# Patient Record
Sex: Male | Born: 1965 | Race: White | Hispanic: No | Marital: Single | Smoking: Never smoker
Health system: Southern US, Community
[De-identification: ages and names within clinical notes are randomized; demographics above are authoritative.]

## PROBLEM LIST (undated history)

## (undated) DIAGNOSIS — Z87898 Personal history of other specified conditions: Secondary | ICD-10-CM

## (undated) DIAGNOSIS — G4700 Insomnia, unspecified: Secondary | ICD-10-CM

## (undated) DIAGNOSIS — R51 Headache: Secondary | ICD-10-CM

## (undated) DIAGNOSIS — R519 Headache, unspecified: Secondary | ICD-10-CM

## (undated) DIAGNOSIS — K219 Gastro-esophageal reflux disease without esophagitis: Secondary | ICD-10-CM

## (undated) DIAGNOSIS — S022XXA Fracture of nasal bones, initial encounter for closed fracture: Secondary | ICD-10-CM

## (undated) DIAGNOSIS — E119 Type 2 diabetes mellitus without complications: Secondary | ICD-10-CM

## (undated) DIAGNOSIS — J309 Allergic rhinitis, unspecified: Secondary | ICD-10-CM

## (undated) DIAGNOSIS — E785 Hyperlipidemia, unspecified: Secondary | ICD-10-CM

## (undated) DIAGNOSIS — K439 Ventral hernia without obstruction or gangrene: Secondary | ICD-10-CM

## (undated) DIAGNOSIS — N400 Enlarged prostate without lower urinary tract symptoms: Secondary | ICD-10-CM

## (undated) DIAGNOSIS — R42 Dizziness and giddiness: Secondary | ICD-10-CM

## (undated) DIAGNOSIS — H7191 Unspecified cholesteatoma, right ear: Secondary | ICD-10-CM

## (undated) DIAGNOSIS — H6690 Otitis media, unspecified, unspecified ear: Secondary | ICD-10-CM

## (undated) DIAGNOSIS — R41840 Attention and concentration deficit: Secondary | ICD-10-CM

## (undated) DIAGNOSIS — R300 Dysuria: Secondary | ICD-10-CM

## (undated) DIAGNOSIS — Z87448 Personal history of other diseases of urinary system: Secondary | ICD-10-CM

## (undated) DIAGNOSIS — F419 Anxiety disorder, unspecified: Secondary | ICD-10-CM

## (undated) DIAGNOSIS — Z87442 Personal history of urinary calculi: Secondary | ICD-10-CM

## (undated) DIAGNOSIS — M1990 Unspecified osteoarthritis, unspecified site: Secondary | ICD-10-CM

## (undated) DIAGNOSIS — Z87828 Personal history of other (healed) physical injury and trauma: Secondary | ICD-10-CM

## (undated) DIAGNOSIS — R55 Syncope and collapse: Secondary | ICD-10-CM

## (undated) DIAGNOSIS — H9190 Unspecified hearing loss, unspecified ear: Secondary | ICD-10-CM

## (undated) DIAGNOSIS — I10 Essential (primary) hypertension: Secondary | ICD-10-CM

## (undated) DIAGNOSIS — F410 Panic disorder [episodic paroxysmal anxiety] without agoraphobia: Secondary | ICD-10-CM

## (undated) HISTORY — PX: MASTOIDECTOMY: SHX711

## (undated) HISTORY — DX: Personal history of other (healed) physical injury and trauma: Z87.828

## (undated) HISTORY — DX: Type 2 diabetes mellitus without complications: E11.9

## (undated) HISTORY — DX: Unspecified cholesteatoma, right ear: H71.91

## (undated) HISTORY — DX: Essential (primary) hypertension: I10

## (undated) HISTORY — PX: NOSE SURGERY: SHX723

## (undated) HISTORY — PX: INNER EAR SURGERY: SHX679

## (undated) HISTORY — PX: CHOLECYSTECTOMY: SHX55

## (undated) HISTORY — DX: Gastro-esophageal reflux disease without esophagitis: K21.9

## (undated) HISTORY — PX: LUMBAR DISC SURGERY: SHX700

## (undated) HISTORY — DX: Hyperlipidemia, unspecified: E78.5

## (undated) HISTORY — PX: OTHER SURGICAL HISTORY: SHX169

## (undated) HISTORY — PX: UPPER GASTROINTESTINAL ENDOSCOPY: SHX188

## (undated) HISTORY — DX: Anxiety disorder, unspecified: F41.9

---
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
## 1999-01-22 ENCOUNTER — Inpatient Hospital Stay (HOSPITAL_COMMUNITY): Admission: EM | Admit: 1999-01-22 | Discharge: 1999-01-24 | Payer: Self-pay | Admitting: *Deleted

## 2000-11-02 ENCOUNTER — Emergency Department (HOSPITAL_COMMUNITY): Admission: EM | Admit: 2000-11-02 | Discharge: 2000-11-03 | Payer: Self-pay | Admitting: *Deleted

## 2000-11-02 ENCOUNTER — Encounter: Payer: Self-pay | Admitting: *Deleted

## 2001-09-29 ENCOUNTER — Encounter: Payer: Self-pay | Admitting: Internal Medicine

## 2001-09-29 ENCOUNTER — Emergency Department (HOSPITAL_COMMUNITY): Admission: EM | Admit: 2001-09-29 | Discharge: 2001-09-29 | Payer: Self-pay | Admitting: Internal Medicine

## 2002-07-25 ENCOUNTER — Encounter: Payer: Self-pay | Admitting: *Deleted

## 2002-07-25 ENCOUNTER — Emergency Department (HOSPITAL_COMMUNITY): Admission: EM | Admit: 2002-07-25 | Discharge: 2002-07-25 | Payer: Self-pay | Admitting: *Deleted

## 2003-05-28 ENCOUNTER — Emergency Department (HOSPITAL_COMMUNITY): Admission: EM | Admit: 2003-05-28 | Discharge: 2003-05-28 | Payer: Self-pay | Admitting: *Deleted

## 2004-02-28 ENCOUNTER — Emergency Department (HOSPITAL_COMMUNITY): Admission: EM | Admit: 2004-02-28 | Discharge: 2004-02-28 | Payer: Self-pay | Admitting: Emergency Medicine

## 2004-03-26 ENCOUNTER — Emergency Department (HOSPITAL_COMMUNITY): Admission: AC | Admit: 2004-03-26 | Discharge: 2004-03-26 | Payer: Self-pay | Admitting: Emergency Medicine

## 2004-04-22 ENCOUNTER — Emergency Department (HOSPITAL_COMMUNITY): Admission: EM | Admit: 2004-04-22 | Discharge: 2004-04-22 | Payer: Self-pay | Admitting: Emergency Medicine

## 2004-08-13 ENCOUNTER — Emergency Department (HOSPITAL_COMMUNITY): Admission: EM | Admit: 2004-08-13 | Discharge: 2004-08-14 | Payer: Self-pay | Admitting: Emergency Medicine

## 2004-09-23 ENCOUNTER — Emergency Department (HOSPITAL_COMMUNITY): Admission: EM | Admit: 2004-09-23 | Discharge: 2004-09-24 | Payer: Self-pay | Admitting: *Deleted

## 2004-11-07 ENCOUNTER — Emergency Department (HOSPITAL_COMMUNITY): Admission: EM | Admit: 2004-11-07 | Discharge: 2004-11-07 | Payer: Self-pay | Admitting: Emergency Medicine

## 2004-12-31 ENCOUNTER — Emergency Department (HOSPITAL_COMMUNITY): Admission: EM | Admit: 2004-12-31 | Discharge: 2004-12-31 | Payer: Self-pay | Admitting: Emergency Medicine

## 2005-03-02 ENCOUNTER — Ambulatory Visit (HOSPITAL_COMMUNITY): Payer: Self-pay | Admitting: Psychiatry

## 2005-03-02 ENCOUNTER — Ambulatory Visit: Payer: Self-pay | Admitting: Psychiatry

## 2005-04-30 ENCOUNTER — Emergency Department (HOSPITAL_COMMUNITY): Admission: EM | Admit: 2005-04-30 | Discharge: 2005-04-30 | Payer: Self-pay | Admitting: Emergency Medicine

## 2005-10-27 ENCOUNTER — Observation Stay (HOSPITAL_COMMUNITY): Admission: AD | Admit: 2005-10-27 | Discharge: 2005-10-28 | Payer: Self-pay | Admitting: Cardiology

## 2005-10-27 ENCOUNTER — Ambulatory Visit: Payer: Self-pay | Admitting: Cardiology

## 2005-10-27 ENCOUNTER — Ambulatory Visit: Payer: Self-pay | Admitting: Cardiovascular Disease

## 2005-11-04 ENCOUNTER — Encounter: Payer: Self-pay | Admitting: Vascular Surgery

## 2005-11-04 ENCOUNTER — Emergency Department (HOSPITAL_COMMUNITY): Admission: EM | Admit: 2005-11-04 | Discharge: 2005-11-04 | Payer: Self-pay | Admitting: Emergency Medicine

## 2006-05-07 ENCOUNTER — Emergency Department (HOSPITAL_COMMUNITY): Admission: EM | Admit: 2006-05-07 | Discharge: 2006-05-07 | Payer: Self-pay | Admitting: Emergency Medicine

## 2006-07-29 ENCOUNTER — Emergency Department (HOSPITAL_COMMUNITY): Admission: EM | Admit: 2006-07-29 | Discharge: 2006-07-29 | Payer: Self-pay | Admitting: Emergency Medicine

## 2006-08-01 ENCOUNTER — Emergency Department (HOSPITAL_COMMUNITY): Admission: EM | Admit: 2006-08-01 | Discharge: 2006-08-01 | Payer: Self-pay | Admitting: Emergency Medicine

## 2006-08-27 ENCOUNTER — Emergency Department (HOSPITAL_COMMUNITY): Admission: EM | Admit: 2006-08-27 | Discharge: 2006-08-27 | Payer: Self-pay | Admitting: Emergency Medicine

## 2006-08-31 ENCOUNTER — Emergency Department (HOSPITAL_COMMUNITY): Admission: EM | Admit: 2006-08-31 | Discharge: 2006-08-31 | Payer: Self-pay | Admitting: Emergency Medicine

## 2006-11-23 ENCOUNTER — Emergency Department (HOSPITAL_COMMUNITY): Admission: EM | Admit: 2006-11-23 | Discharge: 2006-11-23 | Payer: Self-pay | Admitting: Emergency Medicine

## 2007-01-12 ENCOUNTER — Emergency Department (HOSPITAL_COMMUNITY): Admission: EM | Admit: 2007-01-12 | Discharge: 2007-01-12 | Payer: Self-pay | Admitting: Emergency Medicine

## 2007-01-18 ENCOUNTER — Emergency Department (HOSPITAL_COMMUNITY): Admission: EM | Admit: 2007-01-18 | Discharge: 2007-01-18 | Payer: Self-pay | Admitting: Emergency Medicine

## 2007-03-12 ENCOUNTER — Ambulatory Visit (HOSPITAL_COMMUNITY): Admission: RE | Admit: 2007-03-12 | Discharge: 2007-03-12 | Payer: Self-pay | Admitting: Orthopaedic Surgery

## 2007-08-27 ENCOUNTER — Emergency Department (HOSPITAL_COMMUNITY): Admission: EM | Admit: 2007-08-27 | Discharge: 2007-08-27 | Payer: Self-pay | Admitting: Emergency Medicine

## 2008-01-25 ENCOUNTER — Emergency Department (HOSPITAL_COMMUNITY): Admission: EM | Admit: 2008-01-25 | Discharge: 2008-01-25 | Payer: Self-pay | Admitting: Emergency Medicine

## 2008-02-28 ENCOUNTER — Emergency Department (HOSPITAL_COMMUNITY): Admission: EM | Admit: 2008-02-28 | Discharge: 2008-02-28 | Payer: Self-pay | Admitting: Emergency Medicine

## 2008-04-10 ENCOUNTER — Emergency Department (HOSPITAL_COMMUNITY): Admission: EM | Admit: 2008-04-10 | Discharge: 2008-04-10 | Payer: Self-pay | Admitting: Emergency Medicine

## 2008-06-10 ENCOUNTER — Emergency Department (HOSPITAL_COMMUNITY): Admission: EM | Admit: 2008-06-10 | Discharge: 2008-06-10 | Payer: Self-pay | Admitting: Emergency Medicine

## 2008-07-05 ENCOUNTER — Ambulatory Visit (HOSPITAL_COMMUNITY): Admission: RE | Admit: 2008-07-05 | Discharge: 2008-07-05 | Payer: Self-pay | Admitting: Internal Medicine

## 2008-07-20 ENCOUNTER — Emergency Department (HOSPITAL_COMMUNITY): Admission: EM | Admit: 2008-07-20 | Discharge: 2008-07-20 | Payer: Self-pay | Admitting: Emergency Medicine

## 2008-09-07 ENCOUNTER — Emergency Department (HOSPITAL_COMMUNITY): Admission: EM | Admit: 2008-09-07 | Discharge: 2008-09-07 | Payer: Self-pay | Admitting: Emergency Medicine

## 2008-11-14 ENCOUNTER — Emergency Department (HOSPITAL_COMMUNITY): Admission: EM | Admit: 2008-11-14 | Discharge: 2008-11-14 | Payer: Self-pay | Admitting: Emergency Medicine

## 2008-12-28 ENCOUNTER — Emergency Department (HOSPITAL_COMMUNITY): Admission: EM | Admit: 2008-12-28 | Discharge: 2008-12-28 | Payer: Self-pay | Admitting: Emergency Medicine

## 2009-01-25 ENCOUNTER — Emergency Department (HOSPITAL_COMMUNITY): Admission: EM | Admit: 2009-01-25 | Discharge: 2009-01-25 | Payer: Self-pay | Admitting: Emergency Medicine

## 2009-06-28 ENCOUNTER — Emergency Department (HOSPITAL_COMMUNITY): Admission: EM | Admit: 2009-06-28 | Discharge: 2009-06-28 | Payer: Self-pay | Admitting: Emergency Medicine

## 2009-07-08 ENCOUNTER — Ambulatory Visit (HOSPITAL_COMMUNITY): Admission: RE | Admit: 2009-07-08 | Discharge: 2009-07-08 | Payer: Self-pay | Admitting: Family Medicine

## 2010-01-25 ENCOUNTER — Ambulatory Visit (HOSPITAL_COMMUNITY): Admission: RE | Admit: 2010-01-25 | Discharge: 2010-01-25 | Payer: Self-pay | Admitting: Orthopedic Surgery

## 2010-03-03 ENCOUNTER — Emergency Department (HOSPITAL_COMMUNITY)
Admission: EM | Admit: 2010-03-03 | Discharge: 2010-03-03 | Payer: Self-pay | Source: Home / Self Care | Admitting: Emergency Medicine

## 2010-07-05 ENCOUNTER — Emergency Department (HOSPITAL_COMMUNITY)
Admission: EM | Admit: 2010-07-05 | Discharge: 2010-07-06 | Disposition: A | Discharge status: Home / Self Care | Payer: 59 | Attending: Emergency Medicine | Admitting: Emergency Medicine

## 2010-07-05 DIAGNOSIS — E7800 Pure hypercholesterolemia, unspecified: Secondary | ICD-10-CM | POA: Insufficient documentation

## 2010-07-05 DIAGNOSIS — K219 Gastro-esophageal reflux disease without esophagitis: Secondary | ICD-10-CM | POA: Insufficient documentation

## 2010-07-05 DIAGNOSIS — R55 Syncope and collapse: Secondary | ICD-10-CM | POA: Insufficient documentation

## 2010-07-05 LAB — POCT I-STAT, CHEM 8
Calcium, Ion: 1.11 mmol/L — ABNORMAL LOW (ref 1.12–1.32)
Chloride: 110 mEq/L (ref 96–112)
Glucose, Bld: 187 mg/dL — ABNORMAL HIGH (ref 70–99)
HCT: 47 % (ref 39.0–52.0)
TCO2: 23 mmol/L (ref 0–100)

## 2010-07-05 LAB — BASIC METABOLIC PANEL
BUN: 8 mg/dL (ref 6–23)
CO2: 28 mEq/L (ref 19–32)
Calcium: 9.3 mg/dL (ref 8.4–10.5)
Chloride: 107 mEq/L (ref 96–112)
Creatinine, Ser: 1.24 mg/dL (ref 0.4–1.5)
GFR calc Af Amer: >60 mL/min (ref >60)
GFR calc non Af Amer: >60 mL/min (ref >60)
Glucose, Bld: 174 mg/dL — ABNORMAL HIGH (ref 70–99)
Potassium: 3.8 mEq/L (ref 3.5–5.1)
Sodium: 139 mEq/L (ref 135–145)

## 2010-07-05 LAB — DIFFERENTIAL
Basophils Absolute: 0 10*3/uL (ref 0.0–0.1)
Basophils Relative: 0 % (ref 0–1)
Eosinophils Absolute: 0.1 10*3/uL (ref 0.0–0.7)
Eosinophils Relative: 1 % (ref 0–5)
Lymphocytes Relative: 28 % (ref 12–46)
Lymphs Abs: 1.3 10*3/uL (ref 0.7–4.0)
Monocytes Absolute: 0.3 10*3/uL (ref 0.1–1.0)
Monocytes Relative: 7 % (ref 3–12)
Neutro Abs: 2.9 10*3/uL (ref 1.7–7.7)
Neutrophils Relative %: 63 % (ref 43–77)

## 2010-07-05 LAB — CBC
HCT: 44.8 % (ref 39.0–52.0)
HCT: 45.8 % (ref 39.0–52.0)
Hemoglobin: 16.1 g/dL (ref 13.0–17.0)
Hemoglobin: 16.2 g/dL (ref 13.0–17.0)
MCHC: 35.3 g/dL (ref 30.0–36.0)
MCHC: 35.9 g/dL (ref 30.0–36.0)
MCV: 83.6 fL (ref 78.0–100.0)
MCV: 84.7 fL (ref 78.0–100.0)
Platelets: 174 10*3/uL (ref 150–400)
RBC: 5.41 MIL/uL (ref 4.22–5.81)
RDW: 12.4 % (ref 11.5–15.5)
RDW: 12.8 % (ref 11.5–15.5)
WBC: 4.7 10*3/uL (ref 4.0–10.5)

## 2010-07-05 LAB — POCT CARDIAC MARKERS
CKMB, poc: <1 ng/mL — ABNORMAL LOW (ref 1.0–8.0)
CKMB, poc: <1 ng/mL — ABNORMAL LOW (ref 1.0–8.0)
Myoglobin, poc: 68.4 ng/mL (ref 12–200)
Myoglobin, poc: 90 ng/mL (ref 12–200)
Troponin i, poc: <0.05 ng/mL (ref 0.00–0.09)
Troponin i, poc: <0.05 ng/mL (ref 0.00–0.09)
Troponin i, poc: <0.05 ng/mL (ref 0.00–0.09)

## 2010-07-06 LAB — BASIC METABOLIC PANEL
BUN: 15 mg/dL (ref 6–23)
CO2: 24 mEq/L (ref 19–32)
Calcium: 8.7 mg/dL (ref 8.4–10.5)
Glucose, Bld: 203 mg/dL — ABNORMAL HIGH (ref 70–99)
Potassium: 4.1 mEq/L (ref 3.5–5.1)
Sodium: 141 mEq/L (ref 135–145)

## 2010-07-07 LAB — URINALYSIS, ROUTINE W REFLEX MICROSCOPIC
Hgb urine dipstick: NEGATIVE
Specific Gravity, Urine: 1.025 (ref 1.005–1.030)
pH: 6 (ref 5.0–8.0)

## 2010-07-14 LAB — PROTEIN ELECTROPH W RFLX QUANT IMMUNOGLOBULINS
Albumin ELP: 58.5 % (ref 55.8–66.1)
Alpha-1-Globulin: 3.7 % (ref 2.9–4.9)
Alpha-2-Globulin: 8.6 % (ref 7.1–11.8)
Beta 2: 5.7 % (ref 3.2–6.5)

## 2010-07-14 LAB — DIFFERENTIAL
Basophils Relative: 0 % (ref 0–1)
Eosinophils Absolute: 0.1 10*3/uL (ref 0.0–0.7)
Eosinophils Relative: 1 % (ref 0–5)
Monocytes Absolute: 0.3 10*3/uL (ref 0.1–1.0)
Monocytes Relative: 6 % (ref 3–12)

## 2010-07-14 LAB — CBC
Hemoglobin: 16.5 g/dL (ref 13.0–17.0)
MCHC: 34.4 g/dL (ref 30.0–36.0)
MCV: 84.9 fL (ref 78.0–100.0)
RBC: 5.67 MIL/uL (ref 4.22–5.81)
RDW: 13.4 % (ref 11.5–15.5)

## 2010-07-14 LAB — POCT CARDIAC MARKERS: Troponin i, poc: <0.05 ng/mL (ref 0.00–0.09)

## 2010-07-14 LAB — POCT I-STAT, CHEM 8
BUN: 9 mg/dL (ref 6–23)
Chloride: 105 mEq/L (ref 96–112)
Creatinine, Ser: 1.1 mg/dL (ref 0.4–1.5)
Glucose, Bld: 204 mg/dL — ABNORMAL HIGH (ref 70–99)
HCT: 50 % (ref 39.0–52.0)
Potassium: 4 mEq/L (ref 3.5–5.1)

## 2010-08-04 ENCOUNTER — Other Ambulatory Visit (HOSPITAL_COMMUNITY): Payer: Self-pay | Admitting: Internal Medicine

## 2010-08-04 DIAGNOSIS — R52 Pain, unspecified: Secondary | ICD-10-CM

## 2010-08-05 ENCOUNTER — Inpatient Hospital Stay (HOSPITAL_COMMUNITY): Admission: RE | Admit: 2010-08-05 | Payer: 59 | Source: Ambulatory Visit

## 2010-08-05 ENCOUNTER — Ambulatory Visit (HOSPITAL_COMMUNITY): Payer: 59

## 2010-08-05 ENCOUNTER — Ambulatory Visit (HOSPITAL_COMMUNITY)
Admission: RE | Admit: 2010-08-05 | Discharge: 2010-08-05 | Disposition: A | Discharge status: Home / Self Care | Payer: 59 | Source: Ambulatory Visit | Attending: Internal Medicine | Admitting: Internal Medicine

## 2010-08-05 DIAGNOSIS — R52 Pain, unspecified: Secondary | ICD-10-CM

## 2010-08-05 DIAGNOSIS — R109 Unspecified abdominal pain: Secondary | ICD-10-CM | POA: Insufficient documentation

## 2010-08-08 ENCOUNTER — Other Ambulatory Visit (HOSPITAL_COMMUNITY): Payer: 59

## 2010-08-15 NOTE — Letter (Signed)
November 06, 2005     Dr. Fara Chute  [address]  [place], [place] [address].   RE:  [name], Jacob  MRN:  [id number]  /  DOB:  [birth year]   Dear Dr. Neita Carp:   Jacob [name] was seen in the office today.  I regret that he has had so much  trouble following a negative cardiac catheterization.  By his history, he  bled the evening after his catheterization when he was sitting down to  dinner.  The pain increased over the next day or two but did not stop him  from performing fairly strenuous activities.  When he tried to go to work a  few days ago, he had increased pain prompting an ER visit.  Vascular  ultrasound at that time was negative.  He has felt somewhat better in the  intervening two days, but continues to have significant tenderness and  discomfort with activity.   EXAM:  The weight is 178, blood pressure 120/75, heart rate 75 and regular.  In the right inguinal region, the skin puncture site is completely closed.  There is bruising extending down the thigh.  He has a tender mass with a  diameter of approximately 1-1/2 inches in the soft tissue medial to the  incision.  The arterial pulse is normal.   IMPRESSION:  Jacob [name] suffered a re-bleed from his catheterization site on  the day after the procedure was performed.  The blood tracked medially and  is now causing discomfort.  I doubt  that there will be any permanent sequelae.  He requests return to work on  Monday, which is fine with me.  He will use analgesics and moist heat to  assist with his symptoms.  I will reassess him in one week.    Sincerely,      Gerrit Friends. Dietrich Pates, MD, [facility]   RMR/MedQ  DD:  11/06/2005  DT:  11/06/2005  Job #:  [id number]

## 2010-08-15 NOTE — Cardiovascular Report (Signed)
NAMEOM, LIZOTTE NO.:  [id number]   MEDICAL RECORD NO.:  [id number]          PATIENT TYPE:  INP   LOCATION:  3728                         FACILITY:  [facility]   PHYSICIAN:  Micheline Chapman, MD   DATE OF BIRTH:  1965-10-09   DATE OF PROCEDURE:  10/27/2005  DATE OF DISCHARGE:                              CARDIAC CATHETERIZATION   PERFORMING PHYSICIAN:  Micheline Chapman, M.D.   PROCTORING PHYSICIAN:  Charlton Haws, M.D.   INDICATIONS:  Mr. Snellings is a 45 year old male with no prior cardiac history  but has a past history of diabetes, dyslipidemia and strong family history  presenting with the new onset of resting chest tightness with a borderline  troponin from the outside hospital of 0.08. He was, therefore, referred for  cardiac catheterization to definitively evaluate his coronary arteries.   PROCEDURES:  1.  Left heart catheterization.  2.  Coronary angiogram.  3.  Left ventricular angiogram.   The indications and risks of the procedure were explained fully to the  patient.  He understood, and informed consent was obtained.  He was brought  to the cardiac catheterization lab where the right groin was prepped and  draped in normal sterile fashion.  Local anesthesia with 1% lidocaine was  used.  The patient was given 2 mg Versed and 50 mcg fentanyl for conscious  sedation.  Using the modified Seldinger technique, a 6-French right femoral  arterial sheath was placed without problems.  Catheters used included a 6-  French angled pigtail, JR4 and JL4.  At the conclusion of the case, the  right femoral artery was sealed with a 6-French Angioseal closure device.   FINDINGS:  Aortic pressure was 93/68 mmHg.  Left ventricular pressure was  91/4 mmHg with an left ventricular end-diastolic pressure of 10 mmHg.   Left coronary artery: The left mainstem is normal.   LAD is a large diameter vessel that gives off a large septal perforator as  well as a large diameter  branching diagonal.  There is no angiographic  disease in the LAD.   The left circumflex gives off 3 very small obtuse marginal branches, then  gives off a large OM that has no angiographic disease.  The remainder of the  AV groove circumflex vessel has no disease.   Right coronary artery is a dominant vessel branching into a PDA and  posterior AV segment that gives off 2 posterolateral branches.  There is no  angiographic disease in the right coronary artery system.   Left ventricular systolic function is normal, and ejection fraction is  estimated at 55%.  This is by estimation from an ROA LV angiogram.   ASSESSMENT:  1.  Chest pain with normal coronary arteries.  2.  Normal left ventricular function.   PLAN:  Risk factor modification with treatment of his diabetes and  hyperlipidemia.  Plan on discharge home tomorrow.   COMPLICATIONS:  None.      Micheline Chapman, MD  Electronically Signed     MDC/MEDQ  D:  10/27/2005  T:  10/27/2005  Job:  [id number]   cc:   Fara Chute, M.D.  Willa Rough, M.D.

## 2010-08-15 NOTE — Procedures (Signed)
[facility]  [name], Jacob [name] Visit Number: [id number] MRN: [id number]          Service Type: EMS Location: ED Attending Physician:  Cassell Smiles. Dictated by:   Kari Baars, M.D. Proc. Date: 09/29/01 Admit Date:  09/29/2001 Discharge Date: 09/29/2001                            EKG Interpretations  The rhythm is a sinus rhythm with a bradycardic rate in the 50s.  Small Q-waves are seen inferiorly and clinical correlation is suggested.  Otherwise normal electrocardiogram. Dictated by:   Kari Baars, M.D. Attending Physician:  Cassell Smiles DD:  09/30/01 TD:  10/04/01 Job: [id number]

## 2010-08-15 NOTE — Discharge Summary (Signed)
NAMENYKO, GELL NO.:  [id number]   MEDICAL RECORD NO.:  [id number]          PATIENT TYPE:  INP   LOCATION:  3728                         FACILITY:  [facility]   PHYSICIAN:  Micheline Chapman, MD   DATE OF BIRTH:  13-Feb-1966   DATE OF ADMISSION:  10/27/2005  DATE OF DISCHARGE:  10/28/2005                                 DISCHARGE SUMMARY   PROCEDURES:  1.  Cardiac catheterization.  2.  Coronary arteriogram.  3.  Left ventriculogram.   TIME OF DISCHARGE:  Thirty-three minutes.   CHIEF COMPLAINT:  1.  Chest pain, clean cath this admission with an EF or 55%,.  2.  Diabetes, diet-controlled.  3.  Hyperlipidemia.  4.  Past history of coronary artery disease.  5.  Depression and panic attacks.  6.  History of right ear tumor surgeries x3.  7.  Status post lumbar laminectomy.  8.  Status post cholecystectomy.  9.  Gastroesophageal reflux disease.   HOSPITAL COURSE:  Mr. Edelson is an 45 year old male with no previous history of  coronary artery disease.  He has been under increased stress recently with  family issues.  He developed chest pain.  He presented to [facility]  and he had a borderline troponin of 0.8.  He was evaluated by Cardiology and  because of his multiple cardiac risk factors, was transferred to [facility] for further evaluation and treatment.   It was felt that cardiac catheterization was indicated to further define his  anatomy and this was performed on October 27, 2005.  His scores were normal and  his EF was normal at 555 with no wall motion abnormality.  It was felt that  he needed risk factor modification.   Mr. Simonin stated that he is to trying to wean himself off the Wellbutrin, but  he as encouraged to take it on a daily basis and follow up with Dr. Neita Carp  if his symptoms of anxiety and stress were not controlled.  He had no  further episodes of chest pain.  He stated that his hemoglobin A1c had been  approximately 6  recently, which indicates good control, but he had recently  been eating for carbohydrates and sugared sodas than he had been doing  previously.  He was encouraged to stick tightly to a diabetic diet and  continue to check his sugars and follow up with Dr. Neita Carp.   On October 28, 2005 Mr. Juniel was without chest pain or shortness of breath.  His cath site was without ecchymosis or hematoma.  He was considered stable  for discharge on October 28, 2005 and is to follow up with his primary care  physician.   DISCHARGE INSTRUCTIONS:  1.  His activity level is to be reduced for the next 2 day.  2.  He is to call our office for any problems with the cath site.  3.  He is to stick to a diabetic diet.  4.  He is to see Dr. Neita Carp within 2 weeks and see Dr. Andee Lineman on a p.r.n.  basis.   DISCHARGE MEDICATIONS:  1.  Wellbutrin 150 mg daily.  2.  Xanax p.r.n.  3.  Prilosec OTC one to two tablets b.i.d. for a week and then one tab      daily.  (The patient has done well on Nexium in the past, but is having      difficulty affording this).      Theodore Demark, P.A. [facility]      Micheline Chapman, MD  Electronically Signed    RB/MEDQ  D:  10/28/2005  T:  10/28/2005  Job:  [id number]   cc:   Sabino Donovan [facility]

## 2010-12-24 LAB — BASIC METABOLIC PANEL
BUN: 12
Creatinine, Ser: 1.29
GFR calc Af Amer: >60
GFR calc non Af Amer: >60
Potassium: 4.7

## 2010-12-24 LAB — CBC
HCT: 45
Platelets: 201
WBC: 6.7

## 2010-12-24 LAB — POCT CARDIAC MARKERS
Myoglobin, poc: 60.3
Operator id: 282261
Operator id: 282261
Troponin i, poc: <0.05

## 2010-12-24 LAB — DIFFERENTIAL
Lymphocytes Relative: 17
Lymphs Abs: 1.1
Neutrophils Relative %: 75

## 2010-12-24 LAB — D-DIMER, QUANTITATIVE: D-Dimer, Quant: 0.22

## 2010-12-29 LAB — COMPREHENSIVE METABOLIC PANEL
Albumin: 3.9
BUN: 9
Creatinine, Ser: 1.12
Total Bilirubin: 1
Total Protein: 7

## 2010-12-29 LAB — DIFFERENTIAL
Basophils Absolute: 0
Lymphocytes Relative: 24
Monocytes Absolute: 0.2
Monocytes Relative: 5
Neutro Abs: 3

## 2010-12-29 LAB — CBC
HCT: 48.3
MCV: 84.4
Platelets: 192
RDW: 13.4

## 2010-12-29 LAB — POCT CARDIAC MARKERS: Troponin i, poc: <0.05

## 2011-01-07 LAB — CBC
HCT: 44.3
MCV: 84.3
RBC: 5.25
WBC: 4.3

## 2011-01-07 LAB — DIFFERENTIAL
Eosinophils Absolute: 0.1
Eosinophils Relative: 1
Lymphs Abs: 1.1
Monocytes Relative: 9

## 2011-01-07 LAB — BASIC METABOLIC PANEL
Chloride: 108
GFR calc Af Amer: >60
Potassium: 3.6

## 2011-01-07 LAB — PROTIME-INR: Prothrombin Time: 13.7

## 2011-01-09 LAB — CBC
Hemoglobin: 16
MCHC: 34.3
RBC: 5.52

## 2011-01-09 LAB — DIFFERENTIAL
Eosinophils Relative: 1
Lymphocytes Relative: 26
Monocytes Absolute: 0.5
Monocytes Relative: 7
Neutrophils Relative %: 66

## 2011-01-09 LAB — COMPREHENSIVE METABOLIC PANEL
ALT: 30
Alkaline Phosphatase: 73
CO2: 30
Calcium: 9.3
Chloride: 109
GFR calc non Af Amer: >60
Glucose, Bld: 140 — ABNORMAL HIGH
Sodium: 141
Total Bilirubin: 1

## 2011-01-09 LAB — POCT CARDIAC MARKERS
CKMB, poc: 2
Troponin i, poc: <0.05

## 2011-01-15 LAB — RAPID STREP SCREEN (MED CTR MEBANE ONLY): Streptococcus, Group A Screen (Direct): NEGATIVE

## 2011-01-15 LAB — STREP A DNA PROBE

## 2011-01-22 ENCOUNTER — Emergency Department (HOSPITAL_COMMUNITY): Payer: Self-pay

## 2011-01-22 ENCOUNTER — Emergency Department (HOSPITAL_COMMUNITY)
Admission: EM | Admit: 2011-01-22 | Discharge: 2011-01-23 | Disposition: A | Discharge status: Home / Self Care | Payer: Self-pay | Attending: Emergency Medicine | Admitting: Emergency Medicine

## 2011-01-22 DIAGNOSIS — R6883 Chills (without fever): Secondary | ICD-10-CM | POA: Insufficient documentation

## 2011-01-22 DIAGNOSIS — E7800 Pure hypercholesterolemia, unspecified: Secondary | ICD-10-CM | POA: Insufficient documentation

## 2011-01-22 DIAGNOSIS — Z79899 Other long term (current) drug therapy: Secondary | ICD-10-CM | POA: Insufficient documentation

## 2011-01-22 DIAGNOSIS — K219 Gastro-esophageal reflux disease without esophagitis: Secondary | ICD-10-CM | POA: Insufficient documentation

## 2011-01-22 DIAGNOSIS — G8929 Other chronic pain: Secondary | ICD-10-CM | POA: Insufficient documentation

## 2011-01-22 DIAGNOSIS — R110 Nausea: Secondary | ICD-10-CM | POA: Insufficient documentation

## 2011-01-22 DIAGNOSIS — R059 Cough, unspecified: Secondary | ICD-10-CM | POA: Insufficient documentation

## 2011-01-22 DIAGNOSIS — R079 Chest pain, unspecified: Secondary | ICD-10-CM | POA: Insufficient documentation

## 2011-01-22 DIAGNOSIS — M549 Dorsalgia, unspecified: Secondary | ICD-10-CM | POA: Insufficient documentation

## 2011-01-22 DIAGNOSIS — R05 Cough: Secondary | ICD-10-CM | POA: Insufficient documentation

## 2011-01-22 LAB — POCT I-STAT, CHEM 8
Calcium, Ion: 1.16 mmol/L (ref 1.12–1.32)
Creatinine, Ser: 1.3 mg/dL (ref 0.50–1.35)
Glucose, Bld: 148 mg/dL — ABNORMAL HIGH (ref 70–99)
Hemoglobin: 16.3 g/dL (ref 13.0–17.0)
TCO2: 25 mmol/L (ref 0–100)

## 2011-01-23 LAB — POCT I-STAT TROPONIN I: Troponin i, poc: 0 ng/mL (ref 0.00–0.08)

## 2011-01-25 IMAGING — CR DG ORBITS FOR FOREIGN BODY
1 series · 1 of 1 positions shown · non-contrast
Comparison: CT 06/28/2009

CLINICAL DATA: Metal working/exposure; clearance prior to MRI

ORBITS FOR FOREIGN BODY - 2 VIEW

[view not recorded]
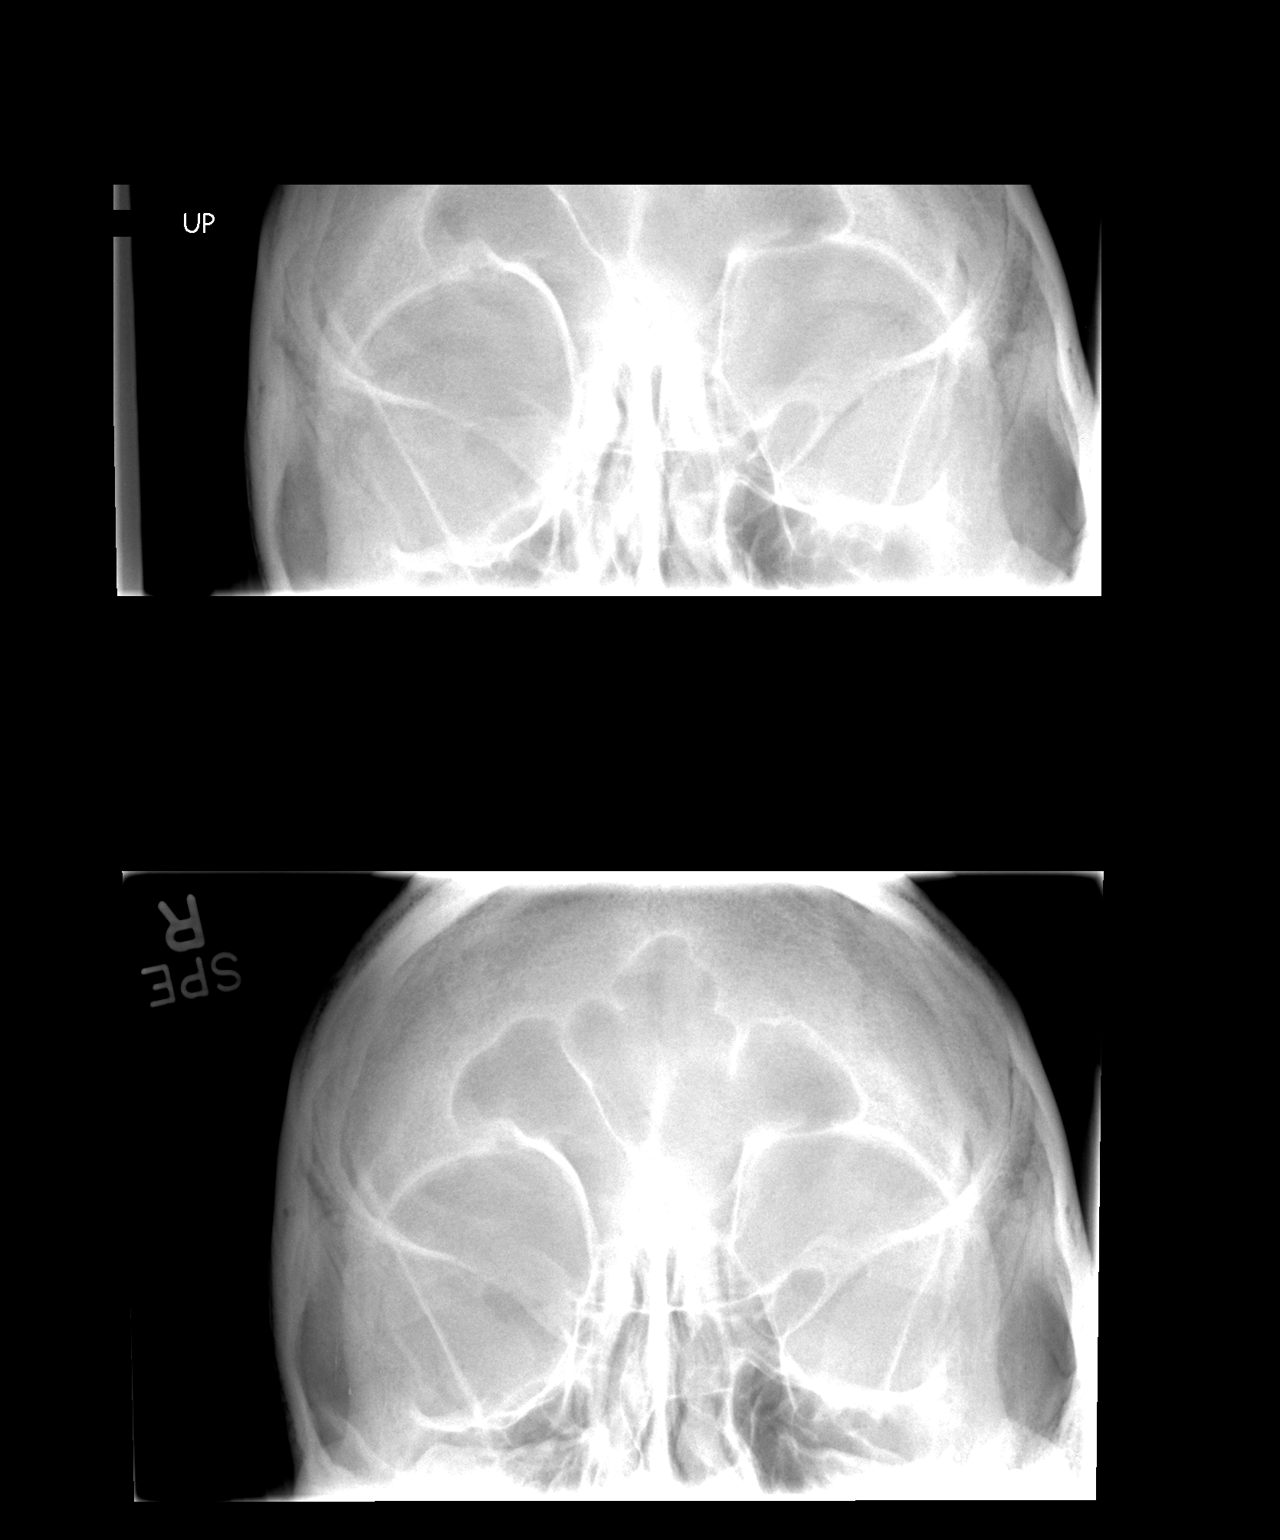

[1 of 1 positions shown; findings below may reference images not displayed]

FINDINGS: There is no evidence of metallic foreign body within the
orbits.  No significant bone abnormality identified.
IMPRESSION: No evidence of metallic foreign body within the orbits.

## 2011-10-21 ENCOUNTER — Ambulatory Visit (INDEPENDENT_AMBULATORY_CARE_PROVIDER_SITE_OTHER): Payer: 59 | Admitting: Family Medicine

## 2011-10-21 VITALS — BP 124/66 | HR 62 | Temp 97.5°F | Resp 16 | Ht 70.5 in | Wt 187.0 lb

## 2011-10-21 DIAGNOSIS — H60399 Other infective otitis externa, unspecified ear: Secondary | ICD-10-CM

## 2011-10-21 DIAGNOSIS — R42 Dizziness and giddiness: Secondary | ICD-10-CM

## 2011-10-21 DIAGNOSIS — R51 Headache: Secondary | ICD-10-CM

## 2011-10-21 DIAGNOSIS — H6090 Unspecified otitis externa, unspecified ear: Secondary | ICD-10-CM

## 2011-10-21 DIAGNOSIS — K219 Gastro-esophageal reflux disease without esophagitis: Secondary | ICD-10-CM

## 2011-10-21 MED ORDER — CIPROFLOXACIN-DEXAMETHASONE 0.3-0.1 % OT SUSP: 4.0000 [drp] | Freq: Two times a day (BID) | OTIC | Status: AC

## 2011-10-21 MED ORDER — BUTALBITAL-APAP-CAFFEINE 50-325-40 MG PO TABS: 1.0000 | ORAL_TABLET | Freq: Four times a day (QID) | ORAL | Status: DC | PRN
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
## 2011-11-14 ENCOUNTER — Ambulatory Visit (INDEPENDENT_AMBULATORY_CARE_PROVIDER_SITE_OTHER): Payer: 59 | Admitting: Emergency Medicine

## 2011-11-14 VITALS — BP 138/82 | HR 61 | Temp 97.9°F | Resp 16 | Ht 71.5 in | Wt 189.0 lb

## 2011-11-14 DIAGNOSIS — R109 Unspecified abdominal pain: Secondary | ICD-10-CM

## 2011-11-14 DIAGNOSIS — R1310 Dysphagia, unspecified: Secondary | ICD-10-CM

## 2011-11-14 DIAGNOSIS — S335XXA Sprain of ligaments of lumbar spine, initial encounter: Secondary | ICD-10-CM

## 2011-11-14 DIAGNOSIS — H66009 Acute suppurative otitis media without spontaneous rupture of ear drum, unspecified ear: Secondary | ICD-10-CM

## 2011-11-14 LAB — POCT UA - MICROSCOPIC ONLY
Bacteria, U Microscopic: NEGATIVE
Crystals, Ur, HPF, POC: NEGATIVE
Mucus, UA: NEGATIVE
RBC, urine, microscopic: NEGATIVE

## 2011-11-14 MED ORDER — CELECOXIB 200 MG PO CAPS: 200.0000 mg | ORAL_CAPSULE | Freq: Two times a day (BID) | ORAL | Status: AC

## 2011-11-14 MED ORDER — SUCRALFATE 1 G PO TABS: 1.0000 g | ORAL_TABLET | Freq: Four times a day (QID) | ORAL | Status: DC

## 2011-11-14 MED ORDER — AMOXICILLIN 875 MG PO TABS: 875.0000 mg | ORAL_TABLET | Freq: Two times a day (BID) | ORAL | Status: AC

## 2011-11-14 MED ORDER — CYCLOBENZAPRINE HCL 10 MG PO TABS: 10.0000 mg | ORAL_TABLET | Freq: Three times a day (TID) | ORAL | Status: AC | PRN

## 2011-11-14 MED ORDER — LANSOPRAZOLE 30 MG PO CPDR: 30.0000 mg | DELAYED_RELEASE_CAPSULE | Freq: Every day | ORAL | Status: DC

## 2011-11-14 NOTE — Progress Notes (Signed)
Date:  11/14/2011   Name:  Jacob [name]   DOB:  1965/07/17   MRN:  [id number] Gender: male  Age: 46 y.o.  PCP:  No primary provider on file.    Chief Complaint: Groin Pain, Otalgia and Sore Throat   History of Present Illness:  Jacob [name] is a 46 y.o. pleasant patient who presents with the following:  Multiple complaints.  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  Has right flank/low back pain over past week. No history of injury.  Radiates into his right groin.  No dysuria, hematuria, nausea or vomiting.  History of prior kidney stone and laminectomy.  No fever or chills.  No stool change.    Complains of pain in his left ear over past few days associated with sore throat.  No URI symptoms, cough, fever or chills.  Some dysphagia.  History of right surgery for cholesteatoma.  Has reflux esophagitis and only sporadically takes his PPI as he can't afford it daily.  Has noticed increasing symptoms of pain and waterbrash.  Says that she has increasing difficulty swallowing his food feels as though it is sticking in his throat  There is no problem list on file for this patient.   Past Medical History  Diagnosis Date  . Cholesteatoma of right ear   . Anxiety   . GERD (gastroesophageal reflux disease)     Past Surgical History  Procedure Date  . External ear surgery   . Spine surgery     History  Substance Use Topics  . Smoking status: Never Smoker   . Smokeless tobacco: Not on file  . Alcohol Use: Yes    Family History  Problem Relation Age of Onset  . COPD Mother   . Diabetes Mother   . Hypertension Mother   . Heart disease Father     No Known Allergies  Medication list has been reviewed and updated.  Current Outpatient Prescriptions on File Prior to Visit  Medication Sig Dispense Refill  . ALPRAZolam (XANAX) 0.5 MG tablet Take 0.5 mg by mouth at bedtime as needed.      Marland Kitchen esomeprazole (NEXIUM) 40 MG capsule  Take 40 mg by mouth as needed.      . sildenafil (VIAGRA) 50 MG tablet Take 50 mg by mouth daily as needed.      . butalbital-acetaminophen-caffeine (FIORICET) 50-325-40 MG per tablet Take 1-2 tablets by mouth every 6 (six) hours as needed for headache.  30 tablet  0    Review of Systems:  As per HPI, otherwise negative.    Physical Examination: Filed Vitals:   11/14/11 1645  BP: 138/82  Pulse: 61  Temp: 97.9 F (36.6 C)  Resp: 16   Filed Vitals:   11/14/11 1645  Height: 5' 11.5" (1.816 m)  Weight: 189 lb (85.73 kg)   Body mass index is 25.99 kg/(m^2). Ideal Body Weight: Weight in (lb) to have BMI = 25: 181.4   GEN: WDWN, NAD, Non-toxic, A & O x 3 HEENT: Atraumatic, Normocephalic. Neck supple. No masses, No LAD.  Throat red, no injection or exudate Ears and Nose: No external deformity.  Left TM dull and red.   CV: RRR, No M/G/R. No JVD. No thrill. No extra heart sounds. PULM: CTA B, no wheezes, crackles, rhonchi. No retractions. No resp. distress. No accessory muscle use. ABD: S, NT, ND, +BS. No rebound. No HSM. EXTR: No c/c/e NEURO  Normal gait.  PSYCH: Normally interactive. Conversant. Not depressed or anxious appearing.  Calm demeanor.  Back:  No cva tenderness, tender in right lumbar region  Assessment and Plan: Otitis media:  Amoxicillin Lumbar strain:  celebrex and flexeril Reflux esophagitis:  Prevacid, carafate, GI consult   \  Follow up as needed  Carmelina Dane, MD

## 2011-12-02 ENCOUNTER — Encounter: Payer: Self-pay | Admitting: Internal Medicine

## 2011-12-24 ENCOUNTER — Emergency Department (HOSPITAL_COMMUNITY): Payer: Worker's Compensation

## 2011-12-24 ENCOUNTER — Encounter (HOSPITAL_COMMUNITY): Payer: Self-pay | Admitting: Emergency Medicine

## 2011-12-24 ENCOUNTER — Emergency Department (HOSPITAL_COMMUNITY)
Admission: EM | Admit: 2011-12-24 | Discharge: 2011-12-24 | Disposition: A | Discharge status: Home / Self Care | Payer: Worker's Compensation | Attending: Emergency Medicine | Admitting: Emergency Medicine

## 2011-12-24 DIAGNOSIS — S060XAA Concussion with loss of consciousness status unknown, initial encounter: Secondary | ICD-10-CM | POA: Insufficient documentation

## 2011-12-24 DIAGNOSIS — Y9289 Other specified places as the place of occurrence of the external cause: Secondary | ICD-10-CM | POA: Insufficient documentation

## 2011-12-24 DIAGNOSIS — M25561 Pain in right knee: Secondary | ICD-10-CM

## 2011-12-24 DIAGNOSIS — W11XXXA Fall on and from ladder, initial encounter: Secondary | ICD-10-CM | POA: Insufficient documentation

## 2011-12-24 DIAGNOSIS — S060X9A Concussion with loss of consciousness of unspecified duration, initial encounter: Secondary | ICD-10-CM | POA: Insufficient documentation

## 2011-12-24 DIAGNOSIS — M25569 Pain in unspecified knee: Secondary | ICD-10-CM | POA: Insufficient documentation

## 2011-12-24 DIAGNOSIS — M542 Cervicalgia: Secondary | ICD-10-CM | POA: Insufficient documentation

## 2011-12-24 LAB — GLUCOSE, CAPILLARY: Glucose-Capillary: 137 mg/dL — ABNORMAL HIGH (ref 70–99)

## 2011-12-24 MED ORDER — IBUPROFEN 600 MG PO TABS: 600.0000 mg | ORAL_TABLET | Freq: Three times a day (TID) | ORAL | Status: DC | PRN

## 2011-12-24 MED ORDER — MORPHINE SULFATE 4 MG/ML IJ SOLN
6.0000 mg | Freq: Once | INTRAMUSCULAR | Status: AC
  Administered 2011-12-24: 4 mg via INTRAVENOUS
  Filled 2011-12-24: qty 1

## 2011-12-24 NOTE — ED Notes (Signed)
Pt states he took his xanax .25 tab when he got back from CT scan, states he had a loose pill in his pocket and was so anxious that " i couldn't stand it". RN asked pt if he had any more medications and pt states no. RN checked pt pockets and no other pills found.

## 2011-12-24 NOTE — ED Notes (Signed)
EMD at bedside.

## 2011-12-24 NOTE — ED Notes (Signed)
Dr. Debbe Odea was given the patient's EKG.

## 2011-12-24 NOTE — ED Notes (Signed)
Patient ambulated 28 feet with no assistance.

## 2011-12-24 NOTE — ED Provider Notes (Addendum)
History     CSN: [id number]  Arrival date & time 12/24/11  1026   First MD Initiated Contact with Patient 12/24/11 1031      Chief Complaint  Patient presents with  . Fall  . Loss of Consciousness     The history is provided by the patient.  pt reports falling from his ladder while at work approximately 13 week.  He reports striking the right side of his forehead.  He lost consciousness.  His had no nausea or vomiting since this.  He ambulated into the emergency department.  He was placed in a c-collar on triage.  He reports headache and mild neck pain.  He denies weakness of his upper lower extremities.  He has no abdominal pain.  Reports pain in his present in his right knee.  He has no numbness or tingling.  His pain is mild to moderate in severity at this time.  Past Medical History  Diagnosis Date  . Cholesteatoma of right ear   . Anxiety   . GERD (gastroesophageal reflux disease)     Past Surgical History  Procedure Date  . External ear surgery   . Spine surgery     Family History  Problem Relation Age of Onset  . COPD Mother   . Diabetes Mother   . Hypertension Mother   . Heart disease Father     History  Substance Use Topics  . Smoking status: Never Smoker   . Smokeless tobacco: Not on file  . Alcohol Use: Yes      Review of Systems  All other systems reviewed and are negative.    Allergies  Review of patient's allergies indicates no known allergies.  Home Medications   Current Outpatient Rx  Name Route Sig Dispense Refill  . ALPRAZOLAM 0.5 MG PO TABS Oral Take 0.25-0.5 mg by mouth at bedtime as needed. For sleep.    Marland Kitchen ESOMEPRAZOLE MAGNESIUM 40 MG PO CPDR Oral Take 40 mg by mouth daily as needed. For heartburn.      BP 158/129  Pulse 79  Temp 97.8 F (36.6 C) (Oral)  Resp 19  SpO2 100%  Physical Exam  Nursing note and vitals reviewed. Constitutional: He is oriented to person, place, and time. He appears well-developed and  well-nourished. Cervical collar in place.  HENT:  Head: Normocephalic and atraumatic.       Hematoma right forehead, small, without laceration  Eyes: EOM are normal. Pupils are equal, round, and reactive to light.  Neck: Neck supple. Spinous process tenderness and muscular tenderness present.  Cardiovascular: Normal rate, regular rhythm, normal heart sounds and intact distal pulses.   Pulmonary/Chest: Effort normal and breath sounds normal. No respiratory distress.  Abdominal: Soft. He exhibits no distension. There is no tenderness.  Musculoskeletal: Normal range of motion.       Mild pain with palpation of right knee lateral joint line. Normal pulses right foot. Full ROM of right knee and right hip  Neurological: He is alert and oriented to person, place, and time.  Skin: Skin is warm and dry.  Psychiatric: He has a normal mood and affect. Judgment normal.    ED Course  Procedures (including critical care time)  Labs Reviewed  GLUCOSE, CAPILLARY - Abnormal; Notable for the following:    Glucose-Capillary 137 (*)     All other components within normal limits   Dg Chest 1 View  12/24/2011  *RADIOLOGY REPORT*  Clinical Data: Syncope.  Fall.  Hypertension.  CHEST -  1 VIEW  Comparison: 01/22/2011  Findings: Heart size is normal.  Both lungs are clear.  No evidence of pleural effusion.  No mass or lymphadenopathy identified.  IMPRESSION: Stable exam.  No active disease.   Original Report Authenticated By: Danae Orleans, M.D.    Ct Head Wo Contrast  12/24/2011  *RADIOLOGY REPORT*  Clinical Data:  Fall, seizure, pain.  CT HEAD WITHOUT CONTRAST CT CERVICAL SPINE WITHOUT CONTRAST  Technique:  Multidetector CT imaging of the head and cervical spine was performed following the standard protocol without intravenous contrast.  Multiplanar CT image reconstructions of the cervical spine were also generated.  Comparison:  07/08/2009  CT HEAD  Findings: No acute intracranial abnormality.  Specifically, no  hemorrhage, hydrocephalus, mass lesion, acute infarction, or significant intracranial injury.  No acute calvarial abnormality. Visualized paranasal sinuses and mastoids clear.  Orbital soft tissues unremarkable.  IMPRESSION: Normal study  CT CERVICAL SPINE  Findings: Normal alignment.  Disc spaces are maintained. Prevertebral soft tissues are normal.  No fracture.  No epidural or paraspinal hematoma.  IMPRESSION: No acute bony abnormality.   Original Report Authenticated By: Cyndie Chime, M.D.    Ct Cervical Spine Wo Contrast  12/24/2011  *RADIOLOGY REPORT*  Clinical Data:  Fall, seizure, pain.  CT HEAD WITHOUT CONTRAST CT CERVICAL SPINE WITHOUT CONTRAST  Technique:  Multidetector CT imaging of the head and cervical spine was performed following the standard protocol without intravenous contrast.  Multiplanar CT image reconstructions of the cervical spine were also generated.  Comparison:  07/08/2009  CT HEAD  Findings: No acute intracranial abnormality.  Specifically, no hemorrhage, hydrocephalus, mass lesion, acute infarction, or significant intracranial injury.  No acute calvarial abnormality. Visualized paranasal sinuses and mastoids clear.  Orbital soft tissues unremarkable.  IMPRESSION: Normal study  CT CERVICAL SPINE  Findings: Normal alignment.  Disc spaces are maintained. Prevertebral soft tissues are normal.  No fracture.  No epidural or paraspinal hematoma.  IMPRESSION: No acute bony abnormality.   Original Report Authenticated By: Cyndie Chime, M.D.    Dg Knee Complete 4 Views Right  12/24/2011  *RADIOLOGY REPORT*  Clinical Data: Fall, loss of consciousness, knee pain.  RIGHT KNEE - COMPLETE 4+ VIEW  Comparison: None  Findings: No acute bony abnormality.  Specifically, no fracture, subluxation, or dislocation.  Soft tissues are intact.  No joint effusion.  IMPRESSION: No bony abnormality.   Original Report Authenticated By: Cyndie Chime, M.D.     I personally reviewed the imaging tests  through PACS system  I reviewed available ER/hospitalization records thought the EMR   1. Concussion   2. Right knee pain     Date: 12/24/2011  Rate: 62  Rhythm: normal sinus rhythm  QRS Axis: normal  Intervals: normal  ST/T Wave abnormalities: normal  Conduction Disutrbances: none  Narrative Interpretation:   Old EKG Reviewed: No significant changes noted      MDM  No fracture. No radiographic abnormalities. nml mental status. Will ambulate. 5/5 strength throughout upper and lower extremity major muscle groups.   12:24 PM Ambulated around ER without any difficulty        Lyanne Co, MD 12/24/11 1224  Lyanne Co, MD 12/24/11 1531

## 2011-12-24 NOTE — ED Notes (Signed)
Pt presenting to ed with c/o fall approximately 13 feet off of a ladder pt states he had positive loc. Pt ambulated into hospital without difficulty. Pt placed in c-collar in triage. Pt is alert and oriented at this time pt able to move all extremities

## 2011-12-24 NOTE — ED Notes (Signed)
Pt made aware that he cannot drive. Ride at pts bedside

## 2011-12-24 NOTE — ED Notes (Signed)
Patient transported to CT 

## 2011-12-29 ENCOUNTER — Encounter: Payer: Self-pay | Admitting: Internal Medicine

## 2011-12-30 ENCOUNTER — Encounter: Payer: Self-pay | Admitting: Internal Medicine

## 2011-12-30 ENCOUNTER — Telehealth: Payer: Self-pay | Admitting: Internal Medicine

## 2011-12-30 ENCOUNTER — Ambulatory Visit (INDEPENDENT_AMBULATORY_CARE_PROVIDER_SITE_OTHER): Payer: 59 | Admitting: Internal Medicine

## 2011-12-30 VITALS — BP 126/70 | HR 80 | Ht 70.75 in | Wt 189.2 lb

## 2011-12-30 DIAGNOSIS — K3189 Other diseases of stomach and duodenum: Secondary | ICD-10-CM

## 2011-12-30 DIAGNOSIS — R1310 Dysphagia, unspecified: Secondary | ICD-10-CM

## 2011-12-30 DIAGNOSIS — R1013 Epigastric pain: Secondary | ICD-10-CM | POA: Insufficient documentation

## 2011-12-30 DIAGNOSIS — K219 Gastro-esophageal reflux disease without esophagitis: Secondary | ICD-10-CM | POA: Insufficient documentation

## 2011-12-30 MED ORDER — ESOMEPRAZOLE MAGNESIUM 40 MG PO CPDR: 40.0000 mg | DELAYED_RELEASE_CAPSULE | Freq: Every day | ORAL | Status: DC | PRN

## 2011-12-30 NOTE — Progress Notes (Signed)
Patient ID: Jacob [name], male   DOB: 1966-02-16, 46 y.o.   MRN: [id number]  SUBJECTIVE: HPI Jacob [name] is a 46 year old male with a past medical history of borderline diabetes, GERD, and anxiety who is seen in consultation at the request of Dr. Regino Schultze for evaluation of GERD with dysphagia.  He was initially seen by Dr. Jeani Hawking who recommended EGD, but due to insurance purposes, EGD was not performed. The patient reports a long-standing history of GERD which was treated initially with as needed over-the-counter antacids. He subsequently developed solid food dysphagia, particularly to meat and bread. He also reports several issues of intermittent "choking", where he states food gets stuck and he did be regurgitated back up. He does report occasional nausea, mild epigastric discomfort, but no vomiting.   He has been on Nexium 40 mg daily, and this has significantly helped his heartburn, and to some degree his dysphagia. He does report initially being nonadherent Nexium, partially due to to financial reasons.  He denies melena or bright red blood per rectum. No change in bowel habits. No lower abdominal pain. No weight loss. No fevers or chills.   Review of Systems  As per history of present illness, otherwise negative   Past Medical History  Diagnosis Date  . Cholesteatoma of right ear   . Anxiety   . GERD (gastroesophageal reflux disease)   . Diabetes mellitus     bordreline    Current Outpatient Prescriptions  Medication Sig Dispense Refill  . ALPRAZolam (XANAX) 0.5 MG tablet Take 0.25-0.5 mg by mouth at bedtime as needed. For sleep.      Marland Kitchen esomeprazole (NEXIUM) 40 MG capsule Take 1 capsule (40 mg total) by mouth daily as needed. For heartburn.  90 capsule  3  . ibuprofen (ADVIL,MOTRIN) 600 MG tablet Take 1 tablet (600 mg total) by mouth every 8 (eight) hours as needed for pain.  15 tablet  0  . sucralfate (CARAFATE) 1 G tablet Take 1 g by mouth daily.        No Known Allergies  Family  History  Problem Relation Age of Onset  . COPD Mother   . Hypertension Mother   . Heart disease Father   . Lung cancer Maternal Uncle   . Diabetes Paternal Grandmother   . Diabetes Maternal Grandmother     History  Substance Use Topics  . Smoking status: Never Smoker   . Smokeless tobacco: Never Used  . Alcohol Use: Yes     social    OBJECTIVE: BP 126/70  Pulse 80  Ht 5' 10.75" (1.797 m)  Wt 189 lb 4 oz (85.843 kg)  BMI 26.58 kg/m2 Constitutional: Well-developed and well-nourished. No distress. HEENT: Normocephalic and atraumatic. Oropharynx is clear and moist. No oropharyngeal exudate. Conjunctivae are normal. Pupils are equal round and reactive to light. No scleral icterus. Cardiovascular: Normal rate, regular rhythm and intact distal pulses. No M/R/G Pulmonary/chest: Effort normal and breath sounds normal. No wheezing, rales or rhonchi. Abdominal: Soft, nontender, nondistended. Bowel sounds active throughout. There are no masses palpable. Extremities: no clubbing, cyanosis, or edema Neurological: Alert and oriented to person place and time. Skin: Skin is warm and dry. No rashes noted. Psychiatric: Normal mood and affect. Behavior is normal.  ASSESSMENT AND PLAN: 46 year old male with a past medical history of borderline diabetes, GERD, and anxiety who is seen in consultation at the request of Dr. Regino Schultze for evaluation of GERD with dysphagia.  1.  GERD with dysphagia -- the patient  does have classic symptoms of GERD and a large portion of the symptoms have responded to PPI. He is still having some solid food dysphagia, and I recommended direct visualization with EGD. Dilation could be performed at this time if necessary. His symptoms of dysphagia, could be related to erosive esophagitis. Given his mild epigastric discomfort, I would also like to rule out gastritis and H. pylori disease.  I recommend he continue Nexium 40 mg daily, and he is given refills. Further  recommendations to be made after endoscopy.

## 2011-12-30 NOTE — Patient Instructions (Addendum)
You have been scheduled for an endoscopy with propofol. Please follow written instructions given to you at your visit today. If you use inhalers (even only as needed), please bring them with you on the day of your procedure.  We have sent the following medications to your pharmacy for you to pick up at your convenience: nexium

## 2011-12-31 NOTE — Telephone Encounter (Signed)
I spoke with Mr. Lagrave, and he was wondering if he was going to be "loopy all day tomorrow."  I explained the propofol to him, and that he would be awake when he left the building.  I also told him that he couldn't drive nor have any alcohol tomorrow.  He asked about Saturday night and was pleased to know that he could have alcohol at that time.   Patient understands that he should follow instructions both before and after the procedure.

## 2012-01-01 ENCOUNTER — Ambulatory Visit (AMBULATORY_SURGERY_CENTER): Payer: 59 | Admitting: Internal Medicine

## 2012-01-01 ENCOUNTER — Encounter: Payer: Self-pay | Admitting: Internal Medicine

## 2012-01-01 VITALS — BP 139/94 | HR 57 | Temp 97.6°F | Resp 14 | Ht 70.5 in | Wt 189.0 lb

## 2012-01-01 DIAGNOSIS — R1013 Epigastric pain: Secondary | ICD-10-CM

## 2012-01-01 DIAGNOSIS — R1310 Dysphagia, unspecified: Secondary | ICD-10-CM

## 2012-01-01 DIAGNOSIS — D131 Benign neoplasm of stomach: Secondary | ICD-10-CM

## 2012-01-01 DIAGNOSIS — K219 Gastro-esophageal reflux disease without esophagitis: Secondary | ICD-10-CM

## 2012-01-01 MED ORDER — SODIUM CHLORIDE 0.9 % IV SOLN: 500.0000 mL | INTRAVENOUS | Status: DC

## 2012-01-01 NOTE — Patient Instructions (Addendum)
YOU HAD AN ENDOSCOPIC PROCEDURE TODAY AT THE [facility]: Refer to the procedure report that was given to you for any specific questions about what was found during the examination.  If the procedure report does not answer your questions, please call your gastroenterologist to clarify.  If you requested that your care partner not be given the details of your procedure findings, then the procedure report has been included in a sealed envelope for you to review at your convenience later.  YOU SHOULD EXPECT: Some feelings of bloating in the abdomen. Passage of more gas than usual.  Walking can help get rid of the air that was put into your GI tract during the procedure and reduce the bloating. If you had a lower endoscopy (such as a colonoscopy or flexible sigmoidoscopy) you may notice spotting of blood in your stool or on the toilet paper. If you underwent a bowel prep for your procedure, then you may not have a normal bowel movement for a few days.  DIET: Your first meal following the procedure should be a light meal and then it is ok to progress to your normal diet.  A half-sandwich or bowl of soup is an example of a good first meal.  Heavy or fried foods are harder to digest and may make you feel nauseous or bloated.  Likewise meals heavy in dairy and vegetables can cause extra gas to form and this can also increase the bloating.  Drink plenty of fluids but you should avoid alcoholic beverages for 24 hours.  ACTIVITY: Your care partner should take you home directly after the procedure.  You should plan to take it easy, moving slowly for the rest of the day.  You can resume normal activity the day after the procedure however you should NOT DRIVE or use heavy machinery for 24 hours (because of the sedation medicines used during the test).    SYMPTOMS TO REPORT IMMEDIATELY: A gastroenterologist can be reached at any hour.  During normal business hours, 8:30 AM to 5:00 PM Monday through Friday,  call [phone].  After hours and on weekends, please call the GI answering service at [phone] who will take a message and have the physician on call contact you.    Following upper endoscopy (EGD)  Vomiting of blood or coffee ground material  New chest pain or pain under the shoulder blades  Painful or persistently difficult swallowing  New shortness of breath  Fever of 100F or higher  Black, tarry-looking stools  FOLLOW UP: If any biopsies were taken you will be contacted by phone or by letter within the next 1-3 weeks.  Call your gastroenterologist if you have not heard about the biopsies in 3 weeks.  Our staff will call the home number listed on your records the next business day following your procedure to check on you and address any questions or concerns that you may have at that time regarding the information given to you following your procedure. This is a courtesy call and so if there is no answer at the home number and we have not heard from you through the emergency physician on call, we will assume that you have returned to your regular daily activities without incident.  SIGNATURES/CONFIDENTIALITY: You and/or your care partner have signed paperwork which will be entered into your electronic medical record.  These signatures attest to the fact that that the information above on your After Visit Summary has been reviewed and is understood.  Full   responsibility of the confidentiality of this discharge information lies with you and/or your care-partner.  Gastritis information given.    Advised per Dr. Rhea Belton that he did not need to follow the post dilation diet because he was well dilated.

## 2012-01-01 NOTE — Op Note (Signed)
[facility] [address] [place], [address]   ENDOSCOPY PROCEDURE REPORT  PATIENT: Jacob, [name]  MR#: [id number] BIRTHDATE: [birth year] , 45  yrs. old GENDER: Male ENDOSCOPIST: Beverley Fiedler, MD REFERRED BY:  Karleen Hampshire PROCEDURE DATE:  01/01/2012 PROCEDURE:  EGD w/ biopsy and Savary dilation of esophagus ASA CLASS:     Class II INDICATIONS:  history of GERD.   dysphagia. MEDICATIONS: MAC sedation, administered by CRNA and propofol (Diprivan) 200mg  IV TOPICAL ANESTHETIC: none  DESCRIPTION OF PROCEDURE: After the risks benefits and alternatives of the procedure were thoroughly explained, informed consent was obtained.  The LB GIF-H180 [id number] endoscope was introduced through the mouth and advanced to the second portion of the duodenum. Without limitations.  The instrument was slowly withdrawn as the mucosa was fully examined.   ESOPHAGUS: The mucosa of the esophagus appeared normal.   Empiric dilation was performed with Savary over a wire, 18 mm (one pass, without heme on dilator)  STOMACH: Mild gastritis (inflammation) was found in the gastric antrum.  Biopsies were taken in the antrum and angularis.   Stomach otherwise unremarkable.  DUODENUM: The duodenal mucosa showed no abnormalities in the bulb and second portion of the duodenum. The scope was then withdrawn from the patient and the procedure completed.  COMPLICATIONS: There were no complications. ENDOSCOPIC IMPRESSION: 1.   Normal esophagus.  Empiric dilation performed with 18 mm Savary. 2.   Distal gastritis, biopsies to exclude H. Pylori. 3.   Normal duodenum  RECOMMENDATIONS: 1.  Await pathology results 2.  Follow-up of helicobacter pylori status, treat if indicated 3.  Continue taking your PPI (Nexium) once daily.  It is best to be taken 20-30 minutes prior to breakfast meal.   eSigned:  Beverley Fiedler, MD 01/01/2012 3:53 PM CC:The Patient and Karleen Hampshire, MD

## 2012-01-01 NOTE — Progress Notes (Signed)
Patient did not experience any of the following events: a burn prior to discharge; a fall within the facility; wrong site/side/patient/procedure/implant event; or a hospital transfer or hospital admission upon discharge from the facility. (G8907) Patient did not have preoperative order for IV antibiotic SSI prophylaxis. (G8918)  

## 2012-01-01 NOTE — Progress Notes (Signed)
Dr. Rhea Belton delayed at hospital.

## 2012-01-04 ENCOUNTER — Telehealth: Payer: Self-pay | Admitting: *Deleted

## 2012-01-04 NOTE — Telephone Encounter (Signed)
  Follow up Call-  Call back number 01/01/2012  Post procedure Call Back phone  # [phone] cell  Permission to leave phone message Yes     Patient questions:  Do you have a fever, pain , or abdominal swelling? no Pain Score  0 *  Have you tolerated food without any problems? yes  Have you been able to return to your normal activities? yes  Do you have any questions about your discharge instructions: Diet   no Medications  no Follow up visit  no  Do you have questions or concerns about your Care? no  Actions: * If pain score is 4 or above: No action needed, pain <4.

## 2012-01-06 ENCOUNTER — Encounter: Payer: Self-pay | Admitting: Internal Medicine

## 2012-01-07 ENCOUNTER — Other Ambulatory Visit: Payer: Self-pay | Admitting: Gastroenterology

## 2012-01-07 MED ORDER — PANTOPRAZOLE SODIUM 40 MG PO TBEC: 40.0000 mg | DELAYED_RELEASE_TABLET | Freq: Every day | ORAL | Status: DC

## 2012-01-09 HISTORY — PX: UPPER GI ENDOSCOPY: SHX6162

## 2012-01-25 LAB — HM DIABETES EYE EXAM: HM Diabetic Eye Exam: NORMAL

## 2012-08-08 ENCOUNTER — Emergency Department (HOSPITAL_COMMUNITY)
Admission: EM | Admit: 2012-08-08 | Discharge: 2012-08-08 | Disposition: A | Discharge status: Home / Self Care | Payer: Self-pay | Attending: Emergency Medicine | Admitting: Emergency Medicine

## 2012-08-08 ENCOUNTER — Emergency Department (HOSPITAL_COMMUNITY): Payer: Self-pay

## 2012-08-08 ENCOUNTER — Encounter (HOSPITAL_COMMUNITY): Payer: Self-pay | Admitting: Emergency Medicine

## 2012-08-08 DIAGNOSIS — R0602 Shortness of breath: Secondary | ICD-10-CM | POA: Insufficient documentation

## 2012-08-08 DIAGNOSIS — R0789 Other chest pain: Secondary | ICD-10-CM | POA: Insufficient documentation

## 2012-08-08 DIAGNOSIS — R1013 Epigastric pain: Secondary | ICD-10-CM

## 2012-08-08 DIAGNOSIS — R110 Nausea: Secondary | ICD-10-CM | POA: Insufficient documentation

## 2012-08-08 DIAGNOSIS — K219 Gastro-esophageal reflux disease without esophagitis: Secondary | ICD-10-CM | POA: Insufficient documentation

## 2012-08-08 DIAGNOSIS — Z79899 Other long term (current) drug therapy: Secondary | ICD-10-CM | POA: Insufficient documentation

## 2012-08-08 DIAGNOSIS — K3189 Other diseases of stomach and duodenum: Secondary | ICD-10-CM | POA: Insufficient documentation

## 2012-08-08 DIAGNOSIS — R109 Unspecified abdominal pain: Secondary | ICD-10-CM | POA: Insufficient documentation

## 2012-08-08 DIAGNOSIS — E119 Type 2 diabetes mellitus without complications: Secondary | ICD-10-CM | POA: Insufficient documentation

## 2012-08-08 DIAGNOSIS — Z8669 Personal history of other diseases of the nervous system and sense organs: Secondary | ICD-10-CM | POA: Insufficient documentation

## 2012-08-08 DIAGNOSIS — F411 Generalized anxiety disorder: Secondary | ICD-10-CM | POA: Insufficient documentation

## 2012-08-08 LAB — COMPREHENSIVE METABOLIC PANEL
ALT: 20 U/L (ref 0–53)
AST: 17 U/L (ref 0–37)
Albumin: 3.5 g/dL (ref 3.5–5.2)
CO2: 26 mEq/L (ref 19–32)
Chloride: 105 mEq/L (ref 96–112)
Creatinine, Ser: 1.06 mg/dL (ref 0.50–1.35)
GFR calc non Af Amer: 83 mL/min — ABNORMAL LOW (ref >90)
Sodium: 139 mEq/L (ref 135–145)
Total Bilirubin: 0.5 mg/dL (ref 0.3–1.2)

## 2012-08-08 LAB — CBC WITH DIFFERENTIAL/PLATELET
Basophils Absolute: 0 10*3/uL (ref 0.0–0.1)
Basophils Relative: 0 % (ref 0–1)
Lymphocytes Relative: 35 % (ref 12–46)
MCHC: 35.5 g/dL (ref 30.0–36.0)
Neutro Abs: 2.1 10*3/uL (ref 1.7–7.7)
Neutrophils Relative %: 54 % (ref 43–77)
Platelets: 148 10*3/uL — ABNORMAL LOW (ref 150–400)
RDW: 12.6 % (ref 11.5–15.5)
WBC: 3.9 10*3/uL — ABNORMAL LOW (ref 4.0–10.5)

## 2012-08-08 LAB — POCT I-STAT TROPONIN I: Troponin i, poc: 0 ng/mL (ref 0.00–0.08)

## 2012-08-08 MED ORDER — GI COCKTAIL ~~LOC~~
30.0000 mL | Freq: Once | ORAL | Status: AC
  Administered 2012-08-08: 30 mL via ORAL
  Filled 2012-08-08: qty 30

## 2012-08-08 MED ORDER — OMEPRAZOLE 20 MG PO CPDR: 20.0000 mg | DELAYED_RELEASE_CAPSULE | Freq: Every day | ORAL | Status: DC

## 2012-08-08 NOTE — ED Notes (Signed)
Pt c/o L chest pain radiating to L side, sharp in nature, +nausea, +SHOB,+diaphoresis, pt states pain awoke him @ 0030 last night. Pt took ASA 81mg  x 2 last night and ASA 81mg  x 3 this am.

## 2012-08-08 NOTE — ED Provider Notes (Addendum)
History     CSN: [id number]  Arrival date & time 08/08/12  4098   First MD Initiated Contact with Patient 08/08/12 [phone]      Chief Complaint  Patient presents with  . Chest Pain    (Consider location/radiation/quality/duration/timing/severity/associated sxs/prior treatment) Patient is a 47 y.o. male presenting with chest pain. The history is provided by the patient.  Chest Pain Pain location:  L chest Pain quality: sharp   Radiates to: to the left abd. Pain radiates to the back: no   Pain severity:  Moderate Onset quality:  Sudden Duration: states the pain last for seconds and then resolves and returns. Timing:  Sporadic Progression:  Unchanged Chronicity:  New (started last night about 1 hour after going to sleep and resolved and then returned this am) Context: eating   Context: not breathing, not lifting, no movement, not raising an arm and no trauma   Context comment:  This morning started when eating a bowel of cereal Relieved by:  Aspirin Worsened by:  Nothing tried Ineffective treatments:  None tried Associated symptoms: abdominal pain, anxiety, nausea and shortness of breath   Associated symptoms: no fever, not vomiting and no weakness   Associated symptoms comment:  States that after the pain started today he felt very anxious and had to take a xanax  Risk factors: diabetes mellitus and male sex   Risk factors: no coronary artery disease, no high cholesterol, no hypertension, no immobilization, no prior DVT/PE, no smoking and no surgery     Past Medical History  Diagnosis Date  . Cholesteatoma of right ear   . Anxiety   . GERD (gastroesophageal reflux disease)   . Allergy   . Diabetes mellitus     bordreline    Past Surgical History  Procedure Laterality Date  . Lumbar disc surgery    . Inner ear surgery      right  . Cholecystectomy    . Tubes in bil ears      Family History  Problem Relation Age of Onset  . COPD Mother   . Hypertension Mother    . Heart disease Father   . Lung cancer Maternal Uncle   . Diabetes Paternal Grandmother   . Diabetes Maternal Grandmother   . Colon cancer Neg Hx   . Esophageal cancer Neg Hx   . Rectal cancer Neg Hx   . Stomach cancer Neg Hx     History  Substance Use Topics  . Smoking status: Never Smoker   . Smokeless tobacco: Never Used  . Alcohol Use: Yes     Comment: social      Review of Systems  Constitutional: Negative for fever.  Respiratory: Positive for shortness of breath.   Cardiovascular: Positive for chest pain.  Gastrointestinal: Positive for nausea and abdominal pain. Negative for vomiting.  Neurological: Negative for weakness.  All other systems reviewed and are negative.    Allergies  Review of patient's allergies indicates no known allergies.  Home Medications   Current Outpatient Rx  Name  Route  Sig  Dispense  Refill  . ALPRAZolam (XANAX) 0.5 MG tablet   Oral   Take 0.25-0.5 mg by mouth at bedtime as needed. For sleep.         Marland Kitchen amoxicillin (AMOXIL) 875 MG tablet               . azithromycin (ZITHROMAX) 250 MG tablet               .  esomeprazole (NEXIUM) 40 MG capsule   Oral   Take 1 capsule (40 mg total) by mouth daily as needed. For heartburn.   90 capsule   3   . HYDROcodone-acetaminophen (NORCO) 7.5-325 MG per tablet               . ibuprofen (ADVIL,MOTRIN) 600 MG tablet   Oral   Take 1 tablet (600 mg total) by mouth every 8 (eight) hours as needed for pain.   15 tablet   0   . metFORMIN (GLUCOPHAGE-XR) 500 MG 24 hr tablet               . pantoprazole (PROTONIX) 40 MG tablet   Oral   Take 1 tablet (40 mg total) by mouth daily.   90 tablet   3   . sucralfate (CARAFATE) 1 G tablet   Oral   Take 1 g by mouth daily.           BP 131/76  Pulse 63  Temp(Src) 98.2 F (36.8 C) (Oral)  Resp 20  Ht 5\' 11"  (1.803 m)  Wt 185 lb (83.915 kg)  BMI 25.81 kg/m2  SpO2 100%  Physical Exam  Nursing note and vitals  reviewed. Constitutional: He is oriented to person, place, and time. He appears well-developed and well-nourished. No distress.  HENT:  Head: Normocephalic and atraumatic.  Mouth/Throat: Oropharynx is clear and moist.  Eyes: Conjunctivae and EOM are normal. Pupils are equal, round, and reactive to light.  Neck: Normal range of motion. Neck supple.  Cardiovascular: Normal rate, regular rhythm and intact distal pulses.   No murmur heard. Pulmonary/Chest: Effort normal and breath sounds normal. No respiratory distress. He has no wheezes. He has no rales.  Abdominal: Soft. He exhibits no distension. There is no tenderness. There is no rebound and no guarding.  Musculoskeletal: Normal range of motion. He exhibits no edema and no tenderness.  Neurological: He is alert and oriented to person, place, and time.  Skin: Skin is warm and dry. No rash noted. No erythema.  Psychiatric: He has a normal mood and affect. His behavior is normal.    ED Course  Procedures (including critical care time)  Labs Reviewed  CBC WITH DIFFERENTIAL - Abnormal; Notable for the following:    WBC 3.9 (*)    Platelets 148 (*)    All other components within normal limits  COMPREHENSIVE METABOLIC PANEL - Abnormal; Notable for the following:    Glucose, Bld 178 (*)    GFR calc non Af Amer 83 (*)    All other components within normal limits  POCT I-STAT TROPONIN I  POCT I-STAT TROPONIN I   Dg Chest 2 View  08/08/2012  *RADIOLOGY REPORT*  Clinical Data: Chest pain.  CHEST - 2 VIEW  Comparison: 12/24/2011 and 01/22/2011  Findings: The heart size and pulmonary vascularity are normal and the lungs are clear except for slight bilateral apical pleural scarring.  No osseous abnormality.  IMPRESSION: No acute disease.   Original Report Authenticated By: Francene Boyers, M.D.      Date: 08/08/2012  Rate: 64  Rhythm: normal sinus rhythm  QRS Axis: normal  Intervals: normal  ST/T Wave abnormalities: normal  Conduction  Disutrbances: none  Narrative Interpretation: unremarkable     1. Atypical chest pain   2. Dyspepsia       MDM   Pt with atypical story for CP that occurs for seconds and is sharp.  2 episodes since last night.  risk  factors include DM.  PERC neg.  EKG wnl.  CXR, CBC, CMP, troponin (0,45min) pending.  Pt's sx suggestive of possible GI cause as he states feels like gas with pain in the lower chest that shoots down to the left side and episode of pain today started after eating a bowel of cereal.  No abd pain on exam.  NO reproducible chest pain.  Pt at one time was on nexium and protonix but no longer taking either.  No heavy EtOH use and does not smoke.  Well appearing here and normal EKG. Will give GI cocktail and re-evaluate.  9:10 AM Labs wnl.  Better after GI cocktail and pt states he ran out of nexium and has heartburn all week.  Feel this is GI will check repeat troponin at 10.  10:36 AM Second troponin neg will d/c home.     Gwyneth Sprout, MD 08/08/12 1037  Gwyneth Sprout, MD 08/08/12 1038

## 2012-09-22 ENCOUNTER — Ambulatory Visit: Payer: Self-pay | Admitting: Family Medicine

## 2012-09-22 VITALS — BP 123/79 | HR 70 | Temp 98.0°F | Resp 17 | Ht 70.5 in | Wt 183.0 lb

## 2012-09-22 DIAGNOSIS — S0190XA Unspecified open wound of unspecified part of head, initial encounter: Secondary | ICD-10-CM

## 2012-09-22 DIAGNOSIS — Z23 Encounter for immunization: Secondary | ICD-10-CM

## 2012-09-22 DIAGNOSIS — S060X0A Concussion without loss of consciousness, initial encounter: Secondary | ICD-10-CM

## 2012-09-22 DIAGNOSIS — J0100 Acute maxillary sinusitis, unspecified: Secondary | ICD-10-CM

## 2012-09-22 MED ORDER — AMOXICILLIN 875 MG PO TABS: 875.0000 mg | ORAL_TABLET | Freq: Two times a day (BID) | ORAL | Status: DC

## 2012-09-22 NOTE — Patient Instructions (Addendum)
Soap and water over wound. Out of work today, start antibiotic for sinuses, recheck if not improving in next 2 days. Return to the clinic or go to the nearest emergency room if any of your symptoms worsen or new symptoms occur. WOUND CARE . Continue daily cleansing with soap and water  . Do not apply any ointments or creams to the wound as this may cause delayed healing. . Notify the office if you experience any of the following signs of infection: Swelling, redness, pus drainage, streaking, fever >101.0 F . Notify the office if you experience excessive bleeding that does not stop after 15-20 minutes of constant, firm pressure.  HEAD INJURY If any of the following occur notify your physician or go to the Hospital Emergency Department: . Increased drowsiness, stupor or loss of consciousness . Restlessness or convulsions (fits) . Paralysis in arms or legs . Temperature above 100 F . Vomiting . Severe headache . Blood or clear fluid dripping from the nose or ears . Stiffness of the neck . Dizziness or blurred vision . Pulsating pain in the eye . Unequal pupils of eye . Personality changes . Any other unusual symptoms PRECAUTIONS . Keep head elevated at all times for the first 24 hours (Elevate mattress if pillow is ineffective) . Do not take tranquilizers, sedatives, narcotics or alcohol . Avoid aspirin. Use only acetaminophen (e.g. Tylenol) or ibuprofen (e.g. Advil) for relief of pain. Follow directions on the bottle for dosage. . Use ice packs for comfort MEDICATIONS Use medications only as directed by your physician   Sinusitis Sinusitis is redness, soreness, and swelling (inflammation) of the paranasal sinuses. Paranasal sinuses are air pockets within the bones of your face (beneath the eyes, the middle of the forehead, or above the eyes). In healthy paranasal sinuses, mucus is able to drain out, and air is able to circulate through them by way of your nose. However, when  your paranasal sinuses are inflamed, mucus and air can become trapped. This can allow bacteria and other germs to grow and cause infection. Sinusitis can develop quickly and last only a short time (acute) or continue over a long period (chronic). Sinusitis that lasts for more than 12 weeks is considered chronic.  CAUSES  Causes of sinusitis include:  Allergies.  Structural abnormalities, such as displacement of the cartilage that separates your nostrils (deviated septum), which can decrease the air flow through your nose and sinuses and affect sinus drainage.  Functional abnormalities, such as when the small hairs (cilia) that line your sinuses and help remove mucus do not work properly or are not present. SYMPTOMS  Symptoms of acute and chronic sinusitis are the same. The primary symptoms are pain and pressure around the affected sinuses. Other symptoms include:  Upper toothache.  Earache.  Headache.  Bad breath.  Decreased sense of smell and taste.  A cough, which worsens when you are lying flat.  Fatigue.  Fever.  Thick drainage from your nose, which often is green and may contain pus (purulent).  Swelling and warmth over the affected sinuses. DIAGNOSIS  Your caregiver will perform a physical exam. During the exam, your caregiver may:  Look in your nose for signs of abnormal growths in your nostrils (nasal polyps).  Tap over the affected sinus to check for signs of infection.  View the inside of your sinuses (endoscopy) with a special imaging device with a light attached (endoscope), which is inserted into your sinuses. If your caregiver suspects that you have chronic sinusitis,  one or more of the following tests may be recommended:  Allergy tests.  Nasal culture A sample of mucus is taken from your nose and sent to a lab and screened for bacteria.  Nasal cytology A sample of mucus is taken from your nose and examined by your caregiver to determine if your sinusitis  is related to an allergy. TREATMENT  Most cases of acute sinusitis are related to a viral infection and will resolve on their own within 10 days. Sometimes medicines are prescribed to help relieve symptoms (pain medicine, decongestants, nasal steroid sprays, or saline sprays).  However, for sinusitis related to a bacterial infection, your caregiver will prescribe antibiotic medicines. These are medicines that will help kill the bacteria causing the infection.  Rarely, sinusitis is caused by a fungal infection. In theses cases, your caregiver will prescribe antifungal medicine. For some cases of chronic sinusitis, surgery is needed. Generally, these are cases in which sinusitis recurs more than 3 times per year, despite other treatments. HOME CARE INSTRUCTIONS   Drink plenty of water. Water helps thin the mucus so your sinuses can drain more easily.  Use a humidifier.  Inhale steam 3 to 4 times a day (for example, sit in the bathroom with the shower running).  Apply a warm, moist washcloth to your face 3 to 4 times a day, or as directed by your caregiver.  Use saline nasal sprays to help moisten and clean your sinuses.  Take over-the-counter or prescription medicines for pain, discomfort, or fever only as directed by your caregiver. SEEK IMMEDIATE MEDICAL CARE IF:  You have increasing pain or severe headaches.  You have nausea, vomiting, or drowsiness.  You have swelling around your face.  You have vision problems.  You have a stiff neck.  You have difficulty breathing. MAKE SURE YOU:   Understand these instructions.  Will watch your condition.  Will get help right away if you are not doing well or get worse. Document Released: 03/16/2005 Document Revised: 06/08/2011 Document Reviewed: 03/31/2011 [facility] Patient Information 2014 [place], [place].

## 2012-09-22 NOTE — Progress Notes (Addendum)
Subjective:    Patient ID: Jacob [name], male    DOB: 1965-11-14, 47 y.o.   MRN: [id number]  HPI Jacob [name] is a 47 y.o. male  Hit front of L forehead on mirror post of [employer]. Happened yesterday afternoon at about 5:30 pm.  Jacob [name] initially, then again after cleaning with peroxide and neosporin. Headache in area today, but not initially.  Slight dizzy for few seconds. Slight dizzy this am, but has had some sinus congestion/cold symptoms recently. No fever. No n/v.   Last tetanus about 2005.   Considering changing to here for primary care. Does take hydrocodone most days - PCP in [place].   Review of Systems  Constitutional: Negative for fever and chills.  HENT: Positive for congestion (sinus congestion past 2 weeks, with yellow d/c for past week with frontal ha. no fever. ) and sinus pressure.   Eyes: Negative for photophobia and visual disturbance.  Respiratory: Positive for cough. Negative for shortness of breath.   Gastrointestinal: Negative for nausea and vomiting.  Skin: Positive for wound.  Neurological: Positive for light-headedness (initially - minimal dizziness. ) and headaches (slight in area of injury. ). Negative for tremors, syncope and weakness.       Objective:   Physical Exam  Vitals reviewed. Constitutional: He is oriented to person, place, and time. He appears well-developed and well-nourished.  HENT:  Head: Atraumatic. Head is without raccoon's eyes, without Battle's sign and without right periorbital erythema.  Right Ear: Tympanic membrane, external ear and ear canal normal. No hemotympanum.  Left Ear: Tympanic membrane, external ear and ear canal normal. No hemotympanum.  Nose: No rhinorrhea. Right sinus exhibits maxillary sinus tenderness. Right sinus exhibits no frontal sinus tenderness. Left sinus exhibits no maxillary sinus tenderness and no frontal sinus tenderness.  Mouth/Throat: Oropharynx is clear and moist and mucous membranes  are normal. No oropharyngeal exudate or posterior oropharyngeal erythema.  Eyes: Conjunctivae are normal. Pupils are equal, round, and reactive to light.  Neck: Neck supple.  Cardiovascular: Normal rate, regular rhythm, normal heart sounds and intact distal pulses.   No murmur heard. Pulmonary/Chest: Effort normal and breath sounds normal. He has no wheezes. He has no rhonchi. He has no rales.  Abdominal: Soft. There is no tenderness.  Lymphadenopathy:    He has no cervical adenopathy.  Neurological: He is alert and oriented to person, place, and time. He has normal strength. No cranial nerve deficit or sensory deficit. He displays a negative Romberg sign. Coordination and gait normal. GCS eye subscore is 4. GCS verbal subscore is 5. GCS motor subscore is 6.  Months year backwards normal, nl gait, no pronator drift, nonfocal.   Skin: Skin is warm and dry. No rash noted.     Psychiatric: He has a normal mood and affect. His behavior is normal.  5 minute recall 3/3     Assessment & Plan:  Jacob [name] is a 47 y.o. male Maxillary sinusitis, acute - Plan: amoxicillin (AMOXIL) 875 MG tablet rx, rtc precautions. Sx care as below.   Wound, open, head, initial encounter - with delayed presentation to care, but small wound.  No sutures placed.  Applied ss x1, and wound care discussed. tdap updated.  rtc precautions.   Concussion with no loss of consciousness, initial encounter.  Reassuring exam, nonfocal.  oow today as HA (may be component of sinuses), then rtw tomorrow if improving, head injury precautions given, understanding expressed.   Meds ordered this  encounter  Medications  . amoxicillin (AMOXIL) 875 MG tablet    Sig: Take 1 tablet (875 mg total) by mouth 2 (two) times daily.    Dispense:  20 tablet    Refill:  0   Patient Instructions  Soap and water over wound. Out of work today, start antibiotic for sinuses, recheck if not improving in next 2 days. Return to the clinic or go to  the nearest emergency room if any of your symptoms worsen or new symptoms occur. WOUND CARE . Continue daily cleansing with soap and water  . Do not apply any ointments or creams to the wound as this may cause delayed healing. . Notify the office if you experience any of the following signs of infection: Swelling, redness, pus drainage, streaking, fever >101.0 F . Notify the office if you experience excessive bleeding that does not stop after 15-20 minutes of constant, firm pressure.  HEAD INJURY If any of the following occur notify your physician or go to the Hospital Emergency Department: . Increased drowsiness, stupor or loss of consciousness . Restlessness or convulsions (fits) . Paralysis in arms or legs . Temperature above 100 F . Vomiting . Severe headache . Blood or clear fluid dripping from the nose or ears . Stiffness of the neck . Dizziness or blurred vision . Pulsating pain in the eye . Unequal pupils of eye . Personality changes . Any other unusual symptoms PRECAUTIONS . Keep head elevated at all times for the first 24 hours (Elevate mattress if pillow is ineffective) . Do not take tranquilizers, sedatives, narcotics or alcohol . Avoid aspirin. Use only acetaminophen (e.g. Tylenol) or ibuprofen (e.g. Advil) for relief of pain. Follow directions on the bottle for dosage. . Use ice packs for comfort MEDICATIONS Use medications only as directed by your physician   Sinusitis Sinusitis is redness, soreness, and swelling (inflammation) of the paranasal sinuses. Paranasal sinuses are air pockets within the bones of your face (beneath the eyes, the middle of the forehead, or above the eyes). In healthy paranasal sinuses, mucus is able to drain out, and air is able to circulate through them by way of your nose. However, when your paranasal sinuses are inflamed, mucus and air can become trapped. This can allow bacteria and other germs to grow and cause  infection. Sinusitis can develop quickly and last only a short time (acute) or continue over a long period (chronic). Sinusitis that lasts for more than 12 weeks is considered chronic.  CAUSES  Causes of sinusitis include:  Allergies.  Structural abnormalities, such as displacement of the cartilage that separates your nostrils (deviated septum), which can decrease the air flow through your nose and sinuses and affect sinus drainage.  Functional abnormalities, such as when the small hairs (cilia) that line your sinuses and help remove mucus do not work properly or are not present. SYMPTOMS  Symptoms of acute and chronic sinusitis are the same. The primary symptoms are pain and pressure around the affected sinuses. Other symptoms include:  Upper toothache.  Earache.  Headache.  Bad breath.  Decreased sense of smell and taste.  A cough, which worsens when you are lying flat.  Fatigue.  Fever.  Thick drainage from your nose, which often is green and may contain pus (purulent).  Swelling and warmth over the affected sinuses. DIAGNOSIS  Your caregiver will perform a physical exam. During the exam, your caregiver may:  Look in your nose for signs of abnormal growths in your nostrils (nasal polyps).  Tap over the affected sinus to check for signs of infection.  View the inside of your sinuses (endoscopy) with a special imaging device with a light attached (endoscope), which is inserted into your sinuses. If your caregiver suspects that you have chronic sinusitis, one or more of the following tests may be recommended:  Allergy tests.  Nasal culture A sample of mucus is taken from your nose and sent to a lab and screened for bacteria.  Nasal cytology A sample of mucus is taken from your nose and examined by your caregiver to determine if your sinusitis is related to an allergy. TREATMENT  Most cases of acute sinusitis are related to a viral infection and will resolve on their  own within 10 days. Sometimes medicines are prescribed to help relieve symptoms (pain medicine, decongestants, nasal steroid sprays, or saline sprays).  However, for sinusitis related to a bacterial infection, your caregiver will prescribe antibiotic medicines. These are medicines that will help kill the bacteria causing the infection.  Rarely, sinusitis is caused by a fungal infection. In theses cases, your caregiver will prescribe antifungal medicine. For some cases of chronic sinusitis, surgery is needed. Generally, these are cases in which sinusitis recurs more than 3 times per year, despite other treatments. HOME CARE INSTRUCTIONS   Drink plenty of water. Water helps thin the mucus so your sinuses can drain more easily.  Use a humidifier.  Inhale steam 3 to 4 times a day (for example, sit in the bathroom with the shower running).  Apply a warm, moist washcloth to your face 3 to 4 times a day, or as directed by your caregiver.  Use saline nasal sprays to help moisten and clean your sinuses.  Take over-the-counter or prescription medicines for pain, discomfort, or fever only as directed by your caregiver. SEEK IMMEDIATE MEDICAL CARE IF:  You have increasing pain or severe headaches.  You have nausea, vomiting, or drowsiness.  You have swelling around your face.  You have vision problems.  You have a stiff neck.  You have difficulty breathing. MAKE SURE YOU:   Understand these instructions.  Will watch your condition.  Will get help right away if you are not doing well or get worse. Document Released: 03/16/2005 Document Revised: 06/08/2011 Document Reviewed: 03/31/2011 [facility] Patient Information 2014 [place], [place].

## 2013-01-11 ENCOUNTER — Telehealth: Payer: Self-pay

## 2013-01-11 ENCOUNTER — Ambulatory Visit (INDEPENDENT_AMBULATORY_CARE_PROVIDER_SITE_OTHER): Payer: BC Managed Care – PPO | Admitting: Internal Medicine

## 2013-01-11 ENCOUNTER — Ambulatory Visit (INDEPENDENT_AMBULATORY_CARE_PROVIDER_SITE_OTHER): Payer: BC Managed Care – PPO

## 2013-01-11 ENCOUNTER — Encounter: Payer: Self-pay | Admitting: Internal Medicine

## 2013-01-11 VITALS — BP 122/90 | HR 60 | Temp 97.0°F | Resp 16 | Ht 70.5 in | Wt 187.8 lb

## 2013-01-11 DIAGNOSIS — M545 Low back pain, unspecified: Secondary | ICD-10-CM

## 2013-01-11 DIAGNOSIS — M1990 Unspecified osteoarthritis, unspecified site: Secondary | ICD-10-CM

## 2013-01-11 DIAGNOSIS — I10 Essential (primary) hypertension: Secondary | ICD-10-CM

## 2013-01-11 DIAGNOSIS — Z0000 Encounter for general adult medical examination without abnormal findings: Secondary | ICD-10-CM

## 2013-01-11 DIAGNOSIS — IMO0001 Reserved for inherently not codable concepts without codable children: Secondary | ICD-10-CM

## 2013-01-11 DIAGNOSIS — E785 Hyperlipidemia, unspecified: Secondary | ICD-10-CM

## 2013-01-11 DIAGNOSIS — N529 Male erectile dysfunction, unspecified: Secondary | ICD-10-CM

## 2013-01-11 DIAGNOSIS — Z23 Encounter for immunization: Secondary | ICD-10-CM

## 2013-01-11 DIAGNOSIS — R0600 Dyspnea, unspecified: Secondary | ICD-10-CM | POA: Insufficient documentation

## 2013-01-11 DIAGNOSIS — Z0001 Encounter for general adult medical examination with abnormal findings: Secondary | ICD-10-CM | POA: Insufficient documentation

## 2013-01-11 DIAGNOSIS — R0609 Other forms of dyspnea: Secondary | ICD-10-CM

## 2013-01-11 DIAGNOSIS — E119 Type 2 diabetes mellitus without complications: Secondary | ICD-10-CM | POA: Insufficient documentation

## 2013-01-11 LAB — COMPREHENSIVE METABOLIC PANEL
AST: 22 U/L (ref 0–37)
Albumin: 4.3 g/dL (ref 3.5–5.2)
Alkaline Phosphatase: 62 U/L (ref 39–117)
BUN: 12 mg/dL (ref 6–23)
Creatinine, Ser: 1.1 mg/dL (ref 0.4–1.5)
Glucose, Bld: 135 mg/dL — ABNORMAL HIGH (ref 70–99)
Total Bilirubin: 1 mg/dL (ref 0.3–1.2)

## 2013-01-11 LAB — LIPID PANEL
Cholesterol: 186 mg/dL (ref 0–200)
HDL: 38.8 mg/dL — ABNORMAL LOW (ref >39.00)
VLDL: 23 mg/dL (ref 0.0–40.0)

## 2013-01-11 LAB — PSA: PSA: 0.92 ng/mL (ref 0.10–4.00)

## 2013-01-11 LAB — CBC WITH DIFFERENTIAL/PLATELET
Basophils Relative: 0.4 % (ref 0.0–3.0)
Eosinophils Absolute: 0.1 10*3/uL (ref 0.0–0.7)
Eosinophils Relative: 1.3 % (ref 0.0–5.0)
HCT: 46.9 % (ref 39.0–52.0)
Lymphs Abs: 1.3 10*3/uL (ref 0.7–4.0)
MCHC: 34.7 g/dL (ref 30.0–36.0)
MCV: 84.8 fl (ref 78.0–100.0)
Monocytes Absolute: 0.3 10*3/uL (ref 0.1–1.0)
Neutrophils Relative %: 60.7 % (ref 43.0–77.0)
RBC: 5.53 Mil/uL (ref 4.22–5.81)
WBC: 4.2 10*3/uL — ABNORMAL LOW (ref 4.5–10.5)

## 2013-01-11 LAB — URINALYSIS, ROUTINE W REFLEX MICROSCOPIC
Ketones, ur: NEGATIVE
Leukocytes, UA: NEGATIVE
Nitrite: NEGATIVE
RBC / HPF: NONE SEEN (ref 0–?)
Specific Gravity, Urine: 1.02 (ref 1.000–1.030)
Urobilinogen, UA: 1 (ref 0.0–1.0)
pH: 8 (ref 5.0–8.0)

## 2013-01-11 LAB — HEMOGLOBIN A1C: Hgb A1c MFr Bld: 6.8 % — ABNORMAL HIGH (ref 4.6–6.5)

## 2013-01-11 LAB — HM DIABETES FOOT EXAM

## 2013-01-11 MED ORDER — HYDROCODONE-ACETAMINOPHEN 7.5-325 MG PO TABS: 1.0000 | ORAL_TABLET | Freq: Four times a day (QID) | ORAL | Status: DC | PRN

## 2013-01-11 MED ORDER — RAMIPRIL 10 MG PO CAPS: 10.0000 mg | ORAL_CAPSULE | Freq: Every day | ORAL | Status: DC

## 2013-01-11 MED ORDER — TADALAFIL 20 MG PO TABS: 20.0000 mg | ORAL_TABLET | Freq: Every day | ORAL | Status: DC | PRN

## 2013-01-11 NOTE — Progress Notes (Signed)
Subjective:    Patient ID: Jacob [name], male    DOB: 16-Feb-1966, 47 y.o.   MRN: [id number]  Diabetes He presents for his follow-up diabetic visit. He has type 2 diabetes mellitus. The initial diagnosis of diabetes was made 5 years ago. His disease course has been stable. Hypoglycemia symptoms include nervousness/anxiousness. Pertinent negatives for hypoglycemia include no confusion, dizziness or tremors. Associated symptoms include polyuria. Pertinent negatives for diabetes include no blurred vision, no chest pain, no fatigue, no foot paresthesias, no foot ulcerations, no polydipsia, no polyphagia, no visual change, no weakness and no weight loss. There are no hypoglycemic complications. Symptoms are stable. Diabetic complications include impotence. Risk factors for coronary artery disease include dyslipidemia, diabetes mellitus and hypertension. Current diabetic treatment includes oral agent (monotherapy). He is compliant with treatment all of the time. His weight is stable. He is following a generally unhealthy diet. When asked about meal planning, he reported none. He has not had a previous visit with a dietician. He participates in exercise intermittently. There is no change in his home blood glucose trend. An ACE inhibitor/angiotensin II receptor blocker is not being taken. He does not see a podiatrist.Eye exam is current.      Review of Systems  Constitutional: Negative.  Negative for fever, chills, weight loss, diaphoresis, activity change, appetite change, fatigue and unexpected weight change.  HENT: Negative.   Eyes: Negative.  Negative for blurred vision.  Respiratory: Positive for shortness of breath. Negative for apnea, cough, choking, chest tightness, wheezing and stridor.   Cardiovascular: Negative.  Negative for chest pain, palpitations and leg swelling.  Gastrointestinal: Negative.  Negative for nausea, vomiting, abdominal pain, diarrhea, constipation and blood in stool.   Endocrine: Positive for polyuria. Negative for polydipsia and polyphagia.  Genitourinary: Positive for impotence.  Musculoskeletal: Positive for arthralgias (chronic shoulder pain) and back pain (chronic,unchanged). Negative for gait problem, joint swelling, myalgias, neck pain and neck stiffness.  Skin: Negative.   Allergic/Immunologic: Negative.   Neurological: Negative.  Negative for dizziness, tremors, weakness and light-headedness.  Hematological: Negative.  Negative for adenopathy. Does not bruise/bleed easily.  Psychiatric/Behavioral: Positive for sleep disturbance. Negative for suicidal ideas, hallucinations, behavioral problems, confusion, self-injury, dysphoric mood and decreased concentration. The patient is nervous/anxious. The patient is not hyperactive.        Objective:   Physical Exam  Vitals reviewed. Constitutional: He is oriented to person, place, and time. He appears well-developed and well-nourished. No distress.  HENT:  Head: Normocephalic and atraumatic.  Mouth/Throat: Oropharynx is clear and moist. No oropharyngeal exudate.  Eyes: Conjunctivae are normal. Right eye exhibits no discharge. Left eye exhibits no discharge. No scleral icterus.  Neck: Normal range of motion. Neck supple. No JVD present. No tracheal deviation present. No thyromegaly present.  Cardiovascular: Normal rate, regular rhythm, normal heart sounds and intact distal pulses.  Exam reveals no gallop and no friction rub.   No murmur heard. Pulmonary/Chest: Effort normal and breath sounds normal. No stridor. No respiratory distress. He has no wheezes. He has no rales. He exhibits no tenderness.  Abdominal: Soft. Bowel sounds are normal. He exhibits no distension and no mass. There is no tenderness. There is no rebound and no guarding. Hernia confirmed negative in the right inguinal area and confirmed negative in the left inguinal area.  Genitourinary: Rectum normal and testes normal. Rectal exam shows  no external hemorrhoid, no internal hemorrhoid, no fissure, no mass, no tenderness and anal tone normal. Guaiac negative stool. Prostate is not  enlarged and not tender. Right testis shows no mass, no swelling and no tenderness. Right testis is descended. Left testis shows no mass, no swelling and no tenderness. Left testis is descended. Circumcised. Hypospadias present. No phimosis, paraphimosis, penile erythema or penile tenderness. No discharge found.  Musculoskeletal: Normal range of motion. He exhibits no edema and no tenderness.  Lymphadenopathy:    He has no cervical adenopathy.       Right: No inguinal adenopathy present.       Left: No inguinal adenopathy present.  Neurological: He is oriented to person, place, and time.  Skin: Skin is warm and dry. No rash noted. He is not diaphoretic. No erythema. No pallor.  Psychiatric: He has a normal mood and affect. His behavior is normal. Judgment and thought content normal.     Lab Results  Component Value Date   WBC 3.9* 08/08/2012   HGB 16.0 08/08/2012   HCT 45.1 08/08/2012   PLT 148* 08/08/2012   GLUCOSE 178* 08/08/2012   ALT 20 08/08/2012   AST 17 08/08/2012   NA 139 08/08/2012   K 3.9 08/08/2012   CL 105 08/08/2012   CREATININE 1.06 08/08/2012   BUN 13 08/08/2012   CO2 26 08/08/2012   INR 1.0 01/18/2007       Assessment & Plan:

## 2013-01-11 NOTE — Assessment & Plan Note (Signed)
I will check his A1C and will address if needed 

## 2013-01-11 NOTE — Telephone Encounter (Signed)
Pt will stop bu to pick up written RX

## 2013-01-11 NOTE — Assessment & Plan Note (Signed)
His EKG is normal but he has risk factors for CAD so I have ordered an ETT

## 2013-01-11 NOTE — Assessment & Plan Note (Signed)
Exam done Vaccines were addressed Labs ordered Pt ed material was given 

## 2013-01-11 NOTE — Assessment & Plan Note (Signed)
I have asked him to stop the opiates to see if that will help but he refuses to do this I will check his labs today to look for secondary causes He will try cialis

## 2013-01-11 NOTE — Assessment & Plan Note (Signed)
He will continue the current meds for pain I will check his UDS

## 2013-01-11 NOTE — Patient Instructions (Signed)
Health Maintenance, Males A healthy lifestyle and preventative care can promote health and wellness.  Maintain regular health, dental, and eye exams.  Eat a healthy diet. Foods like vegetables, fruits, whole grains, low-fat dairy products, and lean protein foods contain the nutrients you need without too many calories. Decrease your intake of foods high in solid fats, added sugars, and salt. Get information about a proper diet from your caregiver, if necessary.  Regular physical exercise is one of the most important things you can do for your health. Most adults should get at least 150 minutes of moderate-intensity exercise (any activity that increases your heart rate and causes you to sweat) each week. In addition, most adults need muscle-strengthening exercises on 2 or more days a week.   Maintain a healthy weight. The body mass index (BMI) is a screening tool to identify possible weight problems. It provides an estimate of body fat based on height and weight. Your caregiver can help determine your BMI, and can help you achieve or maintain a healthy weight. For adults 20 years and older:  A BMI below 18.5 is considered underweight.  A BMI of 18.5 to 24.9 is normal.  A BMI of 25 to 29.9 is considered overweight.  A BMI of 30 and above is considered obese.  Maintain normal blood lipids and cholesterol by exercising and minimizing your intake of saturated fat. Eat a balanced diet with plenty of fruits and vegetables. Blood tests for lipids and cholesterol should begin at age 20 and be repeated every 5 years. If your lipid or cholesterol levels are high, you are over 50, or you are a high risk for heart disease, you may need your cholesterol levels checked more frequently.Ongoing high lipid and cholesterol levels should be treated with medicines, if diet and exercise are not effective.  If you smoke, find out from your caregiver how to quit. If you do not use tobacco, do not start.  If you  choose to drink alcohol, do not exceed 2 drinks per day. One drink is considered to be 12 ounces (355 mL) of beer, 5 ounces (148 mL) of wine, or 1.5 ounces (44 mL) of liquor.  Avoid use of street drugs. Do not share needles with anyone. Ask for help if you need support or instructions about stopping the use of drugs.  High blood pressure causes heart disease and increases the risk of stroke. Blood pressure should be checked at least every 1 to 2 years. Ongoing high blood pressure should be treated with medicines if weight loss and exercise are not effective.  If you are 45 to 46 years old, ask your caregiver if you should take aspirin to prevent heart disease.  Diabetes screening involves taking a blood sample to check your fasting blood sugar level. This should be done once every 3 years, after age 45, if you are within normal weight and without risk factors for diabetes. Testing should be considered at a younger age or be carried out more frequently if you are overweight and have at least 1 risk factor for diabetes.  Colorectal cancer can be detected and often prevented. Most routine colorectal cancer screening begins at the age of 50 and continues through age 75. However, your caregiver may recommend screening at an earlier age if you have risk factors for colon cancer. On a yearly basis, your caregiver may provide home test kits to check for hidden blood in the stool. Use of a small camera at the end of a tube,   to directly examine the colon (sigmoidoscopy or colonoscopy), can detect the earliest forms of colorectal cancer. Talk to your caregiver about this at age 50, when routine screening begins. Direct examination of the colon should be repeated every 5 to 10 years through age 75, unless early forms of pre-cancerous polyps or small growths are found.  Hepatitis C blood testing is recommended for all people born from 1945 through 1965 and any individual with known risks for hepatitis C.  Healthy  men should no longer receive prostate-specific antigen (PSA) blood tests as part of routine cancer screening. Consult with your caregiver about prostate cancer screening.  Testicular cancer screening is not recommended for adolescents or adult males who have no symptoms. Screening includes self-exam, caregiver exam, and other screening tests. Consult with your caregiver about any symptoms you have or any concerns you have about testicular cancer.  Practice safe sex. Use condoms and avoid high-risk sexual practices to reduce the spread of sexually transmitted infections (STIs).  Use sunscreen with a sun protection factor (SPF) of 30 or greater. Apply sunscreen liberally and repeatedly throughout the day. You should seek shade when your shadow is shorter than you. Protect yourself by wearing long sleeves, pants, a wide-brimmed hat, and sunglasses year round, whenever you are outdoors.  Notify your caregiver of new moles or changes in moles, especially if there is a change in shape or color. Also notify your caregiver if a mole is larger than the size of a pencil eraser.  A one-time screening for abdominal aortic aneurysm (AAA) and surgical repair of large AAAs by sound wave imaging (ultrasonography) is recommended for ages 65 to 75 years who are current or former smokers.  Stay current with your immunizations. Document Released: 09/12/2007 Document Revised: 06/08/2011 Document Reviewed: 08/11/2010 [employer] Patient Information 2014 [employer]. Type 2 Diabetes Mellitus, Adult Type 2 diabetes mellitus, often simply referred to as type 2 diabetes, is a long-lasting (chronic) disease. In type 2 diabetes, the pancreas does not make enough insulin (a hormone), the cells are less responsive to the insulin that is made (insulin resistance), or both. Normally, insulin moves sugars from food into the tissue cells. The tissue cells use the sugars for energy. The lack of insulin or the lack of normal response  to insulin causes excess sugars to build up in the blood instead of going into the tissue cells. As a result, high blood sugar (hyperglycemia) develops. The effect of high sugar (glucose) levels can cause many complications. Type 2 diabetes was also previously called adult-onset diabetes but it can occur at any age.  RISK FACTORS  A person is predisposed to developing type 2 diabetes if someone in the family has the disease and also has one or more of the following primary risk factors:  Overweight.  An inactive lifestyle.  A history of consistently eating high-calorie foods. Maintaining a normal weight and regular physical activity can reduce the chance of developing type 2 diabetes. SYMPTOMS  A person with type 2 diabetes may not show symptoms initially. The symptoms of type 2 diabetes appear slowly. The symptoms include:  Increased thirst (polydipsia).  Increased urination (polyuria).  Increased urination during the night (nocturia).  Weight loss. This weight loss may be rapid.  Frequent, recurring infections.  Tiredness (fatigue).  Weakness.  Vision changes, such as blurred vision.  Fruity smell to your breath.  Abdominal pain.  Nausea or vomiting.  Cuts or bruises which are slow to heal.  Tingling or numbness in the hands   or feet. DIAGNOSIS Type 2 diabetes is frequently not diagnosed until complications of diabetes are present. Type 2 diabetes is diagnosed when symptoms or complications are present and when blood glucose levels are increased. Your blood glucose level may be checked by one or more of the following blood tests:  A fasting blood glucose test. You will not be allowed to eat for at least 8 hours before a blood sample is taken.  A random blood glucose test. Your blood glucose is checked at any time of the day regardless of when you ate.  A hemoglobin A1c blood glucose test. A hemoglobin A1c test provides information about blood glucose control over the  previous 3 months.  An oral glucose tolerance test (OGTT). Your blood glucose is measured after you have not eaten (fasted) for 2 hours and then after you drink a glucose-containing beverage. TREATMENT   You may need to take insulin or diabetes medicine daily to keep blood glucose levels in the desired range.  You will need to match insulin dosing with exercise and healthy food choices. The treatment goal is to maintain the before meal blood sugar (preprandial glucose) level at 70 130 mg/dL. HOME CARE INSTRUCTIONS   Have your hemoglobin A1c level checked twice a year.  Perform daily blood glucose monitoring as directed by your caregiver.  Monitor urine ketones when you are ill and as directed by your caregiver.  Take your diabetes medicine or insulin as directed by your caregiver to maintain your blood glucose levels in the desired range.  Never run out of diabetes medicine or insulin. It is needed every day.  Adjust insulin based on your intake of carbohydrates. Carbohydrates can raise blood glucose levels but need to be included in your diet. Carbohydrates provide vitamins, minerals, and fiber which are an essential part of a healthy diet. Carbohydrates are found in fruits, vegetables, whole grains, dairy products, legumes, and foods containing added sugars.    Eat healthy foods. Alternate 3 meals with 3 snacks.  Lose weight if overweight.  Carry a medical alert card or wear your medical alert jewelry.  Carry a 15 gram carbohydrate snack with you at all times to treat low blood glucose (hypoglycemia). Some examples of 15 gram carbohydrate snacks include:  Glucose tablets, 3 or 4   Glucose gel, 15 gram tube  Raisins, 2 tablespoons (24 grams)  Jelly beans, 6  Animal crackers, 8  Regular pop, 4 ounces (120 mL)  Gummy treats, 9  Recognize hypoglycemia. Hypoglycemia occurs with blood glucose levels of 70 mg/dL and below. The risk for hypoglycemia increases when fasting or  skipping meals, during or after intense exercise, and during sleep. Hypoglycemia symptoms can include:  Tremors or shakes.  Decreased ability to concentrate.  Sweating.  Increased heart rate.  Headache.  Dry mouth.  Hunger.  Irritability.  Anxiety.  Restless sleep.  Altered speech or coordination.  Confusion.  Treat hypoglycemia promptly. If you are alert and able to safely swallow, follow the 15:15 rule:  Take 15 20 grams of rapid-acting glucose or carbohydrate. Rapid-acting options include glucose gel, glucose tablets, or 4 ounces (120 mL) of fruit juice, regular soda, or low fat milk.  Check your blood glucose level 15 minutes after taking the glucose.  Take 15 20 grams more of glucose if the repeat blood glucose level is still 70 mg/dL or below.  Eat a meal or snack within 1 hour once blood glucose levels return to normal.    Be alert to polyuria   and polydipsia which are early signs of hyperglycemia. An early awareness of hyperglycemia allows for prompt treatment. Treat hyperglycemia as directed by your caregiver.  Engage in at least 150 minutes of moderate-intensity physical activity a week, spread over at least 3 days of the week or as directed by your caregiver. In addition, you should engage in resistance exercise at least 2 times a week or as directed by your caregiver.  Adjust your medicine and food intake as needed if you start a new exercise or sport.  Follow your sick day plan at any time you are unable to eat or drink as usual.  Avoid tobacco use.  Limit alcohol intake to no more than 1 drink per day for nonpregnant women and 2 drinks per day for men. You should drink alcohol only when you are also eating food. Talk with your caregiver whether alcohol is safe for you. Tell your caregiver if you drink alcohol several times a week.  Follow up with your caregiver regularly.  Schedule an eye exam soon after the diagnosis of type 2 diabetes and then  annually.  Perform daily skin and foot care. Examine your skin and feet daily for cuts, bruises, redness, nail problems, bleeding, blisters, or sores. A foot exam by a caregiver should be done annually.  Brush your teeth and gums at least twice a day and floss at least once a day. Follow up with your dentist regularly.  Share your diabetes management plan with your workplace or school.  Stay up-to-date with immunizations.  Learn to manage stress.  Obtain ongoing diabetes education and support as needed.  Participate in, or seek rehabilitation as needed to maintain or improve independence and quality of life. Request a physical or occupational therapy referral if you are having foot or hand numbness or difficulties with grooming, dressing, eating, or physical activity. SEEK MEDICAL CARE IF:   You are unable to eat food or drink fluids for more than 6 hours.  You have nausea and vomiting for more than 6 hours.  Your blood glucose level is over 240 mg/dL.  There is a change in mental status.  You develop an additional serious illness.  You have diarrhea for more than 6 hours.  You have been sick or have had a fever for a couple of days and are not getting better.  You have pain during any physical activity.  SEEK IMMEDIATE MEDICAL CARE IF:  You have difficulty breathing.  You have moderate to large ketone levels. MAKE SURE YOU:  Understand these instructions.  Will watch your condition.  Will get help right away if you are not doing well or get worse. Document Released: 03/16/2005 Document Revised: 12/09/2011 Document Reviewed: 10/13/2011 [employer] Patient Information 2014 [employer].  

## 2013-01-11 NOTE — Assessment & Plan Note (Signed)
Will continue the current meds for pain 

## 2013-01-11 NOTE — Assessment & Plan Note (Signed)
I will check his renal function and UA I have asked him to start an ACEI

## 2013-01-11 NOTE — Telephone Encounter (Signed)
Pt request rx for Cialis to be sent to local pharmacy as well as a printed one to send off to mail order. Please advise on dose and signature Thanks

## 2013-01-12 LAB — DRUGS OF ABUSE SCREEN W/O ALC, ROUTINE URINE
Amphetamine Screen, Ur: NEGATIVE
Benzodiazepines.: POSITIVE — AB
Marijuana Metabolite: NEGATIVE
Methadone: NEGATIVE
Opiate Screen, Urine: NEGATIVE

## 2013-01-16 ENCOUNTER — Encounter: Payer: Self-pay | Admitting: Internal Medicine

## 2013-01-16 LAB — BENZODIAZEPINES (GC/LC/MS), URINE
Alprazolam (GC/LC/MS), ur confirm: 89 ng/mL — ABNORMAL HIGH
Clonazepam metabolite (GC/LC/MS), ur confirm: NEGATIVE ng/mL
Flunitrazepam metabolite (GC/LC/MS), ur confirm: NEGATIVE ng/mL
Lorazepam (GC/LC/MS), ur confirm: NEGATIVE ng/mL
Midazolam (GC/LC/MS), ur confirm: NEGATIVE ng/mL
Nordiazepam (GC/LC/MS), ur confirm: NEGATIVE ng/mL
Oxazepam (GC/LC/MS), ur confirm: NEGATIVE ng/mL
Temazepam (GC/LC/MS), ur confirm: NEGATIVE ng/mL

## 2013-01-17 ENCOUNTER — Encounter: Payer: Self-pay | Admitting: Internal Medicine

## 2013-01-17 LAB — MICROALBUMIN / CREATININE URINE RATIO
Creatinine,U: 203.1 mg/dL
Microalb Creat Ratio: 0.3 mg/g (ref 0.0–30.0)
Microalb, Ur: 0.6 mg/dL (ref 0.0–1.9)

## 2013-01-23 ENCOUNTER — Telehealth: Payer: Self-pay | Admitting: Internal Medicine

## 2013-01-23 ENCOUNTER — Encounter: Payer: Self-pay | Admitting: Internal Medicine

## 2013-01-23 NOTE — Telephone Encounter (Signed)
Pt schedule an appt with Dr. Yetta Barre 01/30/13. Cancel this request.

## 2013-01-23 NOTE — Telephone Encounter (Signed)
Pt called request to be seen by Dr. Jonny Ruiz after hour. Pt stated that he need to follow up on med and diabetes, but he can not get off work before 5 pm to see Dr. Yetta Barre that's why he request if he can see Dr. Jonny Ruiz. Pt last saw Dr. Yetta Barre was 01/11/13. Inform pt that follow up need to be with his PCP and we will ask Dr. Jonny Ruiz about this. Please advise.

## 2013-01-30 ENCOUNTER — Ambulatory Visit (INDEPENDENT_AMBULATORY_CARE_PROVIDER_SITE_OTHER): Payer: BC Managed Care – PPO | Admitting: Internal Medicine

## 2013-01-30 ENCOUNTER — Encounter: Payer: Self-pay | Admitting: Internal Medicine

## 2013-01-30 VITALS — BP 130/86 | HR 80 | Temp 97.8°F | Resp 16 | Ht 70.5 in | Wt 190.0 lb

## 2013-01-30 DIAGNOSIS — F341 Dysthymic disorder: Secondary | ICD-10-CM

## 2013-01-30 DIAGNOSIS — F418 Other specified anxiety disorders: Secondary | ICD-10-CM | POA: Insufficient documentation

## 2013-01-30 DIAGNOSIS — A499 Bacterial infection, unspecified: Secondary | ICD-10-CM

## 2013-01-30 DIAGNOSIS — J329 Chronic sinusitis, unspecified: Secondary | ICD-10-CM

## 2013-01-30 DIAGNOSIS — B9689 Other specified bacterial agents as the cause of diseases classified elsewhere: Secondary | ICD-10-CM | POA: Insufficient documentation

## 2013-01-30 MED ORDER — TRAZODONE HCL 50 MG PO TABS: 50.0000 mg | ORAL_TABLET | Freq: Every day | ORAL | Status: DC

## 2013-01-30 MED ORDER — AMOXICILLIN 875 MG PO TABS: 875.0000 mg | ORAL_TABLET | Freq: Two times a day (BID) | ORAL | Status: DC

## 2013-01-30 NOTE — Progress Notes (Signed)
Subjective:    Patient ID: Jacob [name], male    DOB: 1966-01-04, 47 y.o.   MRN: [id number]  Sinusitis This is a recurrent problem. The current episode started 1 to 4 weeks ago. The problem is unchanged. There has been no fever. The fever has been present for less than 1 day. His pain is at a severity of 0/10. He is experiencing no pain. Associated symptoms include congestion, ear pain and sinus pressure. Pertinent negatives include no chills, coughing, diaphoresis, headaches, hoarse voice, neck pain, shortness of breath, sneezing, sore throat or swollen glands. Past treatments include nothing.      Review of Systems  Constitutional: Negative.  Negative for fever, chills, diaphoresis, activity change, appetite change, fatigue and unexpected weight change.  HENT: Positive for congestion, ear pain and sinus pressure. Negative for ear discharge, facial swelling, hoarse voice, sneezing, sore throat, trouble swallowing and voice change.   Eyes: Negative.   Respiratory: Negative.  Negative for apnea, cough, choking, chest tightness, shortness of breath, wheezing and stridor.   Cardiovascular: Negative.  Negative for chest pain, palpitations and leg swelling.  Gastrointestinal: Negative.  Negative for nausea, vomiting, abdominal pain, diarrhea, constipation and blood in stool.  Endocrine: Negative.   Genitourinary: Negative.   Musculoskeletal: Negative.  Negative for arthralgias, back pain, gait problem, joint swelling, myalgias, neck pain and neck stiffness.  Skin: Negative.   Allergic/Immunologic: Negative.   Neurological: Negative.  Negative for headaches.  Hematological: Negative.  Negative for adenopathy. Does not bruise/bleed easily.  Psychiatric/Behavioral: Positive for sleep disturbance. Negative for suicidal ideas, hallucinations, behavioral problems, confusion, self-injury, dysphoric mood, decreased concentration and agitation. The patient is nervous/anxious. The patient is not  hyperactive.        Objective:   Physical Exam  Constitutional: He is oriented to person, place, and time. He appears well-developed and well-nourished.  Non-toxic appearance. He does not have a sickly appearance. He does not appear ill. No distress.  HENT:  Head: Normocephalic and atraumatic.  Right Ear: Hearing, external ear and ear canal normal. No lacerations. No drainage, swelling or tenderness. No foreign bodies. No mastoid tenderness. Tympanic membrane is injected, scarred, erythematous and bulging. Tympanic membrane is not perforated and not retracted. Tympanic membrane mobility is normal. No middle ear effusion. No hemotympanum. No decreased hearing is noted.  Left Ear: Hearing, tympanic membrane, external ear and ear canal normal.  Nose: No mucosal edema or rhinorrhea. Right sinus exhibits no maxillary sinus tenderness and no frontal sinus tenderness. Left sinus exhibits no maxillary sinus tenderness and no frontal sinus tenderness.  Mouth/Throat: Oropharynx is clear and moist and mucous membranes are normal. Mucous membranes are not pale, not dry and not cyanotic. No oral lesions. No trismus in the jaw. No uvula swelling. No oropharyngeal exudate, posterior oropharyngeal edema, posterior oropharyngeal erythema or tonsillar abscesses.  Eyes: Conjunctivae are normal. Right eye exhibits no discharge. Left eye exhibits no discharge. No scleral icterus.  Neck: Normal range of motion. Neck supple. No JVD present. No tracheal deviation present. No thyromegaly present.  Cardiovascular: Normal rate, regular rhythm, normal heart sounds and intact distal pulses.  Exam reveals no gallop and no friction rub.   No murmur heard. Pulmonary/Chest: Effort normal and breath sounds normal. No stridor. No respiratory distress. He has no wheezes. He has no rales. He exhibits no tenderness.  Abdominal: Soft. Bowel sounds are normal. He exhibits no distension and no mass. There is no tenderness. There is no  rebound and no guarding.  Musculoskeletal:  Normal range of motion. He exhibits no edema and no tenderness.  Lymphadenopathy:    He has no cervical adenopathy.  Neurological: He is oriented to person, place, and time.  Skin: Skin is warm and dry. No rash noted. He is not diaphoretic. No erythema. No pallor.     Lab Results  Component Value Date   WBC 4.2* 01/11/2013   HGB 16.3 01/11/2013   HCT 46.9 01/11/2013   PLT 213.0 01/11/2013   GLUCOSE 135* 01/11/2013   CHOL 186 01/11/2013   TRIG 115.0 01/11/2013   HDL 38.80* 01/11/2013   LDLCALC 124* 01/11/2013   ALT 22 01/11/2013   AST 22 01/11/2013   NA 141 01/11/2013   K 4.7 01/11/2013   CL 104 01/11/2013   CREATININE 1.1 01/11/2013   BUN 12 01/11/2013   CO2 30 01/11/2013   TSH 2.13 01/11/2013   PSA 0.92 01/11/2013   INR 1.0 01/18/2007   HGBA1C 6.8* 01/11/2013   MICROALBUR 0.6 01/11/2013       Assessment & Plan:

## 2013-01-30 NOTE — Patient Instructions (Signed)

## 2013-01-30 NOTE — Assessment & Plan Note (Signed)
I will treat the infection with Amoxil

## 2013-01-30 NOTE — Assessment & Plan Note (Signed)
He will try to taper off of xanax and will start trazodone for insomnia

## 2013-01-30 NOTE — Assessment & Plan Note (Signed)
His blood sugar is well controlled 

## 2013-01-31 ENCOUNTER — Telehealth: Payer: Self-pay | Admitting: Physician Assistant

## 2013-01-31 NOTE — Telephone Encounter (Signed)
New Problem:  Pt states, he would like to know what the total cost for an exercise treadmill test is... Pt states he will have to pay for it all out of pocket. Please let the pt know how much the study cost.

## 2013-02-16 ENCOUNTER — Encounter: Payer: BC Managed Care – PPO | Admitting: Physician Assistant

## 2013-02-16 ENCOUNTER — Encounter: Payer: Self-pay | Admitting: Internal Medicine

## 2013-02-21 ENCOUNTER — Telehealth: Payer: Self-pay | Admitting: Radiology

## 2013-02-21 NOTE — Telephone Encounter (Signed)
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

## 2013-03-06 ENCOUNTER — Telehealth: Payer: Self-pay

## 2013-03-06 NOTE — Telephone Encounter (Signed)
Pt called stating that he has misplaced his last Rx for hydrocodone dated 03/12/13. He would like to know if MD would replace and also allow him to fill early. Please advise

## 2013-03-06 NOTE — Telephone Encounter (Signed)
no

## 2013-03-06 NOTE — Telephone Encounter (Signed)
Pt notified per understand

## 2013-03-24 ENCOUNTER — Telehealth: Payer: Self-pay

## 2013-03-24 NOTE — Telephone Encounter (Signed)
The patient called and stated he was hurt at work and was sent to urgent care.  The urgent care doctor gave him advice to have an MRI done.  The patient called and stated he did not want to take time off work for this procedure.  I offered him several appointments with Dr.Jones, however, the patient refused stating he would follow up with the urgent care dr.

## 2013-04-03 ENCOUNTER — Encounter: Payer: Self-pay | Admitting: Internal Medicine

## 2013-04-28 ENCOUNTER — Telehealth: Payer: Self-pay

## 2013-04-28 NOTE — Telephone Encounter (Signed)
Pt would like to know if MRI results have came in yet. Best# [phone]

## 2013-04-28 NOTE — Telephone Encounter (Signed)
Patient calling for his MRI results  [phone]

## 2013-04-29 NOTE — Telephone Encounter (Signed)
[employer], clinical.

## 2013-07-17 ENCOUNTER — Ambulatory Visit (INDEPENDENT_AMBULATORY_CARE_PROVIDER_SITE_OTHER): Payer: No Typology Code available for payment source

## 2013-07-17 ENCOUNTER — Ambulatory Visit (INDEPENDENT_AMBULATORY_CARE_PROVIDER_SITE_OTHER): Payer: No Typology Code available for payment source | Admitting: Internal Medicine

## 2013-07-17 ENCOUNTER — Encounter: Payer: Self-pay | Admitting: Internal Medicine

## 2013-07-17 VITALS — BP 138/88 | HR 62 | Temp 97.2°F | Resp 16 | Ht 70.5 in | Wt 194.2 lb

## 2013-07-17 DIAGNOSIS — I10 Essential (primary) hypertension: Secondary | ICD-10-CM

## 2013-07-17 DIAGNOSIS — M1990 Unspecified osteoarthritis, unspecified site: Secondary | ICD-10-CM

## 2013-07-17 DIAGNOSIS — F418 Other specified anxiety disorders: Secondary | ICD-10-CM

## 2013-07-17 DIAGNOSIS — IMO0001 Reserved for inherently not codable concepts without codable children: Secondary | ICD-10-CM

## 2013-07-17 DIAGNOSIS — R0600 Dyspnea, unspecified: Secondary | ICD-10-CM

## 2013-07-17 DIAGNOSIS — F341 Dysthymic disorder: Secondary | ICD-10-CM

## 2013-07-17 DIAGNOSIS — E1165 Type 2 diabetes mellitus with hyperglycemia: Principal | ICD-10-CM

## 2013-07-17 DIAGNOSIS — R0989 Other specified symptoms and signs involving the circulatory and respiratory systems: Secondary | ICD-10-CM

## 2013-07-17 DIAGNOSIS — M545 Low back pain, unspecified: Secondary | ICD-10-CM

## 2013-07-17 DIAGNOSIS — R0609 Other forms of dyspnea: Secondary | ICD-10-CM

## 2013-07-17 LAB — URINALYSIS, ROUTINE W REFLEX MICROSCOPIC
BILIRUBIN URINE: NEGATIVE
Hgb urine dipstick: NEGATIVE
KETONES UR: NEGATIVE
Leukocytes, UA: NEGATIVE
Nitrite: NEGATIVE
RBC / HPF: NONE SEEN (ref 0–?)
Specific Gravity, Urine: 1.02 (ref 1.000–1.030)
Total Protein, Urine: NEGATIVE
UROBILINOGEN UA: 1 (ref 0.0–1.0)
Urine Glucose: 100 — AB
pH: 7 (ref 5.0–8.0)

## 2013-07-17 LAB — HEMOGLOBIN A1C: Hgb A1c MFr Bld: 6.7 % — ABNORMAL HIGH (ref 4.6–6.5)

## 2013-07-17 MED ORDER — HYDROCODONE-ACETAMINOPHEN 7.5-325 MG PO TABS: 1.0000 | ORAL_TABLET | Freq: Four times a day (QID) | ORAL | Status: DC | PRN

## 2013-07-17 MED ORDER — ALPRAZOLAM 0.5 MG PO TABS
0.2500 mg | ORAL_TABLET | Freq: Every evening | ORAL | Status: DC | PRN
Start: 2013-07-17 — End: 2013-10-25

## 2013-07-17 MED ORDER — METFORMIN HCL ER 500 MG PO TB24: 500.0000 mg | ORAL_TABLET | Freq: Every day | ORAL | Status: DC

## 2013-07-17 NOTE — Assessment & Plan Note (Signed)
His BP is adequately well controlled I will monitor his lytes and renal function today 

## 2013-07-17 NOTE — Progress Notes (Signed)
Subjective:    Patient ID: Jacob [name], male    DOB: Aug 14, 1965, 48 y.o.   MRN: [id number]  HPI Comments: When I last saw him he was having some SOB so I ordered an ETT but he was not willing to pay the cost for that. Today he tells me that the SOB has resolved but he still wants to get the ETT done.  Hypertension This is a chronic problem. The current episode started more than 1 year ago. The problem has been gradually improving since onset. The problem is controlled. Associated symptoms include anxiety. Pertinent negatives include no blurred vision, chest pain, headaches, malaise/fatigue, neck pain, orthopnea, palpitations, peripheral edema, PND, shortness of breath or sweats. Agents associated with hypertension include NSAIDs. Past treatments include ACE inhibitors. The current treatment provides moderate improvement. Compliance problems include exercise and diet.       Review of Systems  Constitutional: Negative.  Negative for fever, chills, malaise/fatigue, diaphoresis, appetite change and fatigue.  HENT: Negative.   Eyes: Negative.  Negative for blurred vision.  Respiratory: Negative.  Negative for cough, choking, chest tightness, shortness of breath, wheezing and stridor.   Cardiovascular: Negative.  Negative for chest pain, palpitations, orthopnea, leg swelling and PND.  Gastrointestinal: Negative.  Negative for nausea, vomiting, abdominal pain, diarrhea, constipation and blood in stool.  Endocrine: Negative.  Negative for polydipsia, polyphagia and polyuria.  Genitourinary: Negative.   Musculoskeletal: Positive for back pain. Negative for arthralgias, gait problem, joint swelling, myalgias, neck pain and neck stiffness.       He has mild intermittent aching in his lower back, the pain does not radiate into his legs and there has not been any numbness,weakness, or tingling. He occasionally takes motrin and norco and the pain resolves.  Skin: Negative.   Allergic/Immunologic:  Negative.   Neurological: Negative.  Negative for dizziness, tremors, weakness, light-headedness and headaches.  Hematological: Negative.  Negative for adenopathy. Does not bruise/bleed easily.  Psychiatric/Behavioral: Positive for sleep disturbance. Negative for suicidal ideas, hallucinations, behavioral problems, confusion, self-injury, dysphoric mood, decreased concentration and agitation. The patient is nervous/anxious. The patient is not hyperactive.        Objective:   Physical Exam  Vitals reviewed. Constitutional: He is oriented to person, place, and time. He appears well-developed and well-nourished. No distress.  HENT:  Head: Normocephalic and atraumatic.  Mouth/Throat: Oropharynx is clear and moist. No oropharyngeal exudate.  Eyes: Conjunctivae are normal. Right eye exhibits no discharge. Left eye exhibits no discharge. No scleral icterus.  Neck: Normal range of motion. Neck supple. No JVD present. No tracheal deviation present. No thyromegaly present.  Cardiovascular: Normal rate, regular rhythm, normal heart sounds and intact distal pulses.  Exam reveals no gallop and no friction rub.   No murmur heard. Pulmonary/Chest: Effort normal and breath sounds normal. No stridor. No respiratory distress. He has no wheezes. He has no rales. He exhibits no tenderness.  Abdominal: Soft. Bowel sounds are normal. He exhibits no distension and no mass. There is no tenderness. There is no rebound and no guarding.  Musculoskeletal: Normal range of motion. He exhibits no edema and no tenderness.       Thoracic back: Normal.       Lumbar back: Normal. He exhibits normal range of motion, no tenderness, no bony tenderness, no swelling, no edema, no deformity, no laceration, no pain, no spasm and normal pulse.  Lymphadenopathy:    He has no cervical adenopathy.  Neurological: He is alert and oriented  to person, place, and time. He has normal strength. He displays no atrophy, no tremor and normal  reflexes. No cranial nerve deficit or sensory deficit. He exhibits normal muscle tone. He displays a negative Romberg sign. He displays no seizure activity. Coordination and gait normal.  Reflex Scores:      Tricep reflexes are 1+ on the right side and 1+ on the left side.      Bicep reflexes are 1+ on the right side and 1+ on the left side.      Brachioradialis reflexes are 1+ on the right side and 1+ on the left side.      Patellar reflexes are 1+ on the right side and 1+ on the left side.      Achilles reflexes are 1+ on the right side and 1+ on the left side. Neg SLR in BLE  Skin: Skin is warm and dry. No rash noted. He is not diaphoretic. No erythema. No pallor.      Lab Results  Component Value Date   WBC 4.2* 01/11/2013   HGB 16.3 01/11/2013   HCT 46.9 01/11/2013   PLT 213.0 01/11/2013   GLUCOSE 135* 01/11/2013   CHOL 186 01/11/2013   TRIG 115.0 01/11/2013   HDL 38.80* 01/11/2013   LDLCALC 124* 01/11/2013   ALT 22 01/11/2013   AST 22 01/11/2013   NA 141 01/11/2013   K 4.7 01/11/2013   CL 104 01/11/2013   CREATININE 1.1 01/11/2013   BUN 12 01/11/2013   CO2 30 01/11/2013   TSH 2.13 01/11/2013   PSA 0.92 01/11/2013   INR 1.0 01/18/2007   HGBA1C 6.8* 01/11/2013   MICROALBUR 0.6 01/11/2013      Assessment & Plan:

## 2013-07-17 NOTE — Assessment & Plan Note (Signed)
I will recheck his A1C and will advise further 

## 2013-07-17 NOTE — Patient Instructions (Signed)

## 2013-07-17 NOTE — Assessment & Plan Note (Signed)
He will cont norco as needed

## 2013-07-17 NOTE — Assessment & Plan Note (Signed)
I will check his UDS to see that he is compliant with his meds and to screen for substance abuse He will cont xanax as needed for now

## 2013-07-17 NOTE — Assessment & Plan Note (Signed)
I have reordered the ETT

## 2013-07-17 NOTE — Progress Notes (Signed)
Pre visit review using our clinic review tool, if applicable. No additional management support is needed unless otherwise documented below in the visit note. 

## 2013-07-18 ENCOUNTER — Telehealth: Payer: Self-pay | Admitting: Internal Medicine

## 2013-07-18 ENCOUNTER — Encounter: Payer: Self-pay | Admitting: Internal Medicine

## 2013-07-18 LAB — BASIC METABOLIC PANEL
BUN: 12 mg/dL (ref 6–23)
CO2: 27 mEq/L (ref 19–32)
CREATININE: 1.2 mg/dL (ref 0.4–1.5)
Calcium: 9.2 mg/dL (ref 8.4–10.5)
Chloride: 106 mEq/L (ref 96–112)
GFR: 68.52 mL/min (ref >60.00)
Glucose, Bld: 175 mg/dL — ABNORMAL HIGH (ref 70–99)
Potassium: 4.7 mEq/L (ref 3.5–5.1)
Sodium: 143 mEq/L (ref 135–145)

## 2013-07-18 LAB — DRUGS OF ABUSE SCREEN W/O ALC, ROUTINE URINE
Amphetamine Screen, Ur: NEGATIVE
BENZODIAZEPINES.: POSITIVE — AB
Barbiturate Quant, Ur: NEGATIVE
COCAINE METABOLITES: NEGATIVE
CREATININE, U: 265 mg/dL
MARIJUANA METABOLITE: NEGATIVE
Methadone: NEGATIVE
OPIATE SCREEN, URINE: NEGATIVE
Phencyclidine (PCP): NEGATIVE
Propoxyphene: NEGATIVE

## 2013-07-18 NOTE — Telephone Encounter (Signed)
Relevant patient education assigned to patient using Emmi. ° °

## 2013-07-19 LAB — BENZODIAZEPINES (GC/LC/MS), URINE
Alprazolam (GC/LC/MS), ur confirm: 188 ng/mL
Alprazolam metabolite (GC/LC/MS), ur confirm: 133 ng/mL
Clonazepam metabolite (GC/LC/MS), ur confirm: NEGATIVE ng/mL
DIAZEPAMUC: NEGATIVE ng/mL
ESTAZOLAMU: NEGATIVE ng/mL
FLUNITRAZEPU: NEGATIVE ng/mL
Flurazepam metabolite (GC/LC/MS), ur confirm: NEGATIVE ng/mL
Lorazepam (GC/LC/MS), ur confirm: NEGATIVE ng/mL
Midazolam (GC/LC/MS), ur confirm: NEGATIVE ng/mL
NORDIAZEPAMU: NEGATIVE ng/mL
Oxazepam (GC/LC/MS), ur confirm: NEGATIVE ng/mL
TRIAZOLAMU: NEGATIVE ng/mL
Temazepam (GC/LC/MS), ur confirm: NEGATIVE ng/mL

## 2013-08-02 ENCOUNTER — Encounter (HOSPITAL_COMMUNITY): Payer: Self-pay

## 2013-08-07 ENCOUNTER — Encounter: Payer: Self-pay | Admitting: Nurse Practitioner

## 2013-08-07 ENCOUNTER — Ambulatory Visit (INDEPENDENT_AMBULATORY_CARE_PROVIDER_SITE_OTHER): Payer: No Typology Code available for payment source | Admitting: Nurse Practitioner

## 2013-08-07 VITALS — BP 124/77 | HR 75

## 2013-08-07 DIAGNOSIS — E119 Type 2 diabetes mellitus without complications: Secondary | ICD-10-CM

## 2013-08-07 DIAGNOSIS — I10 Essential (primary) hypertension: Secondary | ICD-10-CM

## 2013-08-07 DIAGNOSIS — R0600 Dyspnea, unspecified: Secondary | ICD-10-CM

## 2013-08-07 DIAGNOSIS — R0989 Other specified symptoms and signs involving the circulatory and respiratory systems: Secondary | ICD-10-CM

## 2013-08-07 DIAGNOSIS — R0609 Other forms of dyspnea: Secondary | ICD-10-CM

## 2013-08-07 NOTE — Addendum Note (Signed)
Addended by: Burtis Junes on: 08/07/2013 12:23 PM   Modules accepted: Level of Service

## 2013-08-07 NOTE — Progress Notes (Addendum)
Exercise Treadmill Test  Pre-Exercise Testing Evaluation Rhythm: normal sinus  Rate: 75     Test  Exercise Tolerance Test Ordering MD: Fransico Him, MD  Interpreting MD: Truitt Merle, NP  Unique Test No: 1  Treadmill:  1  Indication for ETT: DOE  Contraindication to ETT: No   Stress Modality: exercise - treadmill  Cardiac Imaging Performed: non   Protocol: standard Bruce - maximal  Max BP:  210/93  Max MPHR (bpm):  174 85% MPR (bpm):  148  MPHR obtained (bpm):  162 % MPHR obtained:  91%  Reached 85% MPHR (min:sec):  7:38 Total Exercise Time (min-sec):  10 minutes  Workload in METS:  11.7 Borg Scale: 16  Reason ETT Terminated:  patient's desire to stop    ST Segment Analysis At Rest: normal ST segments - no evidence of significant ST depression With Exercise: no evidence of significant ST depression  Other Information Arrhythmia:  No Angina during ETT:  absent (0) Quality of ETT:  diagnostic  ETT Interpretation:  normal - no evidence of ischemia by ST analysis  Comments: Patient presents today for routine GXT. Has had some atypical chest pain and shortness of breath. His symptoms have resolved with taking his PPI therapy. He has HTN and DM. No medicines taken today prior to today's study.   Today he exercised on the standard Bruce protocol for a total of 10 minutes. Excellent exercise tolerance. Adequate blood pressure response. Clinically negative.  Recommendations: CV risk factor modification.  See back prn.   Patient is agreeable to this plan and will call if any problems develop in the interim.   Burtis Junes, RN, [facility] [address] [place], [place]  [phone]

## 2013-08-07 NOTE — Addendum Note (Signed)
Addended by: Markus Daft A on: 08/07/2013 12:16 PM   Modules accepted: Level of Service

## 2013-08-22 ENCOUNTER — Telehealth: Payer: Self-pay | Admitting: Internal Medicine

## 2013-08-22 DIAGNOSIS — M545 Low back pain, unspecified: Secondary | ICD-10-CM

## 2013-08-22 DIAGNOSIS — M1990 Unspecified osteoarthritis, unspecified site: Secondary | ICD-10-CM

## 2013-08-22 MED ORDER — HYDROCODONE-ACETAMINOPHEN 7.5-325 MG PO TABS: 1.0000 | ORAL_TABLET | Freq: Four times a day (QID) | ORAL | Status: DC | PRN

## 2013-08-22 NOTE — Telephone Encounter (Signed)
2 months only. He will need to seen after that.

## 2013-08-22 NOTE — Telephone Encounter (Signed)
Pt request refill for hydrocodone, pt request 3 month supply so pt doesn't have to come in every month to pick this up. Please advise.

## 2013-08-23 NOTE — Telephone Encounter (Signed)
Patient notified, Rx placed upfront

## 2013-10-25 ENCOUNTER — Ambulatory Visit (INDEPENDENT_AMBULATORY_CARE_PROVIDER_SITE_OTHER): Payer: No Typology Code available for payment source | Admitting: Internal Medicine

## 2013-10-25 ENCOUNTER — Encounter: Payer: Self-pay | Admitting: Internal Medicine

## 2013-10-25 VITALS — BP 128/86 | HR 62 | Temp 98.1°F | Wt 191.4 lb

## 2013-10-25 DIAGNOSIS — H938X2 Other specified disorders of left ear: Secondary | ICD-10-CM

## 2013-10-25 DIAGNOSIS — F341 Dysthymic disorder: Secondary | ICD-10-CM

## 2013-10-25 DIAGNOSIS — E1165 Type 2 diabetes mellitus with hyperglycemia: Secondary | ICD-10-CM

## 2013-10-25 DIAGNOSIS — M150 Primary generalized (osteo)arthritis: Secondary | ICD-10-CM

## 2013-10-25 DIAGNOSIS — M545 Low back pain, unspecified: Secondary | ICD-10-CM

## 2013-10-25 DIAGNOSIS — H938X9 Other specified disorders of ear, unspecified ear: Secondary | ICD-10-CM

## 2013-10-25 DIAGNOSIS — IMO0001 Reserved for inherently not codable concepts without codable children: Secondary | ICD-10-CM

## 2013-10-25 DIAGNOSIS — F418 Other specified anxiety disorders: Secondary | ICD-10-CM

## 2013-10-25 DIAGNOSIS — M159 Polyosteoarthritis, unspecified: Secondary | ICD-10-CM

## 2013-10-25 DIAGNOSIS — I10 Essential (primary) hypertension: Secondary | ICD-10-CM

## 2013-10-25 MED ORDER — ALPRAZOLAM 0.5 MG PO TABS: 0.2500 mg | ORAL_TABLET | Freq: Every evening | ORAL | Status: DC | PRN

## 2013-10-25 MED ORDER — DICLOFENAC SODIUM 1 % TD GEL: 2.0000 g | Freq: Four times a day (QID) | TRANSDERMAL | Status: DC

## 2013-10-25 MED ORDER — SILDENAFIL CITRATE 20 MG PO TABS: ORAL_TABLET | ORAL | Status: DC

## 2013-10-25 MED ORDER — HYDROCODONE-ACETAMINOPHEN 7.5-325 MG PO TABS: 1.0000 | ORAL_TABLET | Freq: Four times a day (QID) | ORAL | Status: DC | PRN

## 2013-10-25 MED ORDER — METFORMIN HCL ER 500 MG PO TB24: 500.0000 mg | ORAL_TABLET | Freq: Every day | ORAL | Status: DC

## 2013-10-25 MED ORDER — RAMIPRIL 10 MG PO CAPS: 10.0000 mg | ORAL_CAPSULE | Freq: Every day | ORAL | Status: DC

## 2013-10-25 NOTE — Patient Instructions (Signed)
Reflux of gastric acid may be asymptomatic as this may occur mainly during sleep.The triggers for reflux  include stress; the "aspirin family" ; alcohol; peppermint; and caffeine (coffee, tea, cola, and chocolate). The aspirin family would include aspirin and the nonsteroidal agents such as ibuprofen &  Naproxen. Tylenol would not cause reflux. If having symptoms ; food & drink should be avoided for @ least 2 hours before going to bed.  If Voltaren gel is too expensive ;use an anti-inflammatory cream such as Aspercreme or Zostrix cream twice a day to the affected area as needed. In lieu of this warm moist compresses or  hot water bottle can be used. Do not apply ice . Consider glucosamine sulfate 1500 mg daily for joint symptoms. This will rehydrate the tendons & cartilages.  Eat a low-fat diet with lots of fruits and vegetables, up to 7-9 servings per day. Consume less than 40  Grams (preferably ZERO) of sugar per day from foods & drinks with High Fructose Corn Syrup (HFCS) sugar as #1,2,3 or # 4 on label.[employer], [employer] do not carry products with HFCS. Follow a  low carb nutrition program such as [place] or The New Sugar [employer]usters  to prevent Diabetes progression . White carbohydrates (potatoes, rice, bread, and pasta) have a high spike of sugar and a high load of sugar. For example a  baked potato has a cup of sugar and a  french fry  2 teaspoons of sugar. Yams, wild  rice, whole grained bread &  wheat pasta have been much lower spike and load of  sugar. Portions should be the size of a deck of cards or your palm.

## 2013-10-25 NOTE — Progress Notes (Signed)
Pre visit review using our clinic review tool, if applicable. No additional management support is needed unless otherwise documented below in the visit note. 

## 2013-10-25 NOTE — Progress Notes (Signed)
Subjective:    Patient ID: Jacob [name] III, male    DOB: 12-09-1965, 48 y.o.   MRN: [id number]  HPI   He presents with problem in his left ear manifested as pressure. He's had chronic ear problems since childhood. He also is requesting med refills.   He describes fatigue and weakness. He also describes diffuse arthralgias in his hands as well as chronic low back pain and right shoulder pain. 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 He also has repeated trauma to his hands.  He describes intermittent dysphagia with meat which he relates to inability to chew well having lost some teeth. He will have severe dyspepsia if he does not take over-the-counter Nexium at least every other day. An endoscopy previously revealed ulcers  He does drink wine occasionally.  Recently he has experienced increase in  Polyarthralgia & has been taking Aleve or Advil 4-6 per day. He also has 2-3 diet [facility] today.  He does exercise walking 1- 1.5 miles 3-4 times per week . He will be also uses treadmill. He denies any cardiac or pulmonary symptoms with that.  He does not smoke.    He is requesting refills of his Narco for chronic low back pain as well as his Xanax which he takes at bedtime.  He also wants samples  of Cialis which he says is too expensive.   He is evasive as he discusses his diabetes. Apparently he rarely checks glucoses. He could not give me a specific fasting blood sugar number. 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 He is anxious to come off the metformin but he admits to a "not healthy diet" with dramatic increased consumption of bread.  His last labs revealed an LDL of 124 and HDL of 38.8. His most recent A1c was 6.7 in April.   Review of Systems  He specifically denies chest pain, palpitations, or dyspnea with exertion  He denies melena or rectal bleeding. He did have dark stool but this was related to  ingesting Pepto-Bismol.       Objective:   Physical Exam   Positive significant physical findings include: Chronic mucoid changes are suggestive over the left tympanic membrane. The right tympanic membrane is scarred. He does have a goatee. He is wearing earrings bilaterally. He has some low back pain with straight leg raising but there is no radiation of the lower extremities. There is ecchymosis of one fingernail. He has abrasions of the hands.  General appearance :adequately nourished; in no distress. Eyes: No conjunctival inflammation or scleral icterus is present. Oral exam: Multiple missing teeth. Lips and gums are healthy appearing.There is no oropharyngeal erythema or exudate noted.  Heart:  Normal rate and regular rhythm. S1 and S2 normal without gallop, murmur, click, rub or other extra sounds   Lungs:Chest clear to auscultation; no wheezes, rhonchi,rales ,or rubs present.No increased work of breathing.  Abdomen: bowel sounds normal, soft and non-tender without masses, organomegaly or hernias noted.  No guarding or rebound. No flank tenderness to percussion. Skin:Warm & dry.  Intact without suspicious lesions or rashes ; no jaundice or tenting Lymphatic: No lymphadenopathy is noted about the head, neck, axilla.  Psych: difficulty staying focused & very anxious but oriented X3           Assessment & Plan:  #1 mucoid changes left tympanic membrane, rule out fungal disease in the context of #2  #2 diabetes;  A1c suggested control  #3 hypertension, adequate control  #4 chronic pain syndrome with polyarthralgias & chronic low back pain. Alternatives to the narcotics and nonsteroidals were discussed and listed in the after visit summary  #5 past history of ulcers: Ongoing ingestion of nonsteroidals  #6 ED;  less expensive options were discussed and prescription provided  Plan: He was given a limited Rx for his Norco and Xanax. I defer to Dr. Ronnald Ramp of the long-term. I  explained I do not practice chronic pain medicine

## 2013-10-30 ENCOUNTER — Encounter: Payer: Self-pay | Admitting: *Deleted

## 2013-10-30 ENCOUNTER — Telehealth: Payer: Self-pay | Admitting: *Deleted

## 2013-10-30 NOTE — Telephone Encounter (Signed)
Generated letter.. notified pt ready for pick-up../lmb 

## 2013-10-30 NOTE — Telephone Encounter (Signed)
Left msg on triage requesting call back. Called pt back no answer LMOM RTC.../lmb 

## 2013-10-30 NOTE — Telephone Encounter (Signed)
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

## 2013-10-30 NOTE — Telephone Encounter (Signed)
It is okay with me for this letter to be drafted

## 2013-11-02 ENCOUNTER — Ambulatory Visit: Payer: Self-pay | Admitting: Internal Medicine

## 2013-12-13 ENCOUNTER — Encounter: Payer: Self-pay | Admitting: Internal Medicine

## 2014-01-16 ENCOUNTER — Encounter: Payer: Self-pay | Admitting: Internal Medicine

## 2014-05-02 ENCOUNTER — Telehealth: Payer: Self-pay | Admitting: Internal Medicine

## 2014-05-02 NOTE — Telephone Encounter (Signed)
Ok with me 

## 2014-05-02 NOTE — Telephone Encounter (Signed)
Pt will tell his brother to call and make an appt later.

## 2014-05-02 NOTE — Telephone Encounter (Signed)
Pt request to transfer from Dr. Ronnald Ramp to Dr. Jenny Reichmann because he cant get off work before 4:30pm and he wants to come for a late appt. Please advise.

## 2014-05-09 ENCOUNTER — Ambulatory Visit (INDEPENDENT_AMBULATORY_CARE_PROVIDER_SITE_OTHER): Payer: No Typology Code available for payment source | Admitting: Internal Medicine

## 2014-05-09 ENCOUNTER — Encounter: Payer: Self-pay | Admitting: Internal Medicine

## 2014-05-09 VITALS — BP 134/88 | HR 79 | Temp 98.4°F | Ht 70.5 in | Wt 194.0 lb

## 2014-05-09 DIAGNOSIS — M545 Low back pain, unspecified: Secondary | ICD-10-CM

## 2014-05-09 DIAGNOSIS — I10 Essential (primary) hypertension: Secondary | ICD-10-CM

## 2014-05-09 DIAGNOSIS — E119 Type 2 diabetes mellitus without complications: Secondary | ICD-10-CM

## 2014-05-09 DIAGNOSIS — M159 Polyosteoarthritis, unspecified: Secondary | ICD-10-CM

## 2014-05-09 DIAGNOSIS — M150 Primary generalized (osteo)arthritis: Secondary | ICD-10-CM

## 2014-05-09 DIAGNOSIS — F418 Other specified anxiety disorders: Secondary | ICD-10-CM

## 2014-05-09 DIAGNOSIS — Z0000 Encounter for general adult medical examination without abnormal findings: Secondary | ICD-10-CM

## 2014-05-09 DIAGNOSIS — Z0189 Encounter for other specified special examinations: Secondary | ICD-10-CM

## 2014-05-09 MED ORDER — RAMIPRIL 10 MG PO CAPS: 10.0000 mg | ORAL_CAPSULE | Freq: Every day | ORAL | Status: DC

## 2014-05-09 MED ORDER — NAPROXEN 500 MG PO TABS
500.0000 mg | ORAL_TABLET | Freq: Two times a day (BID) | ORAL | Status: DC | PRN
Start: 2014-05-09 — End: 2015-09-27

## 2014-05-09 MED ORDER — ASPIRIN EC 81 MG PO TBEC: 81.0000 mg | DELAYED_RELEASE_TABLET | Freq: Every day | ORAL | Status: DC

## 2014-05-09 MED ORDER — ALPRAZOLAM 0.5 MG PO TABS: ORAL_TABLET | ORAL | Status: DC

## 2014-05-09 MED ORDER — SILDENAFIL CITRATE 20 MG PO TABS: ORAL_TABLET | ORAL | Status: DC

## 2014-05-09 MED ORDER — METFORMIN HCL ER 500 MG PO TB24: 500.0000 mg | ORAL_TABLET | Freq: Every day | ORAL | Status: DC

## 2014-05-09 MED ORDER — HYDROCODONE-ACETAMINOPHEN 7.5-325 MG PO TABS: 1.0000 | ORAL_TABLET | Freq: Every day | ORAL | Status: DC | PRN

## 2014-05-09 NOTE — Progress Notes (Signed)
Pre visit review using our clinic review tool, if applicable. No additional management support is needed unless otherwise documented below in the visit note. 

## 2014-05-09 NOTE — Patient Instructions (Signed)
Please take all new medication as prescribed - the naproxen for pain  Please continue all other medications as before, and refills have been done if requested, including a limited prescription for the xanax and norco  Please have the pharmacy call with any other refills you may need.  Please continue your efforts at being more active, low cholesterol diabetic diet, and weight control.  You will be contacted regarding the referral for: Dr Tamala Julian - sports medicine  Please keep your appointments with your specialists as you may have planned  Please go to the LAB in the Basement (turn left off the elevator) for the tests to be done as soon as you can  Please return in 6 months, or sooner if needed, with Lab testing done 3-5 days before

## 2014-05-09 NOTE — Progress Notes (Signed)
Subjective:    Patient ID: Jacob [name], male    DOB: 05-Oct-1965, 49 y.o.   MRN: [id number]  HPI  Here to establish as new pt to me, as Dr Ronnald Ramp not available after hours; mentions mult msk issues with right shoulder, lower back s/p lumbar surgury, right knee pain.  Still works daily, hard to take time off due to needing to pay the bills.   Pt denies chest pain, increased sob or doe, wheezing, orthopnea, PND, increased LE swelling, palpitations, dizziness or syncope.  Pt denies polydipsia, polyuria, or low sugar symptoms such as weakness or confusion improved with po intake.  Pt denies new neurological symptoms such as new headache, or facial or extremity weakness or numbness.   Pt states overall good compliance with meds, has been trying to follow lower cholesterol, diabetic diet, with wt overall stable Wt Readings from Last 3 Encounters:  05/09/14 194 lb (87.998 kg)  10/25/13 191 lb 6.4 oz (86.818 kg)  07/17/13 194 lb 4 oz (88.111 kg)   Denies worsening depressive symptoms, suicidal ideation, or panic; has ongoing anxiety.  C/o difficulty sleeping - cant tolerate ambien in past with next day hangover.  Needs xanax refill.  Pt continues to have recurring LBP without change in severity, bowel or bladder change, fever, wt loss,  worsening LE pain/numbness/weakness, gait change or falls. Past Medical History  Diagnosis Date  . Cholesteatoma of right ear   . Anxiety   . GERD (gastroesophageal reflux disease)   . Allergy   . Diabetes mellitus     bordreline  . Hypertension    Past Surgical History  Procedure Laterality Date  . Lumbar disc surgery    . Inner ear surgery      right  . Cholecystectomy    . Tubes in bil ears    . Hypospadias correction N/A 1978    reports that he has never smoked. He has never used smokeless tobacco. He reports that he drinks about 3.0 oz of alcohol per week. He reports that he does not use illicit drugs. family history includes COPD in his mother;  Diabetes in his maternal grandmother and paternal grandmother; Heart disease in his father; Hypertension in his mother; Lung cancer in his maternal uncle. There is no history of Colon cancer, Esophageal cancer, Rectal cancer, or Stomach cancer. No Known Allergies No current outpatient prescriptions on file prior to visit.   No current facility-administered medications on file prior to visit.    Review of Systems  Constitutional: Negative for unusual diaphoresis or other sweats  HENT: Negative for ringing in ear Eyes: Negative for double vision or worsening visual disturbance.  Respiratory: Negative for choking and stridor.   Gastrointestinal: Negative for vomiting or other signifcant bowel change Genitourinary: Negative for hematuria or decreased urine volume.  Musculoskeletal: Negative for other MSK pain or swelling Skin: Negative for color change and worsening wound.  Neurological: Negative for tremors and numbness other than noted  Psychiatric/Behavioral: Negative for decreased concentration or agitation other than above       Objective:   Physical Exam BP 134/88 mmHg  Pulse 79  Temp(Src) 98.4 F (36.9 C) (Oral)  Ht 5' 10.5" (1.791 m)  Wt 194 lb (87.998 kg)  BMI 27.43 kg/m2 VS noted,  Constitutional: Pt appears well-developed, well-nourished.  HENT: Head: NCAT.  Right Ear: External ear normal.  Left Ear: External ear normal.  Eyes: . Pupils are equal, round, and reactive to light. Conjunctivae and EOM are normal  Neck: Normal range of motion. Neck supple.  Cardiovascular: Normal rate and regular rhythm.   Pulmonary/Chest: Effort normal and breath sounds without rales or wheezing.  Abd:  Soft, NT, ND, + BS Neurological: Pt is alert. Not confused , motor grossly intact Skin: Skin is warm. No rash Psychiatric: Pt behavior is normal. No agitation.     Assessment & Plan:

## 2014-05-12 NOTE — Assessment & Plan Note (Signed)
stable overall by history and exam, recent data reviewed with pt, and pt to continue medical treatment as before,  to f/u any worsening symptoms or concerns Lab Results  Component Value Date   WBC 4.2* 01/11/2013   HGB 16.3 01/11/2013   HCT 46.9 01/11/2013   PLT 213.0 01/11/2013   GLUCOSE 175* 07/17/2013   CHOL 186 01/11/2013   TRIG 115.0 01/11/2013   HDL 38.80* 01/11/2013   LDLCALC 124* 01/11/2013   ALT 22 01/11/2013   AST 22 01/11/2013   NA 143 07/17/2013   K 4.7 07/17/2013   CL 106 07/17/2013   CREATININE 1.2 07/17/2013   BUN 12 07/17/2013   CO2 27 07/17/2013   TSH 2.13 01/11/2013   PSA 0.92 01/11/2013   INR 1.0 01/18/2007   HGBA1C 6.7* 07/17/2013   MICROALBUR 0.6 01/11/2013

## 2014-05-12 NOTE — Assessment & Plan Note (Signed)
stable overall by history and exam, recent data reviewed with pt, and pt to continue medical treatment as before,  to f/u any worsening symptoms or concerns, may need to consider pain clinic if needs daily med for pain control

## 2014-05-12 NOTE — Assessment & Plan Note (Signed)
stable overall by history and exam, recent data reviewed with pt, and pt to continue medical treatment as before,  to f/u any worsening symptoms or concerns BP Readings from Last 3 Encounters:  05/09/14 134/88  10/25/13 128/86  08/07/13 124/77

## 2014-05-12 NOTE — Assessment & Plan Note (Signed)
stable overall by history and exam, recent data reviewed with pt, and pt to continue medical treatment as before,  to f/u any worsening symptoms or concerns Lab Results  Component Value Date   HGBA1C 6.7* 07/17/2013

## 2014-05-12 NOTE — Assessment & Plan Note (Signed)
stable overall by history and exam, recent data reviewed with pt, and pt to continue medical treatment as before,  to f/u any worsening symptoms or concerns  

## 2014-06-28 ENCOUNTER — Encounter: Payer: Self-pay | Admitting: Family Medicine

## 2014-06-28 ENCOUNTER — Ambulatory Visit (INDEPENDENT_AMBULATORY_CARE_PROVIDER_SITE_OTHER): Payer: No Typology Code available for payment source | Admitting: Family Medicine

## 2014-06-28 VITALS — BP 112/78 | HR 67 | Ht 70.5 in | Wt 195.0 lb

## 2014-06-28 DIAGNOSIS — M7541 Impingement syndrome of right shoulder: Secondary | ICD-10-CM | POA: Diagnosis not present

## 2014-06-28 DIAGNOSIS — M24559 Contracture, unspecified hip: Secondary | ICD-10-CM | POA: Insufficient documentation

## 2014-06-28 DIAGNOSIS — M25561 Pain in right knee: Secondary | ICD-10-CM | POA: Diagnosis not present

## 2014-06-28 DIAGNOSIS — M24551 Contracture, right hip: Secondary | ICD-10-CM

## 2014-06-28 MED ORDER — DICLOFENAC SODIUM 2 % TD SOLN: TRANSDERMAL | Status: DC

## 2014-06-28 NOTE — Progress Notes (Signed)
Pre visit review using our clinic review tool, if applicable. No additional management support is needed unless otherwise documented below in the visit note. 

## 2014-06-28 NOTE — Patient Instructions (Signed)
Good to see you Ice 20 minutes 2 times daily. Usually after activity and before bed. Exercises for shoulder and knee and hip 2 times a week as well.  Vitamin D 2000 IU daily Fish oil 2 grams daily Turmeric 500mg  twice daily Pennsaid twice daily topically.  Always wear good shoes as well.  See me again in 4-6 weeks.

## 2014-06-28 NOTE — Progress Notes (Signed)
[facility] [address] [place], [place] [address] Phone: [phone] Subjective:    I'm seeing this patient by the request  of:  Cathlean Cower, MD   CC: Multiple joint pain  SAY:TKZSWFUXNA MOISES TERPSTRA III is a 49 y.o. male coming in with complaint of multiple joints. Patient states that they're all on right side. 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 Patient has had problems with chronic pain previously. 1. Right shoulder. Patient states that he has had shoulder pain for approximately 1 decade. Patient states that he is done multiple things such as formal physical therapy and states only chronic narcotics seem to be helpful. Patient states that he has difficulty actually extending his arm as well as external rotation. Patient states that things as throwing a ball can be very painful. Patient denies any weakness. Patient has had imaging for this previously and in 2008 there was an MRI. This was reviewed by me and showed that patient did have a partial insertional tear of the supraspinatus at that time as well as a posterior superior labral tear with a pair a labral cyst. Patient did not have surgery he states. Patient states that this pain is not usually hurt him any other times but just when he is trying to be active with his son. Sometimes can give him some discomfort at night.  2. Right knee pain. Patient has been complaining of right knee pain since he fell back in 2013. Patient states since that time he has had pain especially when he is squatting for long time. Patient denies any clicking, popping or giving out on him. Patient has not tried any home modalities except for heat that sometimes is helpful. Patient rates the severity of pain as 5 out of 10. No numbness or radiation of the pain. No significant weakness of the lower extremity. Does not stop him from his regular daily activities. Reviewing patient's  chart he did have x-rays in 2013 of the right knee that was unremarkable for any bony abnormality.  3. Right hip pain-patient states that he has a pain when he is in a squatted position for quite some time. States that he has to stretch for multiple minutes before it loosens up. Seems to get better with activity. Patient states sometimes waking up in the morning can give him discomfort as well. Patient denies any radiation of pain or any bowel or bladder incontinence. Patient is a diabetic and states sometimes when his sugars are high it seems to be worse.     Past medical history, social, surgical and family history all reviewed in electronic medical record.   Past Medical History  Diagnosis Date  . Cholesteatoma of right ear   . Anxiety   . GERD (gastroesophageal reflux disease)   . Allergy   . Diabetes mellitus     bordreline  . Hypertension    Past Surgical History  Procedure Laterality Date  . Lumbar disc surgery    . Inner ear surgery      right  . Cholecystectomy    . Tubes in bil ears    . Hypospadias correction N/A 1978   History  Substance Use Topics  . Smoking status: Never Smoker   . Smokeless tobacco: Never Used  . Alcohol Use: 3.0 oz/week    5 Glasses of wine per week     Comment: social   Family History  Problem Relation Age of Onset  . COPD Mother   . Hypertension Mother   . Heart disease Father   . Lung cancer Maternal Uncle   . Diabetes Paternal Grandmother   . Diabetes Maternal Grandmother   . Colon cancer Neg Hx   . Esophageal cancer Neg Hx   . Rectal cancer Neg Hx   . Stomach cancer Neg Hx    No Known Allergies   Review of Systems: No headache, visual changes, nausea, vomiting, diarrhea, constipation, dizziness, abdominal pain, skin rash, fevers, chills, night sweats, weight loss, swollen lymph nodes, body aches, joint swelling, muscle aches, chest pain, shortness of breath, mood changes.   Objective Blood pressure 112/78, pulse 67, height 5'  10.5" (1.791 m), weight 195 lb (88.451 kg), SpO2 96 %.  General: No apparent distress alert and oriented x3 mood and affect normal, dressed appropriately.  HEENT: Pupils equal, extraocular movements intact  Respiratory: Patient's speak in full sentences and does not appear short of breath  Cardiovascular: No lower extremity edema, non tender, no erythema  Skin: Warm dry intact with no signs of infection or rash on extremities or on axial skeleton.  Abdomen: Soft nontender  Neuro: Cranial nerves II through XII are intact, neurovascularly intact in all extremities with 2+ DTRs and 2+ pulses.  Lymph: No lymphadenopathy of posterior or anterior cervical chain or axillae bilaterally.  Gait normal with good balance and coordination.  MSK:  Non tender with full range of motion and good stability and symmetric strength and tone of  elbows, wrist, and ankles bilaterally.  Shoulder: Right Inspection reveals no abnormalities, atrophy or asymmetry. Palpation is normal with no tenderness over AC joint or bicipital groove. ROM is full in all planes. Patient does have maybe some mild limitation in external rotation. Positive impingement Rotator cuff strength intact Speeds and Yergason's tests normal. Mild positive labral signs with positive O'Brien's Normal scapular function observed. No painful arc and no drop arm sign. No apprehension sign Contralateral shoulder unremarkable  Knee: Right Normal to inspection with no erythema or effusion or obvious bony abnormalities. Mild tenderness to palpation over the medial and lateral joint line ROM full in flexion and extension and lower leg rotation. Ligaments with solid consistent endpoints including ACL, PCL, LCL, MCL. Negative Mcmurray's, Apley's, and Thessalonian tests. Non painful patellar compression. Patellar glide without crepitus. Mild lateral tracking Patellar and quadriceps tendons unremarkable. Hamstring and quadriceps strength is normal.    Hip: Right ROM IR: 35 Deg, ER: 45 Deg, Flexion: 120 Deg, Extension: 100 Deg, Abduction: 45 Deg, Adduction: 45 Deg Strength IR: 5/5, ER: 5/5, Flexion: 5/5, Extension: 5/5, Abduction: 5/5, Adduction: 5/5 Pelvic alignment unremarkable to inspection and palpation. Standing hip rotation and gait without trendelenburg sign / unsteadiness. Greater trochanter without tenderness to palpation. No tenderness over piriformis and greater trochanter. No pain with FABER or FADIR. Tightness of hip flexors bilaterally No SI joint tenderness and normal minimal SI movement.     Impression and Recommendations:     This case required medical decision making of moderate complexity.

## 2014-06-28 NOTE — Assessment & Plan Note (Signed)
Patient does have some shoulder impingement there is a question of patient having a worsening. Labral cyst. Patient in 2008 did have this on MRI and we may need to consider further imaging a patient continues to have difficulty. Patient has had some financial constraints and would not be able to take any time off of work so he is unable to do formal physical therapy. Patient given home exercises, topical anti-inflammatories and we discussed over-the-counter natural supplementation. Patient and will come back and see me again in 4-6 weeks. At that time we can consider an injection of patient would like.

## 2014-06-28 NOTE — Assessment & Plan Note (Signed)
I think the right knee pain is likely also secondary to his work. Questionable differential includes patellofemoral syndrome, or potential degenerative meniscal tear but patient is not having any significant internal derangement type signs. Patient given a brace and topical anti-inflammatories. Patient given home exercises to try as well. Patient once again did bring up the idea of narcotics which we declined to fill. Patient will make these conservative changes and come back in 3-4 weeks. We can always consider injection, repeat imaging, or formal physical therapy. Patient did not want an injection today due to financial constraints and he states that his insurance does not pay for it.

## 2014-06-28 NOTE — Assessment & Plan Note (Signed)
Patient's job discussed patient to be more in a flexed position. Discussed stretches and patient was given a handout. Patient wanted narcotics declined to fill this. Patient has been sent to my pain management center previously. Patient will come back again in 4-6 weeks.

## 2014-09-24 ENCOUNTER — Other Ambulatory Visit: Payer: Self-pay

## 2014-10-07 ENCOUNTER — Other Ambulatory Visit: Payer: Self-pay

## 2014-10-07 ENCOUNTER — Emergency Department (HOSPITAL_COMMUNITY): Payer: BLUE CROSS/BLUE SHIELD

## 2014-10-07 ENCOUNTER — Emergency Department (HOSPITAL_COMMUNITY)
Admission: EM | Admit: 2014-10-07 | Discharge: 2014-10-07 | Disposition: A | Discharge status: Home / Self Care | Payer: BLUE CROSS/BLUE SHIELD | Attending: Emergency Medicine | Admitting: Emergency Medicine

## 2014-10-07 ENCOUNTER — Ambulatory Visit (INDEPENDENT_AMBULATORY_CARE_PROVIDER_SITE_OTHER): Payer: BLUE CROSS/BLUE SHIELD | Admitting: Family Medicine

## 2014-10-07 VITALS — BP 130/88 | HR 108 | Temp 97.6°F | Resp 18 | Ht 71.0 in | Wt 188.0 lb

## 2014-10-07 DIAGNOSIS — R002 Palpitations: Secondary | ICD-10-CM | POA: Diagnosis not present

## 2014-10-07 DIAGNOSIS — R079 Chest pain, unspecified: Secondary | ICD-10-CM | POA: Diagnosis not present

## 2014-10-07 DIAGNOSIS — R55 Syncope and collapse: Secondary | ICD-10-CM | POA: Insufficient documentation

## 2014-10-07 DIAGNOSIS — R61 Generalized hyperhidrosis: Secondary | ICD-10-CM | POA: Insufficient documentation

## 2014-10-07 DIAGNOSIS — E1165 Type 2 diabetes mellitus with hyperglycemia: Secondary | ICD-10-CM | POA: Diagnosis not present

## 2014-10-07 DIAGNOSIS — I10 Essential (primary) hypertension: Secondary | ICD-10-CM | POA: Diagnosis not present

## 2014-10-07 DIAGNOSIS — R42 Dizziness and giddiness: Secondary | ICD-10-CM | POA: Insufficient documentation

## 2014-10-07 DIAGNOSIS — Z8659 Personal history of other mental and behavioral disorders: Secondary | ICD-10-CM | POA: Insufficient documentation

## 2014-10-07 DIAGNOSIS — Z7982 Long term (current) use of aspirin: Secondary | ICD-10-CM | POA: Diagnosis not present

## 2014-10-07 DIAGNOSIS — Z79899 Other long term (current) drug therapy: Secondary | ICD-10-CM | POA: Diagnosis not present

## 2014-10-07 DIAGNOSIS — IMO0002 Reserved for concepts with insufficient information to code with codable children: Secondary | ICD-10-CM

## 2014-10-07 DIAGNOSIS — R51 Headache: Secondary | ICD-10-CM | POA: Diagnosis not present

## 2014-10-07 DIAGNOSIS — K219 Gastro-esophageal reflux disease without esophagitis: Secondary | ICD-10-CM | POA: Insufficient documentation

## 2014-10-07 DIAGNOSIS — Z8669 Personal history of other diseases of the nervous system and sense organs: Secondary | ICD-10-CM | POA: Insufficient documentation

## 2014-10-07 LAB — BASIC METABOLIC PANEL
Anion gap: 7 (ref 5–15)
BUN: 8 mg/dL (ref 6–20)
CHLORIDE: 106 mmol/L (ref 101–111)
CO2: 24 mmol/L (ref 22–32)
Calcium: 8.7 mg/dL — ABNORMAL LOW (ref 8.9–10.3)
Creatinine, Ser: 1.18 mg/dL (ref 0.61–1.24)
GFR calc non Af Amer: >60 mL/min (ref >60)
Glucose, Bld: 146 mg/dL — ABNORMAL HIGH (ref 65–99)
Potassium: 4.2 mmol/L (ref 3.5–5.1)
Sodium: 137 mmol/L (ref 135–145)

## 2014-10-07 LAB — CBC
HCT: 43.9 % (ref 39.0–52.0)
Hemoglobin: 15.4 g/dL (ref 13.0–17.0)
MCH: 29.8 pg (ref 26.0–34.0)
MCHC: 35.1 g/dL (ref 30.0–36.0)
MCV: 85.1 fL (ref 78.0–100.0)
Platelets: 161 10*3/uL (ref 150–400)
RBC: 5.16 MIL/uL (ref 4.22–5.81)
RDW: 12.5 % (ref 11.5–15.5)
WBC: 7.1 10*3/uL (ref 4.0–10.5)

## 2014-10-07 LAB — COMPLETE METABOLIC PANEL WITH GFR
ALT: 28 U/L (ref 0–53)
AST: 19 U/L (ref 0–37)
Albumin: 3.9 g/dL (ref 3.5–5.2)
Alkaline Phosphatase: 76 U/L (ref 39–117)
BUN: 12 mg/dL (ref 6–23)
CO2: 25 mEq/L (ref 19–32)
Calcium: 8.7 mg/dL (ref 8.4–10.5)
Chloride: 102 mEq/L (ref 96–112)
Creat: 1.37 mg/dL — ABNORMAL HIGH (ref 0.50–1.35)
GFR, Est African American: 70 mL/min
GFR, Est Non African American: 61 mL/min
Glucose, Bld: 320 mg/dL — ABNORMAL HIGH (ref 70–99)
Potassium: 5 mEq/L (ref 3.5–5.3)
Sodium: 137 mEq/L (ref 135–145)
Total Bilirubin: 0.6 mg/dL (ref 0.2–1.2)
Total Protein: 6.5 g/dL (ref 6.0–8.3)

## 2014-10-07 LAB — GLUCOSE, POCT (MANUAL RESULT ENTRY): POC Glucose: 343 mg/dl — AB (ref 70–99)

## 2014-10-07 LAB — I-STAT TROPONIN, ED
TROPONIN I, POC: 0 ng/mL (ref 0.00–0.08)
Troponin i, poc: 0 ng/mL (ref 0.00–0.08)

## 2014-10-07 LAB — POCT GLYCOSYLATED HEMOGLOBIN (HGB A1C): Hemoglobin A1C: 7.4

## 2014-10-07 LAB — D-DIMER, QUANTITATIVE (NOT AT ARMC): D-Dimer, Quant: <0.27 ug/mL-FEU (ref 0.00–0.48)

## 2014-10-07 MED ORDER — SODIUM CHLORIDE 0.9 % IV BOLUS (SEPSIS)
1000.0000 mL | Freq: Once | INTRAVENOUS | Status: AC
  Administered 2014-10-07: 1000 mL via INTRAVENOUS

## 2014-10-07 MED ORDER — KETOROLAC TROMETHAMINE 30 MG/ML IJ SOLN
30.0000 mg | Freq: Once | INTRAMUSCULAR | Status: AC
  Administered 2014-10-07: 30 mg via INTRAVENOUS
  Filled 2014-10-07: qty 1

## 2014-10-07 NOTE — ED Notes (Signed)
Kendrick Fries [phone] (daughter)

## 2014-10-07 NOTE — ED Notes (Signed)
Pt d/c'd from IV, monitor, continuous pulse oximetry and blood pressure cuff; pt given scrub top to wear; visitors at bedside

## 2014-10-07 NOTE — ED Notes (Signed)
Pt placed back on monitor, continuous pulse oximetry and blood pressure cuff; visitors at bedside

## 2014-10-07 NOTE — ED Notes (Signed)
Pt undressed, in gown, on monitor, continuous pulse oximetry and blood pressure cuff 

## 2014-10-07 NOTE — ED Notes (Signed)
Pt has been moved to A7 from starting in the hallway

## 2014-10-07 NOTE — ED Provider Notes (Signed)
CSN: [id number]     Arrival date & time 10/07/14  1251 History   First MD Initiated Contact with Patient 10/07/14 1252     Chief Complaint  Patient presents with  . Loss of Consciousness     (Consider location/radiation/quality/duration/timing/severity/associated sxs/prior Treatment) HPI Comments: 49 year old male presenting after a single episode occurring earlier today while outside cutting down trees.  He was seen at urgent care and sent to the emergency department for further evaluation as they were concerned about his EKG.  On chart review, it is noted that he has a normal EKG.  Patient states prior to the syncopal episode, he was experiencing palpitations and a sensation that his heart rate was very rapid with associated sharp, midsternal chest pain.  He is not sure how long he lost consciousness for, states he woke up with a neighbor standing over him and then was taken to the urgent care.  He did not want to call EMS.  Currently states he is feeling a little lightheaded and dizzy with a headache and feels dehydrated.  States it is very hot outside, when he was working.  He was given 324 mg aspirin via EMS.  Denies ever having symptoms like this in the past.  Denies extremity numbness or weakness, slurred speech, confusion, shortness of breath, nausea or vomiting.  States he was diaphoretic prior to having the syncopal episode.  Denies history of heart attack, stroke, or blood clots.  Her recent long travel or surgery.  Patient is a 49 y.o. male presenting with syncope. The history is provided by the patient, the EMS personnel and medical records.  Loss of Consciousness Associated symptoms: chest pain, diaphoresis, headaches and palpitations     Past Medical History  Diagnosis Date  . Cholesteatoma of right ear   . Anxiety   . GERD (gastroesophageal reflux disease)   . Allergy   . Diabetes mellitus     bordreline  . Hypertension    Past Surgical History  Procedure Laterality Date   . Lumbar disc surgery    . Inner ear surgery      right  . Cholecystectomy    . Tubes in bil ears    . Hypospadias correction N/A 1978   Family History  Problem Relation Age of Onset  . COPD Mother   . Hypertension Mother   . Heart disease Father   . Lung cancer Maternal Uncle   . Diabetes Paternal Grandmother   . Diabetes Maternal Grandmother   . Colon cancer Neg Hx   . Esophageal cancer Neg Hx   . Rectal cancer Neg Hx   . Stomach cancer Neg Hx    History  Substance Use Topics  . Smoking status: Never Smoker   . Smokeless tobacco: Never Used  . Alcohol Use: 3.0 oz/week    5 Glasses of wine per week     Comment: social    Review of Systems  Constitutional: Positive for diaphoresis.  Cardiovascular: Positive for chest pain, palpitations and syncope.  Neurological: Positive for syncope, light-headedness and headaches.  All other systems reviewed and are negative.     Allergies  Review of patient's allergies indicates no known allergies.  Home Medications   Prior to Admission medications   Medication Sig Start Date End Date Taking? Authorizing Provider  ALPRAZolam Duanne Moron) 0.5 MG tablet For sleep. 1 -2 tabs by mouth as needed at  bedtime 05/09/14  Yes Biagio Borg, MD  ALPRAZolam Duanne Moron) 1 MG tablet Take 1 mg  by mouth at bedtime. 09/17/14  Yes Historical Provider, MD  aspirin EC 81 MG tablet Take 1 tablet (81 mg total) by mouth daily. 05/09/14  Yes Biagio Borg, MD  metFORMIN (GLUCOPHAGE-XR) 500 MG 24 hr tablet Take 1 tablet (500 mg total) by mouth daily with breakfast. 05/09/14  Yes Biagio Borg, MD  naproxen (NAPROSYN) 500 MG tablet Take 1 tablet (500 mg total) by mouth 2 (two) times daily as needed. 05/09/14  Yes Biagio Borg, MD  ramipril (ALTACE) 5 MG capsule Take 5 mg by mouth daily. 09/17/14  Yes Historical Provider, MD  triamcinolone cream (KENALOG) 0.1 % Apply 1 application topically 2 (two) times daily.   Yes Historical Provider, MD  Diclofenac Sodium 2 % SOLN  Apply 1 pump twice daily. Patient not taking: Reported on 10/07/2014 06/28/14   Lyndal Pulley, DO  HYDROcodone-acetaminophen [facility]) 7.5-325 MG per tablet Take 1 tablet by mouth daily as needed. Patient not taking: Reported on 10/07/2014 05/09/14   Biagio Borg, MD  ramipril (ALTACE) 10 MG capsule Take 1 capsule (10 mg total) by mouth daily. Patient not taking: Reported on 10/07/2014 05/09/14   Biagio Borg, MD  sildenafil (REVATIO) 20 MG tablet 3 qd prn Patient not taking: Reported on 10/07/2014 05/09/14   Biagio Borg, MD   BP 127/80 mmHg  Pulse 70  Temp(Src) 98.8 F (37.1 C) (Oral)  Resp 14  SpO2 99% Physical Exam  Constitutional: He is oriented to person, place, and time. He appears well-developed and well-nourished. No distress.  HENT:  Head: Normocephalic and atraumatic.  Mouth/Throat: Oropharynx is clear and moist.  Eyes: Conjunctivae and EOM are normal. Pupils are equal, round, and reactive to light.  Neck: Normal range of motion. Neck supple. No JVD present.  Cardiovascular: Normal rate, regular rhythm, normal heart sounds and intact distal pulses.   No extremity edema.  Pulmonary/Chest: Effort normal and breath sounds normal. No respiratory distress.  Abdominal: Soft. Bowel sounds are normal. There is no tenderness.  Musculoskeletal: Normal range of motion. He exhibits no edema.  Neurological: He is alert and oriented to person, place, and time. He has normal strength. No sensory deficit. Coordination normal. GCS eye subscore is 4. GCS verbal subscore is 5. GCS motor subscore is 6.  Speech fluent, goal oriented. Moves extremities without ataxia. Equal grip strength bilateral.  Skin: Skin is warm and dry. He is not diaphoretic.  Psychiatric: He has a normal mood and affect. His behavior is normal.  Nursing note and vitals reviewed.   ED Course  Procedures (including critical care time) Labs Review Labs Reviewed  BASIC METABOLIC PANEL - Abnormal; Notable for the following:     Glucose, Bld 146 (*)    Calcium 8.7 (*)    All other components within normal limits  CBC  D-DIMER, QUANTITATIVE (NOT AT [facility])  Randolm Idol, ED    Imaging Review Dg Chest 2 View  10/07/2014   CLINICAL DATA:  Syncope  EXAM: CHEST  2 VIEW  COMPARISON:  08/08/2012  FINDINGS: Heart size and vascularity normal. Lungs are clear without infiltrate effusion or mass. Mild apical scarring bilaterally.  IMPRESSION: No active cardiopulmonary disease.   Electronically Signed   By: Franchot Gallo M.D.   On: 10/07/2014 14:31     EKG Interpretation None      MDM   Final diagnoses:  Syncope, unspecified syncope type  Palpitations   Non-toxic appearing, NAD. Afebrile. Mildly tachycardic on arrival. Vitals otherwise stable. Syncopal  episode while working outside in the heat. Sent from [facility]. No abnormality noted on EKG from [facility], EMS or in the ED. D-dimer obtained given palpitations, syncope and tachycardia; WNL. Workup negative. Doubt cardiac. HEART score 2. Plan to check delta-trop. If negative, will d/c home with PCP f/u. Pt signed out to Delos Haring, PA-C at shift change with delta trop to be checked at 1630.  Discussed with attending Dr. Colin Rhein who agrees with plan of care.  Carman Ching, PA-C 10/07/14 1559  Debby Freiberg, MD 10/08/14 1005

## 2014-10-07 NOTE — ED Notes (Signed)
Pt presents with syncopal episode from UC at [facility]. Pt was cutting wood non witnessed. Pt reports tachycardia "sharp chest pain" and hot prior to episode. EKG showed abnormality at [facility]. EMS EKG unremarkable. Pt was given 324 ASA.

## 2014-10-07 NOTE — Discharge Instructions (Signed)
Syncope °Syncope is a medical term for fainting or passing out. This means you lose consciousness and drop to the ground. People are generally unconscious for less than 5 minutes. You may have some muscle twitches for up to 15 seconds before waking up and returning to normal. Syncope occurs more often in older adults, but it can happen to anyone. While most causes of syncope are not dangerous, syncope can be a sign of a serious medical problem. It is important to seek medical care.  °CAUSES  °Syncope is caused by a sudden drop in blood flow to the brain. The specific cause is often not determined. Factors that can bring on syncope include: °· Taking medicines that lower blood pressure. °· Sudden changes in posture, such as standing up quickly. °· Taking more medicine than prescribed. °· Standing in one place for too long. °· Seizure disorders. °· Dehydration and excessive exposure to heat. °· Low blood sugar (hypoglycemia). °· Straining to have a bowel movement. °· Heart disease, irregular heartbeat, or other circulatory problems. °· Fear, emotional distress, seeing blood, or severe pain. °SYMPTOMS  °Right before fainting, you may: °· Feel dizzy or light-headed. °· Feel nauseous. °· See all white or all black in your field of vision. °· Have cold, clammy skin. °DIAGNOSIS  °Your health care provider will ask about your symptoms, perform a physical exam, and perform an electrocardiogram (ECG) to record the electrical activity of your heart. Your health care provider may also perform other heart or blood tests to determine the cause of your syncope which may include: °· Transthoracic echocardiogram (TTE). During echocardiography, sound waves are used to evaluate how blood flows through your heart. °· Transesophageal echocardiogram (TEE). °· Cardiac monitoring. This allows your health care provider to monitor your heart rate and rhythm in real time. °· Holter monitor. This is a portable device that records your  heartbeat and can help diagnose heart arrhythmias. It allows your health care provider to track your heart activity for several days, if needed. °· Stress tests by exercise or by giving medicine that makes the heart beat faster. °TREATMENT  °In most cases, no treatment is needed. Depending on the cause of your syncope, your health care provider may recommend changing or stopping some of your medicines. °HOME CARE INSTRUCTIONS °· Have someone stay with you until you feel stable. °· Do not drive, use machinery, or play sports until your health care provider says it is okay. °· Keep all follow-up appointments as directed by your health care provider. °· Lie down right away if you start feeling like you might faint. Breathe deeply and steadily. Wait until all the symptoms have passed. °· Drink enough fluids to keep your urine clear or pale yellow. °· If you are taking blood pressure or heart medicine, get up slowly and take several minutes to sit and then stand. This can reduce dizziness. °SEEK IMMEDIATE MEDICAL CARE IF:  °· You have a severe headache. °· You have unusual pain in the chest, abdomen, or back. °· You are bleeding from your mouth or rectum, or you have black or tarry stool. °· You have an irregular or very fast heartbeat. °· You have pain with breathing. °· You have repeated fainting or seizure-like jerking during an episode. °· You faint when sitting or lying down. °· You have confusion. °· You have trouble walking. °· You have severe weakness. °· You have vision problems. °If you fainted, call your local emergency services (911 in [place] Do not drive   yourself to the hospital.  MAKE SURE YOU:  Understand these instructions.  Will watch your condition.  Will get help right away if you are not doing well or get worse. Document Released: 03/16/2005 Document Revised: 03/21/2013 Document Reviewed: 05/15/2011 [facility] Patient Information 2015 [place], [place]. This information is not intended to replace  advice given to you by your health care provider. Make sure you discuss any questions you have with your health care provider.  Palpitations A palpitation is the feeling that your heartbeat is irregular or is faster than normal. It may feel like your heart is fluttering or skipping a beat. Palpitations are usually not a serious problem. However, in some cases, you may need further medical evaluation. CAUSES  Palpitations can be caused by:  Smoking.  Caffeine or other stimulants, such as diet pills or energy drinks.  Alcohol.  Stress and anxiety.  Strenuous physical activity.  Fatigue.  Certain medicines.  Heart disease, especially if you have a history of irregular heart rhythms (arrhythmias), such as atrial fibrillation, atrial flutter, or supraventricular tachycardia.  An improperly working pacemaker or defibrillator. DIAGNOSIS  To find the cause of your palpitations, your health care provider will take your medical history and perform a physical exam. Your health care provider may also have you take a test called an ambulatory electrocardiogram (ECG). An ECG records your heartbeat patterns over a 24-hour period. You may also have other tests, such as:  Transthoracic echocardiogram (TTE). During echocardiography, sound waves are used to evaluate how blood flows through your heart.  Transesophageal echocardiogram (TEE).  Cardiac monitoring. This allows your health care provider to monitor your heart rate and rhythm in real time.  Holter monitor. This is a portable device that records your heartbeat and can help diagnose heart arrhythmias. It allows your health care provider to track your heart activity for several days, if needed.  Stress tests by exercise or by giving medicine that makes the heart beat faster. TREATMENT  Treatment of palpitations depends on the cause of your symptoms and can vary greatly. Most cases of palpitations do not require any treatment other than time,  relaxation, and monitoring your symptoms. Other causes, such as atrial fibrillation, atrial flutter, or supraventricular tachycardia, usually require further treatment. HOME CARE INSTRUCTIONS   Avoid:  Caffeinated coffee, tea, soft drinks, diet pills, and energy drinks.  Chocolate.  Alcohol.  Stop smoking if you smoke.  Reduce your stress and anxiety. Things that can help you relax include:  A method of controlling things in your body, such as your heartbeats, with your mind (biofeedback).  Yoga.  Meditation.  Physical activity such as swimming, jogging, or walking.  Get plenty of rest and sleep. SEEK MEDICAL CARE IF:   You continue to have a fast or irregular heartbeat beyond 24 hours.  Your palpitations occur more often. SEEK IMMEDIATE MEDICAL CARE IF:  You have chest pain or shortness of breath.  You have a severe headache.  You feel dizzy or you faint. MAKE SURE YOU:  Understand these instructions.  Will watch your condition.  Will get help right away if you are not doing well or get worse. Document Released: 03/13/2000 Document Revised: 03/21/2013 Document Reviewed: 05/15/2011 [facility] Patient Information 2015 [place], [place]. This information is not intended to replace advice given to you by your health care provider. Make sure you discuss any questions you have with your health care provider. Dehydration, Adult Dehydration is when you lose more fluids from the body than you take in.  Vital organs like the kidneys, brain, and heart cannot function without a proper amount of fluids and salt. Any loss of fluids from the body can cause dehydration.  CAUSES   Vomiting.  Diarrhea.  Excessive sweating.  Excessive urine output.  Fever. SYMPTOMS  Mild dehydration  Thirst.  Dry lips.  Slightly dry mouth. Moderate dehydration  Very dry mouth.  Sunken eyes.  Skin does not bounce back quickly when lightly pinched and released.  Dark urine and  decreased urine production.  Decreased tear production.  Headache. Severe dehydration  Very dry mouth.  Extreme thirst.  Rapid, weak pulse (more than 100 beats per minute at rest).  Cold hands and feet.  Not able to sweat in spite of heat and temperature.  Rapid breathing.  Blue lips.  Confusion and lethargy.  Difficulty being awakened.  Minimal urine production.  No tears. DIAGNOSIS  Your caregiver will diagnose dehydration based on your symptoms and your exam. Blood and urine tests will help confirm the diagnosis. The diagnostic evaluation should also identify the cause of dehydration. TREATMENT  Treatment of mild or moderate dehydration can often be done at home by increasing the amount of fluids that you drink. It is best to drink small amounts of fluid more often. Drinking too much at one time can make vomiting worse. Refer to the home care instructions below. Severe dehydration needs to be treated at the hospital where you will probably be given intravenous (IV) fluids that contain water and electrolytes. HOME CARE INSTRUCTIONS   Ask your caregiver about specific rehydration instructions.  Drink enough fluids to keep your urine clear or pale yellow.  Drink small amounts frequently if you have nausea and vomiting.  Eat as you normally do.  Avoid:  Foods or drinks high in sugar.  Carbonated drinks.  Juice.  Extremely hot or cold fluids.  Drinks with caffeine.  Fatty, greasy foods.  Alcohol.  Tobacco.  Overeating.  Gelatin desserts.  Wash your hands well to avoid spreading bacteria and viruses.  Only take over-the-counter or prescription medicines for pain, discomfort, or fever as directed by your caregiver.  Ask your caregiver if you should continue all prescribed and over-the-counter medicines.  Keep all follow-up appointments with your caregiver. SEEK MEDICAL CARE IF:  You have abdominal pain and it increases or stays in one area  (localizes).  You have a rash, stiff neck, or severe headache.  You are irritable, sleepy, or difficult to awaken.  You are weak, dizzy, or extremely thirsty. SEEK IMMEDIATE MEDICAL CARE IF:   You are unable to keep fluids down or you get worse despite treatment.  You have frequent episodes of vomiting or diarrhea.  You have blood or green matter (bile) in your vomit.  You have blood in your stool or your stool looks black and tarry.  You have not urinated in 6 to 8 hours, or you have only urinated a small amount of very dark urine.  You have a fever.  You faint. MAKE SURE YOU:   Understand these instructions.  Will watch your condition.  Will get help right away if you are not doing well or get worse. Document Released: 03/16/2005 Document Revised: 06/08/2011 Document Reviewed: 11/03/2010 [facility] Patient Information 2015 [place], [place]. This information is not intended to replace advice given to you by your health care provider. Make sure you discuss any questions you have with your health care provider.

## 2014-10-07 NOTE — Progress Notes (Addendum)
° °  Subjective:    Patient ID: Jacob [name], male    DOB: [birth year], 49 y.o.   MRN: [id number] This chart was scribed for Robyn Haber, MD by Zola Button, Medical Scribe. This patient was seen in Room 6 and the patient's care was started at 11:48 AM.   HPI HPI Comments: Jacob [name] is a 49 y.o. male with a history of DM who presents to the [facility] complaining of a syncopal episode earlier today as he was working outside, chopping a tree. Upon regaining consciousness, patient reports having palpitations, headache, sharp chest pain, dizziness, difficulty with memory, and mouth numbness. Since then, his mental state has returned to normal.   He did take a Xanax, which he normally takes at night for sleep. He had been working outside in the heat for several hours, which is not abnormal for him. He did eat breakfast this morning and drank a bottle of water while he was working outside.   His wife notes that he does not drink much water and drinks a lot of diet sodas. Patient notes that he had a similar episode last year at work. He is on metformin qd for his diabetes.  Patient works as a Engineer, building services.  Review of Systems  Cardiovascular: Positive for chest pain and palpitations.  Neurological: Positive for dizziness, syncope, numbness and headaches.       Objective:   Physical Exam CONSTITUTIONAL: Well developed/well nourished HEAD: Normocephalic/atraumatic EYES: EOM/PERRL ENMT: Mucous membranes moist NECK: supple no meningeal signs SPINE: entire spine nontender CV: S1/S2 noted, no murmurs/rubs/gallops noted LUNGS: Lungs are clear to auscultation bilaterally, no apparent distress ABDOMEN: soft, nontender, no rebound or guarding GU: no cva tenderness NEURO: Pt is awake/alert, moves all extremitiesx4 EXTREMITIES: pulses normal, full ROM SKIN: warm, color normal PSYCH: no abnormalities of mood noted   EKG reading: Patient has large Q-wave in lead 3 with  a inverted T-wave in that lead as well. Otherwise he has normal sinus rhythm.  Results for orders placed or performed in visit on 10/07/14  POCT glucose (manual entry)  Result Value Ref Range   POC Glucose 343 (A) 70 - 99 mg/dl  POCT glycosylated hemoglobin (Hb A1C)  Result Value Ref Range   Hemoglobin A1C 7.4        Assessment & Plan:  Given the fact that patient still has some chest pain, and in the context of his syncopal episode, he needs further evaluation at [facility] for possible cardiac involvement or cause of his syncope.  IV will be started and patient was started on oxygen given 4 aspirin and transported.  Signed, Jacob [name].D.

## 2014-10-17 ENCOUNTER — Encounter: Payer: Self-pay | Admitting: Internal Medicine

## 2014-10-17 ENCOUNTER — Ambulatory Visit (INDEPENDENT_AMBULATORY_CARE_PROVIDER_SITE_OTHER): Payer: BLUE CROSS/BLUE SHIELD | Admitting: Internal Medicine

## 2014-10-17 VITALS — BP 122/84 | HR 63 | Temp 98.0°F | Ht 70.0 in | Wt 198.0 lb

## 2014-10-17 DIAGNOSIS — Z0000 Encounter for general adult medical examination without abnormal findings: Secondary | ICD-10-CM

## 2014-10-17 DIAGNOSIS — F418 Other specified anxiety disorders: Secondary | ICD-10-CM

## 2014-10-17 DIAGNOSIS — E119 Type 2 diabetes mellitus without complications: Secondary | ICD-10-CM

## 2014-10-17 DIAGNOSIS — M545 Low back pain, unspecified: Secondary | ICD-10-CM

## 2014-10-17 DIAGNOSIS — M159 Polyosteoarthritis, unspecified: Secondary | ICD-10-CM

## 2014-10-17 DIAGNOSIS — N529 Male erectile dysfunction, unspecified: Secondary | ICD-10-CM | POA: Diagnosis not present

## 2014-10-17 DIAGNOSIS — M150 Primary generalized (osteo)arthritis: Secondary | ICD-10-CM

## 2014-10-17 MED ORDER — TADALAFIL 20 MG PO TABS: 20.0000 mg | ORAL_TABLET | Freq: Every day | ORAL | Status: DC | PRN

## 2014-10-17 MED ORDER — DICLOFENAC SODIUM 2 % TD SOLN: TRANSDERMAL | Status: DC

## 2014-10-17 MED ORDER — HYDROCODONE-ACETAMINOPHEN 7.5-325 MG PO TABS: 1.0000 | ORAL_TABLET | Freq: Every day | ORAL | Status: DC | PRN

## 2014-10-17 MED ORDER — ALPRAZOLAM 0.5 MG PO TABS: ORAL_TABLET | ORAL | Status: DC

## 2014-10-17 NOTE — Progress Notes (Signed)
Pre visit review using our clinic review tool, if applicable. No additional management support is needed unless otherwise documented below in the visit note. 

## 2014-10-17 NOTE — Patient Instructions (Signed)

## 2014-10-17 NOTE — Progress Notes (Signed)
Subjective:    Patient ID: Jacob [name], male    DOB: Apr 02, 1965, 49 y.o.   MRN: [id number]  HPI  Here for wellness and f/u;  Overall doing ok;  Pt denies Chest pain, worsening SOB, DOE, wheezing, orthopnea, PND, worsening LE edema, palpitations, dizziness or syncope.  Pt denies neurological change such as new headache, facial or extremity weakness.  Pt denies polydipsia, polyuria, or low sugar symptoms. Pt states overall good compliance with treatment and medications, good tolerability, and has been trying to follow appropriate diet.  Pt denies worsening depressive symptoms, suicidal ideation or panic. No fever, night sweats, wt loss, loss of appetite, or other constitutional symptoms.  Pt states good ability with ADL's, has low fall risk, home safety reviewed and adequate, no other significant changes in hearing or vision, and only occasionally active with exercise.  Asks for cialis prn, Denies worsening depressive symptoms, suicidal ideation, or panic; has ongoing anxiety, needs med refill. Pain overall controlled, for pain med refill as well' Past Medical History  Diagnosis Date  . Cholesteatoma of right ear   . Anxiety   . GERD (gastroesophageal reflux disease)   . Allergy   . Diabetes mellitus     bordreline  . Hypertension   . Hyperlipidemia 10/18/2014   Past Surgical History  Procedure Laterality Date  . Lumbar disc surgery    . Inner ear surgery      right  . Cholecystectomy    . Tubes in bil ears    . Hypospadias correction N/A 1978    reports that he has never smoked. He has never used smokeless tobacco. He reports that he drinks about 3.0 oz of alcohol per week. He reports that he does not use illicit drugs. family history includes COPD in his mother; Diabetes in his maternal grandmother and paternal grandmother; Heart disease in his father; Hypertension in his mother; Lung cancer in his maternal uncle. There is no history of Colon cancer, Esophageal cancer, Rectal cancer,  or Stomach cancer. No Known Allergies Current Outpatient Prescriptions on File Prior to Visit  Medication Sig Dispense Refill  . aspirin EC 81 MG tablet Take 1 tablet (81 mg total) by mouth daily. 90 tablet 11  . naproxen (NAPROSYN) 500 MG tablet Take 1 tablet (500 mg total) by mouth 2 (two) times daily as needed. (Patient not taking: Reported on 10/17/2014) 60 tablet 5  . ramipril (ALTACE) 10 MG capsule Take 1 capsule (10 mg total) by mouth daily. (Patient not taking: Reported on 10/07/2014) 90 capsule 3  . ramipril (ALTACE) 5 MG capsule Take 5 mg by mouth daily.  1  . triamcinolone cream (KENALOG) 0.1 % Apply 1 application topically 2 (two) times daily.     No current facility-administered medications on file prior to visit.   Review of Systems Constitutional: Negative for increased diaphoresis, other activity, appetite or siginficant weight change other than noted HENT: Negative for worsening hearing loss, ear pain, facial swelling, mouth sores and neck stiffness.   Eyes: Negative for other worsening pain, redness or visual disturbance.  Respiratory: Negative for shortness of breath and wheezing  Cardiovascular: Negative for chest pain and palpitations.  Gastrointestinal: Negative for diarrhea, blood in stool, abdominal distention or other pain Genitourinary: Negative for hematuria, flank pain or change in urine volume.  Musculoskeletal: Negative for myalgias or other joint complaints.  Skin: Negative for color change and wound or drainage.  Neurological: Negative for syncope and numbness. other than noted Hematological: Negative for  adenopathy. or other swelling Psychiatric/Behavioral: Negative for hallucinations, SI, self-injury, decreased concentration or other worsening agitation.      Objective:   Physical Exam BP 122/84 mmHg  Pulse 63  Temp(Src) 98 F (36.7 C) (Oral)  Ht 5\' 10"  (1.778 m)  Wt 198 lb (89.812 kg)  BMI 28.41 kg/m2  SpO2 98% VS noted,  Constitutional: Pt is  oriented to person, place, and time. Appears well-developed and well-nourished, in no significant distress Head: Normocephalic and atraumatic.  Right Ear: External ear normal.  Left Ear: External ear normal.  Nose: Nose normal.  Mouth/Throat: Oropharynx is clear and moist.  Eyes: Conjunctivae and EOM are normal. Pupils are equal, round, and reactive to light.  Neck: Normal range of motion. Neck supple. No JVD present. No tracheal deviation present or significant neck LA or mass Cardiovascular: Normal rate, regular rhythm, normal heart sounds and intact distal pulses.   Pulmonary/Chest: Effort normal and breath sounds without rales or wheezing  Abdominal: Soft. Bowel sounds are normal. NT. No HSM  Musculoskeletal: Normal range of motion. Exhibits no edema.  Lymphadenopathy:  Has no cervical adenopathy.  Neurological: Pt is alert and oriented to person, place, and time. Pt has normal reflexes. No cranial nerve deficit. Motor grossly intact Skin: Skin is warm and dry. No rash noted.  Psychiatric:  Has normal mood and affect. Behavior is normal.     Assessment & Plan:

## 2014-10-18 ENCOUNTER — Encounter: Payer: Self-pay | Admitting: Internal Medicine

## 2014-10-18 ENCOUNTER — Other Ambulatory Visit (INDEPENDENT_AMBULATORY_CARE_PROVIDER_SITE_OTHER): Payer: BLUE CROSS/BLUE SHIELD

## 2014-10-18 ENCOUNTER — Other Ambulatory Visit: Payer: Self-pay | Admitting: Internal Medicine

## 2014-10-18 DIAGNOSIS — E119 Type 2 diabetes mellitus without complications: Secondary | ICD-10-CM

## 2014-10-18 DIAGNOSIS — Z0000 Encounter for general adult medical examination without abnormal findings: Secondary | ICD-10-CM

## 2014-10-18 DIAGNOSIS — E785 Hyperlipidemia, unspecified: Secondary | ICD-10-CM

## 2014-10-18 DIAGNOSIS — Z0189 Encounter for other specified special examinations: Secondary | ICD-10-CM | POA: Diagnosis not present

## 2014-10-18 HISTORY — DX: Hyperlipidemia, unspecified: E78.5

## 2014-10-18 LAB — LIPID PANEL
CHOL/HDL RATIO: 5
Cholesterol: 231 mg/dL — ABNORMAL HIGH (ref 0–200)
HDL: 44.6 mg/dL (ref >39.00)
NonHDL: 186.4
TRIGLYCERIDES: 334 mg/dL — AB (ref 0.0–149.0)
VLDL: 66.8 mg/dL — ABNORMAL HIGH (ref 0.0–40.0)

## 2014-10-18 LAB — URINALYSIS, ROUTINE W REFLEX MICROSCOPIC
BILIRUBIN URINE: NEGATIVE
KETONES UR: NEGATIVE
LEUKOCYTES UA: NEGATIVE
NITRITE: NEGATIVE
Specific Gravity, Urine: 1.025 (ref 1.000–1.030)
Total Protein, Urine: NEGATIVE
Urobilinogen, UA: 0.2 (ref 0.0–1.0)
pH: 6 (ref 5.0–8.0)

## 2014-10-18 LAB — CBC WITH DIFFERENTIAL/PLATELET
Basophils Absolute: 0 10*3/uL (ref 0.0–0.1)
Basophils Relative: 0.3 % (ref 0.0–3.0)
EOS ABS: 0.1 10*3/uL (ref 0.0–0.7)
Eosinophils Relative: 0.9 % (ref 0.0–5.0)
HCT: 46.9 % (ref 39.0–52.0)
Hemoglobin: 16.2 g/dL (ref 13.0–17.0)
LYMPHS PCT: 25.6 % (ref 12.0–46.0)
Lymphs Abs: 1.6 10*3/uL (ref 0.7–4.0)
MCHC: 34.6 g/dL (ref 30.0–36.0)
MCV: 85.8 fl (ref 78.0–100.0)
MONO ABS: 0.5 10*3/uL (ref 0.1–1.0)
MONOS PCT: 8 % (ref 3.0–12.0)
NEUTROS PCT: 65.2 % (ref 43.0–77.0)
Neutro Abs: 4.2 10*3/uL (ref 1.4–7.7)
Platelets: 248 10*3/uL (ref 150.0–400.0)
RBC: 5.46 Mil/uL (ref 4.22–5.81)
RDW: 13.1 % (ref 11.5–15.5)
WBC: 6.4 10*3/uL (ref 4.0–10.5)

## 2014-10-18 LAB — BASIC METABOLIC PANEL
BUN: 13 mg/dL (ref 6–23)
CALCIUM: 10 mg/dL (ref 8.4–10.5)
CO2: 28 meq/L (ref 19–32)
CREATININE: 1.5 mg/dL (ref 0.40–1.50)
Chloride: 105 mEq/L (ref 96–112)
GFR: 53.19 mL/min — ABNORMAL LOW (ref >60.00)
GLUCOSE: 155 mg/dL — AB (ref 70–99)
POTASSIUM: 4.4 meq/L (ref 3.5–5.1)
Sodium: 141 mEq/L (ref 135–145)

## 2014-10-18 LAB — TSH: TSH: 2.17 u[IU]/mL (ref 0.35–4.50)

## 2014-10-18 LAB — MICROALBUMIN / CREATININE URINE RATIO
CREATININE, U: 150.5 mg/dL
MICROALB/CREAT RATIO: 0.7 mg/g (ref 0.0–30.0)
Microalb, Ur: 1 mg/dL (ref 0.0–1.9)

## 2014-10-18 LAB — HEPATIC FUNCTION PANEL
ALBUMIN: 4.8 g/dL (ref 3.5–5.2)
ALK PHOS: 90 U/L (ref 39–117)
ALT: 38 U/L (ref 0–53)
AST: 25 U/L (ref 0–37)
BILIRUBIN DIRECT: 0.1 mg/dL (ref 0.0–0.3)
TOTAL PROTEIN: 7.6 g/dL (ref 6.0–8.3)
Total Bilirubin: 0.5 mg/dL (ref 0.2–1.2)

## 2014-10-18 LAB — LDL CHOLESTEROL, DIRECT: Direct LDL: 144 mg/dL

## 2014-10-18 LAB — HEMOGLOBIN A1C: Hgb A1c MFr Bld: 7.5 % — ABNORMAL HIGH (ref 4.6–6.5)

## 2014-10-18 LAB — PSA: PSA: 0.87 ng/mL (ref 0.10–4.00)

## 2014-10-18 MED ORDER — METFORMIN HCL ER 500 MG PO TB24: ORAL_TABLET | ORAL | Status: DC

## 2014-10-18 MED ORDER — ATORVASTATIN CALCIUM 10 MG PO TABS: 10.0000 mg | ORAL_TABLET | Freq: Every day | ORAL | Status: DC

## 2014-10-19 NOTE — Assessment & Plan Note (Signed)
Ok for cialis prn,  to f/u any worsening symptoms or concerns  

## 2014-10-19 NOTE — Assessment & Plan Note (Signed)
For pain med refill 

## 2014-10-19 NOTE — Assessment & Plan Note (Signed)
stable overall by history and exam, recent data reviewed with pt, and pt to continue medical treatment as before,  to f/u any worsening symptoms or concerns Lab Results  Component Value Date   HGBA1C 7.5* 10/18/2014

## 2014-10-19 NOTE — Assessment & Plan Note (Signed)
stable overall by history and exam,  and pt to continue medical treatment as before,  to f/u any worsening symptoms or concerns, for med refill 

## 2014-10-19 NOTE — Assessment & Plan Note (Signed)

## 2014-11-26 ENCOUNTER — Telehealth: Payer: Self-pay | Admitting: Internal Medicine

## 2014-11-26 NOTE — Telephone Encounter (Signed)
Patient is requesting that we write a letter like the 10/30/2013 stating that" It is my medical opinion that Jacob [name] has obtained good control of his blood pressure and blood sugar with medication..  If you have any questions or concerns, please don't hesitate to call."  He states that he needs to pick it up by mid day

## 2014-11-27 ENCOUNTER — Encounter: Payer: Self-pay | Admitting: Internal Medicine

## 2014-11-27 NOTE — Telephone Encounter (Signed)
Done hardcopy to Dahlia  

## 2014-11-27 NOTE — Telephone Encounter (Signed)
Pt advised, letter in cabinet for pick up

## 2015-01-23 ENCOUNTER — Ambulatory Visit (INDEPENDENT_AMBULATORY_CARE_PROVIDER_SITE_OTHER): Payer: BLUE CROSS/BLUE SHIELD | Admitting: Internal Medicine

## 2015-01-23 ENCOUNTER — Encounter: Payer: Self-pay | Admitting: Internal Medicine

## 2015-01-23 VITALS — BP 138/100 | HR 75 | Temp 98.3°F | Ht 70.0 in | Wt 194.8 lb

## 2015-01-23 DIAGNOSIS — I10 Essential (primary) hypertension: Secondary | ICD-10-CM

## 2015-01-23 DIAGNOSIS — G894 Chronic pain syndrome: Secondary | ICD-10-CM | POA: Diagnosis not present

## 2015-01-23 DIAGNOSIS — R079 Chest pain, unspecified: Secondary | ICD-10-CM

## 2015-01-23 DIAGNOSIS — M545 Low back pain, unspecified: Secondary | ICD-10-CM

## 2015-01-23 DIAGNOSIS — Z0000 Encounter for general adult medical examination without abnormal findings: Secondary | ICD-10-CM

## 2015-01-23 DIAGNOSIS — E119 Type 2 diabetes mellitus without complications: Secondary | ICD-10-CM

## 2015-01-23 MED ORDER — DICLOFENAC SODIUM 2 % TD SOLN: TRANSDERMAL | Status: DC

## 2015-01-23 MED ORDER — HYDROCODONE-ACETAMINOPHEN 7.5-325 MG PO TABS: 1.0000 | ORAL_TABLET | Freq: Every day | ORAL | Status: DC | PRN

## 2015-01-23 NOTE — Patient Instructions (Signed)
Your EKG was OK today  Please continue all other medications as before, and refills have been done if requested - the hydrocodone, and voltaren gel  Please have the pharmacy call with any other refills you may need.  Please keep your appointments with your specialists as you may have planned  Please return in 3 months, or sooner if needed, with Lab testing done 3-5 days before

## 2015-01-23 NOTE — Assessment & Plan Note (Addendum)
ECG reviewed as per emr, atypical, exam benign, stress testing neg approx 1 yr ago, ok to  to f/u any worsening symptoms or concerns

## 2015-01-23 NOTE — Progress Notes (Signed)
Subjective:    Patient ID: Jacob [name], male    DOB: [birth year], 49 y.o.   MRN: [id number]  HPI  Here with c/o CP left side costal margin and lateral chest, sharp, fleeting and intermittent x 1 wk without radiation or assoc symptoms such as nausea, palps, diaphoresis, dizziness. Non exertional, nonpositional, non pleuritic. States " I am a nervous hypochondriac. " Not taking statin, trying to work on lower chol diet with f/u lipids with next labs.  Pt denies increased sob or doe, wheezing, orthopnea, PND, increased LE swelling, or syncope. Pt denies new neurological symptoms such as new headache, or facial or extremity weakness or numbness   Pt denies polydipsia, polyuria,  Chronic pain overall stable, asks for med refill - Pt continues to have recurring LBP without change in severity, bowel or bladder change, fever, wt loss,  worsening LE pain/numbness/weakness, gait change or falls. Past Medical History  Diagnosis Date  . Cholesteatoma of right ear   . Anxiety   . GERD (gastroesophageal reflux disease)   . Allergy   . 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 Hypertension   . Hyperlipidemia 10/18/2014   Past Surgical History  Procedure Laterality Date  . Lumbar disc surgery    . Inner ear surgery      right  . Cholecystectomy    . Tubes in bil ears    . Hypospadias correction N/A 1978    reports that he has never smoked. He has never used smokeless tobacco. He reports that he drinks about 3.0 oz of alcohol per week. He reports that he does not use illicit drugs. family history includes COPD in his mother; Diabetes in his maternal grandmother and paternal grandmother; Heart disease in his father; Hypertension in his mother; Lung cancer in his maternal uncle. There is no history of Colon cancer, Esophageal cancer, Rectal cancer, or Stomach cancer. No Known Allergies Current Outpatient Prescriptions on File Prior to Visit  Medication Sig Dispense Refill  . ALPRAZolam (XANAX) 0.5 MG  tablet For sleep. 1 -2 tabs by mouth as needed at  bedtime 60 tablet 5  . aspirin EC 81 MG tablet Take 1 tablet (81 mg total) by mouth daily. 90 tablet 11  . metFORMIN (GLUCOPHAGE-XR) 500 MG 24 hr tablet 2 tabs by mouth per day 180 tablet 3  . ramipril (ALTACE) 10 MG capsule Take 1 capsule (10 mg total) by mouth daily. 90 capsule 3  . naproxen (NAPROSYN) 500 MG tablet Take 1 tablet (500 mg total) by mouth 2 (two) times daily as needed. (Patient not taking: Reported on 01/23/2015) 60 tablet 5  . tadalafil (CIALIS) 20 MG tablet Take 1 tablet (20 mg total) by mouth daily as needed for erectile dysfunction. (Patient not taking: Reported on 01/23/2015) 10 tablet 11   No current facility-administered medications on file prior to visit.    Review of Systems  Constitutional: Negative for unusual diaphoresis or night sweats HENT: Negative for ringing in ear or discharge Eyes: Negative for double vision or worsening visual disturbance.  Respiratory: Negative for choking and stridor.   Gastrointestinal: Negative for vomiting or other signifcant bowel change Genitourinary: Negative for hematuria or change in urine volume.  Musculoskeletal: Negative for other MSK pain or swelling Skin: Negative for color change and worsening wound.  Neurological: Negative for tremors and numbness other than noted  Psychiatric/Behavioral: Negative for decreased concentration or agitation other than above       Objective:  Physical Exam BP 138/100 mmHg  Pulse 75  Temp(Src) 98.3 F (36.8 C) (Oral)  Ht 5\' 10"  (1.778 m)  Wt 194 lb 12 oz (88.338 kg)  BMI 27.94 kg/m2  SpO2 98% VS noted, not ill appearing  Constitutional: Pt appears in no significant distress HENT: Head: NCAT.  Right Ear: External ear normal.  Left Ear: External ear normal.  Eyes: . Pupils are equal, round, and reactive to light. Conjunctivae and EOM are normal Neck: Normal range of motion. Neck supple.  Cardiovascular: Normal rate and regular  rhythm.   Pulmonary/Chest: Effort normal and breath sounds without rales or wheezing.  Abd:  Soft, NT, ND, + BS Neurological: Pt is alert. Not confused , motor grossly intact Skin: Skin is warm. No rash, no LE edema Psychiatric: Pt behavior is normal. No agitation.   ECG reviewed as per emr    Assessment & Plan:

## 2015-01-27 NOTE — Assessment & Plan Note (Signed)
Ok for pain med refill,  to f/u any worsening symptoms or concerns 

## 2015-01-27 NOTE — Assessment & Plan Note (Signed)
stable overall by history and exam, recent data reviewed with pt, and pt to continue medical treatment as before,  to f/u any worsening symptoms or concerns Lab Results  Component Value Date   HGBA1C 7.5* 10/18/2014    

## 2015-01-27 NOTE — Assessment & Plan Note (Signed)
Overall < 140/90 at home per pt, o/w stable overall by history and exam, recent data reviewed with pt, and pt to continue medical treatment as before,  to f/u any worsening symptoms or concerns BP Readings from Last 3 Encounters:  01/23/15 138/100  10/17/14 122/84  10/07/14 131/85

## 2015-05-08 ENCOUNTER — Other Ambulatory Visit: Payer: Self-pay | Admitting: Internal Medicine

## 2015-05-09 NOTE — Telephone Encounter (Signed)
Please advise patient needs refill

## 2015-05-09 NOTE — Telephone Encounter (Signed)
Done hardcopy to Corinne  

## 2015-05-20 ENCOUNTER — Telehealth: Payer: Self-pay | Admitting: *Deleted

## 2015-05-20 DIAGNOSIS — I10 Essential (primary) hypertension: Secondary | ICD-10-CM

## 2015-05-20 MED ORDER — RAMIPRIL 10 MG PO CAPS: 10.0000 mg | ORAL_CAPSULE | Freq: Every day | ORAL | Status: DC

## 2015-05-20 NOTE — Telephone Encounter (Signed)
Receive call due to pt insurance change he must use [facility] to get his prescription. Pt is requesting his Ramipril to be sent to [facility] on [address]. Inform pt will send...[name]

## 2015-06-05 ENCOUNTER — Encounter: Payer: Self-pay | Admitting: Internal Medicine

## 2015-06-05 ENCOUNTER — Ambulatory Visit (INDEPENDENT_AMBULATORY_CARE_PROVIDER_SITE_OTHER): Payer: BLUE CROSS/BLUE SHIELD | Admitting: Internal Medicine

## 2015-06-05 VITALS — BP 122/82 | HR 80 | Temp 98.6°F | Resp 20 | Wt 196.0 lb

## 2015-06-05 DIAGNOSIS — Z0000 Encounter for general adult medical examination without abnormal findings: Secondary | ICD-10-CM

## 2015-06-05 DIAGNOSIS — I10 Essential (primary) hypertension: Secondary | ICD-10-CM

## 2015-06-05 DIAGNOSIS — E119 Type 2 diabetes mellitus without complications: Secondary | ICD-10-CM

## 2015-06-05 DIAGNOSIS — M545 Low back pain, unspecified: Secondary | ICD-10-CM

## 2015-06-05 DIAGNOSIS — H9193 Unspecified hearing loss, bilateral: Secondary | ICD-10-CM | POA: Diagnosis not present

## 2015-06-05 DIAGNOSIS — H9190 Unspecified hearing loss, unspecified ear: Secondary | ICD-10-CM | POA: Insufficient documentation

## 2015-06-05 DIAGNOSIS — G894 Chronic pain syndrome: Secondary | ICD-10-CM | POA: Diagnosis not present

## 2015-06-05 DIAGNOSIS — G4710 Hypersomnia, unspecified: Secondary | ICD-10-CM | POA: Insufficient documentation

## 2015-06-05 MED ORDER — SILDENAFIL CITRATE 20 MG PO TABS: ORAL_TABLET | ORAL | Status: DC

## 2015-06-05 MED ORDER — HYDROCODONE-ACETAMINOPHEN 7.5-325 MG PO TABS: 1.0000 | ORAL_TABLET | Freq: Every day | ORAL | Status: DC | PRN

## 2015-06-05 MED ORDER — HYDROCODONE-ACETAMINOPHEN 7.5-325 MG PO TABS: 1.0000 | ORAL_TABLET | Freq: Two times a day (BID) | ORAL | Status: DC | PRN

## 2015-06-05 MED ORDER — ALPRAZOLAM 0.5 MG PO TABS: ORAL_TABLET | ORAL | Status: DC

## 2015-06-05 NOTE — Progress Notes (Signed)
Subjective:    Patient ID: Jacob [name], male    DOB: September 07, 1965, 50 y.o.   MRN: [id number]  HPI  Here for wellness and f/u;  Overall doing ok;  Pt denies Chest pain, worsening SOB, DOE, wheezing, orthopnea, PND, worsening LE edema, palpitations, dizziness or syncope.  Pt denies neurological change such as new headache, facial or extremity weakness.  Pt denies polydipsia, polyuria, or low sugar symptoms. Pt states overall good compliance with treatment and medications, good tolerability, and has been trying to follow appropriate diet.  Pt denies worsening depressive symptoms, suicidal ideation or panic. No fever, night sweats, wt loss, loss of appetite, or other constitutional symptoms.  Pt states good ability with ADL's, has low fall risk, home safety reviewed and adequate, no other significant changes in hearing or vision, and only occasionally active with exercise, due to ongoing gradually worsening right knee pain, without swelling, giveaways, falls.  Pt continues to have recurring LBP without change in severity, bowel or bladder change, fever, wt loss,  worsening LE pain/numbness/weakness, gait change or falls, for which he takes infrequent hydrocodone, asks to continue this.  Denies worsening depressive symptoms, suicidal ideation, or panic; has ongoing anxiety,  Also recently with worsening snoring and daytime somnolence.  Denies hyper or hypo thyroid symptoms such as voice, skin or hair change.  Also with bilat hearing loss worse in the past week, ? Wax impactions. Past Medical History  Diagnosis Date  . Cholesteatoma of right ear   . Anxiety   . GERD (gastroesophageal reflux disease)   . Allergy   . 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 Hypertension   . Hyperlipidemia 10/18/2014   Past Surgical History  Procedure Laterality Date  . Lumbar disc surgery    . Inner ear surgery      right  . Cholecystectomy    . Tubes in bil ears    . Hypospadias correction N/A 1978    reports  that he has never smoked. He has never used smokeless tobacco. He reports that he drinks about 3.0 oz of alcohol per week. He reports that he does not use illicit drugs. family history includes COPD in his mother; Diabetes in his maternal grandmother and paternal grandmother; Heart disease in his father; Hypertension in his mother; Lung cancer in his maternal uncle. There is no history of Colon cancer, Esophageal cancer, Rectal cancer, or Stomach cancer. No Known Allergies Current Outpatient Prescriptions on File Prior to Visit  Medication Sig Dispense Refill  . aspirin EC 81 MG tablet Take 1 tablet (81 mg total) by mouth daily. 90 tablet 11  . Diclofenac Sodium 2 % SOLN Apply 1 pump twice daily. 112 g 3  . metFORMIN (GLUCOPHAGE-XR) 500 MG 24 hr tablet 2 tabs by mouth per day 180 tablet 3  . naproxen (NAPROSYN) 500 MG tablet Take 1 tablet (500 mg total) by mouth 2 (two) times daily as needed. 60 tablet 5  . ramipril (ALTACE) 10 MG capsule Take 1 capsule (10 mg total) by mouth daily. 90 capsule 1  . tadalafil (CIALIS) 20 MG tablet Take 1 tablet (20 mg total) by mouth daily as needed for erectile dysfunction. 10 tablet 11   No current facility-administered medications on file prior to visit.   Review of Systems Constitutional: Negative for increased diaphoresis, other activity, appetite or siginficant weight change other than noted HENT: Negative for worsening hearing loss, ear pain, facial swelling, mouth sores and neck stiffness.  Eyes: Negative for other worsening pain, redness or visual disturbance.  Respiratory: Negative for shortness of breath and wheezing  Cardiovascular: Negative for chest pain and palpitations.  Gastrointestinal: Negative for diarrhea, blood in stool, abdominal distention or other pain Genitourinary: Negative for hematuria, flank pain or change in urine volume.  Musculoskeletal: Negative for myalgias or other joint complaints.  Skin: Negative for color change and wound  or drainage.  Neurological: Negative for syncope and numbness. other than noted Hematological: Negative for adenopathy. or other swelling Psychiatric/Behavioral: Negative for hallucinations, SI, self-injury, decreased concentration or other worsening agitation.      Objective:   Physical Exam BP 122/82 mmHg  Pulse 80  Temp(Src) 98.6 F (37 C) (Oral)  Resp 20  Wt 196 lb (88.905 kg)  SpO2 97% VS noted,  Constitutional: Pt is oriented to person, place, and time. Appears well-developed and well-nourished, in no significant distress Head: Normocephalic and atraumatic.  Right Ear: External ear normal.  Left Ear: External ear normal.  Nose: Nose normal.  Bilat canals cleared of bilat wax impactions, o/w hearing improved Mouth/Throat: Oropharynx is clear and moist.  Eyes: Conjunctivae and EOM are normal. Pupils are equal, round, and reactive to light.  Neck: Normal range of motion. Neck supple. No JVD present. No tracheal deviation present or significant neck LA or mass Cardiovascular: Normal rate, regular rhythm, normal heart sounds and intact distal pulses.   Pulmonary/Chest: Effort normal and breath sounds without rales or wheezing  Abdominal: Soft. Bowel sounds are normal. NT. No HSM  Musculoskeletal: Normal range of motion. Exhibits no edema.  Lymphadenopathy:  Has no cervical adenopathy.  Neurological: Pt is alert and oriented to person, place, and time. Pt has normal reflexes. No cranial nerve deficit. Motor grossly intact Skin: Skin is warm and dry. No rash noted.  Psychiatric:  Has normal mood and affect. Behavior is normal. 1+ nervous Right knee with mild crepitus, no effusion, has FROM, NT Spine nontender in midline except mild chronic tender low midline lumbar    Assessment & Plan:

## 2015-06-05 NOTE — Patient Instructions (Addendum)
Your Ears were cleared of wax by irrigation today  Please continue all other medications as before, and refills have been done if requested.  Please have the pharmacy call with any other refills you may need.  Please continue your efforts at being more active, low cholesterol diet, and weight control.  You are otherwise up to date with prevention measures today.  You will be contacted regarding the referral for: pulmonary for the snoring and fatigue/sleepiness  Please keep your appointments with your specialists as you may have planned  Please go to the LAB in the Basement (turn left off the elevator) for the tests to be done tomorrow or soon at your convenience  You will be contacted by phone if any changes need to be made immediately.  Otherwise, you will receive a letter about your results with an explanation, but please check with MyChart first.  Please remember to sign up for MyChart if you have not done so, as this will be important to you in the future with finding out test results, communicating by private email, and scheduling acute appointments online when needed.  Please return in 6 months, or sooner if needed, with Lab testing done 3-5 days before

## 2015-06-05 NOTE — Progress Notes (Signed)
Pre visit review using our clinic review tool, if applicable. No additional management support is needed unless otherwise documented below in the visit note. 

## 2015-06-09 NOTE — Assessment & Plan Note (Signed)
stable overall by history and exam, and pt to continue medical treatment as before,  to f/u any worsening symptoms or concerns, for pain med refill,  to f/u any worsening symptoms or concerns

## 2015-06-09 NOTE — Assessment & Plan Note (Signed)
stable overall by history and exam, recent data reviewed with pt, and pt to continue medical treatment as before,  to f/u any worsening symptoms or concerns Lab Results  Component Value Date   HGBA1C 7.5* 10/18/2014    

## 2015-06-09 NOTE — Assessment & Plan Note (Signed)
With some suspicion for sleep apnea, ok for pulm referral

## 2015-06-09 NOTE — Assessment & Plan Note (Signed)
stable overall by history and exam, recent data reviewed with pt, and pt to continue medical treatment as before,  to f/u any worsening symptoms or concerns BP Readings from Last 3 Encounters:  06/05/15 122/82  01/23/15 138/100  10/17/14 122/84

## 2015-06-09 NOTE — Assessment & Plan Note (Signed)

## 2015-06-09 NOTE — Assessment & Plan Note (Signed)
Improved s/p wax impaction removal,  to f/u any worsening symptoms or concerns

## 2015-07-11 ENCOUNTER — Telehealth: Payer: Self-pay | Admitting: Internal Medicine

## 2015-07-11 NOTE — Telephone Encounter (Signed)
Please verify with pharmacy regarding patient statement  We will consider redo rx if his statement is verified

## 2015-07-11 NOTE — Telephone Encounter (Signed)
Patient called regarding hydrocodone script.   He states that he went to [facility] on [address] to fill and they told him the following: 30 qty sig- take 1 per day  Advised that the script we have in the system shows qty 60 twice per day   He is asking that you issue a new written script for him to take to them.

## 2015-08-29 ENCOUNTER — Other Ambulatory Visit (INDEPENDENT_AMBULATORY_CARE_PROVIDER_SITE_OTHER): Payer: BLUE CROSS/BLUE SHIELD

## 2015-08-29 ENCOUNTER — Ambulatory Visit (INDEPENDENT_AMBULATORY_CARE_PROVIDER_SITE_OTHER): Payer: BLUE CROSS/BLUE SHIELD | Admitting: Internal Medicine

## 2015-08-29 ENCOUNTER — Encounter: Payer: Self-pay | Admitting: Internal Medicine

## 2015-08-29 VITALS — BP 130/80 | HR 71 | Temp 97.9°F | Resp 20 | Wt 190.0 lb

## 2015-08-29 DIAGNOSIS — E119 Type 2 diabetes mellitus without complications: Secondary | ICD-10-CM

## 2015-08-29 DIAGNOSIS — R6889 Other general symptoms and signs: Secondary | ICD-10-CM | POA: Diagnosis not present

## 2015-08-29 DIAGNOSIS — E785 Hyperlipidemia, unspecified: Secondary | ICD-10-CM | POA: Diagnosis not present

## 2015-08-29 DIAGNOSIS — J309 Allergic rhinitis, unspecified: Secondary | ICD-10-CM

## 2015-08-29 DIAGNOSIS — M7712 Lateral epicondylitis, left elbow: Secondary | ICD-10-CM | POA: Diagnosis not present

## 2015-08-29 DIAGNOSIS — M545 Low back pain, unspecified: Secondary | ICD-10-CM

## 2015-08-29 DIAGNOSIS — K219 Gastro-esophageal reflux disease without esophagitis: Secondary | ICD-10-CM

## 2015-08-29 DIAGNOSIS — Z0001 Encounter for general adult medical examination with abnormal findings: Secondary | ICD-10-CM | POA: Diagnosis not present

## 2015-08-29 LAB — URINALYSIS, ROUTINE W REFLEX MICROSCOPIC
Hgb urine dipstick: NEGATIVE
LEUKOCYTES UA: NEGATIVE
NITRITE: NEGATIVE
PH: 6.5 (ref 5.0–8.0)
Specific Gravity, Urine: 1.02 (ref 1.000–1.030)
URINE GLUCOSE: NEGATIVE
Urobilinogen, UA: 1 (ref 0.0–1.0)

## 2015-08-29 LAB — CBC WITH DIFFERENTIAL/PLATELET
BASOS PCT: 0.3 % (ref 0.0–3.0)
Basophils Absolute: 0 10*3/uL (ref 0.0–0.1)
EOS PCT: 2.4 % (ref 0.0–5.0)
Eosinophils Absolute: 0.2 10*3/uL (ref 0.0–0.7)
HCT: 45.9 % (ref 39.0–52.0)
HEMOGLOBIN: 15.8 g/dL (ref 13.0–17.0)
LYMPHS ABS: 1.8 10*3/uL (ref 0.7–4.0)
Lymphocytes Relative: 25.2 % (ref 12.0–46.0)
MCHC: 34.4 g/dL (ref 30.0–36.0)
MCV: 85.8 fl (ref 78.0–100.0)
MONOS PCT: 8.5 % (ref 3.0–12.0)
Monocytes Absolute: 0.6 10*3/uL (ref 0.1–1.0)
NEUTROS PCT: 63.6 % (ref 43.0–77.0)
Neutro Abs: 4.5 10*3/uL (ref 1.4–7.7)
Platelets: 225 10*3/uL (ref 150.0–400.0)
RBC: 5.35 Mil/uL (ref 4.22–5.81)
RDW: 13.5 % (ref 11.5–15.5)
WBC: 7.1 10*3/uL (ref 4.0–10.5)

## 2015-08-29 LAB — LIPID PANEL
Cholesterol: 239 mg/dL — ABNORMAL HIGH (ref 0–200)
HDL: 48.4 mg/dL (ref >39.00)
LDL Cholesterol: 169 mg/dL — ABNORMAL HIGH (ref 0–99)
NONHDL: 190.46
Total CHOL/HDL Ratio: 5
Triglycerides: 109 mg/dL (ref 0.0–149.0)
VLDL: 21.8 mg/dL (ref 0.0–40.0)

## 2015-08-29 LAB — HEPATIC FUNCTION PANEL
ALBUMIN: 4.6 g/dL (ref 3.5–5.2)
ALK PHOS: 74 U/L (ref 39–117)
ALT: 62 U/L — ABNORMAL HIGH (ref 0–53)
AST: 45 U/L — ABNORMAL HIGH (ref 0–37)
BILIRUBIN DIRECT: 0.1 mg/dL (ref 0.0–0.3)
TOTAL PROTEIN: 7.8 g/dL (ref 6.0–8.3)
Total Bilirubin: 0.8 mg/dL (ref 0.2–1.2)

## 2015-08-29 LAB — BASIC METABOLIC PANEL
BUN: 15 mg/dL (ref 6–23)
CALCIUM: 9.6 mg/dL (ref 8.4–10.5)
CO2: 29 meq/L (ref 19–32)
CREATININE: 1.25 mg/dL (ref 0.40–1.50)
Chloride: 102 mEq/L (ref 96–112)
GFR: 65.4 mL/min (ref >60.00)
Glucose, Bld: 116 mg/dL — ABNORMAL HIGH (ref 70–99)
Potassium: 4.3 mEq/L (ref 3.5–5.1)
SODIUM: 137 meq/L (ref 135–145)

## 2015-08-29 LAB — HEMOGLOBIN A1C: Hgb A1c MFr Bld: 7.2 % — ABNORMAL HIGH (ref 4.6–6.5)

## 2015-08-29 MED ORDER — HYDROCODONE-ACETAMINOPHEN 7.5-325 MG PO TABS: 1.0000 | ORAL_TABLET | Freq: Two times a day (BID) | ORAL | Status: DC | PRN

## 2015-08-29 NOTE — Patient Instructions (Addendum)
Please continue all other medications as before, and refills have been done if requested.  Please have the pharmacy call with any other refills you may need.  Please continue your efforts at being more active, low cholesterol diet, and weight control.  You are otherwise up to date with prevention measures today.  Please make an appt with Dr Tamala Julian in this office for the left elbow  Please keep your appointments with your specialists as you may have planned  Please go to the LAB in the Basement (turn left off the elevator) for the tests to be done today  You will be contacted by phone if any changes need to be made immediately.  Otherwise, you will receive a letter about your results with an explanation, but please check with MyChart first.  Please remember to sign up for MyChart if you have not done so, as this will be important to you in the future with finding out test results, communicating by private email, and scheduling acute appointments online when needed.  Please return in 6 months, or sooner if needed, with Lab testing done 3-5 days before

## 2015-08-29 NOTE — Progress Notes (Signed)
Subjective:    Patient ID: Jacob [name], male    DOB: [birth year], 50 y.o.   MRN: [id number]  HPI  Here for wellness and f/u;  Overall doing ok;  Pt denies Chest pain, worsening SOB, DOE, wheezing, orthopnea, PND, worsening LE edema, palpitations, dizziness or syncope.  Pt denies neurological change such as new headache, facial or extremity weakness.  Pt denies polydipsia, polyuria, or low sugar symptoms. Pt states overall good compliance with treatment and medications, good tolerability, and has been trying to follow appropriate diet.  Pt denies worsening depressive symptoms, suicidal ideation or panic. No fever, night sweats, wt loss, loss of appetite, or other constitutional symptoms.  Pt states good ability with ADL's, has low fall risk, home safety reviewed and adequate, no other significant changes in hearing or vision, and occasionally active with exercise.   Also working hard with a physical occupation, c/o of left elbow sharp mod to severe pain, constant, worse to use the arm to work, better to rest but uses every day.  Denies worsening reflux, abd pain, dysphagia, n/v, bowel change or blood. Does have several wks ongoing nasal allergy symptoms with clearish congestion, itch and sneezing, without fever, pain, ST, cough, swelling or wheezing.   Pt denies polydipsia, polyuria, or low sugar symptoms such as weakness or confusion improved with po intake.  Pt states overall good compliance with meds, trying to follow lower cholesterol, diabetic diet, wt overall stable but little exercise however.    Past Medical History  Diagnosis Date  . Cholesteatoma of right ear   . Anxiety   . GERD (gastroesophageal reflux disease)   . Allergy   . 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 Hypertension   . Hyperlipidemia 10/18/2014   Past Surgical History  Procedure Laterality Date  . Lumbar disc surgery    . Inner ear surgery      right  . Cholecystectomy    . Tubes in bil ears    . Hypospadias  correction N/A 1978    reports that he has never smoked. He has never used smokeless tobacco. He reports that he drinks about 3.0 oz of alcohol per week. He reports that he does not use illicit drugs. family history includes COPD in his mother; Diabetes in his maternal grandmother and paternal grandmother; Heart disease in his father; Hypertension in his mother; Lung cancer in his maternal uncle. There is no history of Colon cancer, Esophageal cancer, Rectal cancer, or Stomach cancer. No Known Allergies Current Outpatient Prescriptions on File Prior to Visit  Medication Sig Dispense Refill  . ALPRAZolam (XANAX) 0.5 MG tablet TAKE ONE TO TWO TABLETS BY MOUTH ONCE DAILY AT BEDTIME AS NEEDED 60 tablet 5  . aspirin EC 81 MG tablet Take 1 tablet (81 mg total) by mouth daily. 90 tablet 11  . Diclofenac Sodium 2 % SOLN Apply 1 pump twice daily. 112 g 3  . metFORMIN (GLUCOPHAGE-XR) 500 MG 24 hr tablet 2 tabs by mouth per day 180 tablet 3  . naproxen (NAPROSYN) 500 MG tablet Take 1 tablet (500 mg total) by mouth 2 (two) times daily as needed. 60 tablet 5  . ramipril (ALTACE) 10 MG capsule Take 1 capsule (10 mg total) by mouth daily. 90 capsule 1  . sildenafil (REVATIO) 20 MG tablet Use as directed 50 tablet 2  . tadalafil (CIALIS) 20 MG tablet Take 1 tablet (20 mg total) by mouth daily as needed for erectile dysfunction. 10 tablet  11   No current facility-administered medications on file prior to visit.   Review of Systems Constitutional: Negative for increased diaphoresis, or other activity, appetite or siginficant weight change other than noted HENT: Negative for worsening hearing loss, ear pain, facial swelling, mouth sores and neck stiffness.   Eyes: Negative for other worsening pain, redness or visual disturbance.  Respiratory: Negative for choking or stridor Cardiovascular: Negative for other chest pain and palpitations.  Gastrointestinal: Negative for worsening diarrhea, blood in stool, or  abdominal distention Genitourinary: Negative for hematuria, flank pain or change in urine volume.  Musculoskeletal: Negative for myalgias or other joint complaints.  Skin: Negative for other color change and wound or drainage.  Neurological: Negative for syncope and numbness. other than noted Hematological: Negative for adenopathy. or other swelling Psychiatric/Behavioral: Negative for hallucinations, SI, self-injury, decreased concentration or other worsening agitation.      Objective:   Physical Exam BP 130/80 mmHg  Pulse 71  Temp(Src) 97.9 F (36.6 C) (Oral)  Resp 20  Wt 190 lb (86.183 kg)  SpO2 97% VS noted, not ill appearing Constitutional: Pt is oriented to person, place, and time. Appears well-developed and well-nourished, in no significant distress Head: Normocephalic and atraumatic  Eyes: Conjunctivae and EOM are normal. Pupils are equal, round, and reactive to light Right Ear: External ear normal.  Left Ear: External ear normal Nose: Nose normal.  Bilat tm's with mild erythema.  Max sinus areas non tender.  Pharynx with mild erythema, no exudate Mouth/Throat: Oropharynx is clear and moist  Neck: Normal range of motion. Neck supple. No JVD present. No tracheal deviation present or significant neck LA or mass Cardiovascular: Normal rate, regular rhythm, normal heart sounds and intact distal pulses.   Pulmonary/Chest: Effort normal and breath sounds without rales or wheezing  Abdominal: Soft. Bowel sounds are normal. NT. No HSM  Musculoskeletal: Normal range of motion. Exhibits no edema Lymphadenopathy: Has no cervical adenopathy.  Neurological: Pt is alert and oriented to person, place, and time. Pt has normal reflexes. No cranial nerve deficit. Motor grossly intact Skin: Skin is warm and dry. No rash noted or new ulcers Psychiatric:  Has normal mood and affect. Behavior is normal. ] Left elbow with mod tender and swelling to left lateral epicondyle    Assessment & Plan:

## 2015-08-29 NOTE — Progress Notes (Signed)
Pre visit review using our clinic review tool, if applicable. No additional management support is needed unless otherwise documented below in the visit note. 

## 2015-08-30 LAB — MICROALBUMIN / CREATININE URINE RATIO
Creatinine,U: 299.5 mg/dL
MICROALB/CREAT RATIO: 0.4 mg/g (ref 0.0–30.0)
Microalb, Ur: 1.1 mg/dL (ref 0.0–1.9)

## 2015-08-30 LAB — TSH: TSH: 1.58 u[IU]/mL (ref 0.35–4.50)

## 2015-08-30 LAB — PSA: PSA: 0.85 ng/mL (ref 0.10–4.00)

## 2015-08-31 DIAGNOSIS — M7712 Lateral epicondylitis, left elbow: Secondary | ICD-10-CM | POA: Insufficient documentation

## 2015-08-31 NOTE — Assessment & Plan Note (Signed)
stable overall by history and exam, recent data reviewed with pt, and pt to continue medical treatment as before,  to f/u any worsening symptoms or concerns, for prn pain med refill

## 2015-08-31 NOTE — Assessment & Plan Note (Signed)
stable overall by history and exam, recent data reviewed with pt, and pt to continue medical treatment as before,  to f/u any worsening symptoms or concerns Lab Results  Component Value Date   LDLCALC 169* 08/29/2015   D/w pt, declines statin

## 2015-08-31 NOTE — Assessment & Plan Note (Signed)
Lab Results  Component Value Date   HGBA1C 7.2* 08/29/2015   Improved, stable overall by history and exam, recent data reviewed with pt, and pt to continue medical treatment as before,  to f/u any worsening symptoms or concerns

## 2015-08-31 NOTE — Assessment & Plan Note (Signed)
stable overall by history and exam, and pt to continue medical treatment as before,  to f/u any worsening symptoms or concerns 

## 2015-08-31 NOTE — Assessment & Plan Note (Signed)

## 2015-08-31 NOTE — Assessment & Plan Note (Signed)
Mild to mod, for otc zyrtec/nasacort asd,  to f/u any worsening symptoms or concerns 

## 2015-08-31 NOTE — Assessment & Plan Note (Addendum)
Mod to severe per pt, sport med referral, liekly needs cortisone inj  In addition to the time spent performing CPE, I spent an additional 25 minutes face to face,in which greater than 50% of this time was spent in counseling and coordination of care for patient's acute illness as documented.

## 2015-09-26 ENCOUNTER — Observation Stay (HOSPITAL_COMMUNITY)
Admission: EM | Admit: 2015-09-26 | Discharge: 2015-09-28 | Disposition: A | Discharge status: Home / Self Care | Payer: BLUE CROSS/BLUE SHIELD | Attending: Internal Medicine | Admitting: Internal Medicine

## 2015-09-26 ENCOUNTER — Encounter (HOSPITAL_COMMUNITY): Payer: Self-pay | Admitting: Emergency Medicine

## 2015-09-26 DIAGNOSIS — R0600 Dyspnea, unspecified: Secondary | ICD-10-CM | POA: Insufficient documentation

## 2015-09-26 DIAGNOSIS — Z79899 Other long term (current) drug therapy: Secondary | ICD-10-CM | POA: Insufficient documentation

## 2015-09-26 DIAGNOSIS — E785 Hyperlipidemia, unspecified: Secondary | ICD-10-CM | POA: Diagnosis not present

## 2015-09-26 DIAGNOSIS — F419 Anxiety disorder, unspecified: Secondary | ICD-10-CM | POA: Insufficient documentation

## 2015-09-26 DIAGNOSIS — K219 Gastro-esophageal reflux disease without esophagitis: Secondary | ICD-10-CM | POA: Diagnosis not present

## 2015-09-26 DIAGNOSIS — E119 Type 2 diabetes mellitus without complications: Secondary | ICD-10-CM

## 2015-09-26 DIAGNOSIS — Z7984 Long term (current) use of oral hypoglycemic drugs: Secondary | ICD-10-CM | POA: Diagnosis not present

## 2015-09-26 DIAGNOSIS — R55 Syncope and collapse: Principal | ICD-10-CM | POA: Diagnosis present

## 2015-09-26 DIAGNOSIS — Z1389 Encounter for screening for other disorder: Secondary | ICD-10-CM

## 2015-09-26 DIAGNOSIS — M545 Low back pain, unspecified: Secondary | ICD-10-CM | POA: Diagnosis present

## 2015-09-26 DIAGNOSIS — R001 Bradycardia, unspecified: Secondary | ICD-10-CM | POA: Diagnosis not present

## 2015-09-26 DIAGNOSIS — M1990 Unspecified osteoarthritis, unspecified site: Secondary | ICD-10-CM | POA: Diagnosis not present

## 2015-09-26 DIAGNOSIS — Z7982 Long term (current) use of aspirin: Secondary | ICD-10-CM | POA: Diagnosis not present

## 2015-09-26 DIAGNOSIS — I10 Essential (primary) hypertension: Secondary | ICD-10-CM | POA: Diagnosis present

## 2015-09-26 LAB — URINALYSIS, ROUTINE W REFLEX MICROSCOPIC
Bilirubin Urine: NEGATIVE
Glucose, UA: 500 mg/dL — AB
HGB URINE DIPSTICK: NEGATIVE
Ketones, ur: NEGATIVE mg/dL
LEUKOCYTES UA: NEGATIVE
Nitrite: NEGATIVE
Protein, ur: NEGATIVE mg/dL
SPECIFIC GRAVITY, URINE: 1.018 (ref 1.005–1.030)
pH: 7 (ref 5.0–8.0)

## 2015-09-26 LAB — BASIC METABOLIC PANEL
ANION GAP: 7 (ref 5–15)
BUN: 15 mg/dL (ref 6–20)
CALCIUM: 9.1 mg/dL (ref 8.9–10.3)
CHLORIDE: 106 mmol/L (ref 101–111)
CO2: 24 mmol/L (ref 22–32)
Creatinine, Ser: 1.1 mg/dL (ref 0.61–1.24)
GFR calc non Af Amer: >60 mL/min (ref >60)
Glucose, Bld: 177 mg/dL — ABNORMAL HIGH (ref 65–99)
Potassium: 3.9 mmol/L (ref 3.5–5.1)
Sodium: 137 mmol/L (ref 135–145)

## 2015-09-26 LAB — CBC
HCT: 42.3 % (ref 39.0–52.0)
HEMOGLOBIN: 15 g/dL (ref 13.0–17.0)
MCH: 29.5 pg (ref 26.0–34.0)
MCHC: 35.5 g/dL (ref 30.0–36.0)
MCV: 83.1 fL (ref 78.0–100.0)
Platelets: 193 10*3/uL (ref 150–400)
RBC: 5.09 MIL/uL (ref 4.22–5.81)
RDW: 12.6 % (ref 11.5–15.5)
WBC: 6 10*3/uL (ref 4.0–10.5)

## 2015-09-26 LAB — CBG MONITORING, ED: GLUCOSE-CAPILLARY: 137 mg/dL — AB (ref 65–99)

## 2015-09-26 MED ORDER — SODIUM CHLORIDE 0.9 % IV BOLUS (SEPSIS)
1000.0000 mL | Freq: Once | INTRAVENOUS | Status: AC
  Administered 2015-09-27: 1000 mL via INTRAVENOUS

## 2015-09-26 NOTE — ED Provider Notes (Signed)
CSN: [id number]     Arrival date & time 09/26/15  1942 History  By signing my name below, I, Reola Mosher, attest that this documentation has been prepared under the direction and in the presence of Conner Neiss, MD.  Electronically Signed: Reola Mosher, ED Scribe. 09/26/2015. 11:53 PM.    Chief Complaint  Patient presents with  . Loss of Consciousness  . Fall   Patient is a 50 y.o. male presenting with syncope. The history is provided by the patient. No language interpreter was used.  Loss of Consciousness Episode history:  Single Most recent episode:  Today Timing:  Constant Progression:  Resolved Chronicity:  New Context: not medication change   Witnessed: yes   Relieved by:  Nothing Worsened by:  Nothing tried Ineffective treatments:  None tried Associated symptoms: no chest pain, no diaphoresis, no shortness of breath and no vomiting   Risk factors: no seizures    HPI Comments: Jacob [name] is a 50 y.o. male with a PMHx significant for anxiety, GERD, DM, HTN, and HLD who presents to the Emergency Department after a syncopal episode s/p ground level fall that occurred PTA. He notes that he was eating dinner when the episode began, and was sitting in a chair when he fell to the floor and was out of consciousness for a few seconds. He states that during the fall his family told him that he hit his right temple on the ground. Pt also states that he was unable to speak clearly on the way to the hospital after the episode. 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 He notes that he has had similar episodes of LOC in the past, and he attributes them to dehydration. Pt reports that he has been drinking water, but today PTA it was not adequate. He states that he felt dizzy today before the episode while at work. He took Meclozine and Xanax just after the episode. He denies CP  or SOB prior to the episode or while in the ED. He is not complaining of any other symptoms or injuries while in the ED.   Past Medical History  Diagnosis Date  . Cholesteatoma of right ear   . Anxiety   . GERD (gastroesophageal reflux disease)   . Allergy   . 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 Hypertension   . Hyperlipidemia 10/18/2014   Past Surgical History  Procedure Laterality Date  . Lumbar disc surgery    . Inner ear surgery      right  . Cholecystectomy    . Tubes in bil ears    . Hypospadias correction N/A 1978   Family History  Problem Relation Age of Onset  . COPD Mother   . Hypertension Mother   . Heart disease Father   . Lung cancer Maternal Uncle   . Diabetes Paternal Grandmother   . Diabetes Maternal Grandmother   . Colon cancer Neg Hx   . Esophageal cancer Neg Hx   . Rectal cancer Neg Hx   . Stomach cancer Neg Hx    Social History  Substance Use Topics  . Smoking status: Never Smoker   . Smokeless tobacco: Never Used  . Alcohol Use: 3.0 oz/week    5 Glasses of wine per week     Comment: social    Review of  Systems  Constitutional: Negative for diaphoresis.  Respiratory: Negative for shortness of breath.   Cardiovascular: Positive for syncope. Negative for chest pain.  Gastrointestinal: Negative for vomiting.  Neurological: Positive for syncope and speech difficulty.  All other systems reviewed and are negative.  Allergies  Review of patient's allergies indicates no known allergies.  Home Medications   Prior to Admission medications   Medication Sig Start Date End Date Taking? Authorizing Provider  ALPRAZolam Duanne Moron) 0.5 MG tablet TAKE ONE TO TWO TABLETS BY MOUTH ONCE DAILY AT BEDTIME AS NEEDED 06/05/15  Yes Biagio Borg, MD  aspirin EC 81 MG tablet Take 1 tablet (81 mg total) by mouth daily. 05/09/14  Yes Biagio Borg, MD  Diclofenac Sodium 2 % SOLN Apply 1 pump twice daily. 01/23/15  Yes Biagio Borg, MD  metFORMIN (GLUCOPHAGE-XR) 500  MG 24 hr tablet 2 tabs by mouth per day 10/18/14  Yes Biagio Borg, MD  Multiple Vitamin (MULTIVITAMIN WITH MINERALS) TABS tablet Take 1 tablet by mouth daily.   Yes Historical Provider, MD  Omega-3 Fatty Acids (FISH OIL PO) Take 1 capsule by mouth daily.   Yes Historical Provider, MD  ramipril (ALTACE) 10 MG capsule Take 1 capsule (10 mg total) by mouth daily. 05/20/15  Yes Biagio Borg, MD  tadalafil (CIALIS) 20 MG tablet Take 1 tablet (20 mg total) by mouth daily as needed for erectile dysfunction. 10/17/14  Yes Biagio Borg, MD  HYDROcodone-acetaminophen (NORCO) 7.5-325 MG tablet Take 1 tablet by mouth 2 (two) times daily as needed. Patient not taking: Reported on 09/26/2015 08/29/15   Biagio Borg, MD  naproxen (NAPROSYN) 500 MG tablet Take 1 tablet (500 mg total) by mouth 2 (two) times daily as needed. Patient not taking: Reported on 09/26/2015 05/09/14   Biagio Borg, MD  sildenafil (REVATIO) 20 MG tablet Use as directed Patient not taking: Reported on 09/26/2015 06/05/15   Biagio Borg, MD   BP 156/102 mmHg  Pulse 88  Temp(Src) 97.8 F (36.6 C) (Oral)  Resp 16  SpO2 99%   Physical Exam  Constitutional: He is oriented to person, place, and time. He appears well-developed and well-nourished. No distress.  HENT:  Head: Normocephalic and atraumatic.  Mouth/Throat: Oropharynx is clear and moist. No oropharyngeal exudate.  Eyes: Conjunctivae and EOM are normal. Pupils are equal, round, and reactive to light.  Neck: Trachea normal and normal range of motion. Neck supple. No JVD present. Carotid bruit is not present.  Cardiovascular: Normal rate, regular rhythm and normal heart sounds.  Exam reveals no gallop and no friction rub.   No murmur heard. Pulses:      Dorsalis pedis pulses are 2+ on the right side, and 2+ on the left side.  Pulmonary/Chest: Effort normal and breath sounds normal. No stridor. He has no wheezes. He has no rales.  Abdominal: Soft. Bowel sounds are normal. There is no  tenderness. There is no rebound and no guarding.  Musculoskeletal: Normal range of motion.  Lymphadenopathy:    He has no cervical adenopathy.  Neurological: He is alert and oriented to person, place, and time. He has normal reflexes. No cranial nerve deficit. He exhibits normal muscle tone. Coordination normal.  Cranial nerves 2-12 intact.  Skin: Skin is warm and dry. He is not diaphoretic.  Psychiatric: He has a normal mood and affect. His behavior is normal.    ED Course  Procedures (including critical care time)  DIAGNOSTIC STUDIES: Oxygen Saturation is  99% on RA, normal by my interpretation.   COORDINATION OF CARE: 11:39 PM-Discussed next steps with pt including DG Chest, CT Head, UA, Troponin, CBC, and UA. Pt verbalized understanding and is agreeable with the plan.   Labs Review Labs Reviewed  BASIC METABOLIC PANEL - Abnormal; Notable for the following:    Glucose, Bld 177 (*)    All other components within normal limits  URINALYSIS, ROUTINE W REFLEX MICROSCOPIC (NOT AT [facility]) - Abnormal; Notable for the following:    Glucose, UA 500 (*)    All other components within normal limits  CBC  CBG MONITORING, ED    Imaging Review No results found.  I have personally reviewed and evaluated these images and lab results as part of my medical decision-making.   EKG Interpretation   Date/Time:  Thursday September 26 2015 19:51:54 EDT Ventricular Rate:  61 PR Interval:    QRS Duration: 85 QT Interval:  422 QTC Calculation: 426 R Axis:   60 Text Interpretation:  Sinus rhythm Confirmed by Jeneen Rinks  MD, [place] ([id number]) on  09/26/2015 7:56:29 PM      MDM   Final diagnoses:  None   2:32 AM- Dr. Wallie Char at [facility] [place] was consulted. Pt will be transported to [facility] for further workup.   Filed Vitals:   09/26/15 1944 09/27/15 0005  BP: 156/102 146/88  Pulse: 88 60  Temp: 97.8 F (36.6 C)   Resp: 16 18   Results for orders placed or performed during the hospital  encounter of [id number]  Basic metabolic panel  Result Value Ref Range   Sodium 137 135 - 145 mmol/L   Potassium 3.9 3.5 - 5.1 mmol/L   Chloride 106 101 - 111 mmol/L   CO2 24 22 - 32 mmol/L   Glucose, Bld 177 (H) 65 - 99 mg/dL   BUN 15 6 - 20 mg/dL   Creatinine, Ser 1.10 0.61 - 1.24 mg/dL   Calcium 9.1 8.9 - 10.3 mg/dL   GFR calc non Af Amer >60 >60 mL/min   GFR calc Af Amer >60 >60 mL/min   Anion gap 7 5 - 15  CBC  Result Value Ref Range   WBC 6.0 4.0 - 10.5 K/uL   RBC 5.09 4.22 - 5.81 MIL/uL   Hemoglobin 15.0 13.0 - 17.0 g/dL   HCT 42.3 39.0 - 52.0 %   MCV 83.1 78.0 - 100.0 fL   MCH 29.5 26.0 - 34.0 pg   MCHC 35.5 30.0 - 36.0 g/dL   RDW 12.6 11.5 - 15.5 %   Platelets 193 150 - 400 K/uL  Urinalysis, Routine w reflex microscopic  Result Value Ref Range   Color, Urine YELLOW YELLOW   APPearance CLEAR CLEAR   Specific Gravity, Urine 1.018 1.005 - 1.030   pH 7.0 5.0 - 8.0   Glucose, UA 500 (A) NEGATIVE mg/dL   Hgb urine dipstick NEGATIVE NEGATIVE   Bilirubin Urine NEGATIVE NEGATIVE   Ketones, ur NEGATIVE NEGATIVE mg/dL   Protein, ur NEGATIVE NEGATIVE mg/dL   Nitrite NEGATIVE NEGATIVE   Leukocytes, UA NEGATIVE NEGATIVE  Urinalysis, Routine w reflex microscopic (not at [facility])  Result Value Ref Range   Color, Urine YELLOW YELLOW   APPearance CLEAR CLEAR   Specific Gravity, Urine 1.011 1.005 - 1.030   pH 7.0 5.0 - 8.0   Glucose, UA NEGATIVE NEGATIVE mg/dL   Hgb urine dipstick NEGATIVE NEGATIVE   Bilirubin Urine NEGATIVE NEGATIVE   Ketones, ur NEGATIVE NEGATIVE mg/dL  Protein, ur NEGATIVE NEGATIVE mg/dL   Nitrite NEGATIVE NEGATIVE   Leukocytes, UA NEGATIVE NEGATIVE  CBG monitoring, ED  Result Value Ref Range   Glucose-Capillary 137 (H) 65 - 99 mg/dL  I-stat troponin, ED  Result Value Ref Range   Troponin i, poc 0.00 0.00 - 0.08 ng/mL   Comment 3           Dg Chest 2 View  09/27/2015  CLINICAL DATA:  Syncope.  Intermittent dyspnea. EXAM: CHEST  2 VIEW COMPARISON:   10/07/2014 FINDINGS: The heart size and mediastinal contours are within normal limits. Both lungs are clear. The visualized skeletal structures are unremarkable. IMPRESSION: No active cardiopulmonary disease. Electronically Signed   By: Andreas Newport M.D.   On: 09/27/2015 01:41   Ct Head Wo Contrast  09/27/2015  CLINICAL DATA:  50 year old male with syncope EXAM: CT HEAD WITHOUT CONTRAST TECHNIQUE: Contiguous axial images were obtained from the base of the skull through the vertex without intravenous contrast. COMPARISON:  Head CT dated 12/24/2011 FINDINGS: The ventricles and the sulci are appropriate in size for the patient's age. There is no intracranial hemorrhage. No midline shift or mass effect identified. The gray-white matter differentiation is preserved. The visualized paranasal sinuses and mastoid air cells are well aerated. No acute calvarial pathology. IMPRESSION: No acute intracranial pathology. Electronically Signed   By: Anner Crete M.D.   On: 09/27/2015 01:40    Medications  sodium chloride 0.9 % bolus 1,000 mL (not administered)  multivitamin with minerals tablet 1 tablet (not administered)  omega-3 acid ethyl esters (LOVAZA) capsule 1 g (not administered)  ALPRAZolam (XANAX) tablet 0.5 mg (not administered)  ramipril (ALTACE) capsule 10 mg (not administered)  0.9 %  sodium chloride infusion (not administered)  insulin aspart (novoLOG) injection 0-9 Units (not administered)  insulin aspart (novoLOG) injection 0-5 Units (not administered)   stroke: mapping our early stages of recovery book (not administered)  0.9 %  sodium chloride infusion (not administered)  senna-docusate (Senokot-S) tablet 1 tablet (not administered)  enoxaparin (LOVENOX) injection 40 mg (not administered)  aspirin suppository 300 mg (not administered)    Or  aspirin tablet 325 mg (not administered)  sodium chloride 0.9 % bolus 1,000 mL (1,000 mLs Intravenous New Bag/Given 09/27/15 0054)    To cone  for TIA work up  I personally performed the services described in this documentation, which was scribed in my presence. The recorded information has been reviewed and is accurate.      Veatrice Kells, MD 09/27/15 [phone]

## 2015-09-26 NOTE — ED Notes (Signed)
Bed: WLPT3 Expected date:  Expected time:  Means of arrival:  Comments: 

## 2015-09-26 NOTE — ED Notes (Addendum)
Pt was in his house eating dinner and fell out of his chair. Pt sts he passed out. Family sts he "was out" for a few seconds. Pt his R hit and R temple on kitchen floor. Pt sts he also was unable to talk on the way to the hospital, that all he could do was mumble. Pt has hx of anxiety. Pt speaking in complete sentences at this time. Pt sts he remembers not being able to talk clearly. Pt sts "Everythign is foggy right now." Pt sts this has happened multiple times before and it's always due to dehydration.

## 2015-09-27 ENCOUNTER — Emergency Department (HOSPITAL_COMMUNITY): Payer: BLUE CROSS/BLUE SHIELD

## 2015-09-27 ENCOUNTER — Observation Stay (HOSPITAL_COMMUNITY): Payer: BLUE CROSS/BLUE SHIELD

## 2015-09-27 ENCOUNTER — Encounter (HOSPITAL_COMMUNITY): Payer: Self-pay | Admitting: Emergency Medicine

## 2015-09-27 DIAGNOSIS — E119 Type 2 diabetes mellitus without complications: Secondary | ICD-10-CM

## 2015-09-27 DIAGNOSIS — E785 Hyperlipidemia, unspecified: Secondary | ICD-10-CM | POA: Diagnosis not present

## 2015-09-27 DIAGNOSIS — I10 Essential (primary) hypertension: Secondary | ICD-10-CM | POA: Diagnosis not present

## 2015-09-27 DIAGNOSIS — M545 Low back pain: Secondary | ICD-10-CM

## 2015-09-27 DIAGNOSIS — R55 Syncope and collapse: Secondary | ICD-10-CM | POA: Diagnosis not present

## 2015-09-27 LAB — RAPID URINE DRUG SCREEN, HOSP PERFORMED
Amphetamines: NOT DETECTED
BARBITURATES: NOT DETECTED
Benzodiazepines: NOT DETECTED
Cocaine: NOT DETECTED
Opiates: NOT DETECTED
TETRAHYDROCANNABINOL: NOT DETECTED

## 2015-09-27 LAB — GLUCOSE, CAPILLARY
GLUCOSE-CAPILLARY: 136 mg/dL — AB (ref 65–99)
GLUCOSE-CAPILLARY: 128 mg/dL — AB (ref 65–99)
GLUCOSE-CAPILLARY: 155 mg/dL — AB (ref 65–99)
Glucose-Capillary: 151 mg/dL — ABNORMAL HIGH (ref 65–99)

## 2015-09-27 LAB — I-STAT TROPONIN, ED: TROPONIN I, POC: 0 ng/mL (ref 0.00–0.08)

## 2015-09-27 LAB — URINALYSIS, ROUTINE W REFLEX MICROSCOPIC
BILIRUBIN URINE: NEGATIVE
GLUCOSE, UA: NEGATIVE mg/dL
HGB URINE DIPSTICK: NEGATIVE
KETONES UR: NEGATIVE mg/dL
Leukocytes, UA: NEGATIVE
NITRITE: NEGATIVE
PH: 7 (ref 5.0–8.0)
Protein, ur: NEGATIVE mg/dL
SPECIFIC GRAVITY, URINE: 1.011 (ref 1.005–1.030)

## 2015-09-27 LAB — CBG MONITORING, ED: Glucose-Capillary: 110 mg/dL — ABNORMAL HIGH (ref 65–99)

## 2015-09-27 MED ORDER — OMEGA-3-ACID ETHYL ESTERS 1 G PO CAPS
1.0000 g | ORAL_CAPSULE | Freq: Every day | ORAL | Status: DC
  Filled 2015-09-27 (×2): qty 1

## 2015-09-27 MED ORDER — INSULIN ASPART 100 UNIT/ML ~~LOC~~ SOLN: 0.0000 [IU] | Freq: Every day | SUBCUTANEOUS | Status: DC

## 2015-09-27 MED ORDER — ENOXAPARIN SODIUM 40 MG/0.4ML ~~LOC~~ SOLN
40.0000 mg | Freq: Every day | SUBCUTANEOUS | Status: DC
  Filled 2015-09-27 (×3): qty 0.4

## 2015-09-27 MED ORDER — RAMIPRIL 10 MG PO CAPS
10.0000 mg | ORAL_CAPSULE | Freq: Every day | ORAL | Status: DC
  Administered 2015-09-27 – 2015-09-28 (×2): 10 mg via ORAL
  Filled 2015-09-27 (×2): qty 1
  Filled 2015-09-27 (×2): qty 2

## 2015-09-27 MED ORDER — DICLOFENAC SODIUM 1 % TD GEL
2.0000 g | Freq: Three times a day (TID) | TRANSDERMAL | Status: DC | PRN
  Filled 2015-09-27: qty 100

## 2015-09-27 MED ORDER — LORAZEPAM 2 MG/ML IJ SOLN: 1.0000 mg | Freq: Once | INTRAMUSCULAR | Status: DC

## 2015-09-27 MED ORDER — INSULIN ASPART 100 UNIT/ML ~~LOC~~ SOLN: 0.0000 [IU] | Freq: Three times a day (TID) | SUBCUTANEOUS | Status: DC

## 2015-09-27 MED ORDER — ADULT MULTIVITAMIN W/MINERALS CH
1.0000 | ORAL_TABLET | Freq: Every day | ORAL | Status: DC
  Filled 2015-09-27 (×2): qty 1

## 2015-09-27 MED ORDER — SODIUM CHLORIDE 0.9 % IV SOLN: INTRAVENOUS | Status: DC

## 2015-09-27 MED ORDER — ASPIRIN 325 MG PO TABS
325.0000 mg | ORAL_TABLET | Freq: Every day | ORAL | Status: DC
Start: 2015-09-27 — End: 2015-09-28
  Administered 2015-09-27 – 2015-09-28 (×2): 325 mg via ORAL
  Filled 2015-09-27 (×2): qty 1

## 2015-09-27 MED ORDER — SODIUM CHLORIDE 0.9 % IV SOLN
INTRAVENOUS | Status: DC
Start: 2015-09-27 — End: 2015-09-27
  Administered 2015-09-27: 1000 mL via INTRAVENOUS

## 2015-09-27 MED ORDER — ASPIRIN 300 MG RE SUPP: 300.0000 mg | Freq: Every day | RECTAL | Status: DC

## 2015-09-27 MED ORDER — SENNOSIDES-DOCUSATE SODIUM 8.6-50 MG PO TABS: 1.0000 | ORAL_TABLET | Freq: Every evening | ORAL | Status: DC | PRN

## 2015-09-27 MED ORDER — SODIUM CHLORIDE 0.9 % IV BOLUS (SEPSIS)
1000.0000 mL | Freq: Once | INTRAVENOUS | Status: AC
  Administered 2015-09-27: 1000 mL via INTRAVENOUS

## 2015-09-27 MED ORDER — STROKE: EARLY STAGES OF RECOVERY BOOK
Freq: Once | Status: AC
  Administered 2015-09-27: 07:00:00
  Filled 2015-09-27 (×2): qty 1

## 2015-09-27 MED ORDER — ALPRAZOLAM 0.5 MG PO TABS: 0.5000 mg | ORAL_TABLET | Freq: Two times a day (BID) | ORAL | Status: DC | PRN

## 2015-09-27 NOTE — Evaluation (Signed)
Clinical/Bedside Swallow Evaluation Patient Details  Name: Jacob [name] MRN: [id number] Date of Birth: 1965/10/06  Today's Date: 09/27/2015 Time: SLP Start Time (ACUTE ONLY): 0805 SLP Stop Time (ACUTE ONLY): 0830 SLP Time Calculation (min) (ACUTE ONLY): 25 min  Past Medical History:  Past Medical History  Diagnosis Date  . Cholesteatoma of right ear   . Anxiety   . GERD (gastroesophageal reflux disease)   . Allergy   . 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 Hypertension   . Hyperlipidemia 10/18/2014   Past Surgical History:  Past Surgical History  Procedure Laterality Date  . Lumbar disc surgery    . Inner ear surgery      right  . Cholecystectomy    . Tubes in bil ears    . Hypospadias correction N/A 1978   HPI:  50 yo male adm to [facility] after AMS, "out" on floor at home.  PMH + for GERD, anxiety, HTN, DM.  Pt failed an RNSSS and SLP was ordered.  CXR negative, CT head negative. Pt reports he used to receive SLP services in school as a child.  Pt relays symptoms of passing out are likely due to his fatigue.    Assessment / Plan / Recommendation Clinical Impression  Pt presents with functional oropharyngeal swallow ability based on clinical swallow evaluation.  No indication of airway compromise, negative CN exam and clear voice throughout.  Pt was even challenged with sequential thin bolus swallows with good tolerance.  Pt admits to h/o dysphagia to foods and pills for "years" requiring him to swallow his pills with sodas.  He denies weight loss, pneumonias nor requiring heimlich maneuver.  H/O Endoscopy 12/2011 = with findings of gastritis and pt was empirically dilated. Per GI MD, pt was to continue his PPI daily 20-30 minutes before breakfast.  SLP reviewed endoscopy findings with pt.  Pt states he takes his PPI in the evening and consumes apple cider vinegar.  Recommend regular/thin diet with general precautions.     Aspiration Risk  No limitations    Diet Recommendation  Regular;Thin liquid   Liquid Administration via: Cup;Straw Medication Administration:  (with soda per pt) Supervision: Patient able to self feed Compensations: Slow rate;Small sips/bites Postural Changes: Seated upright at 90 degrees;Remain upright for at least 30 minutes after po intake    Other  Recommendations   none  Follow up Recommendations    n/a   Frequency and Duration   none         Prognosis   n/a     Swallow Study   General Date of Onset: 09/27/15 HPI: 50 yo male adm to [facility] after AMS, "out" on floor at home.  PMH + for GERD, anxiety, HTN, DM.  Pt failed an RNSSS and SLP was ordered.  CXR negative, CT head negative. Pt reports he used to receive SLP services in school as a child.   Type of Study: Bedside Swallow Evaluation Diet Prior to this Study: NPO Temperature Spikes Noted: No Respiratory Status: Room air History of Recent Intubation: No Behavior/Cognition: Alert;Cooperative;Pleasant mood Oral Cavity Assessment: Within Functional Limits Oral Care Completed by SLP: No Oral Cavity - Dentition: Adequate natural dentition Vision: Functional for self-feeding Self-Feeding Abilities: Able to feed self Patient Positioning: Upright in bed Baseline Vocal Quality: Normal Volitional Cough: Strong Volitional Swallow: Able to elicit    Oral/Motor/Sensory Function Overall Oral Motor/Sensory Function: Within functional limits   Ice Chips Ice chips: Not tested   Thin  Liquid Thin Liquid: Within functional limits Presentation: Straw    Nectar Thick Nectar Thick Liquid: Not tested   Honey Thick Honey Thick Liquid: Not tested   Puree Puree: Within functional limits Presentation: Spoon;Self Fed   Solid   GO   Solid: Within functional limits Presentation: Self Fed    Functional Assessment Tool Used: clinical judgement Functional Limitations: Swallowing Swallow Current Status [id number]): At least 1 percent but less than 20 percent impaired, limited or  restricted Swallow Goal Status [id number]): At least 1 percent but less than 20 percent impaired, limited or restricted Swallow Discharge Status [phone]): At least 1 percent but less than 20 percent impaired, limited or restricted   Luanna Salk, [place] [facility] SLP [phone]

## 2015-09-27 NOTE — H&P (Signed)
History and Physical    Jacob [name] [id number] DOB: 1966/03/19 DOA: 09/26/2015  Referring MD/NP/PA:   PCP: Cathlean Cower, MD   Patient coming from:  The patient is coming from home.  At baseline, pt is independent for his ADL.  Chief Complaint: syncope and fall  HPI: Jacob [name] is a 50 y.o. male with medical history significant of hypertension, hyperlipidemia, diabetes mellitus, GERD, anxiety, who presents with syncope and fall.  Per report, pt passed out when he was eating dinner, and fell out of his chair at home. Family states he "was out" for a few seconds. He had injured his right temple on kitchen floor. After the event, pt was unable to talk. All he could do was mumbling. When I saw pt in ED, he is alert and oriented x 3. He states that he worked outside all day in hot and felt dehydrated. He states that he had similar episode 2 years ago due to working outside for long time and dehydration. Currently, he does not have unilateral weakness, numbness in his extremities, no vision change or hearing loss. No facial droop or slurred speech. He has nausea, but no vomiting, diarrhea or abdominal pain. Denies chest pain, shortness, cough, fever, chills. No history of seizure.  ED Course: pt was found to have a negative urinalysis, WBC 6.0, temperature normal, bradycardia, electrolytes and renal function okay, negative CXR, negative CT head for acute intracranial abnormalities. Patient is placed on telemetry bed for observation. Neurology was consulted.  Review of Systems:   General: no fevers, chills, no changes in body weight, has fatigue HEENT: no blurry vision, hearing changes or sore throat Pulm: no dyspnea, coughing, wheezing CV: no chest pain, no palpitations Abd: no nausea, vomiting, abdominal pain, diarrhea, constipation GU: no dysuria, burning on urination, increased urinary frequency, hematuria  Ext: no leg edema Neuro: no unilateral weakness, numbness, or tingling, no  vision change or hearing loss. Had syncope and difficult speaking. Skin: no rash MSK: No muscle spasm, no deformity, no limitation of range of movement in spin Heme: No easy bruising.  Travel history: No recent long distant travel.  Allergy: No Known Allergies  Past Medical History  Diagnosis Date  . Cholesteatoma of right ear   . Anxiety   . GERD (gastroesophageal reflux disease)   . Allergy   . Diabetes mellitus ([place])     bordreline  . Hypertension   . Hyperlipidemia 10/18/2014    Past Surgical History  Procedure Laterality Date  . Lumbar disc surgery    . Inner ear surgery      right  . Cholecystectomy    . Tubes in bil ears    . Hypospadias correction N/A 1978    Social History:  reports that he has never smoked. He has never used smokeless tobacco. He reports that he drinks about 3.0 oz of alcohol per week. He reports that he does not use illicit drugs.  Family History:  Family History  Problem Relation Age of Onset  . COPD Mother   . Hypertension Mother   . Heart disease Father   . Lung cancer Maternal Uncle   . Diabetes Paternal Grandmother   . Diabetes Maternal Grandmother   . Colon cancer Neg Hx   . Esophageal cancer Neg Hx   . Rectal cancer Neg Hx   . Stomach cancer Neg Hx      Prior to Admission medications   Medication Sig Start Date End Date Taking? Authorizing Provider  ALPRAZolam (XANAX) 0.5 MG tablet TAKE ONE TO TWO TABLETS BY MOUTH ONCE DAILY AT BEDTIME AS NEEDED 06/05/15  Yes Biagio Borg, MD  aspirin EC 81 MG tablet Take 1 tablet (81 mg total) by mouth daily. 05/09/14  Yes Biagio Borg, MD  Diclofenac Sodium 2 % SOLN Apply 1 pump twice daily. 01/23/15  Yes Biagio Borg, MD  metFORMIN (GLUCOPHAGE-XR) 500 MG 24 hr tablet 2 tabs by mouth per day 10/18/14  Yes Biagio Borg, MD  Multiple Vitamin (MULTIVITAMIN WITH MINERALS) TABS tablet Take 1 tablet by mouth daily.   Yes Historical Provider, MD  Omega-3 Fatty Acids (FISH OIL PO) Take 1 capsule by mouth  daily.   Yes Historical Provider, MD  ramipril (ALTACE) 10 MG capsule Take 1 capsule (10 mg total) by mouth daily. 05/20/15  Yes Biagio Borg, MD  tadalafil (CIALIS) 20 MG tablet Take 1 tablet (20 mg total) by mouth daily as needed for erectile dysfunction. 10/17/14  Yes Biagio Borg, MD  HYDROcodone-acetaminophen (NORCO) 7.5-325 MG tablet Take 1 tablet by mouth 2 (two) times daily as needed. Patient not taking: Reported on 09/26/2015 08/29/15   Biagio Borg, MD  naproxen (NAPROSYN) 500 MG tablet Take 1 tablet (500 mg total) by mouth 2 (two) times daily as needed. Patient not taking: Reported on 09/26/2015 05/09/14   Biagio Borg, MD  sildenafil (REVATIO) 20 MG tablet Use as directed Patient not taking: Reported on 09/26/2015 06/05/15   Biagio Borg, MD    Physical Exam: Filed Vitals:   09/27/15 0005 09/27/15 0409 09/27/15 0411 09/27/15 0445  BP: 146/88  146/89 137/82  Pulse: 60  63 48  Temp:  97.8 F (36.6 C) 97.8 F (36.6 C) 98.3 F (36.8 C)  TempSrc:   Oral Oral  Resp: 18  18 18   Height:    5\' 11"  (1.803 m)  Weight:    87.907 kg (193 lb 12.8 oz)  SpO2: 100%  100% 99%   General: Not in acute distress. Dry mucus and membrane. HEENT:       Eyes: PERRL, EOMI, no scleral icterus.       ENT: No discharge from the ears and nose, no pharynx injection, no tonsillar enlargement.        Neck: No JVD, no bruit, no mass felt. Heme: No neck lymph node enlargement. Cardiac: S1/S2, RRR, No murmurs, No gallops or rubs. Pulm: No rales, wheezing, rhonchi or rubs. Abd: Soft, nondistended, nontender, no rebound pain, no organomegaly, BS present. GU: No hematuria Ext: No pitting leg edema bilaterally. 2+DP/PT pulse bilaterally. Musculoskeletal: No joint deformities, No joint redness or warmth, no limitation of ROM in spin. Skin: No rashes.  Neuro: Alert, oriented X3, cranial nerves II-XII grossly intact, moves all extremities normally. Muscle strength 5/5 in all extremities, sensation to light touch  intact. Brachial reflex 2+ bilaterally. Negative Babinski's sign. Normal finger to nose test. Psych: Patient is not psychotic, no suicidal or hemocidal ideation.  Labs on Admission: I have personally reviewed following labs and imaging studies  CBC:  Recent Labs Lab 09/26/15 2148  WBC 6.0  HGB 15.0  HCT 42.3  MCV 83.1  PLT 0000000   Basic Metabolic Panel:  Recent Labs Lab 09/26/15 2148  NA 137  K 3.9  CL 106  CO2 24  GLUCOSE 177*  BUN 15  CREATININE 1.10  CALCIUM 9.1   GFR: Estimated Creatinine Clearance: 87.5 mL/min (by C-G formula based on Cr of 1.1). Liver  Function Tests: No results for input(s): AST, ALT, ALKPHOS, BILITOT, PROT, ALBUMIN in the last 168 hours. No results for input(s): LIPASE, AMYLASE in the last 168 hours. No results for input(s): AMMONIA in the last 168 hours. Coagulation Profile: No results for input(s): INR, PROTIME in the last 168 hours. Cardiac Enzymes: No results for input(s): CKTOTAL, CKMB, CKMBINDEX, TROPONINI in the last 168 hours. BNP (last 3 results) No results for input(s): PROBNP in the last 8760 hours. HbA1C: No results for input(s): HGBA1C in the last 72 hours. CBG:  Recent Labs Lab 09/26/15 2358 09/27/15 0418  GLUCAP 137* 110*   Lipid Profile: No results for input(s): CHOL, HDL, LDLCALC, TRIG, CHOLHDL, LDLDIRECT in the last 72 hours. Thyroid Function Tests: No results for input(s): TSH, T4TOTAL, FREET4, T3FREE, THYROIDAB in the last 72 hours. Anemia Panel: No results for input(s): VITAMINB12, FOLATE, FERRITIN, TIBC, IRON, RETICCTPCT in the last 72 hours. Urine analysis:    Component Value Date/Time   COLORURINE YELLOW 09/26/2015 2350   APPEARANCEUR CLEAR 09/26/2015 2350   LABSPEC 1.011 09/26/2015 2350   PHURINE 7.0 09/26/2015 2350   GLUCOSEU NEGATIVE 09/26/2015 2350   GLUCOSEU NEGATIVE 08/29/2015 1727   HGBUR NEGATIVE 09/26/2015 2350   BILIRUBINUR NEGATIVE 09/26/2015 2350   KETONESUR NEGATIVE 09/26/2015 2350    PROTEINUR NEGATIVE 09/26/2015 2350   UROBILINOGEN 1.0 08/29/2015 1727   NITRITE NEGATIVE 09/26/2015 2350   LEUKOCYTESUR NEGATIVE 09/26/2015 2350   Sepsis Labs: @LABRCNTIP (procalcitonin:4,lacticidven:4) )No results found for this or any previous visit (from the past 240 hour(s)).   Radiological Exams on Admission: Dg Chest 2 View  09/27/2015  CLINICAL DATA:  Syncope.  Intermittent dyspnea. EXAM: CHEST  2 VIEW COMPARISON:  10/07/2014 FINDINGS: The heart size and mediastinal contours are within normal limits. Both lungs are clear. The visualized skeletal structures are unremarkable. IMPRESSION: No active cardiopulmonary disease. Electronically Signed   By: Andreas Newport M.D.   On: 09/27/2015 01:41   Ct Head Wo Contrast  09/27/2015  CLINICAL DATA:  50 year old male with syncope EXAM: CT HEAD WITHOUT CONTRAST TECHNIQUE: Contiguous axial images were obtained from the base of the skull through the vertex without intravenous contrast. COMPARISON:  Head CT dated 12/24/2011 FINDINGS: The ventricles and the sulci are appropriate in size for the patient's age. There is no intracranial hemorrhage. No midline shift or mass effect identified. The gray-white matter differentiation is preserved. The visualized paranasal sinuses and mastoid air cells are well aerated. No acute calvarial pathology. IMPRESSION: No acute intracranial pathology. Electronically Signed   By: Anner Crete M.D.   On: 09/27/2015 01:40     EKG: Independently reviewed. Sinus rhythm, bradycardia, QTC 426, no ischemic change.  Assessment/Plan Principal Problem:   Syncope Active Problems:   Diabetes mellitus without complication (HCC)   Essential hypertension, benign   Low back pain syndrome   DJD (degenerative joint disease)   Hyperlipidemia   Syncope: Etiology is not clear. Differential diagnosis includes TIA, vasovagal syncope, orthostatic status, cardiac arrhythmia, drug abuse and seizure. CT head is negative for acute  intracranial abnormalities. No sensory infection. Neurology, dr. Nicole Kindred was consulted.  - will place on tele bed for obs - Neurology was consulted by EDP, will follow up recommendations. - Risk factor modification: HgbA1c, fasting lipid panel - MRI, MRA of the brain without contrast  - PT consult, OT consult - Bedside swallowing screen was ordered, will get speech consult in AM - 2 d Echocardiogram  - Ekg was done - Carotid dopplers  - Aspirin -  check UDS - EEG -check ortho static vital signs -IVF: 1L NS and then 125 cc/h  DM-II: Last A1c 7.2 ounces/1/17, fairly controled. Patient is taking metformin at home -SSI -Check A1c  Hypertension: -Ramipril  Low back pain syndrome and DJD (degenerative joint disease): -Voltaren gel prn  Hyperlipidemia: last LDL was 169 on 08/29/15.  -continue fish oil -f/u FLP   DVT ppx: SQ Lovenox Code Status: Full code Family Communication: None at bed side.  Disposition Plan:  Anticipate discharge back to previous home environment Consults called:  Neuro, Dr. Nicole Kindred Admission status: Obs / tele   Date of Service 09/27/2015    Ivor Costa [facility] Pager [phone]  If 7PM-7AM, please contact night-coverage [email or web address] Password [email or web address] 09/27/2015, 5:34 AM

## 2015-09-27 NOTE — Consult Note (Signed)
Requesting Physician: Dr. Algis Liming    Chief Complaint: TIA  History obtained from:  Patient   Chart   HPI:                                                                                                                                         Jacob [name] is an 50 y.o. male with medical history significant of hypertension, hyperlipidemia, diabetes mellitus, GERD, anxiety, who presents with syncope and fall.  Per report, pt passed out when he was eating dinner, and fell out of his chair at home. Family states he "was out" for a few seconds. He had injured his right temple on kitchen floor. After the event, pt was unable to talk. All he could do was mumbling. Orthostatics in hospital were negative. patient states he has had a similar episode about 2 years ago when he was cutting down a tree and dehydrated. HE suddenly had a syncopal episode and regained awarness quickly. HE states he has over worked himself in the past few weeks. He was very tired and weak. HE went in and had his lunch then suddenly lost consciousness and regained awareness quickly.  Currently he is back to his baseline.    Past Medical History  Diagnosis Date  . Cholesteatoma of right ear   . Anxiety   . GERD (gastroesophageal reflux disease)   . Allergy   . Diabetes mellitus ([place])     bordreline  . Hypertension   . Hyperlipidemia 10/18/2014    Past Surgical History  Procedure Laterality Date  . Lumbar disc surgery    . Inner ear surgery      right  . Cholecystectomy    . Tubes in bil ears    . Hypospadias correction N/A 1978    Family History  Problem Relation Age of Onset  . COPD Mother   . Hypertension Mother   . Heart disease Father   . Lung cancer Maternal Uncle   . Diabetes Paternal Grandmother   . Diabetes Maternal Grandmother   . Colon cancer Neg Hx   . Esophageal cancer Neg Hx   . Rectal cancer Neg Hx   . Stomach cancer Neg Hx    Social History:  reports that he has never smoked. He has never  used smokeless tobacco. He reports that he drinks about 3.0 oz of alcohol per week. He reports that he does not use illicit drugs.  Allergies: No Known Allergies  Medications:  Prior to Admission:  Prescriptions prior to admission  Medication Sig Dispense Refill Last Dose  . ALPRAZolam (XANAX) 0.5 MG tablet TAKE ONE TO TWO TABLETS BY MOUTH ONCE DAILY AT BEDTIME AS NEEDED 60 tablet 5 09/25/2015 at Unknown time  . aspirin EC 81 MG tablet Take 1 tablet (81 mg total) by mouth daily. 90 tablet 11 09/26/2015 at 1930  . Diclofenac Sodium 2 % SOLN Apply 1 pump twice daily. 112 g 3 09/25/2015 at Unknown time  . metFORMIN (GLUCOPHAGE-XR) 500 MG 24 hr tablet 2 tabs by mouth per day 180 tablet 3 09/25/2015 at Unknown time  . Multiple Vitamin (MULTIVITAMIN WITH MINERALS) TABS tablet Take 1 tablet by mouth daily.   Past Week at Unknown time  . Omega-3 Fatty Acids (FISH OIL PO) Take 1 capsule by mouth daily.   09/25/2015 at Unknown time  . ramipril (ALTACE) 10 MG capsule Take 1 capsule (10 mg total) by mouth daily. 90 capsule 1 09/25/2015 at Unknown time  . tadalafil (CIALIS) 20 MG tablet Take 1 tablet (20 mg total) by mouth daily as needed for erectile dysfunction. 10 tablet 11 Past Month at Unknown time  . HYDROcodone-acetaminophen (NORCO) 7.5-325 MG tablet Take 1 tablet by mouth 2 (two) times daily as needed. (Patient not taking: Reported on 09/26/2015) 60 tablet 0   . naproxen (NAPROSYN) 500 MG tablet Take 1 tablet (500 mg total) by mouth 2 (two) times daily as needed. (Patient not taking: Reported on 09/26/2015) 60 tablet 5 Taking  . sildenafil (REVATIO) 20 MG tablet Use as directed (Patient not taking: Reported on 09/26/2015) 50 tablet 2 Taking   Scheduled: . aspirin  300 mg Rectal Daily   Or  . aspirin  325 mg Oral Daily  . enoxaparin (LOVENOX) injection  40 mg Subcutaneous Daily  .  insulin aspart  0-5 Units Subcutaneous QHS  . insulin aspart  0-9 Units Subcutaneous TID WC  . LORazepam  1 mg Intravenous Once  . multivitamin with minerals  1 tablet Oral Daily  . omega-3 acid ethyl esters  1 g Oral Daily  . ramipril  10 mg Oral Daily    ROS:                                                                                                                                       History obtained from the patient  General ROS: negative for - chills, fatigue, fever, night sweats, weight gain or weight loss Psychological ROS: negative for - behavioral disorder, hallucinations, memory difficulties, mood swings or suicidal ideation Ophthalmic ROS: negative for - blurry vision, double vision, eye pain or loss of vision ENT ROS: negative for - epistaxis, nasal discharge, oral lesions, sore throat, tinnitus or vertigo Allergy and Immunology ROS: negative for - hives or itchy/watery eyes Hematological and Lymphatic ROS: negative for - bleeding problems, bruising or swollen lymph nodes Endocrine ROS: negative for -  galactorrhea, hair pattern changes, polydipsia/polyuria or temperature intolerance Respiratory ROS: negative for - cough, hemoptysis, shortness of breath or wheezing Cardiovascular ROS: negative for - chest pain, dyspnea on exertion, edema or irregular heartbeat Gastrointestinal ROS: negative for - abdominal pain, diarrhea, hematemesis, nausea/vomiting or stool incontinence Genito-Urinary ROS: negative for - dysuria, hematuria, incontinence or urinary frequency/urgency Musculoskeletal ROS: negative for - joint swelling or muscular weakness Neurological ROS: as noted in HPI Dermatological ROS: negative for rash and skin lesion changes  Neurologic Examination:                                                                                                      Blood pressure 132/75, pulse 52, temperature 98.2 F (36.8 C), temperature source Oral, resp. rate 18, height 5\' 11"   (1.803 m), weight 87.907 kg (193 lb 12.8 oz), SpO2 100 %.  HEENT-  Normocephalic, no lesions, without obvious abnormality.  Normal external eye and conjunctiva.  Normal TM's bilaterally.  Normal auditory canals and external ears. Normal external nose, mucus membranes and septum.  Normal pharynx. Cardiovascular- S1, S2 normal, pulses palpable throughout   Lungs- chest clear, no wheezing, rales, normal symmetric air entry Abdomen- normal findings: bowel sounds normal Extremities- no edema Lymph-no adenopathy palpable Musculoskeletal-no joint tenderness, deformity or swelling Skin-warm and dry, no hyperpigmentation, vitiligo, or suspicious lesions  Neurological Examination Mental Status: Alert, oriented, thought content appropriate.  Speech fluent without evidence of aphasia.  Able to follow 3 step commands without difficulty. Cranial Nerves: II: Discs flat bilaterally; Visual fields grossly normal, pupils equal, round, reactive to light and accommodation [name],IV, VI: ptosis not present, extra-ocular motions intact bilaterally V,VII: smile symmetric, facial light touch sensation normal bilaterally VIII: hearing normal bilaterally IX,X: uvula rises symmetrically XI: bilateral shoulder shrug XII: midline tongue extension Motor: Right : Upper extremity   5/5    Left:     Upper extremity   5/5  Lower extremity   5/5     Lower extremity   5/5 Tone and bulk:normal tone throughout; no atrophy noted Sensory: Pinprick and light touch intact throughout, bilaterally Deep Tendon Reflexes: 2+ and symmetric throughout Plantars: Right: downgoing   Left: downgoing Cerebellar: normal finger-to-nose, normal rapid alternating movements and normal heel-to-shin test Gait: not tested       Lab Results: Basic Metabolic Panel:  Recent Labs Lab 09/26/15 2148  NA 137  K 3.9  CL 106  CO2 24  GLUCOSE 177*  BUN 15  CREATININE 1.10  CALCIUM 9.1    Liver Function Tests: No results for input(s):  AST, ALT, ALKPHOS, BILITOT, PROT, ALBUMIN in the last 168 hours. No results for input(s): LIPASE, AMYLASE in the last 168 hours. No results for input(s): AMMONIA in the last 168 hours.  CBC:  Recent Labs Lab 09/26/15 2148  WBC 6.0  HGB 15.0  HCT 42.3  MCV 83.1  PLT 193    Cardiac Enzymes: No results for input(s): CKTOTAL, CKMB, CKMBINDEX, TROPONINI in the last 168 hours.  Lipid Panel: No results for input(s): CHOL, TRIG, HDL, CHOLHDL, VLDL, LDLCALC in the last  168 hours.  CBG:  Recent Labs Lab 09/26/15 2358 09/27/15 0418 09/27/15 0628 09/27/15 1159  GLUCAP 137* 110* 136* 128*    Microbiology: Results for orders placed or performed during the hospital encounter of 08/31/06  Rapid strep screen     Status: None   Collection Time: 08/31/06 12:49 PM  Result Value Ref Range Status   Streptococcus, Group A Screen (Direct) NEGATIVE  Final  Strep A DNA probe     Status: None   Collection Time: 08/31/06 12:49 PM  Result Value Ref Range Status   Specimen Description THROAT  Final   Special Requests NONE  Final   Group A Strep Probe NEGATIVE  Final   Report Status 09/01/2006 FINAL  Final    Coagulation Studies: No results for input(s): LABPROT, INR in the last 72 hours.  Imaging: Dg Eye Foreign Body  09/27/2015  CLINICAL DATA:  Metal working/exposure; clearance prior to MRI EXAM: ORBITS FOR FOREIGN BODY - 2 VIEW COMPARISON:  Head CT 09/27/2015 FINDINGS: There is no evidence of metallic foreign body within the orbits. There is a 1 mm punctate metallic density which projects inferior and lateral to the left orbit and corresponds to a superficial foreign body on the earlier head CT. IMPRESSION: No evidence of metallic foreign body within the orbits. Punctate superficial foreign body inferior and lateral to the left orbit. Patient is considered safe to proceed with MRI with standard precautions. Electronically Signed   By: Logan Bores M.D.   On: 09/27/2015 11:18   Dg Chest 2  View  09/27/2015  CLINICAL DATA:  Syncope.  Intermittent dyspnea. EXAM: CHEST  2 VIEW COMPARISON:  10/07/2014 FINDINGS: The heart size and mediastinal contours are within normal limits. Both lungs are clear. The visualized skeletal structures are unremarkable. IMPRESSION: No active cardiopulmonary disease. Electronically Signed   By: Andreas Newport M.D.   On: 09/27/2015 01:41   Ct Head Wo Contrast  09/27/2015  CLINICAL DATA:  50 year old male with syncope EXAM: CT HEAD WITHOUT CONTRAST TECHNIQUE: Contiguous axial images were obtained from the base of the skull through the vertex without intravenous contrast. COMPARISON:  Head CT dated 12/24/2011 FINDINGS: The ventricles and the sulci are appropriate in size for the patient's age. There is no intracranial hemorrhage. No midline shift or mass effect identified. The gray-white matter differentiation is preserved. The visualized paranasal sinuses and mastoid air cells are well aerated. No acute calvarial pathology. IMPRESSION: No acute intracranial pathology. Electronically Signed   By: Anner Crete M.D.   On: 09/27/2015 01:40   Mr Brain Wo Contrast  09/27/2015  CLINICAL DATA:  Syncopal episode with fall, which occurred after working outside for a long time with subsequent dehydration. No reported neurologic deficit. Stroke risk factors include diabetes, hypertension, and hyperlipidemia. EXAM: MRI HEAD WITHOUT CONTRAST MRA HEAD WITHOUT CONTRAST TECHNIQUE: Multiplanar, multiecho pulse sequences of the brain and surrounding structures were obtained without intravenous contrast. Angiographic images of the head were obtained using MRA technique without contrast. COMPARISON:  CT head 09/27/2015. FINDINGS: MRI HEAD FINDINGS No evidence for acute infarction, hemorrhage, mass lesion, hydrocephalus, or extra-axial fluid. Normal cerebral volume. No significant white matter disease. Pituitary, pineal, and cerebellar tonsils unremarkable. No upper cervical lesions.  Flow voids are maintained throughout the carotid, basilar, and vertebral arteries. There are no areas of chronic hemorrhage. Visualized calvarium, skull base, and upper cervical osseous structures unremarkable. Scalp and extracranial soft tissues, orbits, sinuses, and mastoids show no acute process. Good general agreement with prior CT.  MRA HEAD FINDINGS The internal carotid arteries are widely patent. The basilar artery is widely patent with vertebrals contributing nearly equally, RIGHT slightly larger. Fetal LEFT PCA. No intracranial stenosis or aneurysm. Early bifurcation RIGHT MCA, normal variant. IMPRESSION: Negative exam.  No cause is seen for syncope. No acute intracranial findings are evident. Electronically Signed   By: Staci Righter M.D.   On: 09/27/2015 12:01   Mr Jodene Nam Head/brain Wo Cm  09/27/2015  CLINICAL DATA:  Syncopal episode with fall, which occurred after working outside for a long time with subsequent dehydration. No reported neurologic deficit. Stroke risk factors include diabetes, hypertension, and hyperlipidemia. EXAM: MRI HEAD WITHOUT CONTRAST MRA HEAD WITHOUT CONTRAST TECHNIQUE: Multiplanar, multiecho pulse sequences of the brain and surrounding structures were obtained without intravenous contrast. Angiographic images of the head were obtained using MRA technique without contrast. COMPARISON:  CT head 09/27/2015. FINDINGS: MRI HEAD FINDINGS No evidence for acute infarction, hemorrhage, mass lesion, hydrocephalus, or extra-axial fluid. Normal cerebral volume. No significant white matter disease. Pituitary, pineal, and cerebellar tonsils unremarkable. No upper cervical lesions. Flow voids are maintained throughout the carotid, basilar, and vertebral arteries. There are no areas of chronic hemorrhage. Visualized calvarium, skull base, and upper cervical osseous structures unremarkable. Scalp and extracranial soft tissues, orbits, sinuses, and mastoids show no acute process. Good general  agreement with prior CT. MRA HEAD FINDINGS The internal carotid arteries are widely patent. The basilar artery is widely patent with vertebrals contributing nearly equally, RIGHT slightly larger. Fetal LEFT PCA. No intracranial stenosis or aneurysm. Early bifurcation RIGHT MCA, normal variant. IMPRESSION: Negative exam.  No cause is seen for syncope. No acute intracranial findings are evident. Electronically Signed   By: Staci Righter M.D.   On: 09/27/2015 12:01       Assessment and plan discussed with with attending physician and they are in agreement.    Etta Quill PA-C [facility] [phone]  09/27/2015, 12:55 PM   Assessment: 50 y.o. male with syncopal episode in setting of bradycardia. MRI brain is negative for intracranial pathology and exam is non-focal. EEG is negative.  Stroke Risk Factors - diabetes mellitus, hyperlipidemia and hypertension  Recommend: 1) out patient Holter moniter 2) Neurology will S/O.

## 2015-09-27 NOTE — Progress Notes (Signed)
PROGRESS NOTE  Jacob [name]  [id number] DOB: 13-Apr-1965  DOA: 09/26/2015 PCP: Cathlean Cower, MD   Brief Narrative:  50 year old male, PMH of HTN, HLD, DM, GERD, anxiety presented to ED after an episode of syncope and fall. 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 and has not been adequately hydrating himself and has been drinking sodas. He apparently finished eating dinner following which she passed out and fell off the chair. Denies injuries. Claims to have had a similar episode 2 years ago when working outside for a long time associated with dehydration. Admitted for evaluation and management of syncope.   Assessment & Plan:   Principal Problem:   Syncope Active Problems:   Diabetes mellitus without complication (HCC)   Essential hypertension, benign   Low back pain syndrome   DJD (degenerative joint disease)   Hyperlipidemia   Syncope and collapse - May have been related to dehydration and exhaustion. - Negative orthostatic blood pressures in ED. - Telemetry shows sinus bradycardia in the 50s without arrhythmias. - CT head without acute findings. MRI brain: Negative exam. - EEG: Normal EEG in the awake state. - UDS: Negative. - 2-D echo and carotid Dopplers are to be done. - Recommended IV fluids but patient declines and states that he will drink as much as possible. - Neurology consultation appreciated. Recommend outpatient Holter monitor. - Patient has been counseled that he should not drive for 6 months.  DM 2 - Reasonable inpatient control on SSI. Follow-up A1c.  Essential hypertension - Controlled on ramipril.  Hyperlipidemia - Continue home medications.    DVT prophylaxis: Lovenox Code Status: Full  Family Communication: Discussed with patient. No family at bedside.  Disposition Plan: DC home possibly 7/1   Consultants:   Neurology  Procedures:    None  Antimicrobials:   None    Subjective: States that overall he has felt tired and exhausted for the last 1 week but starting to feel better. Has not had enough sleep. Denies chest pain, palpitations, headache or dizziness.   Objective:  Filed Vitals:   09/27/15 0445 09/27/15 0645 09/27/15 1235 09/27/15 1717  BP: 137/82 124/75 132/75 134/84  Pulse: 48 49 52 56  Temp: 98.3 F (36.8 C) 98.1 F (36.7 C) 98.2 F (36.8 C) 98.6 F (37 C)  TempSrc: Oral Oral Oral Oral  Resp: 18 18 18 18   Height: 5\' 11"  (1.803 m)     Weight: 87.907 kg (193 lb 12.8 oz)     SpO2: 99% 99% 100% 100%   No intake or output data in the 24 hours ending 09/27/15 1738 Filed Weights   09/27/15 0445  Weight: 87.907 kg (193 lb 12.8 oz)    Examination:  General exam: Pleasant young male lying comfortably supine in bed.  Respiratory system: Clear to auscultation. Respiratory effort normal. Cardiovascular system: S1 & S2 heard, RRR. No JVD, murmurs, rubs, gallops or clicks. No pedal edema. telemetry: Sinus bradycardia in the 50s.  Gastrointestinal system: Abdomen is nondistended, soft and nontender. No organomegaly or masses felt. Normal bowel sounds heard. Central nervous system: Alert and oriented. No focal neurological deficits. Extremities: Symmetric 5 x 5 power. Skin: No rashes, lesions or ulcers Psychiatry: Judgement and insight appear normal. Mood & affect appropriate.     Data Reviewed: I have personally reviewed following labs and imaging studies  CBC:  Recent Labs Lab 09/26/15 2148  WBC 6.0  HGB 15.0  HCT 42.3  MCV 83.1  PLT 0000000   Basic Metabolic Panel:  Recent Labs Lab 09/26/15 2148  NA 137  K 3.9  CL 106  CO2 24  GLUCOSE 177*  BUN 15  CREATININE 1.10  CALCIUM 9.1   GFR: Estimated Creatinine Clearance: 87.5 mL/min (by C-G formula based on Cr of 1.1). Liver Function Tests: No results for input(s): AST, ALT, ALKPHOS, BILITOT, PROT, ALBUMIN in the last 168 hours. No  results for input(s): LIPASE, AMYLASE in the last 168 hours. No results for input(s): AMMONIA in the last 168 hours. Coagulation Profile: No results for input(s): INR, PROTIME in the last 168 hours. Cardiac Enzymes: No results for input(s): CKTOTAL, CKMB, CKMBINDEX, TROPONINI in the last 168 hours. BNP (last 3 results) No results for input(s): PROBNP in the last 8760 hours. HbA1C: No results for input(s): HGBA1C in the last 72 hours. CBG:  Recent Labs Lab 09/26/15 2358 09/27/15 0418 09/27/15 0628 09/27/15 1159 09/27/15 1616  GLUCAP 137* 110* 136* 128* 155*   Lipid Profile: No results for input(s): CHOL, HDL, LDLCALC, TRIG, CHOLHDL, LDLDIRECT in the last 72 hours. Thyroid Function Tests: No results for input(s): TSH, T4TOTAL, FREET4, T3FREE, THYROIDAB in the last 72 hours. Anemia Panel: No results for input(s): VITAMINB12, FOLATE, FERRITIN, TIBC, IRON, RETICCTPCT in the last 72 hours.  Sepsis Labs: No results for input(s): PROCALCITON, LATICACIDVEN in the last 168 hours.  No results found for this or any previous visit (from the past 240 hour(s)).       Radiology Studies: Dg Eye Foreign Body  09/27/2015  CLINICAL DATA:  Metal working/exposure; clearance prior to MRI EXAM: ORBITS FOR FOREIGN BODY - 2 VIEW COMPARISON:  Head CT 09/27/2015 FINDINGS: There is no evidence of metallic foreign body within the orbits. There is a 1 mm punctate metallic density which projects inferior and lateral to the left orbit and corresponds to a superficial foreign body on the earlier head CT. IMPRESSION: No evidence of metallic foreign body within the orbits. Punctate superficial foreign body inferior and lateral to the left orbit. Patient is considered safe to proceed with MRI with standard precautions. Electronically Signed   By: Logan Bores M.D.   On: 09/27/2015 11:18   Dg Chest 2 View  09/27/2015  CLINICAL DATA:  Syncope.  Intermittent dyspnea. EXAM: CHEST  2 VIEW COMPARISON:  10/07/2014  FINDINGS: The heart size and mediastinal contours are within normal limits. Both lungs are clear. The visualized skeletal structures are unremarkable. IMPRESSION: No active cardiopulmonary disease. Electronically Signed   By: Andreas Newport M.D.   On: 09/27/2015 01:41   Ct Head Wo Contrast  09/27/2015  CLINICAL DATA:  50 year old male with syncope EXAM: CT HEAD WITHOUT CONTRAST TECHNIQUE: Contiguous axial images were obtained from the base of the skull through the vertex without intravenous contrast. COMPARISON:  Head CT dated 12/24/2011 FINDINGS: The ventricles and the sulci are appropriate in size for the patient's age. There is no intracranial hemorrhage. No midline shift or mass effect identified. The gray-white matter differentiation is preserved. The visualized paranasal sinuses and mastoid air cells are well aerated. No acute calvarial pathology. IMPRESSION: No acute intracranial pathology. Electronically Signed   By: Anner Crete M.D.   On: 09/27/2015 01:40   Mr Brain Wo Contrast  09/27/2015  CLINICAL DATA:  Syncopal episode with fall, which occurred after working outside for a long time with subsequent dehydration. No reported neurologic deficit. Stroke risk factors include diabetes, hypertension, and hyperlipidemia. EXAM:  MRI HEAD WITHOUT CONTRAST MRA HEAD WITHOUT CONTRAST TECHNIQUE: Multiplanar, multiecho pulse sequences of the brain and surrounding structures were obtained without intravenous contrast. Angiographic images of the head were obtained using MRA technique without contrast. COMPARISON:  CT head 09/27/2015. FINDINGS: MRI HEAD FINDINGS No evidence for acute infarction, hemorrhage, mass lesion, hydrocephalus, or extra-axial fluid. Normal cerebral volume. No significant white matter disease. Pituitary, pineal, and cerebellar tonsils unremarkable. No upper cervical lesions. Flow voids are maintained throughout the carotid, basilar, and vertebral arteries. There are no areas of chronic  hemorrhage. Visualized calvarium, skull base, and upper cervical osseous structures unremarkable. Scalp and extracranial soft tissues, orbits, sinuses, and mastoids show no acute process. Good general agreement with prior CT. MRA HEAD FINDINGS The internal carotid arteries are widely patent. The basilar artery is widely patent with vertebrals contributing nearly equally, RIGHT slightly larger. Fetal LEFT PCA. No intracranial stenosis or aneurysm. Early bifurcation RIGHT MCA, normal variant. IMPRESSION: Negative exam.  No cause is seen for syncope. No acute intracranial findings are evident. Electronically Signed   By: Staci Righter M.D.   On: 09/27/2015 12:01   Mr Jodene Nam Head/brain Wo Cm  09/27/2015  CLINICAL DATA:  Syncopal episode with fall, which occurred after working outside for a long time with subsequent dehydration. No reported neurologic deficit. Stroke risk factors include diabetes, hypertension, and hyperlipidemia. EXAM: MRI HEAD WITHOUT CONTRAST MRA HEAD WITHOUT CONTRAST TECHNIQUE: Multiplanar, multiecho pulse sequences of the brain and surrounding structures were obtained without intravenous contrast. Angiographic images of the head were obtained using MRA technique without contrast. COMPARISON:  CT head 09/27/2015. FINDINGS: MRI HEAD FINDINGS No evidence for acute infarction, hemorrhage, mass lesion, hydrocephalus, or extra-axial fluid. Normal cerebral volume. No significant white matter disease. Pituitary, pineal, and cerebellar tonsils unremarkable. No upper cervical lesions. Flow voids are maintained throughout the carotid, basilar, and vertebral arteries. There are no areas of chronic hemorrhage. Visualized calvarium, skull base, and upper cervical osseous structures unremarkable. Scalp and extracranial soft tissues, orbits, sinuses, and mastoids show no acute process. Good general agreement with prior CT. MRA HEAD FINDINGS The internal carotid arteries are widely patent. The basilar artery is  widely patent with vertebrals contributing nearly equally, RIGHT slightly larger. Fetal LEFT PCA. No intracranial stenosis or aneurysm. Early bifurcation RIGHT MCA, normal variant. IMPRESSION: Negative exam.  No cause is seen for syncope. No acute intracranial findings are evident. Electronically Signed   By: Staci Righter M.D.   On: 09/27/2015 12:01        Scheduled Meds: . aspirin  300 mg Rectal Daily   Or  . aspirin  325 mg Oral Daily  . enoxaparin (LOVENOX) injection  40 mg Subcutaneous Daily  . insulin aspart  0-5 Units Subcutaneous QHS  . insulin aspart  0-9 Units Subcutaneous TID WC  . LORazepam  1 mg Intravenous Once  . multivitamin with minerals  1 tablet Oral Daily  . omega-3 acid ethyl esters  1 g Oral Daily  . ramipril  10 mg Oral Daily   Continuous Infusions: . sodium chloride    . sodium chloride 1,000 mL (09/27/15 0503)        Time spent: 35 minutes.    [facility], MD [facility] Pager [phone]  If 7PM-7AM, please contact night-coverage [email or web address] Password [facility] 09/27/2015, 5:38 PM

## 2015-09-27 NOTE — Procedures (Signed)
EEG Report  Clinical History:  Syncope  Technical Summary:  A 19 channel digital EEG recording was performed using the 10-20 international system of electrode placement.  Bipolar and Referential montages were used.  The total recording time was 20 minutes.  Findings:  There is a well developed posterior dominant rhythm of 11 Hz reactive to eye opening and closure.  Photic stimulation produced a symmetrical driving response and did not elicit any abnormalities.  Hyperventilation was not performed.  There was no focal slowing present.  There were no epileptiform discharges or electrographic seizures present.  Sleep was not recorded.    Impression:   This is a normal EEG in the awake state.  However, this does not rule out underlying epilepsy.

## 2015-09-27 NOTE — Progress Notes (Signed)
OT Cancellation Note  Patient Details Name: JOSELITO CHOUDRY MRN: [id number] DOB: 09-10-1965   Cancelled Treatment:    Reason Eval/Treat Not Completed: Patient at procedure or test/ unavailable (EEG followed by MRI )  Vonita Moss   OTR/L Pager: [phone] Office: [phone] .  09/27/2015, 10:08 AM

## 2015-09-27 NOTE — Progress Notes (Signed)
OT Cancellation Note  Patient Details Name: [name] MRN: [id number] DOB: 20-Dec-1965   Cancelled Treatment:    Reason Eval/Treat Not Completed: OT screened, no needs identified, will sign off. Per PT, pt has returned to baseline with no residual deficits. No acute OT needs identified; signing off at this time. Please re-consult if needs change. Thank you for this referral.  Binnie Kand M.S., OTR/L Pager: [phone]  09/27/2015, 2:46 PM

## 2015-09-27 NOTE — Evaluation (Signed)
Physical Therapy Evaluation Patient Details Name: Jacob [name] MRN: [id number] DOB: 08/19/1965 Today's Date: 09/27/2015   History of Present Illness  Patient is a 50 y/o male with hx of HTN, HLD, DM presents with syncope and fall. MRI and CT of head negative.   Clinical Impression  Patient tolerated gait training with higher level balance activities without LOB or difficulty. Scored 23/24 on DGI demonstrating decreased fall risk in the community. Tolerated stair training Mod I without assist. Pt is functioning at baseline and does not require skilled therapy services. All education completed. Discharge from therapy.    Follow Up Recommendations No PT follow up    Equipment Recommendations  None recommended by PT    Recommendations for Other Services       Precautions / Restrictions Precautions Precautions: None Restrictions Weight Bearing Restrictions: No      Mobility  Bed Mobility Overal bed mobility: Modified Independent             General bed mobility comments: No assist needed. HOB slightly elevated.   Transfers Overall transfer level: Independent               General transfer comment: Stood from EOB x1, from toilet x1. No assist, no dizziness.   Ambulation/Gait Ambulation/Gait assistance: Independent Ambulation Distance (Feet): 400 Feet Assistive device: None Gait Pattern/deviations: WFL(Within Functional Limits)   Gait velocity interpretation: at or above normal speed for age/gender General Gait Details: Steady, fast gait speed. Tolerated higher level balance challenges without difficulty. See balance section.  Stairs Stairs: Yes Stairs assistance: Modified independent (Device/Increase time) Stair Management: Alternating pattern;One rail Right Number of Stairs: 13 General stair comments: Cues to use rail for support.  Wheelchair Mobility    Modified Rankin (Stroke Patients Only)       Balance Overall balance assessment: Modified  Independent                           High level balance activites: Direction changes;Turns;Sudden stops;Head turns High Level Balance Comments: Tolerated above without difficulty or deviations in gait. ABle to perform 360 degree turns, changes in gait speed etc. No LOB. Standardized Balance Assessment Standardized Balance Assessment : Dynamic Gait Index   Dynamic Gait Index Level Surface: Normal Change in Gait Speed: Normal Gait with Horizontal Head Turns: Normal Gait with Vertical Head Turns: Normal Gait and Pivot Turn: Normal Step Over Obstacle: Normal (jumped over obstacle) Step Around Obstacles: Normal Steps: Mild Impairment Total Score: 23       Pertinent Vitals/Pain Pain Assessment: No/denies pain    Home Living Family/patient expects to be discharged to:: Private residence Living Arrangements: Spouse/significant other Available Help at Discharge: Family;Available PRN/intermittently Type of Home: House Home Access: Stairs to enter Entrance Stairs-Rails: Right Entrance Stairs-Number of Steps: 3 Home Layout: One level (with an attic) Home Equipment: None      Prior Function Level of Independence: Independent         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, newspaper   Dominant Hand: Right    Extremity/Trunk Assessment   Upper Extremity Assessment: Defer to OT evaluation;Overall WFL for tasks assessed           Lower Extremity Assessment: Overall WFL for tasks assessed         Communication   Communication: No difficulties  Cognition Arousal/Alertness: Awake/alert Behavior During Therapy: WFL for tasks assessed/performed Overall Cognitive Status: Within Functional Limits for  tasks assessed                      General Comments      Exercises        Assessment/Plan    PT Assessment Patent does not need any further PT services  PT Diagnosis Difficulty walking   PT Problem List    PT Treatment  Interventions     PT Goals (Current goals can be found in the Care Plan section) Acute Rehab PT Goals Patient Stated Goal: to go home PT Goal Formulation: All assessment and education complete, DC therapy    Frequency     Barriers to discharge        Co-evaluation               End of Session   Activity Tolerance: Patient tolerated treatment well Patient left: in bed;with call bell/phone within reach Nurse Communication: Mobility status    Functional Assessment Tool Used: clinical judgment Functional Limitation: Mobility: Walking and moving around Mobility: Walking and Moving Around Current Status [phone]): 0 percent impaired, limited or restricted Mobility: Walking and Moving Around Goal Status [phone]): 0 percent impaired, limited or restricted Mobility: Walking and Moving Around Discharge Status [phone]): 0 percent impaired, limited or restricted    Time: 1415-1432 PT Time Calculation (min) (ACUTE ONLY): 17 min   Charges:   PT Evaluation $PT Eval Low Complexity: 1 Procedure     PT G Codes:   PT G-Codes **NOT FOR INPATIENT CLASS** Functional Assessment Tool Used: clinical judgment Functional Limitation: Mobility: Walking and moving around Mobility: Walking and Moving Around Current Status [id number]): 0 percent impaired, limited or restricted Mobility: Walking and Moving Around Goal Status [phone]): 0 percent impaired, limited or restricted Mobility: Walking and Moving Around Discharge Status [id number]): 0 percent impaired, limited or restricted    Lott Seelbach A Alyah Boehning 09/27/2015, 3:29 PM  Wray Kearns, [place], DPT [phone]

## 2015-09-27 NOTE — ED Notes (Signed)
Jacob [name] called report care link in route

## 2015-09-27 NOTE — Progress Notes (Signed)
PT Cancellation Note  Patient Details Name: Jacob [name] MRN: [id number] DOB: Jun 25, 1965   Cancelled Treatment:    Reason Eval/Treat Not Completed: Patient not medically ready Pt on bedrest. Will await increase in activity orders prior to initiation of PT evaluation. Will follow up.   Marguarite Arbour A [name] 09/27/2015, 8:46 AM Wray Kearns, PT, DPT [phone]

## 2015-09-27 NOTE — Progress Notes (Signed)
EEG Completed; Results Pending  

## 2015-09-28 ENCOUNTER — Observation Stay (HOSPITAL_BASED_OUTPATIENT_CLINIC_OR_DEPARTMENT_OTHER): Payer: BLUE CROSS/BLUE SHIELD

## 2015-09-28 ENCOUNTER — Observation Stay (HOSPITAL_COMMUNITY): Payer: BLUE CROSS/BLUE SHIELD

## 2015-09-28 ENCOUNTER — Other Ambulatory Visit (HOSPITAL_COMMUNITY): Payer: Self-pay

## 2015-09-28 DIAGNOSIS — R55 Syncope and collapse: Secondary | ICD-10-CM

## 2015-09-28 DIAGNOSIS — E785 Hyperlipidemia, unspecified: Secondary | ICD-10-CM | POA: Diagnosis not present

## 2015-09-28 DIAGNOSIS — I10 Essential (primary) hypertension: Secondary | ICD-10-CM

## 2015-09-28 LAB — VAS US CAROTID
LCCADSYS: -98 cm/s
LCCAPDIAS: 22 cm/s
LCCAPSYS: 150 cm/s
LEFT ECA DIAS: -14 cm/s
LEFT VERTEBRAL DIAS: 17 cm/s
LICAPDIAS: -24 cm/s
Left CCA dist dias: -21 cm/s
Left ICA prox sys: -103 cm/s
RCCADSYS: -103 cm/s
RCCAPDIAS: 26 cm/s
RIGHT ECA DIAS: -17 cm/s
RIGHT VERTEBRAL DIAS: -18 cm/s
Right CCA prox sys: 133 cm/s

## 2015-09-28 LAB — ECHOCARDIOGRAM COMPLETE
CHL CUP MV DEC (S): 301
E/e' ratio: 8.34
EWDT: 301 ms
FS: 33 % (ref 28–44)
Height: 71 in
IV/PV OW: 1
LA vol: 48.8 mL
LADIAMINDEX: 1.23 cm/m2
LASIZE: 26 mm
LAVOLA4C: 44 mL
LAVOLIN: 23.1 mL/m2
LDCA: 3.8 cm2
LEFT ATRIUM END SYS DIAM: 26 mm
LV E/e' medial: 8.34
LV PW d: 8.83 mm — AB (ref 0.6–1.1)
LV TDI E'LATERAL: 11.1
LV TDI E'MEDIAL: 9.03
LVEEAVG: 8.34
LVELAT: 11.1 cm/s
LVOTD: 22 mm
MV Peak grad: 3 mmHg
MV pk A vel: 52.4 m/s
MV pk E vel: 92.6 m/s
RV LATERAL S' VELOCITY: 11.6 cm/s
TAPSE: 19.5 mm
Weight: 3100.8 oz

## 2015-09-28 LAB — GLUCOSE, CAPILLARY
GLUCOSE-CAPILLARY: 133 mg/dL — AB (ref 65–99)
Glucose-Capillary: 126 mg/dL — ABNORMAL HIGH (ref 65–99)

## 2015-09-28 NOTE — Discharge Summary (Addendum)
Physician Discharge Summary  Jacob [name] [id number] DOB: 1965-07-12  PCP: Cathlean Cower, MD  Admit date: 09/26/2015 Discharge date: 09/28/2015  Admitted From: Home Disposition:  Home  Recommendations for Outpatient Follow-up:  1. Dr. Cathlean Cower, PCP in 1 week. 2. [facility]: Office will call patient to arrange outpatient heart monitor.  Home Health: None Equipment/Devices: None    Discharge Condition: Improved and stable.  CODE STATUS: Full  Diet recommendation: Heart Healthy diet.  Brief/Interim Summary: 50 year old male, PMH of HTN, HLD, DM, GERD, anxiety presented to ED after an episode of syncope and fall. 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 and has not been adequately hydrating himself and has been drinking sodas. He apparently finished eating dinner following which she passed out and fell off the chair. Denies injuries. Claims to have had a similar episode 2 years ago when working outside for a long time associated with dehydration. Admitted for evaluation and management of syncope.  Discharge Diagnoses:  Principal Problem:   Syncope Active Problems:   Diabetes mellitus without complication (HCC)   Essential hypertension, benign   Low back pain syndrome   DJD (degenerative joint disease)   Hyperlipidemia  Syncope and collapse - May have been related to dehydration and exhaustion. - Negative orthostatic blood pressures in ED. - Telemetry showed asymptomatic sinus bradycardia in the high 40s-50s without arrhythmias. - CT head without acute findings. MRI brain: Negative exam. - EEG: Normal EEG in the awake state. - UDS: Negative. - 2-D echo: Unremarkable and results as below. - Carotid Dopplers: 1-39 percent ICA plaquing. Vertebral artery flow is antegrade. - Neurology consultation appreciated. Recommend outpatient Holter monitor >discussed with [facility] cardiology service  and they will arrange this as outpatient.. - Patient has been counseled that he should not drive for 6 months and be careful with operating machinery. He was not very happy with this recommendation but verbalized understanding.  DM 2 - Reasonable inpatient control on SSI. A1c: 7.2. Continue metformin at discharge and advised better diet and exercise for better control.  Essential hypertension - Controlled on ramipril.  Hyperlipidemia - Continue home medications.   Asymptomatic sinus bradycardia - Mostly at nights, sinus bradycardia in the high 40s - 50s. Heart rate increases with activity to sinus rhythm. TSH: 1.58 recently.   Consultants:   Neurology  Procedures:   2-D echo 09/28/15: Study Conclusions  - Left ventricle: The cavity size was normal. Systolic function was  normal. The estimated ejection fraction was in the range of 55%  to 60%. Wall motion was normal; there were no regional wall  motion abnormalities.  Discharge Instructions      Discharge Instructions    Call MD for:  extreme fatigue    Complete by:  As directed      Call MD for:  persistant dizziness or light-headedness    Complete by:  As directed      Call MD for:    Complete by:  As directed   Passing out.     Diet - low sodium heart healthy    Complete by:  As directed      Driving Restrictions    Complete by:  As directed   Do not drive for 6 months or until cleared by your out patient doctor during follow up.     Increase activity slowly    Complete by:  As directed  Medication List    TAKE these medications        ALPRAZolam 0.5 MG tablet  Commonly known as:  XANAX  TAKE ONE TO TWO TABLETS BY MOUTH ONCE DAILY AT BEDTIME AS NEEDED     aspirin EC 81 MG tablet  Take 1 tablet (81 mg total) by mouth daily.     Diclofenac Sodium 2 % Soln  Apply 1 pump twice daily.     FISH OIL PO  Take 1 capsule by mouth daily.     metFORMIN 500 MG 24 hr tablet  Commonly known as:   GLUCOPHAGE-XR  2 tabs by mouth per day     multivitamin with minerals Tabs tablet  Take 1 tablet by mouth daily.     ramipril 10 MG capsule  Commonly known as:  ALTACE  Take 1 capsule (10 mg total) by mouth daily.     tadalafil 20 MG tablet  Commonly known as:  CIALIS  Take 1 tablet (20 mg total) by mouth daily as needed for erectile dysfunction.       Follow-up Information    Follow up with Cathlean Cower, MD. Schedule an appointment as soon as possible for a visit in 1 week.   Specialties:  Internal Medicine, Radiology   Contact information:   [place] [address] [phone]       Follow up with [place].   Why:  MD's office will call and arrange for outpatient heart monitor.   Contact information:   [place] [phone]      Follow up with [facility].   Specialty:  Cardiology   Why:  our office will call and arrange a heart monitor for you to wear.  they will call Monday or Wed. call Wed if you have not heard anything.    Contact information:   [address] [place] [phone]     No Known Allergies   Procedures/Studies: Dg Eye Foreign Body  09/27/2015  CLINICAL DATA:  Metal working/exposure; clearance prior to MRI EXAM: ORBITS FOR FOREIGN BODY - 2 VIEW COMPARISON:  Head CT 09/27/2015 FINDINGS: There is no evidence of metallic foreign body within the orbits. There is a 1 mm punctate metallic density which projects inferior and lateral to the left orbit and corresponds to a superficial foreign body on the earlier head CT. IMPRESSION: No evidence of metallic foreign body within the orbits. Punctate superficial foreign body inferior and lateral to the left orbit. Patient is considered safe to proceed with MRI with standard precautions. Electronically Signed   By: Logan Bores M.D.    On: 09/27/2015 11:18   Dg Chest 2 View  09/27/2015  CLINICAL DATA:  Syncope.  Intermittent dyspnea. EXAM: CHEST  2 VIEW COMPARISON:  10/07/2014 FINDINGS: The heart size and mediastinal contours are within normal limits. Both lungs are clear. The visualized skeletal structures are unremarkable. IMPRESSION: No active cardiopulmonary disease. Electronically Signed   By: Andreas Newport M.D.   On: 09/27/2015 01:41   Ct Head Wo Contrast  09/27/2015  CLINICAL DATA:  50 year old male with syncope EXAM: CT HEAD WITHOUT CONTRAST TECHNIQUE: Contiguous axial images were obtained from the base of the skull through the vertex without intravenous contrast. COMPARISON:  Head CT dated 12/24/2011 FINDINGS: The ventricles and the sulci are appropriate in size for the patient's age. There is no intracranial  hemorrhage. No midline shift or mass effect identified. The gray-white matter differentiation is preserved. The visualized paranasal sinuses and mastoid air cells are well aerated. No acute calvarial pathology. IMPRESSION: No acute intracranial pathology. Electronically Signed   By: Anner Crete M.D.   On: 09/27/2015 01:40   Mr Brain Wo Contrast  09/27/2015  CLINICAL DATA:  Syncopal episode with fall, which occurred after working outside for a long time with subsequent dehydration. No reported neurologic deficit. Stroke risk factors include diabetes, hypertension, and hyperlipidemia. EXAM: MRI HEAD WITHOUT CONTRAST MRA HEAD WITHOUT CONTRAST TECHNIQUE: Multiplanar, multiecho pulse sequences of the brain and surrounding structures were obtained without intravenous contrast. Angiographic images of the head were obtained using MRA technique without contrast. COMPARISON:  CT head 09/27/2015. FINDINGS: MRI HEAD FINDINGS No evidence for acute infarction, hemorrhage, mass lesion, hydrocephalus, or extra-axial fluid. Normal cerebral volume. No significant white matter disease. Pituitary, pineal, and cerebellar tonsils  unremarkable. No upper cervical lesions. Flow voids are maintained throughout the carotid, basilar, and vertebral arteries. There are no areas of chronic hemorrhage. Visualized calvarium, skull base, and upper cervical osseous structures unremarkable. Scalp and extracranial soft tissues, orbits, sinuses, and mastoids show no acute process. Good general agreement with prior CT. MRA HEAD FINDINGS The internal carotid arteries are widely patent. The basilar artery is widely patent with vertebrals contributing nearly equally, RIGHT slightly larger. Fetal LEFT PCA. No intracranial stenosis or aneurysm. Early bifurcation RIGHT MCA, normal variant. IMPRESSION: Negative exam.  No cause is seen for syncope. No acute intracranial findings are evident. Electronically Signed   By: Staci Righter M.D.   On: 09/27/2015 12:01   Mr Jodene Nam Head/brain Wo Cm  09/27/2015  CLINICAL DATA:  Syncopal episode with fall, which occurred after working outside for a long time with subsequent dehydration. No reported neurologic deficit. Stroke risk factors include diabetes, hypertension, and hyperlipidemia. EXAM: MRI HEAD WITHOUT CONTRAST MRA HEAD WITHOUT CONTRAST TECHNIQUE: Multiplanar, multiecho pulse sequences of the brain and surrounding structures were obtained without intravenous contrast. Angiographic images of the head were obtained using MRA technique without contrast. COMPARISON:  CT head 09/27/2015. FINDINGS: MRI HEAD FINDINGS No evidence for acute infarction, hemorrhage, mass lesion, hydrocephalus, or extra-axial fluid. Normal cerebral volume. No significant white matter disease. Pituitary, pineal, and cerebellar tonsils unremarkable. No upper cervical lesions. Flow voids are maintained throughout the carotid, basilar, and vertebral arteries. There are no areas of chronic hemorrhage. Visualized calvarium, skull base, and upper cervical osseous structures unremarkable. Scalp and extracranial soft tissues, orbits, sinuses, and mastoids  show no acute process. Good general agreement with prior CT. MRA HEAD FINDINGS The internal carotid arteries are widely patent. The basilar artery is widely patent with vertebrals contributing nearly equally, RIGHT slightly larger. Fetal LEFT PCA. No intracranial stenosis or aneurysm. Early bifurcation RIGHT MCA, normal variant. IMPRESSION: Negative exam.  No cause is seen for syncope. No acute intracranial findings are evident. Electronically Signed   By: Staci Righter M.D.   On: 09/27/2015 12:01      Subjective: Very anxious to go home. Denies complaints. States that he ambulated in the halls without dizziness, lightheadedness, dyspnea, chest pain or palpitations.  Discharge Exam:  Filed Vitals:   09/27/15 1717 09/28/15 0000 09/28/15 0546 09/28/15 0919  BP: 134/84 117/66 98/64 123/72  Pulse: 56 49 51 55  Temp: 98.6 F (37 C) 98 F (36.7 C) 97.8 F (36.6 C) 98 F (36.7 C)  TempSrc: Oral Oral Oral Oral  Resp: 18 20 18  18  Height:      Weight:      SpO2: 100% 97% 96% 99%    General exam: Pleasant young male lying comfortably supine in bed.  Respiratory system: Clear to auscultation. Respiratory effort normal. Cardiovascular system: S1 & S2 heard, RRR. No JVD, murmurs, rubs, gallops or clicks. No pedal edema. telemetry: Sinus bradycardia in the high 40s-50s overnight and SR this morning with activity.  Gastrointestinal system: Abdomen is nondistended, soft and nontender. No organomegaly or masses felt. Normal bowel sounds heard. Central nervous system: Alert and oriented. No focal neurological deficits. Extremities: Symmetric 5 x 5 power. Skin: No rashes, lesions or ulcers Psychiatry: Judgement and insight appear normal. Mood & affect appropriate.     The results of significant diagnostics from this hospitalization (including imaging, microbiology, ancillary and laboratory) are listed below for reference.     Microbiology: No results found for this or any previous visit (from  the past 240 hour(s)).   Labs: BNP (last 3 results) No results for input(s): BNP in the last 8760 hours. Basic Metabolic Panel:  Recent Labs Lab 09/26/15 2148  NA 137  K 3.9  CL 106  CO2 24  GLUCOSE 177*  BUN 15  CREATININE 1.10  CALCIUM 9.1   Liver Function Tests: No results for input(s): AST, ALT, ALKPHOS, BILITOT, PROT, ALBUMIN in the last 168 hours. No results for input(s): LIPASE, AMYLASE in the last 168 hours. No results for input(s): AMMONIA in the last 168 hours. CBC:  Recent Labs Lab 09/26/15 2148  WBC 6.0  HGB 15.0  HCT 42.3  MCV 83.1  PLT 193   Cardiac Enzymes: No results for input(s): CKTOTAL, CKMB, CKMBINDEX, TROPONINI in the last 168 hours. BNP: Invalid input(s): POCBNP CBG:  Recent Labs Lab 09/27/15 1159 09/27/15 1616 09/27/15 1938 09/28/15 0405 09/28/15 1103  GLUCAP 128* 155* 151* 133* 126*   D-Dimer No results for input(s): DDIMER in the last 72 hours. Hgb A1c No results for input(s): HGBA1C in the last 72 hours. Lipid Profile No results for input(s): CHOL, HDL, LDLCALC, TRIG, CHOLHDL, LDLDIRECT in the last 72 hours. Thyroid function studies No results for input(s): TSH, T4TOTAL, T3FREE, THYROIDAB in the last 72 hours.  Invalid input(s): FREET3 Anemia work up No results for input(s): VITAMINB12, FOLATE, FERRITIN, TIBC, IRON, RETICCTPCT in the last 72 hours. Urinalysis    Component Value Date/Time   COLORURINE YELLOW 09/26/2015 2350   APPEARANCEUR CLEAR 09/26/2015 2350   LABSPEC 1.011 09/26/2015 2350   PHURINE 7.0 09/26/2015 2350   GLUCOSEU NEGATIVE 09/26/2015 2350   GLUCOSEU NEGATIVE 08/29/2015 1727   HGBUR NEGATIVE 09/26/2015 2350   BILIRUBINUR NEGATIVE 09/26/2015 2350   KETONESUR NEGATIVE 09/26/2015 2350   PROTEINUR NEGATIVE 09/26/2015 2350   UROBILINOGEN 1.0 08/29/2015 1727   NITRITE NEGATIVE 09/26/2015 2350   LEUKOCYTESUR NEGATIVE 09/26/2015 2350   Sepsis Labs Invalid input(s): PROCALCITONIN,  WBC,   LACTICIDVEN    Time coordinating discharge: Over 30 minutes  SIGNED:  Vernell Leep, MD, FACP, FHM. [facility] Pager [phone]  If 7PM-7AM, please contact night-coverage [email or web address] Password [email or web address] 09/28/2015, 3:38 PM

## 2015-09-28 NOTE — Progress Notes (Signed)
VASCULAR LAB PRELIMINARY  PRELIMINARY  PRELIMINARY  PRELIMINARY  Carotid duplex completed.    Preliminary report:  1-39% ICA plaquing. Vertebral artery flow is antegrade.   Lil Lepage, RVT 09/28/2015, 9:10 AM

## 2015-09-28 NOTE — Discharge Instructions (Signed)

## 2015-09-28 NOTE — Progress Notes (Signed)
  Echocardiogram 2D Echocardiogram has been performed.  Jacob [name] 09/28/2015, 8:43 AM

## 2015-09-28 NOTE — Progress Notes (Signed)
Patient is being d/c home. D/c instructions given and patient verbalized understanding. 

## 2015-09-30 LAB — GLUCOSE, CAPILLARY
GLUCOSE-CAPILLARY: 130 mg/dL — AB (ref 65–99)
GLUCOSE-CAPILLARY: 161 mg/dL — AB (ref 65–99)

## 2015-10-02 ENCOUNTER — Other Ambulatory Visit: Payer: Self-pay | Admitting: *Deleted

## 2015-10-02 ENCOUNTER — Telehealth: Payer: Self-pay | Admitting: Cardiology

## 2015-10-02 ENCOUNTER — Other Ambulatory Visit: Payer: Self-pay | Admitting: Cardiology

## 2015-10-02 ENCOUNTER — Telehealth: Payer: Self-pay | Admitting: Emergency Medicine

## 2015-10-02 DIAGNOSIS — R55 Syncope and collapse: Secondary | ICD-10-CM

## 2015-10-02 NOTE — Telephone Encounter (Signed)
-----   Message from Earnestine Mealing sent at 09/30/2015  3:00 PM EDT ----- Did you get this message? ----- Message -----    From: Isaiah Serge, NP    Sent: 09/28/2015   1:03 PM      To: [facility] Scheduling  Pt needs 30 day event monitor for syncope.  If abnormal he would need follow up with [place] but depends on the results.  Thanks

## 2015-10-02 NOTE — Telephone Encounter (Signed)
Patient called and stated diabetes medicine is working. Needs a letter written saying the diabetes is under control and blood pressure is under control with medication.Please call patient when its doe and he will pick it up.

## 2015-10-03 NOTE — Telephone Encounter (Signed)
I have called the patient. He states he will call us back to schedule appt.

## 2015-10-03 NOTE — Telephone Encounter (Signed)
Ok - to corinne please to do letter, thanks

## 2015-10-03 NOTE — Telephone Encounter (Signed)
Patient called about this letter. HE can come by and pick up this letter today. Please follow up, thank you.

## 2015-10-03 NOTE — Telephone Encounter (Signed)
Please advise 

## 2015-10-04 NOTE — Telephone Encounter (Signed)
Letter placed upfront, patient aware

## 2015-10-08 ENCOUNTER — Ambulatory Visit (INDEPENDENT_AMBULATORY_CARE_PROVIDER_SITE_OTHER): Payer: BLUE CROSS/BLUE SHIELD | Admitting: Internal Medicine

## 2015-10-08 ENCOUNTER — Encounter: Payer: Self-pay | Admitting: Internal Medicine

## 2015-10-08 VITALS — BP 118/72 | HR 78 | Temp 98.1°F | Resp 20 | Wt 188.0 lb

## 2015-10-08 DIAGNOSIS — N529 Male erectile dysfunction, unspecified: Secondary | ICD-10-CM

## 2015-10-08 DIAGNOSIS — I10 Essential (primary) hypertension: Secondary | ICD-10-CM

## 2015-10-08 DIAGNOSIS — J309 Allergic rhinitis, unspecified: Secondary | ICD-10-CM

## 2015-10-08 DIAGNOSIS — G894 Chronic pain syndrome: Secondary | ICD-10-CM | POA: Diagnosis not present

## 2015-10-08 MED ORDER — TRIAMCINOLONE ACETONIDE 55 MCG/ACT NA AERO: 2.0000 | INHALATION_SPRAY | Freq: Every day | NASAL | Status: DC

## 2015-10-08 MED ORDER — CETIRIZINE HCL 10 MG PO TABS: 10.0000 mg | ORAL_TABLET | Freq: Every day | ORAL | Status: DC

## 2015-10-08 MED ORDER — SILDENAFIL CITRATE 20 MG PO TABS: ORAL_TABLET | ORAL | Status: DC

## 2015-10-08 NOTE — Patient Instructions (Signed)
Please take all new medication as prescribed - the nasacort  Please continue all other medications as before, including the zyrtec, and sildenafil  Please have the pharmacy call with any other refills you may need.  Please keep your appointments with your specialists as you may have planned

## 2015-10-08 NOTE — Progress Notes (Signed)
Subjective:    Patient ID: Jacob [name], male    DOB: June 24, 1965, 50 y.o.   MRN: [id number]  HPI  Here to f/u - Does have several wks ongoing nasal allergy symptoms with clearish congestion, itch and sneezing, without fever, pain, ST, cough, swelling or wheezing.   Does also have very small cystic lesion to the left face about 1 cm below the left eye lateral canthus, NT, just sort of came up a few wks ago, NT, no drainage.  Pt denies chest pain, increased sob or doe, wheezing, orthopnea, PND, increased LE swelling, palpitations, dizziness or syncope. Has ongoing Ed symptoms not improved, asks for revatio refill. Denies urinary symptoms such as dysuria, frequency, urgency, flank pain, hematuria or n/v, fever, chills.  Also with chronic pain overall stable, does not want further narcotic.  Pt continues to have recurring LBP without change in severity, bowel or bladder change, fever, wt loss,  worsening LE pain/numbness/weakness, gait change or falls.  Had recent echo July 1. Past Medical History  Diagnosis Date  . Cholesteatoma of right ear   . Anxiety   . GERD (gastroesophageal reflux disease)   . Allergy   . Diabetes mellitus ([place])     bordreline  . Hypertension   . Hyperlipidemia 10/18/2014   Past Surgical History  Procedure Laterality Date  . Lumbar disc surgery    . Inner ear surgery      right  . Cholecystectomy    . Tubes in bil ears    . Hypospadias correction N/A 1978    reports that he has never smoked. He has never used smokeless tobacco. He reports that he drinks about 3.0 oz of alcohol per week. He reports that he does not use illicit drugs. family history includes COPD in his mother; Diabetes in his maternal grandmother and paternal grandmother; Heart disease in his father; Hypertension in his mother; Lung cancer in his maternal uncle. There is no history of Colon cancer, Esophageal cancer, Rectal cancer, or Stomach cancer. No Known Allergies Current Outpatient  Prescriptions on File Prior to Visit  Medication Sig Dispense Refill  . ALPRAZolam (XANAX) 0.5 MG tablet TAKE ONE TO TWO TABLETS BY MOUTH ONCE DAILY AT BEDTIME AS NEEDED 60 tablet 5  . aspirin EC 81 MG tablet Take 1 tablet (81 mg total) by mouth daily. 90 tablet 11  . Diclofenac Sodium 2 % SOLN Apply 1 pump twice daily. 112 g 3  . metFORMIN (GLUCOPHAGE-XR) 500 MG 24 hr tablet 2 tabs by mouth per day 180 tablet 3  . Multiple Vitamin (MULTIVITAMIN WITH MINERALS) TABS tablet Take 1 tablet by mouth daily.    . Omega-3 Fatty Acids (FISH OIL PO) Take 1 capsule by mouth daily.    . ramipril (ALTACE) 10 MG capsule Take 1 capsule (10 mg total) by mouth daily. 90 capsule 1   No current facility-administered medications on file prior to visit.   Review of Systems  Constitutional: Negative for unusual diaphoresis or night sweats HENT: Negative for ear swelling or discharge Eyes: Negative for worsening visual haziness  Respiratory: Negative for choking and stridor.   Gastrointestinal: Negative for distension or worsening eructation Genitourinary: Negative for retention or change in urine volume.  Musculoskeletal: Negative for other MSK pain or swelling Skin: Negative for color change and worsening wound Neurological: Negative for tremors and numbness other than noted  Psychiatric/Behavioral: Negative for decreased concentration or agitation other than above       Objective:  Physical Exam BP 118/72 mmHg  Pulse 78  Temp(Src) 98.1 F (36.7 C) (Oral)  Resp 20  Wt 188 lb (85.276 kg)  SpO2 96% VS noted, not ill appearing Constitutional: Pt appears in no apparent distress HENT: Head: NCAT.  Right Ear: External ear normal.  Left Ear: External ear normal.  Bilat tm's with mild erythema.  Max sinus areas non tender.  Pharynx with mild erythema, no exudate Eyes: . Pupils are equal, round, and reactive to light. Conjunctivae and EOM are normal Neck: Normal range of motion. Neck supple.    Cardiovascular: Normal rate and regular rhythm.   Pulmonary/Chest: Effort normal and breath sounds without rales or wheezing.  Neurological: Pt is alert. Not confused , motor grossly intact Skin: Skin is warm. No rash, no LE edema Psychiatric: Pt behavior is normal. No agitation.   Recent echo summary September 28, 2015 LV EF: 55% - 60%  ------------------------------------------------------------------- Indications: Syncope 780.2.  ------------------------------------------------------------------- History: Risk factors: Hypertension. Diabetes mellitus. Dyslipidemia.  ------------------------------------------------------------------- Study Conclusions  - Left ventricle: The cavity size was normal. Systolic function was  normal. The estimated ejection fraction was in the range of 55%  to 60%. Wall motion was normal; there were no regional wall  motion abnormalities.    Assessment & Plan:

## 2015-10-08 NOTE — Progress Notes (Signed)
Pre visit review using our clinic review tool, if applicable. No additional management support is needed unless otherwise documented below in the visit note. 

## 2015-10-13 NOTE — Assessment & Plan Note (Signed)
Stable off narcotic at this time,  to f/u any worsening symptoms or concerns

## 2015-10-13 NOTE — Assessment & Plan Note (Signed)
Mild to mod, for add nasacort otc asd, cont zyrtec, consider allergy referral, to f/u any worsening symptoms or concerns

## 2015-10-13 NOTE — Assessment & Plan Note (Signed)
stable overall by history and exam, recent data reviewed with pt, and pt to continue medical treatment as before,  to f/u any worsening symptoms or concerns BP Readings from Last 3 Encounters:  10/08/15 118/72  09/28/15 123/72  08/29/15 130/80

## 2015-10-13 NOTE — Assessment & Plan Note (Signed)
Persistent stable, for revatio refill,  to f/u any worsening symptoms or concerns

## 2015-12-10 ENCOUNTER — Other Ambulatory Visit: Payer: Self-pay | Admitting: Internal Medicine

## 2015-12-10 DIAGNOSIS — I10 Essential (primary) hypertension: Secondary | ICD-10-CM

## 2015-12-11 ENCOUNTER — Other Ambulatory Visit: Payer: Self-pay | Admitting: Internal Medicine

## 2015-12-12 NOTE — Telephone Encounter (Signed)
Done hardcopy to Corinne  

## 2016-01-08 ENCOUNTER — Other Ambulatory Visit: Payer: Self-pay | Admitting: Internal Medicine

## 2016-01-08 DIAGNOSIS — E119 Type 2 diabetes mellitus without complications: Secondary | ICD-10-CM

## 2016-02-05 ENCOUNTER — Other Ambulatory Visit: Payer: BLUE CROSS/BLUE SHIELD

## 2016-02-05 ENCOUNTER — Encounter: Payer: Self-pay | Admitting: Internal Medicine

## 2016-02-05 ENCOUNTER — Ambulatory Visit (INDEPENDENT_AMBULATORY_CARE_PROVIDER_SITE_OTHER): Payer: BLUE CROSS/BLUE SHIELD | Admitting: Internal Medicine

## 2016-02-05 VITALS — BP 132/72 | HR 71 | Temp 98.6°F | Resp 20 | Wt 200.0 lb

## 2016-02-05 DIAGNOSIS — E119 Type 2 diabetes mellitus without complications: Secondary | ICD-10-CM

## 2016-02-05 DIAGNOSIS — K219 Gastro-esophageal reflux disease without esophagitis: Secondary | ICD-10-CM | POA: Diagnosis not present

## 2016-02-05 DIAGNOSIS — R350 Frequency of micturition: Secondary | ICD-10-CM

## 2016-02-05 DIAGNOSIS — R300 Dysuria: Secondary | ICD-10-CM | POA: Diagnosis not present

## 2016-02-05 DIAGNOSIS — H9193 Unspecified hearing loss, bilateral: Secondary | ICD-10-CM

## 2016-02-05 DIAGNOSIS — J309 Allergic rhinitis, unspecified: Secondary | ICD-10-CM | POA: Diagnosis not present

## 2016-02-05 MED ORDER — TRIAMCINOLONE ACETONIDE 55 MCG/ACT NA AERO: 2.0000 | INHALATION_SPRAY | Freq: Every day | NASAL | 12 refills | Status: DC

## 2016-02-05 MED ORDER — METFORMIN HCL ER 500 MG PO TB24: 1500.0000 mg | ORAL_TABLET | Freq: Every day | ORAL | 3 refills | Status: DC

## 2016-02-05 MED ORDER — PANTOPRAZOLE SODIUM 40 MG PO TBEC: 40.0000 mg | DELAYED_RELEASE_TABLET | Freq: Every day | ORAL | 3 refills | Status: DC

## 2016-02-05 NOTE — Patient Instructions (Addendum)
Your specimen results will be known soon, and you will be called if an antibiotic is needed  OK to stop the omeprazole, and Please take all new medication as prescribed - the generic protonix  Please otherwise continue all other medications as before, including restarting the nasacort for allergies  Please have the pharmacy call with any other refills you may need.  Please keep your appointments with your specialists as you may have planned  Your ears were both cleared of wax today with irrigation  Your Hgba1c today in the office is:  7.3, so we should increase the metformin ER 500 mg to THREE tabs in the AM for sugar  Please return in 6 months, or sooner if needed

## 2016-02-05 NOTE — Assessment & Plan Note (Addendum)
Mild uncontrolled, to icnrease the metformin to 1500 qam, o/w  to f/u any worsening symptoms or concerns Lab Results  Component Value Date   HGBA1C 7.2 (H) 08/29/2015

## 2016-02-05 NOTE — Assessment & Plan Note (Signed)
Mild to mod, for nasacort asd,,  to f/u any worsening symptoms or concerns 

## 2016-02-05 NOTE — Progress Notes (Signed)
Subjective:    Patient ID: Jacob [name], male    DOB: Jul 30, 1965, 50 y.o.   MRN: [id number]  HPI  Here to f/u; overall doing ok,  Pt denies chest pain, increasing sob or doe, wheezing, orthopnea, PND, increased LE swelling, palpitations, dizziness or syncope.  Pt denies new neurological symptoms such as new headache, or facial or extremity weakness or numbness.  Pt denies polydipsia, polyuria, or low sugar episode.   Pt denies new neurological symptoms such as new headache, or facial or extremity weakness or numbness.   Pt states overall good compliance with meds, mostly trying to follow appropriate diet, with wt overall stable,  but little exercise however.  Has overall been less active recently, gained a few lbs due to less physical work, cbg's mostly < 160 but did hit high of 314 one time.  Also has mild worsening reflux, but without abd pain, dysphagia, n/v, bowel change or blood.  Has bilat ear reduced hearing with ? Wax impactions as had before.  Does have several wks ongoing nasal allergy symptoms with clearish congestion, itch and sneezing, without fever, pain, ST, cough, swelling or wheezing.  Denies urinary symptoms such as dysuria, urgency, flank pain, hematuria or n/v, fever, chills but has had urinary frequency for several days. Past Medical History:  Diagnosis Date  . Allergy   . Anxiety   . Cholesteatoma of right ear   . 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 GERD (gastroesophageal reflux disease)   . Hyperlipidemia 10/18/2014  . Hypertension    Past Surgical History:  Procedure Laterality Date  . CHOLECYSTECTOMY    . [place]  . INNER EAR SURGERY     right  . LUMBAR DISC SURGERY    . tubes in bil ears      reports that he has never smoked. He has never used smokeless tobacco. He reports that he drinks about 3.0 oz of alcohol per week . He reports that he does not use drugs. family history includes COPD in his mother; Diabetes in his maternal  grandmother and paternal grandmother; Heart disease in his father; Hypertension in his mother; Lung cancer in his maternal uncle. No Known Allergies Current Outpatient Prescriptions on File Prior to Visit  Medication Sig Dispense Refill  . ALPRAZolam (XANAX) 0.5 MG tablet TAKE ONE TO TWO TABLETS BY MOUTH AT BEDTIME AS NEEDED 60 tablet 5  . aspirin EC 81 MG tablet Take 1 tablet (81 mg total) by mouth daily. 90 tablet 11  . cetirizine (ZYRTEC) 10 MG tablet Take 1 tablet (10 mg total) by mouth daily. 30 tablet 11  . Diclofenac Sodium 2 % SOLN Apply 1 pump twice daily. 112 g 3  . Multiple Vitamin (MULTIVITAMIN WITH MINERALS) TABS tablet Take 1 tablet by mouth daily.    . Omega-3 Fatty Acids (FISH OIL PO) Take 1 capsule by mouth daily.    . ramipril (ALTACE) 10 MG capsule TAKE ONE CAPSULE BY MOUTH ONCE DAILY 90 capsule 1  . sildenafil (REVATIO) 20 MG tablet Take 3-5 tabs by mouth as needed 60 tablet 2   No current facility-administered medications on file prior to visit.    Review of Systems  Constitutional: Negative for unusual diaphoresis or night sweats HENT: Negative for ear swelling or discharge Eyes: Negative for worsening visual haziness  Respiratory: Negative for choking and stridor.   Gastrointestinal: Negative for distension or worsening eructation Genitourinary: Negative for retention or change in urine volume.  Musculoskeletal: Negative for other MSK pain or swelling Skin: Negative for color change and worsening wound Neurological: Negative for tremors and numbness other than noted  Psychiatric/Behavioral: Negative for decreased concentration or agitation other than above   All other system neg per pt    Objective:   Physical Exam BP 132/72   Pulse 71   Temp 98.6 F (37 C) (Oral)   Resp 20   Wt 200 lb (90.7 kg)   SpO2 98%   BMI 27.89 kg/m  VS noted,  Constitutional: Pt appears in no apparent distress HENT: Head: NCAT.  Right Ear: External ear normal.  Left Ear:  External ear normal.  Eyes: . Pupils are equal, round, and reactive to light. Conjunctivae and EOM are normal Bilat tm's with mild erythema.  Max sinus areas non tender.  Pharynx with mild erythema, no exudate Bilat wax impactions resolved with irrigation, canals clear without swelling or erythema Neck: Normal range of motion. Neck supple.  Cardiovascular: Normal rate and regular rhythm.   Pulmonary/Chest: Effort normal and breath sounds without rales or wheezing.  Abd:  Soft, NT, ND, + BS - benign, no flank tender Neurological: Pt is alert. Not confused , motor grossly intact Skin: Skin is warm. No rash, no LE edema Psychiatric: Pt behavior is normal. No agitation.   Today POCT  Hgba1c - 7.3    Assessment & Plan:

## 2016-02-05 NOTE — Assessment & Plan Note (Addendum)
?   Related to incresaed sugars but unlikely, for UA and cx, tx pending results,  to f/u any worsening symptoms or concerns  Note:  Total time for pt hx, exam, review of record with pt in the room, determination of diagnoses and plan for further eval and tx is > 40 min, with over 50% spent in coordination and counseling of patient

## 2016-02-05 NOTE — Assessment & Plan Note (Addendum)
Mild to mod, for protonix 40 qd course,  to f/u any worsening symptoms or concerns

## 2016-02-05 NOTE — Assessment & Plan Note (Signed)
Improved with irrigation,  to f/u any worsening symptoms or concerns  

## 2016-02-05 NOTE — Progress Notes (Signed)
Pre visit review using our clinic review tool, if applicable. No additional management support is needed unless otherwise documented below in the visit note. 

## 2016-02-07 LAB — CULTURE, URINE COMPREHENSIVE: ORGANISM ID, BACTERIA: NO GROWTH

## 2016-02-13 ENCOUNTER — Other Ambulatory Visit (HOSPITAL_COMMUNITY)
Admission: RE | Admit: 2016-02-13 | Discharge: 2016-02-13 | Disposition: A | Discharge status: Home / Self Care | Payer: BLUE CROSS/BLUE SHIELD | Source: Ambulatory Visit | Attending: Nurse Practitioner | Admitting: Nurse Practitioner

## 2016-02-13 ENCOUNTER — Ambulatory Visit (INDEPENDENT_AMBULATORY_CARE_PROVIDER_SITE_OTHER): Payer: BLUE CROSS/BLUE SHIELD | Admitting: Nurse Practitioner

## 2016-02-13 ENCOUNTER — Encounter: Payer: Self-pay | Admitting: Nurse Practitioner

## 2016-02-13 ENCOUNTER — Other Ambulatory Visit: Payer: BLUE CROSS/BLUE SHIELD

## 2016-02-13 VITALS — BP 140/80 | HR 88 | Temp 98.6°F | Wt 201.0 lb

## 2016-02-13 DIAGNOSIS — R300 Dysuria: Secondary | ICD-10-CM | POA: Diagnosis not present

## 2016-02-13 DIAGNOSIS — Z7251 High risk heterosexual behavior: Secondary | ICD-10-CM | POA: Diagnosis not present

## 2016-02-13 DIAGNOSIS — Z113 Encounter for screening for infections with a predominantly sexual mode of transmission: Secondary | ICD-10-CM | POA: Insufficient documentation

## 2016-02-13 MED ORDER — PHENAZOPYRIDINE HCL 100 MG PO TABS: 100.0000 mg | ORAL_TABLET | Freq: Three times a day (TID) | ORAL | 0 refills | Status: DC | PRN

## 2016-02-13 NOTE — Progress Notes (Signed)
Subjective:  Patient ID: Jacob [name], male    DOB: 1965/11/16  Age: 50 y.o. MRN: [id number]  CC: Dysuria (Pt stated have burning sensation.)   Dysuria   This is a new problem. The current episode started in the past 7 days. The problem occurs intermittently. The problem has been unchanged. The quality of the pain is described as burning. There has been no fever. He is sexually active. There is no history of pyelonephritis. Associated symptoms include frequency. Pertinent negatives include no chills, discharge, flank pain, hematuria, hesitancy, nausea, sweats, urgency or vomiting. He has tried nothing for the symptoms. There is no history of catheterization, kidney stones, recurrent UTIs, urinary stasis or a urological procedure.    Outpatient Medications Prior to Visit  Medication Sig Dispense Refill  . ALPRAZolam (XANAX) 0.5 MG tablet TAKE ONE TO TWO TABLETS BY MOUTH AT BEDTIME AS NEEDED 60 tablet 5  . aspirin EC 81 MG tablet Take 1 tablet (81 mg total) by mouth daily. 90 tablet 11  . cetirizine (ZYRTEC) 10 MG tablet Take 1 tablet (10 mg total) by mouth daily. 30 tablet 11  . Diclofenac Sodium 2 % SOLN Apply 1 pump twice daily. 112 g 3  . metFORMIN (GLUCOPHAGE-XR) 500 MG 24 hr tablet Take 3 tablets (1,500 mg total) by mouth daily with breakfast. 270 tablet 3  . Multiple Vitamin (MULTIVITAMIN WITH MINERALS) TABS tablet Take 1 tablet by mouth daily.    . Omega-3 Fatty Acids (FISH OIL PO) Take 1 capsule by mouth daily.    . pantoprazole (PROTONIX) 40 MG tablet Take 1 tablet (40 mg total) by mouth daily. 90 tablet 3  . ramipril (ALTACE) 10 MG capsule TAKE ONE CAPSULE BY MOUTH ONCE DAILY 90 capsule 1  . sildenafil (REVATIO) 20 MG tablet Take 3-5 tabs by mouth as needed 60 tablet 2  . triamcinolone (NASACORT AQ) 55 MCG/ACT AERO nasal inhaler Place 2 sprays into the nose daily. 1 Inhaler 12   No facility-administered medications prior to visit.     ROS See HPI  Objective:  BP 140/80  (BP Location: Left Arm, Patient Position: Sitting, Cuff Size: Normal)   Pulse 88   Temp 98.6 F (37 C)   Wt 201 lb (91.2 kg)   SpO2 98%   BMI 28.03 kg/m   BP Readings from Last 3 Encounters:  02/13/16 140/80  02/05/16 132/72  10/08/15 118/72    Wt Readings from Last 3 Encounters:  02/13/16 201 lb (91.2 kg)  02/05/16 200 lb (90.7 kg)  10/08/15 188 lb (85.3 kg)    Physical Exam  Constitutional: He is oriented to person, place, and time. No distress.  Cardiovascular: Normal rate.   Pulmonary/Chest: Effort normal.  Abdominal: Soft. Bowel sounds are normal.  Neurological: He is alert and oriented to person, place, and time.  Skin: Skin is warm and dry.  Vitals reviewed.   Lab Results  Component Value Date   WBC 6.0 09/26/2015   HGB 15.0 09/26/2015   HCT 42.3 09/26/2015   PLT 193 09/26/2015   GLUCOSE 177 (H) 09/26/2015   CHOL 239 (H) 08/29/2015   TRIG 109.0 08/29/2015   HDL 48.40 08/29/2015   LDLDIRECT 144.0 10/18/2014   LDLCALC 169 (H) 08/29/2015   ALT 62 (H) 08/29/2015   AST 45 (H) 08/29/2015   NA 137 09/26/2015   K 3.9 09/26/2015   CL 106 09/26/2015   CREATININE 1.10 09/26/2015   BUN 15 09/26/2015   CO2 24 09/26/2015  TSH 1.58 08/29/2015   PSA 0.85 08/29/2015   INR 1.0 01/18/2007   HGBA1C 7.2 (H) 08/29/2015   MICROALBUR 1.1 08/29/2015    Dg Eye Foreign Body  Result Date: 09/27/2015 CLINICAL DATA:  Metal working/exposure; clearance prior to MRI EXAM: ORBITS FOR FOREIGN BODY - 2 VIEW COMPARISON:  Head CT 09/27/2015 FINDINGS: There is no evidence of metallic foreign body within the orbits. There is a 1 mm punctate metallic density which projects inferior and lateral to the left orbit and corresponds to a superficial foreign body on the earlier head CT. IMPRESSION: No evidence of metallic foreign body within the orbits. Punctate superficial foreign body inferior and lateral to the left orbit. Patient is considered safe to proceed with MRI with standard  precautions. Electronically Signed   By: Logan Bores M.D.   On: 09/27/2015 11:18   Dg Chest 2 View  Result Date: 09/27/2015 CLINICAL DATA:  Syncope.  Intermittent dyspnea. EXAM: CHEST  2 VIEW COMPARISON:  10/07/2014 FINDINGS: The heart size and mediastinal contours are within normal limits. Both lungs are clear. The visualized skeletal structures are unremarkable. IMPRESSION: No active cardiopulmonary disease. Electronically Signed   By: Andreas Newport M.D.   On: 09/27/2015 01:41   Ct Head Wo Contrast  Result Date: 09/27/2015 CLINICAL DATA:  50 year old male with syncope EXAM: CT HEAD WITHOUT CONTRAST TECHNIQUE: Contiguous axial images were obtained from the base of the skull through the vertex without intravenous contrast. COMPARISON:  Head CT dated 12/24/2011 FINDINGS: The ventricles and the sulci are appropriate in size for the patient's age. There is no intracranial hemorrhage. No midline shift or mass effect identified. The gray-white matter differentiation is preserved. The visualized paranasal sinuses and mastoid air cells are well aerated. No acute calvarial pathology. IMPRESSION: No acute intracranial pathology. Electronically Signed   By: Anner Crete M.D.   On: 09/27/2015 01:40   Mr Brain Wo Contrast  Result Date: 09/27/2015 CLINICAL DATA:  Syncopal episode with fall, which occurred after working outside for a long time with subsequent dehydration. No reported neurologic deficit. Stroke risk factors include diabetes, hypertension, and hyperlipidemia. EXAM: MRI HEAD WITHOUT CONTRAST MRA HEAD WITHOUT CONTRAST TECHNIQUE: Multiplanar, multiecho pulse sequences of the brain and surrounding structures were obtained without intravenous contrast. Angiographic images of the head were obtained using MRA technique without contrast. COMPARISON:  CT head 09/27/2015. FINDINGS: MRI HEAD FINDINGS No evidence for acute infarction, hemorrhage, mass lesion, hydrocephalus, or extra-axial fluid. Normal  cerebral volume. No significant white matter disease. Pituitary, pineal, and cerebellar tonsils unremarkable. No upper cervical lesions. Flow voids are maintained throughout the carotid, basilar, and vertebral arteries. There are no areas of chronic hemorrhage. Visualized calvarium, skull base, and upper cervical osseous structures unremarkable. Scalp and extracranial soft tissues, orbits, sinuses, and mastoids show no acute process. Good general agreement with prior CT. MRA HEAD FINDINGS The internal carotid arteries are widely patent. The basilar artery is widely patent with vertebrals contributing nearly equally, RIGHT slightly larger. Fetal LEFT PCA. No intracranial stenosis or aneurysm. Early bifurcation RIGHT MCA, normal variant. IMPRESSION: Negative exam.  No cause is seen for syncope. No acute intracranial findings are evident. Electronically Signed   By: Staci Righter M.D.   On: 09/27/2015 12:01   Mr Jodene Nam Head/brain [id number] Cm  Result Date: 09/27/2015 CLINICAL DATA:  Syncopal episode with fall, which occurred after working outside for a long time with subsequent dehydration. No reported neurologic deficit. Stroke risk factors include diabetes, hypertension, and hyperlipidemia. EXAM: MRI HEAD  WITHOUT CONTRAST MRA HEAD WITHOUT CONTRAST TECHNIQUE: Multiplanar, multiecho pulse sequences of the brain and surrounding structures were obtained without intravenous contrast. Angiographic images of the head were obtained using MRA technique without contrast. COMPARISON:  CT head 09/27/2015. FINDINGS: MRI HEAD FINDINGS No evidence for acute infarction, hemorrhage, mass lesion, hydrocephalus, or extra-axial fluid. Normal cerebral volume. No significant white matter disease. Pituitary, pineal, and cerebellar tonsils unremarkable. No upper cervical lesions. Flow voids are maintained throughout the carotid, basilar, and vertebral arteries. There are no areas of chronic hemorrhage. Visualized calvarium, skull base, and upper  cervical osseous structures unremarkable. Scalp and extracranial soft tissues, orbits, sinuses, and mastoids show no acute process. Good general agreement with prior CT. MRA HEAD FINDINGS The internal carotid arteries are widely patent. The basilar artery is widely patent with vertebrals contributing nearly equally, RIGHT slightly larger. Fetal LEFT PCA. No intracranial stenosis or aneurysm. Early bifurcation RIGHT MCA, normal variant. IMPRESSION: Negative exam.  No cause is seen for syncope. No acute intracranial findings are evident. Electronically Signed   By: Staci Righter M.D.   On: 09/27/2015 12:01    Assessment & Plan:   Albara was seen today for dysuria.  Diagnoses and all orders for this visit:  Dysuria -     Cancel: POCT urinalysis dipstick -     Chlamydia/Gonococcus/Trichomonas, NAA; Future -     Urinalysis, Routine w reflex microscopic; Future -     phenazopyridine (PYRIDIUM) 100 MG tablet; Take 1 tablet (100 mg total) by mouth 3 (three) times daily as needed for pain (with food).  Unprotected sexual intercourse -     Chlamydia/Gonococcus/Trichomonas, NAA; Future   I am having Mr. Ting start on phenazopyridine. I am also having him maintain his aspirin EC, Diclofenac Sodium, Omega-3 Fatty Acids (FISH OIL PO), multivitamin with minerals, cetirizine, sildenafil, ramipril, ALPRAZolam, triamcinolone, pantoprazole, and metFORMIN.  Meds ordered this encounter  Medications  . phenazopyridine (PYRIDIUM) 100 MG tablet    Sig: Take 1 tablet (100 mg total) by mouth 3 (three) times daily as needed for pain (with food).    Dispense:  10 tablet    Refill:  0    Order Specific Question:   Supervising Provider    Answer:   Cassandria Anger [[id number]]    Follow-up: No Follow-up on file.  Wilfred Lacy, NP

## 2016-02-13 NOTE — Progress Notes (Signed)
Pre visit review using our clinic review tool, if applicable. No additional management support is needed unless otherwise documented below in the visit note. 

## 2016-02-13 NOTE — Patient Instructions (Signed)
You will be called with lab results. Push oral fluids (water)  Dysuria Introduction Dysuria is pain or discomfort while urinating. The pain or discomfort may be felt in the tube that carries urine out of the bladder (urethra) or in the surrounding tissue of the genitals. The pain may also be felt in the groin area, lower abdomen, and lower back. You may have to urinate frequently or have the sudden feeling that you have to urinate (urgency). Dysuria can affect both men and women, but is more common in women. Dysuria can be caused by many different things, including:  Urinary tract infection in women.  Infection of the kidney or bladder.  Kidney stones or bladder stones.  Certain sexually transmitted infections (STIs), such as chlamydia.  Dehydration.  Inflammation of the vagina.  Use of certain medicines.  Use of certain soaps or scented products that cause irritation. Follow these instructions at home: Watch your dysuria for any changes. The following actions may help to reduce any discomfort you are feeling:  Drink enough fluid to keep your urine clear or pale yellow.  Empty your bladder often. Avoid holding urine for long periods of time.  After a bowel movement or urination, women should cleanse from front to back, using each tissue only once.  Empty your bladder after sexual intercourse.  Take medicines only as directed by your health care provider.  If you were prescribed an antibiotic medicine, finish it all even if you start to feel better.  Avoid caffeine, tea, and alcohol. They can irritate the bladder and make dysuria worse. In men, alcohol may irritate the prostate.  Keep all follow-up visits as directed by your health care provider. This is important.  If you had any tests done to find the cause of dysuria, it is your responsibility to obtain your test results. Ask the lab or department performing the test when and how you will get your results. Talk with your  health care provider if you have any questions about your results. Contact a health care provider if:  You develop pain in your back or sides.  You have a fever.  You have nausea or vomiting.  You have blood in your urine.  You are not urinating as often as you usually do. Get help right away if:  You pain is severe and not relieved with medicines.  You are unable to hold down any fluids.  You or someone else notices a change in your mental function.  You have a rapid heartbeat at rest.  You have shaking or chills.  You feel extremely weak. This information is not intended to replace advice given to you by your health care provider. Make sure you discuss any questions you have with your health care provider. Document Released: 12/13/2003 Document Revised: 08/22/2015 Document Reviewed: 11/09/2013  2017 [employer]

## 2016-02-18 LAB — URINE CYTOLOGY ANCILLARY ONLY
Chlamydia: NEGATIVE
Neisseria Gonorrhea: NEGATIVE
Trichomonas: NEGATIVE

## 2016-02-18 NOTE — Progress Notes (Signed)
Normal results, see office note

## 2016-03-07 ENCOUNTER — Encounter: Payer: Self-pay | Admitting: Family Medicine

## 2016-03-07 ENCOUNTER — Ambulatory Visit (INDEPENDENT_AMBULATORY_CARE_PROVIDER_SITE_OTHER): Payer: BLUE CROSS/BLUE SHIELD | Admitting: Family Medicine

## 2016-03-07 VITALS — BP 140/90 | HR 84 | Ht 71.0 in | Wt 196.0 lb

## 2016-03-07 DIAGNOSIS — N401 Enlarged prostate with lower urinary tract symptoms: Secondary | ICD-10-CM

## 2016-03-07 DIAGNOSIS — K4090 Unilateral inguinal hernia, without obstruction or gangrene, not specified as recurrent: Secondary | ICD-10-CM

## 2016-03-07 DIAGNOSIS — R300 Dysuria: Secondary | ICD-10-CM | POA: Diagnosis not present

## 2016-03-07 DIAGNOSIS — N410 Acute prostatitis: Secondary | ICD-10-CM

## 2016-03-07 DIAGNOSIS — R351 Nocturia: Secondary | ICD-10-CM

## 2016-03-07 LAB — POC URINALSYSI DIPSTICK (AUTOMATED)
Bilirubin, UA: NEGATIVE
Blood, UA: NEGATIVE
Glucose, UA: NEGATIVE
KETONES UA: NEGATIVE
LEUKOCYTES UA: NEGATIVE
NITRITE UA: NEGATIVE
PH UA: 6.5
Spec Grav, UA: 1.025
Urobilinogen, UA: 1

## 2016-03-07 MED ORDER — CIPROFLOXACIN HCL 500 MG PO TABS: 500.0000 mg | ORAL_TABLET | Freq: Two times a day (BID) | ORAL | 0 refills | Status: DC

## 2016-03-07 MED ORDER — TAMSULOSIN HCL 0.4 MG PO CAPS: 0.4000 mg | ORAL_CAPSULE | Freq: Every day | ORAL | 3 refills | Status: DC

## 2016-03-07 NOTE — Patient Instructions (Addendum)
Prostatitis Prostatitis is swelling or inflammation of the prostate gland. The prostate is a walnut-sized gland that is involved in the production of semen. It is located below a man's bladder, in front of the rectum. There are four types of prostatitis:  Chronic nonbacterial prostatitis. This is the most common type of prostatitis. It may be associated with a viral infection or autoimmune disorder.  Acute bacterial prostatitis. This is the least common type of prostatitis. It starts quickly and is usually associated with a bladder infection, high fever, and shaking chills. It can occur at any age.  Chronic bacterial prostatitis. This type usually results from acute bacterial prostatitis that happens repeatedly (is recurrent) or has not been treated properly. It can occur in men of any age but is most common among middle-aged men whose prostate has begun to get larger. The symptoms are not as severe as symptoms caused by acute bacterial prostatitis.  Prostatodynia or chronic pelvic pain syndrome (CPPS). This type is also called pelvic floor disorder. It is associated with increased muscular tone in the pelvis surrounding the prostate. What are the causes? Bacterial prostatitis is caused by infection from bacteria. Chronic nonbacterial prostatitis may be caused by:  Urinary tract infections (UTIs).  Nerve damage.  A response by the body's disease-fighting system (autoimmune response).  Chemicals in the urine. The causes of the other types of prostatitis are usually not known. What are the signs or symptoms? Symptoms of this condition vary depending upon the type of prostatitis. If you have acute bacterial prostatitis, you may experience:  Urinary symptoms, such as:  Painful urination.  Burning during urination.  Frequent and sudden urges to urinate.  Inability to start urinating.  A weak or interrupted stream of urine.  Vomiting.  Nausea.  Fever.  Chills.  Inability to  empty the bladder completely.  Pain in the:  Muscles or joints.  Lower back.  Lower abdomen. If you have any of the other types of prostatitis, you may experience:  Urinary symptoms, such as:  Sudden urges to urinate.  Frequent urination.  Difficulty starting urination.  Weak urine stream.  Dribbling after urination.  Discharge from the urethra. The urethra is a tube that opens at the end of the penis.  Pain in the:  Testicles.  Penis or tip of the penis.  Rectum.  Area in front of the rectum and below the scrotum (perineum).  Problems with sexual function.  Painful ejaculation.  Bloody semen. How is this diagnosed? This condition may be diagnosed based on:  A physical and medical exam.  Your symptoms.  A urine test to check for bacteria.  An exam in which a health care provider uses a finger to feel the prostate (digital rectal exam).  A test of a sample of semen.  Blood tests.  Ultrasound.  Removal of prostate tissue to be examined under a microscope (biopsy).  Tests to check how your body handles urine (urodynamic tests).  A test to look inside your bladder or urethra (cystoscopy). How is this treated? Treatment for this condition depends on the type of prostatitis. Treatment may involve:  Medicines to relieve pain or inflammation.  Medicines to help relax your muscles.  Physical therapy.  Heat therapy.  Techniques to help you control certain body functions (biofeedback).  Relaxation exercises.  Antibiotic medicine, if your condition is caused by bacteria.  Warm water baths (sitz baths). Sitz baths help with relaxing your pelvic floor muscles, which helps to relieve pressure on the prostate. Follow   these instructions at home:  Take over-the-counter and prescription medicines only as told by your health care provider.  If you were prescribed an antibiotic, take it as told by your health care provider. Do not stop taking the  antibiotic even if you start to feel better.  If physical therapy, biofeedback, or relaxation exercises were prescribed, do exercises as instructed.  Take sitz baths as directed by your health care provider. For a sitz bath, sit in warm water that is deep enough to cover your hips and buttocks.  Keep all follow-up visits as told by your health care provider. This is important. Contact a health care provider if:  Your symptoms get worse.  You have a fever. Get help right away if:  You have chills.  You feel nauseous.  You vomit.  You feel light-headed or feel like you are going to faint.  You are unable to urinate.  You have blood or blood clots in your urine. This information is not intended to replace advice given to you by your health care provider. Make sure you discuss any questions you have with your health care provider. Document Released: 03/13/2000 Document Revised: 12/05/2015 Document Reviewed: 12/05/2015 Elsevier Interactive Patient Education  2017 [place] Introduction A hernia happens when an organ or tissue inside your body pushes out through a weak spot in the belly (abdomen). Follow these instructions at home:  Avoid stretching or overusing (straining) the muscles near the hernia.  Do not lift anything heavier than 10 lb (4.5 kg).  Use the muscles in your leg when you lift something up. Do not use the muscles in your back.  When you cough, try to cough gently.  Eat a diet that has a lot of fiber. Eat lots of fruits and vegetables.  Drink enough fluids to keep your pee (urine) clear or pale yellow. Try to drink 6-8 glasses of water a day.  Take medicines to make your poop soft (stool softeners) as told by your doctor.  Lose weight, if you are overweight.  Do not use any tobacco products, including cigarettes, chewing tobacco, or electronic cigarettes. If you need help quitting, ask your doctor.  Keep all follow-up visits as told by  your doctor. This is important. Contact a doctor if:  The skin by the hernia gets puffy (swollen) or red.  The hernia is painful. Get help right away if:  You have a fever.  You have belly pain that is getting worse.  You feel sick to your stomach (nauseous) or you throw up (vomit).  You cannot push the hernia back in place by gently pressing on it while you are lying down.  The hernia:  Changes in shape or size.  Is stuck outside your belly.  Changes color.  Feels hard or tender. This information is not intended to replace advice given to you by your health care provider. Make sure you discuss any questions you have with your health care provider. Document Released: 09/03/2009 Document Revised: 08/22/2015 Document Reviewed: 01/24/2014  2017 Elsevier

## 2016-03-07 NOTE — Progress Notes (Signed)
Patient ID: Jacob [name], male    DOB: January 13, 1966  Age: 50 y.o. MRN: [id number]    Subjective:  Subjective  HPI Jacob [name] presents for urinary frequency -- this is his 3rd visit .   He is up 4-5 x a night.  Some pain in groin with dysuria / pain with urination.  He was told a few years ago he had a big prostate but it has not been examined since , he has had psa checked only -- they have normal.  Symptoms x 1 1/2 months.    Review of Systems  Constitutional: Negative for appetite change, diaphoresis, fatigue and unexpected weight change.  Eyes: Negative for pain, redness and visual disturbance.  Respiratory: Negative for cough, chest tightness, shortness of breath and wheezing.   Cardiovascular: Negative for chest pain, palpitations and leg swelling.  Endocrine: Negative for cold intolerance, heat intolerance, polydipsia, polyphagia and polyuria.  Genitourinary: Positive for dysuria, frequency, scrotal swelling and testicular pain. Negative for difficulty urinating, discharge, genital sores, penile pain and penile swelling.  Neurological: Negative for dizziness, light-headedness, numbness and headaches.    History Past Medical History:  Diagnosis Date  . Allergy   . Anxiety   . Cholesteatoma of right ear   . 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 GERD (gastroesophageal reflux disease)   . Hyperlipidemia 10/18/2014  . Hypertension     He has a past surgical history that includes Lumbar disc surgery; Inner ear surgery; Cholecystectomy; tubes in bil ears; and Hypospadius correction (N/A, 1978).   His family history includes COPD in his mother; Diabetes in his maternal grandmother and paternal grandmother; Heart disease in his father; Hypertension in his mother; Lung cancer in his maternal uncle.He reports that he has never smoked. He has never used smokeless tobacco. He reports that he drinks about 3.0 oz of alcohol per week . He reports that he does not use drugs.  Current  Outpatient Prescriptions on File Prior to Visit  Medication Sig Dispense Refill  . ALPRAZolam (XANAX) 0.5 MG tablet TAKE ONE TO TWO TABLETS BY MOUTH AT BEDTIME AS NEEDED 60 tablet 5  . aspirin EC 81 MG tablet Take 1 tablet (81 mg total) by mouth daily. 90 tablet 11  . cetirizine (ZYRTEC) 10 MG tablet Take 1 tablet (10 mg total) by mouth daily. 30 tablet 11  . Diclofenac Sodium 2 % SOLN Apply 1 pump twice daily. 112 g 3  . metFORMIN (GLUCOPHAGE-XR) 500 MG 24 hr tablet Take 3 tablets (1,500 mg total) by mouth daily with breakfast. 270 tablet 3  . Multiple Vitamin (MULTIVITAMIN WITH MINERALS) TABS tablet Take 1 tablet by mouth daily.    . Omega-3 Fatty Acids (FISH OIL PO) Take 1 capsule by mouth daily.    . pantoprazole (PROTONIX) 40 MG tablet Take 1 tablet (40 mg total) by mouth daily. 90 tablet 3  . ramipril (ALTACE) 10 MG capsule TAKE ONE CAPSULE BY MOUTH ONCE DAILY 90 capsule 1  . sildenafil (REVATIO) 20 MG tablet Take 3-5 tabs by mouth as needed 60 tablet 2  . triamcinolone (NASACORT AQ) 55 MCG/ACT AERO nasal inhaler Place 2 sprays into the nose daily. (Patient not taking: Reported on 03/07/2016) 1 Inhaler 12   No current facility-administered medications on file prior to visit.      Objective:  Objective  Physical Exam  Abdominal: Soft. Bowel sounds are normal. He exhibits no distension and no mass. There is no tenderness. There is  no rebound and no guarding. A hernia is present. Hernia confirmed positive in the right inguinal area.  Genitourinary: Prostate is enlarged and tender. Right testis shows tenderness. No penile tenderness.  Lymphadenopathy:       Right: No inguinal adenopathy present.  Nursing note and vitals reviewed.  BP 140/90 (BP Location: Left Arm, Patient Position: Sitting, Cuff Size: Normal)   Pulse 84   Ht 5\' 11"  (1.803 m)   Wt 196 lb (88.9 kg)   SpO2 98%   BMI 27.34 kg/m  Wt Readings from Last 3 Encounters:  03/07/16 196 lb (88.9 kg)  02/13/16 201 lb (91.2 kg)    02/05/16 200 lb (90.7 kg)     Lab Results  Component Value Date   WBC 6.0 09/26/2015   HGB 15.0 09/26/2015   HCT 42.3 09/26/2015   PLT 193 09/26/2015   GLUCOSE 177 (H) 09/26/2015   CHOL 239 (H) 08/29/2015   TRIG 109.0 08/29/2015   HDL 48.40 08/29/2015   LDLDIRECT 144.0 10/18/2014   LDLCALC 169 (H) 08/29/2015   ALT 62 (H) 08/29/2015   AST 45 (H) 08/29/2015   NA 137 09/26/2015   K 3.9 09/26/2015   CL 106 09/26/2015   CREATININE 1.10 09/26/2015   BUN 15 09/26/2015   CO2 24 09/26/2015   TSH 1.58 08/29/2015   PSA 0.85 08/29/2015   INR 1.0 01/18/2007   HGBA1C 7.2 (H) 08/29/2015   MICROALBUR 1.1 08/29/2015    No results found.   Assessment & Plan:  Plan  I have discontinued Mr. Paumen phenazopyridine. I am also having him start on ciprofloxacin and tamsulosin. Additionally, I am having him maintain his aspirin EC, Diclofenac Sodium, Omega-3 Fatty Acids (FISH OIL PO), multivitamin with minerals, cetirizine, sildenafil, ramipril, ALPRAZolam, triamcinolone, pantoprazole, and metFORMIN.  Meds ordered this encounter  Medications  . ciprofloxacin (CIPRO) 500 MG tablet    Sig: Take 1 tablet (500 mg total) by mouth 2 (two) times daily.    Dispense:  28 tablet    Refill:  0  . tamsulosin (FLOMAX) 0.4 MG CAPS capsule    Sig: Take 1 capsule (0.4 mg total) by mouth daily.    Dispense:  30 capsule    Refill:  3    Problem List Items Addressed This Visit    None    Visit Diagnoses    Dysuria    -  Primary   Relevant Orders   POCT Urinalysis Dipstick (Automated) (Completed)   Acute prostatitis       Relevant Medications   ciprofloxacin (CIPRO) 500 MG tablet   BPH associated with nocturia       Relevant Medications   tamsulosin (FLOMAX) 0.4 MG CAPS capsule   Inguinal hernia without obstruction or gangrene, recurrence not specified, unspecified laterality       Relevant Orders   Ambulatory referral to General Surgery    pt instructed to go to ER if pain worsened-- referral  put in for surgeon for hernia  If frequency  With urination cont-- consider urology  Follow-up: Return if symptoms worsen or fail to improve.  Ann Held, DO

## 2016-03-07 NOTE — Progress Notes (Signed)
Pre visit review using our clinic review tool, if applicable. No additional management support is needed unless otherwise documented below in the visit note. 

## 2016-03-16 ENCOUNTER — Telehealth: Payer: Self-pay | Admitting: Internal Medicine

## 2016-03-16 DIAGNOSIS — R339 Retention of urine, unspecified: Secondary | ICD-10-CM

## 2016-03-16 DIAGNOSIS — N401 Enlarged prostate with lower urinary tract symptoms: Secondary | ICD-10-CM

## 2016-03-16 NOTE — Telephone Encounter (Signed)
Ok for urgent referral urology for BPH with urinary retention type symptoms, which is what I believe he is referring to getting worse  Please let pt know that he should go to ER for any marked slowing or unable to urinate, or worsening pain or fever

## 2016-03-16 NOTE — Telephone Encounter (Signed)
Patient is requesting referral to urologist for prostate issue.  States his condition has gotten worse. Requesting urgent referral.

## 2016-03-16 NOTE — Telephone Encounter (Signed)
Notified patient of MD response.  Patient did states he has appt January 3rd.

## 2016-03-31 ENCOUNTER — Encounter (HOSPITAL_COMMUNITY): Payer: Self-pay | Admitting: Emergency Medicine

## 2016-03-31 DIAGNOSIS — K4090 Unilateral inguinal hernia, without obstruction or gangrene, not specified as recurrent: Secondary | ICD-10-CM | POA: Diagnosis not present

## 2016-03-31 DIAGNOSIS — Z7982 Long term (current) use of aspirin: Secondary | ICD-10-CM | POA: Insufficient documentation

## 2016-03-31 DIAGNOSIS — I10 Essential (primary) hypertension: Secondary | ICD-10-CM | POA: Diagnosis not present

## 2016-03-31 DIAGNOSIS — R1030 Lower abdominal pain, unspecified: Secondary | ICD-10-CM | POA: Diagnosis present

## 2016-03-31 DIAGNOSIS — R1110 Vomiting, unspecified: Secondary | ICD-10-CM | POA: Insufficient documentation

## 2016-03-31 LAB — COMPREHENSIVE METABOLIC PANEL
ALT: 33 U/L (ref 17–63)
ANION GAP: 7 (ref 5–15)
AST: 25 U/L (ref 15–41)
Albumin: 4.6 g/dL (ref 3.5–5.0)
Alkaline Phosphatase: 80 U/L (ref 38–126)
BUN: 15 mg/dL (ref 6–20)
CALCIUM: 9.2 mg/dL (ref 8.9–10.3)
CHLORIDE: 104 mmol/L (ref 101–111)
CO2: 25 mmol/L (ref 22–32)
Creatinine, Ser: 1.17 mg/dL (ref 0.61–1.24)
GFR calc Af Amer: >60 mL/min (ref >60)
GFR calc non Af Amer: >60 mL/min (ref >60)
Glucose, Bld: 161 mg/dL — ABNORMAL HIGH (ref 65–99)
Potassium: 4.5 mmol/L (ref 3.5–5.1)
SODIUM: 136 mmol/L (ref 135–145)
Total Bilirubin: 1 mg/dL (ref 0.3–1.2)
Total Protein: 7.7 g/dL (ref 6.5–8.1)

## 2016-03-31 LAB — URINALYSIS, ROUTINE W REFLEX MICROSCOPIC
Bilirubin Urine: NEGATIVE
Glucose, UA: 50 mg/dL — AB
HGB URINE DIPSTICK: NEGATIVE
Ketones, ur: NEGATIVE mg/dL
LEUKOCYTES UA: NEGATIVE
Nitrite: NEGATIVE
Protein, ur: NEGATIVE mg/dL
SPECIFIC GRAVITY, URINE: 1.02 (ref 1.005–1.030)
pH: 6 (ref 5.0–8.0)

## 2016-03-31 LAB — CBC
HCT: 44.7 % (ref 39.0–52.0)
HEMOGLOBIN: 15.5 g/dL (ref 13.0–17.0)
MCH: 29.8 pg (ref 26.0–34.0)
MCHC: 34.7 g/dL (ref 30.0–36.0)
MCV: 86 fL (ref 78.0–100.0)
Platelets: 208 10*3/uL (ref 150–400)
RBC: 5.2 MIL/uL (ref 4.22–5.81)
RDW: 12.8 % (ref 11.5–15.5)
WBC: 9.3 10*3/uL (ref 4.0–10.5)

## 2016-03-31 LAB — LIPASE, BLOOD: LIPASE: 30 U/L (ref 11–51)

## 2016-03-31 MED ORDER — ONDANSETRON 4 MG PO TBDP
4.0000 mg | ORAL_TABLET | Freq: Once | ORAL | Status: AC | PRN
  Administered 2016-03-31: 4 mg via ORAL
  Filled 2016-03-31: qty 1

## 2016-03-31 NOTE — ED Triage Notes (Addendum)
Pt presents with complaints of abdominal pain and nausea and vomiting.  Recently diagnosed with an enlarged prostate and inguinal hernia.  States it feels like his hernia is protruding out and it is causing him a lot of pain.  States he had intercourse this weekend and was able to "finish but nothing came out".  Taking flomax and cipro recently.

## 2016-04-01 ENCOUNTER — Emergency Department (HOSPITAL_COMMUNITY)
Admission: EM | Admit: 2016-04-01 | Discharge: 2016-04-01 | Disposition: A | Discharge status: Home / Self Care | Payer: BLUE CROSS/BLUE SHIELD | Attending: Emergency Medicine | Admitting: Emergency Medicine

## 2016-04-01 DIAGNOSIS — R1110 Vomiting, unspecified: Secondary | ICD-10-CM

## 2016-04-01 DIAGNOSIS — K4090 Unilateral inguinal hernia, without obstruction or gangrene, not specified as recurrent: Secondary | ICD-10-CM

## 2016-04-01 HISTORY — DX: Benign prostatic hyperplasia without lower urinary tract symptoms: N40.0

## 2016-04-01 HISTORY — DX: Ventral hernia without obstruction or gangrene: K43.9

## 2016-04-01 MED ORDER — ONDANSETRON 4 MG PO TBDP: 4.0000 mg | ORAL_TABLET | Freq: Three times a day (TID) | ORAL | 0 refills | Status: DC | PRN

## 2016-04-01 MED ORDER — ONDANSETRON 4 MG PO TBDP
4.0000 mg | ORAL_TABLET | Freq: Once | ORAL | Status: AC
  Administered 2016-04-01: 4 mg via ORAL
  Filled 2016-04-01: qty 1

## 2016-04-01 NOTE — ED Provider Notes (Signed)
[facility] DEPT Provider Note   CSN: [id number] Arrival date & time: 03/31/16  2002   By signing my name below, I, Macon Large, attest that this documentation has been prepared under the direction and in the presence of Sherwood Gambler, MD. Electronically Signed: Macon Large, ED Scribe. 04/01/16. 12:36 AM.  History   Chief Complaint Chief Complaint  Patient presents with  . Abdominal Pain  . Emesis   The history is provided by the patient. No language interpreter was used.  Emesis   Associated symptoms include abdominal pain. Pertinent negatives include no diarrhea and no fever.   HPI Comments: Jacob [name] is a 51 y.o. male with PMHx of GERD, DM, essential HTN, hyperlipidemia who presents to the Emergency Department complaining of gradually worsening, intermittent, lower abdominal pain onset three months ago. He reports associated nausea with episodic emesis. Pt states he had ~3 episodes of vomiting today. He states his abdominal pain is worsened with vomiting. Pt states he took 4 ibuprofen for his abdominal pain with good relief. Pt notes nausea medication given in ED also provided goodl relief. Per pt, he was previously diagnosed with an enlarged prostate and abdominal wall hernia. He notes his hernia was protruding out earlier and was causing him severe pain, but states he "pushed" it back in. Pt notes he was having to use the restroom constantly due to increased bowel movements onset four days ago but has now sustained. He took imodium once. Lat BM yesterday. Pt also notes he has been having testicular pain. Per nurse note, pt had intercourse two days ago and notes he was "able to finish but nothing came out". He states he has a GU f/u appointment tomorrow. Pt states he is currently Ciprofloxacin and Flomax. Denies fever, diarrhea, constipation, dysuria, hematuria.    Past Medical History:  Diagnosis Date  . Allergy   . Anxiety   . Cholesteatoma of right ear   .  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 Enlarged prostate   . GERD (gastroesophageal reflux disease)   . Hernia of abdominal wall   . Hyperlipidemia 10/18/2014  . Hypertension     Patient Active Problem List   Diagnosis Date Noted  . Urinary frequency 02/05/2016  . Syncope 09/27/2015  . Left lateral epicondylitis 08/31/2015  . Allergic rhinitis 08/29/2015  . Bilateral hearing loss 06/05/2015  . Hypersomnolence 06/05/2015  . Chest pain 01/23/2015  . Chronic pain syndrome 01/23/2015  . Hyperlipidemia 10/18/2014  . Impingement syndrome of right shoulder 06/28/2014  . Hip flexor tendon tightness 06/28/2014  . Right knee pain 06/28/2014  . Depression with anxiety 01/30/2013  . Diabetes mellitus without complication ([place]) 0000000  . Encounter for preventative adult health care exam with abnormal findings 01/11/2013  . Essential hypertension, benign 01/11/2013  . Low back pain syndrome 01/11/2013  . DJD (degenerative joint disease) 01/11/2013  . Erectile dysfunction 01/11/2013  . GERD (gastroesophageal reflux disease) 12/30/2011    Past Surgical History:  Procedure Laterality Date  . CHOLECYSTECTOMY    . [place]  . INNER EAR SURGERY     right  . LUMBAR DISC SURGERY    . tubes in bil ears         Home Medications    Prior to Admission medications   Medication Sig Start Date End Date Taking? Authorizing Provider  ALPRAZolam Duanne Moron) 0.5 MG tablet TAKE ONE TO TWO TABLETS BY MOUTH AT BEDTIME AS NEEDED 12/12/15   Biagio Borg,  MD  aspirin EC 81 MG tablet Take 1 tablet (81 mg total) by mouth daily. 05/09/14   Biagio Borg, MD  cetirizine (ZYRTEC) 10 MG tablet Take 1 tablet (10 mg total) by mouth daily. 10/08/15   Biagio Borg, MD  ciprofloxacin (CIPRO) 500 MG tablet Take 1 tablet (500 mg total) by mouth 2 (two) times daily. 03/07/16   Rosalita Chessman Chase, DO  Diclofenac Sodium 2 % SOLN Apply 1 pump twice daily. 01/23/15   Biagio Borg, MD  metFORMIN  (GLUCOPHAGE-XR) 500 MG 24 hr tablet Take 3 tablets (1,500 mg total) by mouth daily with breakfast. 02/05/16   Biagio Borg, MD  Multiple Vitamin (MULTIVITAMIN WITH MINERALS) TABS tablet Take 1 tablet by mouth daily.    Historical Provider, MD  Omega-3 Fatty Acids (FISH OIL PO) Take 1 capsule by mouth daily.    Historical Provider, MD  ondansetron (ZOFRAN ODT) 4 MG disintegrating tablet Take 1 tablet (4 mg total) by mouth every 8 (eight) hours as needed for nausea or vomiting. 04/01/16   Sherwood Gambler, MD  pantoprazole (PROTONIX) 40 MG tablet Take 1 tablet (40 mg total) by mouth daily. 02/05/16   Biagio Borg, MD  ramipril (ALTACE) 10 MG capsule TAKE ONE CAPSULE BY MOUTH ONCE DAILY 12/11/15   Biagio Borg, MD  sildenafil (REVATIO) 20 MG tablet Take 3-5 tabs by mouth as needed 10/08/15   Biagio Borg, MD  tamsulosin (FLOMAX) 0.4 MG CAPS capsule Take 1 capsule (0.4 mg total) by mouth daily. 03/07/16   Rosalita Chessman Chase, DO  triamcinolone (NASACORT AQ) 55 MCG/ACT AERO nasal inhaler Place 2 sprays into the nose daily. Patient not taking: Reported on 03/07/2016 02/05/16   Biagio Borg, MD    Family History Family History  Problem Relation Age of Onset  . COPD Mother   . Hypertension Mother   . Heart disease Father   . Diabetes Paternal Grandmother   . Diabetes Maternal Grandmother   . Lung cancer Maternal Uncle   . Colon cancer Neg Hx   . Esophageal cancer Neg Hx   . Rectal cancer Neg Hx   . Stomach cancer Neg Hx     Social History Social History  Substance Use Topics  . Smoking status: Never Smoker  . Smokeless tobacco: Never Used  . Alcohol use 3.0 oz/week    5 Glasses of wine per week     Comment: social     Allergies   Patient has no known allergies.   Review of Systems Review of Systems  Constitutional: Negative for fever.  Gastrointestinal: Positive for abdominal pain, nausea and vomiting. Negative for constipation and diarrhea.  Genitourinary: Positive for testicular pain.  Negative for dysuria and hematuria.  All other systems reviewed and are negative.    Physical Exam Updated Vital Signs BP 139/85 (BP Location: Left Arm)   Pulse 90   Temp 99.9 F (37.7 C) (Oral)   Resp 18   Ht 5\' 11"  (1.803 m)   Wt 196 lb (88.9 kg)   SpO2 98%   BMI 27.34 kg/m   Physical Exam  Constitutional: He is oriented to person, place, and time. He appears well-developed and well-nourished.  HENT:  Head: Normocephalic and atraumatic.  Right Ear: External ear normal.  Left Ear: External ear normal.  Nose: Nose normal.  Eyes: Right eye exhibits no discharge. Left eye exhibits no discharge.  Neck: Neck supple.  Cardiovascular: Normal rate, regular rhythm and normal  heart sounds.   Pulmonary/Chest: Effort normal and breath sounds normal.  Abdominal: Soft. There is no tenderness.  Genitourinary:  Genitourinary Comments: Mild left inguinal hernia defect without distension or incarceration. Mild left testicular tenderness without swelling or redness.  Musculoskeletal: He exhibits no edema.  Neurological: He is alert and oriented to person, place, and time.  Skin: Skin is warm and dry.  Nursing note and vitals reviewed.    ED Treatments / Results   DIAGNOSTIC STUDIES: Oxygen Saturation is 98% on RA, normal by my interpretation.    COORDINATION OF CARE: 12:36 AM Discussed treatment plan with pt at bedside which includes labs and nausea medication and pt agreed to plan.   Labs (all labs ordered are listed, but only abnormal results are displayed) Labs Reviewed  COMPREHENSIVE METABOLIC PANEL - Abnormal; Notable for the following:       Result Value   Glucose, Bld 161 (*)    All other components within normal limits  URINALYSIS, ROUTINE W REFLEX MICROSCOPIC - Abnormal; Notable for the following:    Glucose, UA 50 (*)    All other components within normal limits  LIPASE, BLOOD  CBC    EKG  EKG Interpretation None       Radiology No results  found.  Procedures Procedures (including critical care time)  Medications Ordered in ED Medications  ondansetron (ZOFRAN-ODT) disintegrating tablet 4 mg (not administered)  ondansetron (ZOFRAN-ODT) disintegrating tablet 4 mg (4 mg Oral Given 03/31/16 2040)     Initial Impression / Assessment and Plan / ED Course  I have reviewed the triage vital signs and the nursing notes.  Pertinent labs & imaging results that were available during my care of the patient were reviewed by me and considered in my medical decision making (see chart for details).  Clinical Course     Patient's vomiting might be secondary due to the pain he had this afternoon or from a possible GI illness given recent diarrhea. Abd exam benign. Has tenderness in left inguinal canal but no protruding or incarcerated hernia. GU exam with mild tenderness but no signs of acute infection. The left testicle pain has been ongoing for as long as he's had the hernia (months). Doubt torsion, epididymitis. Has Urology f/u tomorrow. Encourage f/u with [facility]. Has mild nausea now, requests zofran again prior to d/c. Continue nsaids for pain. Strict return precautions. Based on exam, no indication for imaging at this time. Highly doubt obstruction or abd emergency  Final Clinical Impressions(s) / ED Diagnoses   Final diagnoses:  Vomiting in adult  Left inguinal hernia    New Prescriptions New Prescriptions   ONDANSETRON (ZOFRAN ODT) 4 MG DISINTEGRATING TABLET    Take 1 tablet (4 mg total) by mouth every 8 (eight) hours as needed for nausea or vomiting.    I personally performed the services described in this documentation, which was scribed in my presence. The recorded information has been reviewed and is accurate.    Sherwood Gambler, MD 04/01/16 [id number]

## 2016-04-21 ENCOUNTER — Encounter: Payer: Self-pay | Admitting: Internal Medicine

## 2016-04-21 ENCOUNTER — Ambulatory Visit (INDEPENDENT_AMBULATORY_CARE_PROVIDER_SITE_OTHER): Payer: BLUE CROSS/BLUE SHIELD | Admitting: Internal Medicine

## 2016-04-21 VITALS — BP 124/74 | HR 81 | Temp 98.4°F | Resp 16 | Ht 71.0 in | Wt 196.0 lb

## 2016-04-21 DIAGNOSIS — M5126 Other intervertebral disc displacement, lumbar region: Secondary | ICD-10-CM | POA: Diagnosis not present

## 2016-04-21 DIAGNOSIS — M5440 Lumbago with sciatica, unspecified side: Secondary | ICD-10-CM | POA: Insufficient documentation

## 2016-04-21 MED ORDER — METHYLPREDNISOLONE 4 MG PO TBPK: ORAL_TABLET | ORAL | 0 refills | Status: DC

## 2016-04-21 NOTE — Patient Instructions (Signed)

## 2016-04-21 NOTE — Progress Notes (Signed)
Pre visit review using our clinic review tool, if applicable. No additional management support is needed unless otherwise documented below in the visit note. 

## 2016-04-21 NOTE — Progress Notes (Signed)
Subjective:  Patient ID: Jacob [name], male    DOB: Oct 02, 1965  Age: 51 y.o. MRN: [id number]  CC: Back Pain   HPI RISHAAN RODKEY [name] presents for The acute onset of right-sided low back pain that started earlier today after he was doing some heavy lifting. He complains of a dull achy pain that radiates into his right thigh and some numbness over the back of his right thigh. He wants a work note because he feels like he is in too much pain to go back to work. He describes having a microdiscectomy about 20 years ago. He has gotten some symptom relief by taking ibuprofen today. Denies any weakness or tingling in his legs or foot drop.  Outpatient Medications Prior to Visit  Medication Sig Dispense Refill  . ALPRAZolam (XANAX) 0.5 MG tablet TAKE ONE TO TWO TABLETS BY MOUTH AT BEDTIME AS NEEDED 60 tablet 5  . metFORMIN (GLUCOPHAGE-XR) 500 MG 24 hr tablet Take 3 tablets (1,500 mg total) by mouth daily with breakfast. 270 tablet 3  . Multiple Vitamin (MULTIVITAMIN WITH MINERALS) TABS tablet Take 1 tablet by mouth daily.    . Omega-3 Fatty Acids (FISH OIL PO) Take 1 capsule by mouth daily.    . ramipril (ALTACE) 10 MG capsule TAKE ONE CAPSULE BY MOUTH ONCE DAILY 90 capsule 1  . sildenafil (REVATIO) 20 MG tablet Take 3-5 tabs by mouth as needed 60 tablet 2  . aspirin EC 81 MG tablet Take 1 tablet (81 mg total) by mouth daily. (Patient not taking: Reported on 04/21/2016) 90 tablet 11  . cetirizine (ZYRTEC) 10 MG tablet Take 1 tablet (10 mg total) by mouth daily. (Patient not taking: Reported on 04/21/2016) 30 tablet 11  . Diclofenac Sodium 2 % SOLN Apply 1 pump twice daily. (Patient not taking: Reported on 04/21/2016) 112 g 3  . ondansetron (ZOFRAN ODT) 4 MG disintegrating tablet Take 1 tablet (4 mg total) by mouth every 8 (eight) hours as needed for nausea or vomiting. (Patient not taking: Reported on 04/21/2016) 8 tablet 0  . pantoprazole (PROTONIX) 40 MG tablet Take 1 tablet (40 mg total) by mouth daily.  (Patient not taking: Reported on 04/21/2016) 90 tablet 3  . tamsulosin (FLOMAX) 0.4 MG CAPS capsule Take 1 capsule (0.4 mg total) by mouth daily. (Patient not taking: Reported on 04/21/2016) 30 capsule 3  . triamcinolone (NASACORT AQ) 55 MCG/ACT AERO nasal inhaler Place 2 sprays into the nose daily. (Patient not taking: Reported on 03/07/2016) 1 Inhaler 12  . ciprofloxacin (CIPRO) 500 MG tablet Take 1 tablet (500 mg total) by mouth 2 (two) times daily. 28 tablet 0   No facility-administered medications prior to visit.     ROS Review of Systems  Constitutional: Negative.  Negative for chills, fatigue and fever.  HENT: Negative.   Eyes: Negative for visual disturbance.  Respiratory: Negative for cough, chest tightness, shortness of breath, wheezing and stridor.   Cardiovascular: Negative.  Negative for chest pain, palpitations and leg swelling.  Gastrointestinal: Negative for abdominal pain, constipation, diarrhea, nausea and vomiting.  Endocrine: Negative.   Genitourinary: Negative.  Negative for difficulty urinating, dysuria and urgency.  Musculoskeletal: Positive for back pain. Negative for arthralgias, joint swelling and myalgias.  Skin: Negative.   Allergic/Immunologic: Negative.   Neurological: Positive for numbness. Negative for dizziness, weakness and headaches.  Hematological: Negative.  Negative for adenopathy. Does not bruise/bleed easily.  Psychiatric/Behavioral: Negative.     Objective:  BP 124/74 (BP Location: Left  Arm, Patient Position: Sitting, Cuff Size: Normal)   Pulse 81   Temp 98.4 F (36.9 C)   Resp 16   Ht 5\' 11"  (1.803 m)   Wt 196 lb (88.9 kg)   SpO2 98%   BMI 27.34 kg/m   BP Readings from Last 3 Encounters:  04/21/16 124/74  04/01/16 136/80  03/07/16 140/90    Wt Readings from Last 3 Encounters:  04/21/16 196 lb (88.9 kg)  03/31/16 196 lb (88.9 kg)  03/07/16 196 lb (88.9 kg)    Physical Exam  Constitutional: He is oriented to person, place, and  time. No distress.  HENT:  Mouth/Throat: Oropharynx is clear and moist. No oropharyngeal exudate.  Eyes: Conjunctivae are normal. Right eye exhibits no discharge. Left eye exhibits no discharge. No scleral icterus.  Neck: Normal range of motion. Neck supple. No JVD present. No tracheal deviation present. No thyromegaly present.  Cardiovascular: Normal rate, regular rhythm, normal heart sounds and intact distal pulses.  Exam reveals no gallop and no friction rub.   No murmur heard. Pulmonary/Chest: Effort normal and breath sounds normal. No stridor. No respiratory distress. He has no wheezes. He has no rales. He exhibits no tenderness.  Abdominal: Soft. Bowel sounds are normal. He exhibits no distension and no mass. There is no tenderness. There is no rebound and no guarding.  Musculoskeletal: Normal range of motion. He exhibits no edema, tenderness or deformity.       Lumbar back: Normal. He exhibits normal range of motion, no tenderness, no bony tenderness, no swelling, no edema, no deformity, no pain and no spasm.  Lymphadenopathy:    He has no cervical adenopathy.  Neurological: He is oriented to person, place, and time. He has normal strength. He displays no atrophy, no tremor and normal reflexes. No cranial nerve deficit or sensory deficit. He exhibits normal muscle tone. He displays a negative Romberg sign. He displays no seizure activity. Coordination and gait normal. He displays no Babinski's sign on the right side. He displays no Babinski's sign on the left side.  Reflex Scores:      Tricep reflexes are 0 on the right side and 0 on the left side.      Bicep reflexes are 0 on the right side and 0 on the left side.      Brachioradialis reflexes are 0 on the right side and 0 on the left side.      Patellar reflexes are 1+ on the right side and 1+ on the left side.      Achilles reflexes are 0 on the right side and 1+ on the left side. + SLR in RLE - SLR in LLE  Skin: Skin is warm and dry.  No rash noted. He is not diaphoretic. No erythema. No pallor.  Psychiatric: He has a normal mood and affect. His behavior is normal. Judgment and thought content normal.  Vitals reviewed.   Lab Results  Component Value Date   WBC 9.3 03/31/2016   HGB 15.5 03/31/2016   HCT 44.7 03/31/2016   PLT 208 03/31/2016   GLUCOSE 161 (H) 03/31/2016   CHOL 239 (H) 08/29/2015   TRIG 109.0 08/29/2015   HDL 48.40 08/29/2015   LDLDIRECT 144.0 10/18/2014   LDLCALC 169 (H) 08/29/2015   ALT 33 03/31/2016   AST 25 03/31/2016   NA 136 03/31/2016   K 4.5 03/31/2016   CL 104 03/31/2016   CREATININE 1.17 03/31/2016   BUN 15 03/31/2016   CO2 25 03/31/2016  TSH 1.58 08/29/2015   PSA 0.85 08/29/2015   INR 1.0 01/18/2007   HGBA1C 7.2 (H) 08/29/2015   MICROALBUR 1.1 08/29/2015    No results found.  Assessment & Plan:   Slaten was seen today for back pain.  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 Pach ciprofloxacin. I am also having him start on methylPREDNISolone. Additionally, I am having him maintain his aspirin EC, Diclofenac Sodium, Omega-3 Fatty Acids (FISH OIL PO), multivitamin with minerals, cetirizine, sildenafil, ramipril, ALPRAZolam, triamcinolone, pantoprazole, metFORMIN, tamsulosin, and ondansetron.  Meds ordered this encounter  Medications  . methylPREDNISolone (MEDROL DOSEPAK) 4 MG TBPK tablet    Sig: TAKE AS DIRECTED    Dispense:  21 tablet    Refill:  0     Follow-up: No Follow-up on file.  Scarlette Calico, MD

## 2016-05-29 ENCOUNTER — Ambulatory Visit (INDEPENDENT_AMBULATORY_CARE_PROVIDER_SITE_OTHER): Payer: BLUE CROSS/BLUE SHIELD | Admitting: Internal Medicine

## 2016-05-29 ENCOUNTER — Encounter: Payer: Self-pay | Admitting: Internal Medicine

## 2016-05-29 VITALS — BP 142/86 | HR 72 | Temp 98.3°F | Ht 72.0 in | Wt 193.0 lb

## 2016-05-29 DIAGNOSIS — M25511 Pain in right shoulder: Secondary | ICD-10-CM

## 2016-05-29 DIAGNOSIS — R079 Chest pain, unspecified: Secondary | ICD-10-CM

## 2016-05-29 DIAGNOSIS — Z0001 Encounter for general adult medical examination with abnormal findings: Secondary | ICD-10-CM

## 2016-05-29 DIAGNOSIS — N401 Enlarged prostate with lower urinary tract symptoms: Secondary | ICD-10-CM

## 2016-05-29 DIAGNOSIS — E119 Type 2 diabetes mellitus without complications: Secondary | ICD-10-CM

## 2016-05-29 DIAGNOSIS — I10 Essential (primary) hypertension: Secondary | ICD-10-CM

## 2016-05-29 MED ORDER — CETIRIZINE HCL 10 MG PO TABS: 10.0000 mg | ORAL_TABLET | Freq: Every day | ORAL | 11 refills | Status: DC

## 2016-05-29 MED ORDER — METFORMIN HCL ER 500 MG PO TB24: 1500.0000 mg | ORAL_TABLET | Freq: Every day | ORAL | 3 refills | Status: DC

## 2016-05-29 MED ORDER — RAMIPRIL 10 MG PO CAPS: 10.0000 mg | ORAL_CAPSULE | Freq: Every day | ORAL | 3 refills | Status: DC

## 2016-05-29 MED ORDER — ALPRAZOLAM 0.5 MG PO TABS: 0.5000 mg | ORAL_TABLET | Freq: Every evening | ORAL | 5 refills | Status: DC | PRN

## 2016-05-29 MED ORDER — ALFUZOSIN HCL ER 10 MG PO TB24: 10.0000 mg | ORAL_TABLET | Freq: Every day | ORAL | 3 refills | Status: DC

## 2016-05-29 NOTE — Patient Instructions (Signed)
OK to increase the ramipril to twice per day  OK to change the flomax to Uroxatral  You will be contacted regarding the referral for: stress test and Dr Tamala Julian for the right shoulder  Please continue all other medications as before, and refills have been done if requested.  Please have the pharmacy call with any other refills you may need.  Please continue your efforts at being more active, low cholesterol diet, and weight control.  You are otherwise up to date with prevention measures today.  Please keep your appointments with your specialists as you may have planned  Please go to the LAB in the Basement (turn left off the elevator) for the tests to be done at your convenience  You will be contacted by phone if any changes need to be made immediately.  Otherwise, you will receive a letter about your results with an explanation, but please check with MyChart first.  Please remember to sign up for MyChart if you have not done so, as this will be important to you in the future with finding out test results, communicating by private email, and scheduling acute appointments online when needed.  Please return in 6 months, or sooner if needed, with Lab testing done 3-5 days before

## 2016-05-29 NOTE — Progress Notes (Signed)
Subjective:    Patient ID: Jacob [name], male    DOB: [birth year], 51 y.o.   MRN: [id number]  HPI   Here for wellness and f/u;  Overall doing ok;  Pt denies  worsening SOB, DOE, wheezing, orthopnea, PND, worsening LE edema, palpitations, dizziness or syncope.  Pt denies neurological change such as new headache, facial or extremity weakness.  Pt denies polydipsia, polyuria, or low sugar symptoms. Pt states overall good compliance with treatment and medications, good tolerability, and has been trying to follow appropriate diet.  Pt denies worsening depressive symptoms, suicidal ideation or panic. No fever, night sweats, wt loss, loss of appetite, or other constitutional symptoms.  Pt states good ability with ADL's, has low fall risk, home safety reviewed and adequate, no other significant changes in hearing or vision, and only occasionally active with exercise.  Does c/o left sided intermittent CP, sharp and dull, nonexertional, nonpleuritic, nonpositional, intermittent mild and recurring for several wks.  Last stress test > 2 yrs perpt.  Also with Increased urination recently but denies urinary symptoms such as dysuria, urgency, flank pain, hematuria or n/v, fever, chills. Also c/o inability ejaculate with the flomax, asks for change.  No worsening urinary flow or retention.  Also has c/o right shoulder pain x 2 mo, mild to mod, intermittent, worse to abduct above shoulder level, though overall has some improved chronic pain to lower back, knees and shoudlers with recent job change.  Also, Has called EMS twcie in 6 mo with panic attacks Denies worsening depressive symptoms, suicidal ideation,  has ongoing anxiety Past Medical History:  Diagnosis Date  . Allergy   . Anxiety   . Cholesteatoma of right ear   . 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 Enlarged prostate   . GERD (gastroesophageal reflux disease)   . Hernia of abdominal wall   . Hyperlipidemia 10/18/2014  . Hypertension    Past  Surgical History:  Procedure Laterality Date  . CHOLECYSTECTOMY    . [place]  . INNER EAR SURGERY     right  . LUMBAR DISC SURGERY    . tubes in bil ears      reports that he has never smoked. He has never used smokeless tobacco. He reports that he drinks about 3.0 oz of alcohol per week . He reports that he does not use drugs. family history includes COPD in his mother; Diabetes in his maternal grandmother and paternal grandmother; Heart disease in his father; Hypertension in his mother; Lung cancer in his maternal uncle. No Known Allergies Current Outpatient Prescriptions on File Prior to Visit  Medication Sig Dispense Refill  . aspirin EC 81 MG tablet Take 1 tablet (81 mg total) by mouth daily. 90 tablet 11  . Diclofenac Sodium 2 % SOLN Apply 1 pump twice daily. 112 g 3  . Multiple Vitamin (MULTIVITAMIN WITH MINERALS) TABS tablet Take 1 tablet by mouth daily.    . Omega-3 Fatty Acids (FISH OIL PO) Take 1 capsule by mouth daily.    . sildenafil (REVATIO) 20 MG tablet Take 3-5 tabs by mouth as needed 60 tablet 2   No current facility-administered medications on file prior to visit.    Review of Systems Constitutional: Negative for increased diaphoresis, or other activity, appetite or siginficant weight change other than noted HENT: Negative for worsening hearing loss, ear pain, facial swelling, mouth sores and neck stiffness.   Eyes: Negative for other worsening pain, redness or visual  disturbance.  Respiratory: Negative for choking or stridor Cardiovascular: Negative for other chest pain and palpitations.  Gastrointestinal: Negative for worsening diarrhea, blood in stool, or abdominal distention Genitourinary: Negative for hematuria, flank pain or change in urine volume.  Musculoskeletal: Negative for myalgias or other joint complaints.  Skin: Negative for other color change and wound or drainage.  Neurological: Negative for syncope and numbness. other than  noted Hematological: Negative for adenopathy. or other swelling Psychiatric/Behavioral: Negative for hallucinations, SI, self-injury, decreased concentration or other worsening agitation.  All other system neg per pt    Objective:   Physical Exam BP (!) 142/86   Pulse 72   Temp 98.3 F (36.8 C)   Ht 6' (1.829 m)   Wt 193 lb (87.5 kg)   SpO2 99%   BMI 26.18 kg/m  VS noted,  Constitutional: Pt is oriented to person, place, and time. Appears well-developed and well-nourished, in no significant distress Head: Normocephalic and atraumatic  Eyes: Conjunctivae and EOM are normal. Pupils are equal, round, and reactive to light Right Ear: External ear normal.  Left Ear: External ear normal Nose: Nose normal.  Mouth/Throat: Oropharynx is clear and moist  Neck: Normal range of motion. Neck supple. No JVD present. No tracheal deviation present or significant neck LA or mass Cardiovascular: Normal rate, regular rhythm, normal heart sounds and intact distal pulses.   Pulmonary/Chest: Effort normal and breath sounds without rales or wheezing  Abdominal: Soft. Bowel sounds are normal. NT. No HSM  Musculoskeletal: Normal range of motion. Exhibits no edema, right shoudler with tender and pain to right subacromial area worse with abduction Lymphadenopathy: Has no cervical adenopathy.  Neurological: Pt is alert and oriented to person, place, and time. Pt has normal reflexes. No cranial nerve deficit. Motor grossly intact Skin: Skin is warm and dry. No rash noted or new ulcers Psychiatric:  Has normal mood and affect. Behavior is normal.  No other new exam findings    Assessment & Plan:

## 2016-05-30 DIAGNOSIS — N400 Enlarged prostate without lower urinary tract symptoms: Secondary | ICD-10-CM | POA: Insufficient documentation

## 2016-05-30 DIAGNOSIS — M25511 Pain in right shoulder: Secondary | ICD-10-CM | POA: Insufficient documentation

## 2016-05-30 NOTE — Assessment & Plan Note (Signed)
stable overall by history and exam, recent data reviewed with pt, and pt to continue medical treatment as before,  to f/u any worsening symptoms or concerns Lab Results  Component Value Date   HGBA1C 7.2 (H) 08/29/2015

## 2016-05-30 NOTE — Assessment & Plan Note (Signed)
With inability sexual due to flomax, try change to uroxatrol asd,  to f/u any worsening symptoms or concerns

## 2016-05-30 NOTE — Assessment & Plan Note (Signed)
Mild uncontrolled, for increased ramipril to 10 bid, cont to monitor at home and next visit

## 2016-05-30 NOTE — Assessment & Plan Note (Addendum)
C/w impingement syndrome possible bursitis, for referral Dr Smith/[facility]port med  In addition to the time spent performing CPE, I spent an additional 25 minutes face to face,in which greater than 50% of this time was spent in counseling and coordination of care for patient's acute illness as documented.

## 2016-05-30 NOTE — Assessment & Plan Note (Signed)

## 2016-05-30 NOTE — Assessment & Plan Note (Signed)
Atypical, for stress test,  Cont same tx, to f/u any worsening symptoms or concerns

## 2016-06-03 ENCOUNTER — Telehealth (HOSPITAL_COMMUNITY): Payer: Self-pay | Admitting: Internal Medicine

## 2016-06-03 NOTE — Telephone Encounter (Signed)
Called pt and spoke with him to get him set up for a myoview test. He voiced that he needed a late afternoon appt and I expressed to him that our we did not have any late afternoon appts. I offered him 3/16 at 11:30am and he voiced that he could not do that time , and proceeded to say that we use to do them after 5pm. After explaining to him that in the recent years the schedule has changed to where we have not had appts after 3:30 he voiced that he was very busy and would call us back.   I then called Cora Daniels and informed her of the conversation and she voiced understanding and would handle the situation moving forward.

## 2016-06-04 ENCOUNTER — Other Ambulatory Visit (INDEPENDENT_AMBULATORY_CARE_PROVIDER_SITE_OTHER): Payer: BLUE CROSS/BLUE SHIELD

## 2016-06-04 DIAGNOSIS — Z0001 Encounter for general adult medical examination with abnormal findings: Secondary | ICD-10-CM | POA: Diagnosis not present

## 2016-06-04 DIAGNOSIS — E119 Type 2 diabetes mellitus without complications: Secondary | ICD-10-CM | POA: Diagnosis not present

## 2016-06-04 LAB — URINALYSIS, ROUTINE W REFLEX MICROSCOPIC
Bilirubin Urine: NEGATIVE
Hgb urine dipstick: NEGATIVE
Ketones, ur: NEGATIVE
Leukocytes, UA: NEGATIVE
Nitrite: NEGATIVE
PH: 6 (ref 5.0–8.0)
RBC / HPF: NONE SEEN (ref 0–?)
SPECIFIC GRAVITY, URINE: 1.025 (ref 1.000–1.030)
TOTAL PROTEIN, URINE-UPE24: NEGATIVE
UROBILINOGEN UA: 0.2 (ref 0.0–1.0)
Urine Glucose: NEGATIVE

## 2016-06-04 LAB — BASIC METABOLIC PANEL
BUN: 14 mg/dL (ref 6–23)
CO2: 28 meq/L (ref 19–32)
Calcium: 9.7 mg/dL (ref 8.4–10.5)
Chloride: 105 mEq/L (ref 96–112)
Creatinine, Ser: 1.21 mg/dL (ref 0.40–1.50)
GFR: 67.69 mL/min (ref >60.00)
GLUCOSE: 174 mg/dL — AB (ref 70–99)
POTASSIUM: 4.7 meq/L (ref 3.5–5.1)
Sodium: 140 mEq/L (ref 135–145)

## 2016-06-04 LAB — LIPID PANEL
CHOLESTEROL: 190 mg/dL (ref 0–200)
HDL: 41.2 mg/dL (ref >39.00)
LDL Cholesterol: 122 mg/dL — ABNORMAL HIGH (ref 0–99)
NonHDL: 148.83
Total CHOL/HDL Ratio: 5
Triglycerides: 136 mg/dL (ref 0.0–149.0)
VLDL: 27.2 mg/dL (ref 0.0–40.0)

## 2016-06-04 LAB — CBC WITH DIFFERENTIAL/PLATELET
BASOS ABS: 0 10*3/uL (ref 0.0–0.1)
BASOS PCT: 0.7 % (ref 0.0–3.0)
EOS ABS: 0.1 10*3/uL (ref 0.0–0.7)
Eosinophils Relative: 1.9 % (ref 0.0–5.0)
HEMATOCRIT: 46.5 % (ref 39.0–52.0)
HEMOGLOBIN: 15.9 g/dL (ref 13.0–17.0)
Lymphocytes Relative: 30 % (ref 12.0–46.0)
Lymphs Abs: 1.4 10*3/uL (ref 0.7–4.0)
MCHC: 34.2 g/dL (ref 30.0–36.0)
MCV: 87.2 fl (ref 78.0–100.0)
MONO ABS: 0.3 10*3/uL (ref 0.1–1.0)
Monocytes Relative: 6.5 % (ref 3.0–12.0)
Neutro Abs: 2.8 10*3/uL (ref 1.4–7.7)
Neutrophils Relative %: 60.9 % (ref 43.0–77.0)
PLATELETS: 232 10*3/uL (ref 150.0–400.0)
RBC: 5.33 Mil/uL (ref 4.22–5.81)
RDW: 13.3 % (ref 11.5–15.5)
WBC: 4.7 10*3/uL (ref 4.0–10.5)

## 2016-06-04 LAB — MICROALBUMIN / CREATININE URINE RATIO
CREATININE, U: 251.5 mg/dL
MICROALB/CREAT RATIO: 0.5 mg/g (ref 0.0–30.0)
Microalb, Ur: 1.2 mg/dL (ref 0.0–1.9)

## 2016-06-04 LAB — HEMOGLOBIN A1C: HEMOGLOBIN A1C: 6.8 % — AB (ref 4.6–6.5)

## 2016-06-04 LAB — HEPATIC FUNCTION PANEL
ALT: 27 U/L (ref 0–53)
AST: 21 U/L (ref 0–37)
Albumin: 4.4 g/dL (ref 3.5–5.2)
Alkaline Phosphatase: 73 U/L (ref 39–117)
BILIRUBIN TOTAL: 0.8 mg/dL (ref 0.2–1.2)
Bilirubin, Direct: 0.1 mg/dL (ref 0.0–0.3)
TOTAL PROTEIN: 7.1 g/dL (ref 6.0–8.3)

## 2016-06-04 LAB — PSA: PSA: 0.93 ng/mL (ref 0.10–4.00)

## 2016-06-04 LAB — TSH: TSH: 1.72 u[IU]/mL (ref 0.35–4.50)

## 2016-06-29 ENCOUNTER — Encounter (HOSPITAL_COMMUNITY): Payer: Self-pay

## 2016-06-30 ENCOUNTER — Telehealth (HOSPITAL_COMMUNITY): Payer: Self-pay | Admitting: *Deleted

## 2016-06-30 NOTE — Telephone Encounter (Signed)
Left message on voicemail per DPR in reference to upcoming appointment scheduled on 07/02/16 at 1000 with detailed instructions given per Myocardial Perfusion Study Information Sheet for the test. LM to arrive 15 minutes early, and that it is imperative to arrive on time for appointment to keep from having the test rescheduled. If you need to cancel or reschedule your appointment, please call the office within 24 hours of your appointment. Failure to do so may result in a cancellation of your appointment, and a $50 no show fee. Phone number given for call back for any questions.

## 2016-07-02 ENCOUNTER — Ambulatory Visit (HOSPITAL_COMMUNITY): Payer: BLUE CROSS/BLUE SHIELD | Attending: Cardiovascular Disease

## 2016-07-02 ENCOUNTER — Encounter (HOSPITAL_COMMUNITY): Payer: Self-pay

## 2016-07-02 DIAGNOSIS — R079 Chest pain, unspecified: Secondary | ICD-10-CM | POA: Diagnosis not present

## 2016-07-02 DIAGNOSIS — I2510 Atherosclerotic heart disease of native coronary artery without angina pectoris: Secondary | ICD-10-CM | POA: Diagnosis present

## 2016-07-02 DIAGNOSIS — I10 Essential (primary) hypertension: Secondary | ICD-10-CM | POA: Diagnosis not present

## 2016-07-02 LAB — MYOCARDIAL PERFUSION IMAGING
CHL CUP NUCLEAR SDS: 1
CHL CUP NUCLEAR SRS: 2
CHL CUP RESTING HR STRESS: 56 {beats}/min
CSEPED: 9 min
CSEPEDS: 0 s
CSEPEW: 10.1 METS
CSEPHR: 86 %
LHR: 0.22
LVDIAVOL: 114 mL (ref 62–150)
LVSYSVOL: 51 mL
MPHR: 171 {beats}/min
Peak HR: 148 {beats}/min
SSS: 3
TID: 0.97

## 2016-07-02 MED ORDER — TECHNETIUM TC 99M TETROFOSMIN IV KIT
10.9000 | PACK | Freq: Once | INTRAVENOUS | Status: AC | PRN
  Administered 2016-07-02: 10.9 via INTRAVENOUS
  Filled 2016-07-02: qty 11

## 2016-07-02 MED ORDER — TECHNETIUM TC 99M TETROFOSMIN IV KIT
32.8000 | PACK | Freq: Once | INTRAVENOUS | Status: AC | PRN
  Administered 2016-07-02: 32.8 via INTRAVENOUS
  Filled 2016-07-02: qty 33

## 2016-07-03 ENCOUNTER — Telehealth: Payer: Self-pay | Admitting: Internal Medicine

## 2016-07-03 MED ORDER — RAMIPRIL 10 MG PO CAPS
10.0000 mg | ORAL_CAPSULE | Freq: Two times a day (BID) | ORAL | 3 refills | Status: DC
Start: 2016-07-03 — End: 2017-05-13

## 2016-07-03 NOTE — Telephone Encounter (Addendum)
Please advise regarding medication. I called pt he stated that he seen the results on MyChart but wanted a better understanding of what they meant. I explained that I didn't have access to those results yet and what the case may be is that PCP has them but they were ot released to me yet. I told pt that I would be in contact with him today regarding med and results.

## 2016-07-03 NOTE — Telephone Encounter (Signed)
Pt called regarding his stress test that he had done yesterday. He said that he got the results on MyChart but he was some questions about it. Jacob [name] He also said that the prescription for his Ramipril 10mg  should be prescribed for 2 a day. Can this be resent with those instructions? Please advise

## 2016-07-03 NOTE — Addendum Note (Signed)
Addended by: Biagio Borg on: 07/03/2016 02:38 PM   Modules accepted: Orders

## 2016-07-03 NOTE — Telephone Encounter (Addendum)
Not sure about what his concerns were. Do I need to call him with what Dr Jenny Reichmann said or have you spoke with him?

## 2016-07-03 NOTE — Telephone Encounter (Signed)
Pt requested refill on Ramipril 10mg  for 2 day. Can this be sent?

## 2016-07-03 NOTE — Telephone Encounter (Signed)
Dr. Jenny Reichmann what about the new script for Ramipril to take 2 a day. Can that be sent to the pharmacy?

## 2016-07-03 NOTE — Telephone Encounter (Signed)
Very sorry, but I cannot do phone consultations.  Information was given regarding the results. And any further questions can be answered at next visit, unless he has a specific concern now, thanks

## 2016-07-03 NOTE — Telephone Encounter (Signed)
Informed the pt that the medication was sent. He also scheduled an appointment to go over the stress test results with Dr Jenny Reichmann.

## 2016-07-03 NOTE — Telephone Encounter (Signed)
[facility] - I sent the ramipril rx =- done erx

## 2016-07-03 NOTE — Telephone Encounter (Signed)
Pt called in and would like someone to call him about refills an stress test results

## 2016-07-03 NOTE — Telephone Encounter (Signed)
I have not spoken to the pt, however, Dr. Jenny Reichmann said they have already discussed results so he will have to make an appt per his note.

## 2016-07-08 NOTE — Telephone Encounter (Signed)
Per Dr. Judi Cong note below pt has already been given results and Dr. Jenny Reichmann would like to refrain from doing a phone consultation. As he was instructed to make an appt any other concerns that was not brought up during his OV will be discussed then.

## 2016-07-08 NOTE — Telephone Encounter (Signed)
Spoke with Jonelle Sidle about the results from the test and she explained to me that everything was normal based on the results that were given. I called the pt and read the results to him. He said that was all he needed to know and asked to cancel his upcoming appointment. I explained to him that if he had any other questions regarding the test that he should make an appointment to speak with Dr Jenny Reichmann.

## 2016-07-08 NOTE — Telephone Encounter (Signed)
Pt asked if you could call him. He has an appointment to go over his stress test results but he would like some clarification. He said that [place] go [place] and just wanted to make sure that he does not have any blockages.

## 2016-07-08 NOTE — Telephone Encounter (Signed)
Pt does have an appointment scheduled on 07/16/16.

## 2016-07-09 ENCOUNTER — Ambulatory Visit: Payer: Self-pay | Admitting: Internal Medicine

## 2016-07-16 ENCOUNTER — Ambulatory Visit: Payer: Self-pay | Admitting: Internal Medicine

## 2016-08-14 ENCOUNTER — Encounter: Payer: Self-pay | Admitting: Internal Medicine

## 2016-09-09 ENCOUNTER — Ambulatory Visit (INDEPENDENT_AMBULATORY_CARE_PROVIDER_SITE_OTHER): Payer: BLUE CROSS/BLUE SHIELD | Admitting: Internal Medicine

## 2016-09-09 VITALS — BP 112/74 | HR 64 | Temp 98.3°F | Resp 16 | Wt 190.0 lb

## 2016-09-09 DIAGNOSIS — I10 Essential (primary) hypertension: Secondary | ICD-10-CM | POA: Diagnosis not present

## 2016-09-09 DIAGNOSIS — E119 Type 2 diabetes mellitus without complications: Secondary | ICD-10-CM

## 2016-09-09 DIAGNOSIS — H9191 Unspecified hearing loss, right ear: Secondary | ICD-10-CM | POA: Diagnosis not present

## 2016-09-09 DIAGNOSIS — H6691 Otitis media, unspecified, right ear: Secondary | ICD-10-CM | POA: Diagnosis not present

## 2016-09-09 DIAGNOSIS — R42 Dizziness and giddiness: Secondary | ICD-10-CM | POA: Diagnosis not present

## 2016-09-09 MED ORDER — AMOXICILLIN 500 MG PO CAPS: ORAL_CAPSULE | ORAL | 0 refills | Status: DC

## 2016-09-09 MED ORDER — MECLIZINE HCL 12.5 MG PO TABS: 12.5000 mg | ORAL_TABLET | Freq: Three times a day (TID) | ORAL | 1 refills | Status: DC | PRN

## 2016-09-09 NOTE — Assessment & Plan Note (Signed)
stable overall by history and exam, recent data reviewed with pt, and pt to continue medical treatment as before,  to f/u any worsening symptoms or concerns / Lab Results  Component Value Date   HGBA1C 6.8 (H) 06/04/2016

## 2016-09-09 NOTE — Progress Notes (Signed)
Subjective:    Patient ID: Jacob [name], male    DOB: 1965-11-19, 51 y.o.   MRN: [id number]  HPI   Here with 2-3 days acute onset fever, right ear pain, pressure, headache, general weakness and malaise, with mild ST and cough and intermittent vertigo, but pt denies chest pain, wheezing, increased sob or doe, orthopnea, PND, increased LE swelling, palpitations, or syncope.  Has worsening right hearing loss in the past wk - ? Wax again. Pt denies new neurological symptoms such as new headache, or facial or extremity weakness or numbness   Pt denies polydipsia, polyuria Past Medical History:  Diagnosis Date  . Allergy   . Anxiety   . Cholesteatoma of right ear   . 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 Enlarged prostate   . GERD (gastroesophageal reflux disease)   . Hernia of abdominal wall   . Hyperlipidemia 10/18/2014  . Hypertension    Past Surgical History:  Procedure Laterality Date  . CHOLECYSTECTOMY    . [place]  . INNER EAR SURGERY     right  . LUMBAR DISC SURGERY    . tubes in bil ears      reports that he has never smoked. He has never used smokeless tobacco. He reports that he drinks about 3.0 oz of alcohol per week . He reports that he does not use drugs. family history includes COPD in his mother; Diabetes in his maternal grandmother and paternal grandmother; Heart disease in his father; Hypertension in his mother; Lung cancer in his maternal uncle. No Known Allergies Current Outpatient Prescriptions on File Prior to Visit  Medication Sig Dispense Refill  . alfuzosin (UROXATRAL) 10 MG 24 hr tablet Take 1 tablet (10 mg total) by mouth daily with breakfast. 90 tablet 3  . ALPRAZolam (XANAX) 0.5 MG tablet Take 1-2 tablets (0.5-1 mg total) by mouth at bedtime as needed. 60 tablet 5  . aspirin EC 81 MG tablet Take 1 tablet (81 mg total) by mouth daily. 90 tablet 11  . cetirizine (ZYRTEC) 10 MG tablet Take 1 tablet (10 mg total) by mouth daily. 30  tablet 11  . Diclofenac Sodium 2 % SOLN Apply 1 pump twice daily. 112 g 3  . metFORMIN (GLUCOPHAGE-XR) 500 MG 24 hr tablet Take 3 tablets (1,500 mg total) by mouth daily with breakfast. 270 tablet 3  . Multiple Vitamin (MULTIVITAMIN WITH MINERALS) TABS tablet Take 1 tablet by mouth daily.    . Omega-3 Fatty Acids (FISH OIL PO) Take 1 capsule by mouth daily.    . ramipril (ALTACE) 10 MG capsule Take 1 capsule (10 mg total) by mouth 2 (two) times daily. 180 capsule 3  . sildenafil (REVATIO) 20 MG tablet Take 3-5 tabs by mouth as needed 60 tablet 2   No current facility-administered medications on file prior to visit.    Review of Systems  Constitutional: Negative for other unusual diaphoresis or sweats HENT: Negative for ear discharge or swelling Eyes: Negative for other worsening visual disturbances Respiratory: Negative for stridor or other swelling  Gastrointestinal: Negative for worsening distension or other blood Genitourinary: Negative for retention or other urinary change Musculoskeletal: Negative for other MSK pain or swelling Skin: Negative for color change or other new lesions Neurological: Negative for worsening tremors and other numbness  Psychiatric/Behavioral: Negative for worsening agitation or other fatigue All other system neg per pt    Objective:   Physical Exam BP 112/74   Pulse  64   Temp 98.3 F (36.8 C) (Oral)   Resp 16   Wt 190 lb (86.2 kg)   SpO2 98%   BMI 25.77 kg/m  VS noted, mild ill Constitutional: Pt appears in NAD HENT: Head: NCAT.  Right Ear: External ear normal. Right ext canal cleared of wax impaction, hearing improved, right TM with severe erythema, slight bulging Left Ear: External ear normal.  Eyes: . Pupils are equal, round, and reactive to light. Conjunctivae and EOM are normal Nose: without d/c or deformity Neck: Neck supple. Gross normal ROM Cardiovascular: Normal rate and regular rhythm.   Pulmonary/Chest: Effort normal and breath  sounds without rales or wheezing.  Neurological: Pt is alert. At baseline orientation, motor grossly intact Skin: Skin is warm. No rashes, other new lesions, no LE edema Psychiatric: Pt behavior is normal without agitation  No other exam findings    Assessment & Plan:

## 2016-09-09 NOTE — Assessment & Plan Note (Signed)
stable overall by history and exam, recent data reviewed with pt, and pt to continue medical treatment as before,  to f/u any worsening symptoms or concerns BP Readings from Last 3 Encounters:  09/09/16 112/74  05/29/16 (!) 142/86  04/21/16 124/74

## 2016-09-09 NOTE — Assessment & Plan Note (Signed)
Mild, for meclizine prn,  to f/u any worsening symptoms or concerns 

## 2016-09-09 NOTE — Assessment & Plan Note (Signed)
Improved with irrigation,  to f/u any worsening symptoms or concerns  

## 2016-09-09 NOTE — Assessment & Plan Note (Signed)
Mild to mod, for antibx course,  to f/u any worsening symptoms or concerns 

## 2016-09-09 NOTE — Patient Instructions (Signed)
Your right ear was irrigated of wax today  Please take all new medication as prescribed - the antibiotic, and meclizine for further dizziness if needed  You are given the [employer] note for work  Please continue all other medications as before, and refills have been done if requested.  Please have the pharmacy call with any other refills you may need.  Please keep your appointments with your specialists as you may have planned

## 2016-11-18 ENCOUNTER — Telehealth: Payer: Self-pay | Admitting: Internal Medicine

## 2016-11-18 MED ORDER — TADALAFIL 20 MG PO TABS: 20.0000 mg | ORAL_TABLET | Freq: Every day | ORAL | 11 refills | Status: DC | PRN

## 2016-11-18 NOTE — Telephone Encounter (Signed)
Dr. Jenny Reichmann I do not see this med listed it pt's chart. I only see Revatio. Please advise.

## 2016-11-18 NOTE — Telephone Encounter (Signed)
tadalafil (CIALIS) 20 MG tablet   Patient has a new insurance and they will cover this medication. He is requesting a refill on this. He states he can only have 10 at a time. Please advise.

## 2016-11-18 NOTE — Telephone Encounter (Signed)
[place] for change revatio to cialis  - done erx

## 2016-11-19 ENCOUNTER — Encounter: Payer: Self-pay | Admitting: Internal Medicine

## 2016-11-19 ENCOUNTER — Ambulatory Visit (INDEPENDENT_AMBULATORY_CARE_PROVIDER_SITE_OTHER): Payer: 59 | Admitting: Internal Medicine

## 2016-11-19 VITALS — BP 126/78 | HR 71 | Temp 98.3°F | Ht 72.0 in | Wt 189.0 lb

## 2016-11-19 DIAGNOSIS — E785 Hyperlipidemia, unspecified: Secondary | ICD-10-CM | POA: Diagnosis not present

## 2016-11-19 DIAGNOSIS — Z23 Encounter for immunization: Secondary | ICD-10-CM

## 2016-11-19 DIAGNOSIS — Z0001 Encounter for general adult medical examination with abnormal findings: Secondary | ICD-10-CM | POA: Diagnosis not present

## 2016-11-19 DIAGNOSIS — E119 Type 2 diabetes mellitus without complications: Secondary | ICD-10-CM

## 2016-11-19 DIAGNOSIS — I10 Essential (primary) hypertension: Secondary | ICD-10-CM | POA: Diagnosis not present

## 2016-11-19 DIAGNOSIS — R1013 Epigastric pain: Secondary | ICD-10-CM | POA: Diagnosis not present

## 2016-11-19 MED ORDER — PANTOPRAZOLE SODIUM 40 MG PO TBEC: 40.0000 mg | DELAYED_RELEASE_TABLET | Freq: Every day | ORAL | 3 refills | Status: DC

## 2016-11-19 MED ORDER — DICLOFENAC SODIUM 2 % TD SOLN: TRANSDERMAL | 3 refills | Status: DC

## 2016-11-19 NOTE — Patient Instructions (Signed)
Please take all new medication as prescribed - the protonix  Please continue all other medications as before, and refills have been done if requested.  Please have the pharmacy call with any other refills you may need.  Please continue your efforts at being more active, low cholesterol diet, and weight control.  You are otherwise up to date with prevention measures today.  Please keep your appointments with your specialists as you may have planned  Please go to the LAB in the Basement (turn left off the elevator) for the tests to be done tomorrow  You will be contacted by phone if any changes need to be made immediately.  Otherwise, you will receive a letter about your results with an explanation, but please check with MyChart first.  Please remember to sign up for MyChart if you have not done so, as this will be important to you in the future with finding out test results, communicating by private email, and scheduling acute appointments online when needed.  Please return in 6 months, or sooner if needed, with Lab testing done 3-5 days before

## 2016-11-19 NOTE — Progress Notes (Signed)
Subjective:    Patient ID: Jacob [name], male    DOB: 29-Aug-1965, 51 y.o.   MRN: [id number]  HPI  Here for wellness and f/u;  Overall doing ok;  Pt denies Chest pain, worsening SOB, DOE, wheezing, orthopnea, PND, worsening LE edema, palpitations, dizziness or syncope.  Pt denies neurological change such as new headache, facial or extremity weakness.  Pt denies polydipsia, polyuria, or low sugar symptoms. Pt states overall good compliance with treatment and medications, good tolerability, and has been trying to follow appropriate diet.  Pt denies worsening depressive symptoms, suicidal ideation or panic. No fever, night sweats, wt loss, loss of appetite, or other constitutional symptoms.  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  Denies urinary symptoms such as dysuria, frequency, urgency, flank pain, hematuria or n/v, fever, chills. Recalls flomax caused retrograde ejaculation, but having only once nightly nocturia and cannt really tell the difference in flow.  Also taking 2 metformin instead of 3 for now since has back to gym, down 4 lbs, concerned about possible low sugars.     Wt Readings from Last 3 Encounters:  11/19/16 189 lb (85.7 kg)  09/09/16 190 lb (86.2 kg)  05/29/16 193 lb (87.5 kg)  He is also concerned that a brother younger than him with hx of smoking,recent MI and PVD, may need amputation  Pt continues to have recurring LBP, right shoulder pain and bilat knee pain, but no bowel or bladder change, fever, wt loss,  worsening LE pain/numbness/weakness, gait change or falls.  Also with several weeks onset mild to mod kind of nausea/discomfort to the upper mid abdomen, but denies worsening reflux, other abd pain,  dysphagia, n/v, bowel change or blood. Past Medical History:  Diagnosis Date  . Allergy   . Anxiety   . Cholesteatoma of right ear   . 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 Enlarged prostate   . GERD (gastroesophageal reflux disease)   . Hernia of abdominal wall   . Hyperlipidemia 10/18/2014  . Hypertension    Past Surgical History:  Procedure Laterality Date  . CHOLECYSTECTOMY    . [place]  . INNER EAR SURGERY     right  . LUMBAR DISC SURGERY    . tubes in bil ears      reports that he has never smoked. He has never used smokeless tobacco. He reports that he drinks about 3.0 oz of alcohol per week . He reports that he does not use drugs. family history includes COPD in his mother; Diabetes in his maternal grandmother and paternal grandmother; Heart disease in his father; Hypertension in his mother; Lung cancer in his maternal uncle. No Known Allergies Current Outpatient Prescriptions on File Prior to Visit  Medication Sig Dispense Refill  . ALPRAZolam (XANAX) 0.5 MG tablet Take 1-2 tablets (0.5-1 mg total) by mouth at bedtime as needed. 60 tablet 5  . aspirin EC 81 MG tablet Take 1 tablet (81 mg total) by mouth daily. 90 tablet 11  . metFORMIN (GLUCOPHAGE-XR) 500 MG 24 hr tablet Take 3 tablets (1,500 mg total) by mouth daily with breakfast. 270 tablet 3  . Multiple Vitamin (MULTIVITAMIN WITH MINERALS) TABS tablet Take 1 tablet  by mouth daily.    . Omega-3 Fatty Acids (FISH OIL PO) Take 1 capsule by mouth daily.    . ramipril (ALTACE) 10 MG capsule Take 1 capsule (10 mg total) by mouth 2 (two) times daily. 180 capsule 3  . tadalafil (CIALIS) 20 MG tablet Take 1 tablet (20 mg total) by mouth daily as needed for erectile dysfunction. 10 tablet 11  . cetirizine (ZYRTEC) 10 MG tablet Take 1 tablet (10 mg total) by mouth daily. (Patient not taking: Reported on 11/19/2016) 30 tablet 11  . meclizine (ANTIVERT) 12.5 MG tablet Take 1 tablet (12.5 mg total) by mouth  3 (three) times daily as needed for dizziness. (Patient not taking: Reported on 11/19/2016) 30 tablet 1   No current facility-administered medications on file prior to visit.    Review of Systems Constitutional: Negative for other unusual diaphoresis, sweats, appetite or weight changes HENT: Negative for other worsening hearing loss, ear pain, facial swelling, mouth sores or neck stiffness.   Eyes: Negative for other worsening pain, redness or other visual disturbance.  Respiratory: Negative for other stridor or swelling Cardiovascular: Negative for other palpitations or other chest pain  Gastrointestinal: Negative for worsening diarrhea or loose stools, blood in stool, distention or other pain Genitourinary: Negative for hematuria, flank pain or other change in urine volume.  Musculoskeletal: Negative for myalgias or other joint swelling.  Skin: Negative for other color change, or other wound or worsening drainage.  Neurological: Negative for other syncope or numbness. Hematological: Negative for other adenopathy or swelling Psychiatric/Behavioral: Negative for hallucinations, other worsening agitation, SI, self-injury, or new decreased concentration  All other system neg per pt     Objective:   Physical Exam BP 126/78   Pulse 71   Temp 98.3 F (36.8 C) (Oral)   Ht 6' (1.829 m)   Wt 189 lb (85.7 kg)   SpO2 98%   BMI 25.63 kg/m  VS noted,  Constitutional: Pt is oriented to person, place, and time. Appears well-developed and well-nourished, in no significant distress and comfortable Head: Normocephalic and atraumatic  Eyes: Conjunctivae and EOM are normal. Pupils are equal, round, and reactive to light Right Ear: External ear normal without discharge Left Ear: External ear normal without discharge Nose: Nose without discharge or deformity Mouth/Throat: Oropharynx is without other ulcerations and moist  Neck: Normal range of motion. Neck supple. No JVD present. No tracheal  deviation present or significant neck LA or mass Cardiovascular: Normal rate, regular rhythm, normal heart sounds and intact distal pulses.   Pulmonary/Chest: WOB normal and breath sounds without rales or wheezing  Abdominal: Soft. Bowel sounds are normal. No HSM, mild tender epigastrium without guarding or rebound  Musculoskeletal: Normal range of motion. Exhibits no edema Lymphadenopathy: Has no other cervical adenopathy.  Neurological: Pt is alert and oriented to person, place, and time. Pt has normal reflexes. No cranial nerve deficit. Motor grossly intact, Gait intact Skin: Skin is warm and dry. No rash noted or new ulcerations Psychiatric:  Has normal mood and affect. Behavior is normal without agitation No other exam findings Lab Results  Component Value Date   WBC 4.7 06/04/2016   HGB 15.9 06/04/2016   HCT 46.5 06/04/2016   PLT 232.0 06/04/2016   GLUCOSE 174 (H) 06/04/2016   CHOL 190 06/04/2016   TRIG 136.0 06/04/2016   HDL 41.20 06/04/2016   LDLDIRECT 144.0 10/18/2014   LDLCALC 122 (H) 06/04/2016   ALT 27 06/04/2016   AST 21 06/04/2016   NA  140 06/04/2016   K 4.7 06/04/2016   CL 105 06/04/2016   CREATININE 1.21 06/04/2016   BUN 14 06/04/2016   CO2 28 06/04/2016   TSH 1.72 06/04/2016   PSA 0.93 06/04/2016   INR 1.0 01/18/2007   HGBA1C 6.8 (H) 06/04/2016   MICROALBUR 1.2 06/04/2016      Assessment & Plan:

## 2016-11-19 NOTE — Telephone Encounter (Signed)
Called pt no answer LMOM rx sent to [facility].../lmb

## 2016-11-22 NOTE — Assessment & Plan Note (Signed)
Goal LDL < 70,  Lab Results  Component Value Date   LDLCALC 122 (H) 06/04/2016  for f/u labs, consider statin but he is not accepting of crestor for now

## 2016-11-22 NOTE — Assessment & Plan Note (Signed)

## 2016-11-22 NOTE — Assessment & Plan Note (Signed)
Mild to mod, for PPI, consider GI if not improved  In addition to the time spent performing CPE, I spent an additional 15 minutes face to face,in which greater than 50% of this time was spent in counseling and coordination of care for patient's illness as documented, in d/w pt regarding the differential dx, tx, further evaluation and other management of chronic pain, DM, HTN, and dyspepsia

## 2016-11-22 NOTE — Assessment & Plan Note (Signed)
stable overall by history and exam, recent data reviewed with pt, and pt to continue medical treatment as before,  to f/u any worsening symptoms or concerns BP Readings from Last 3 Encounters:  11/19/16 126/78  09/09/16 112/74  05/29/16 (!) 142/86

## 2016-11-22 NOTE — Assessment & Plan Note (Signed)
Stable on lower metformin dosing, and adequate A1c, cont to follow diet, cont meds, for f/u lab next visit

## 2016-11-23 ENCOUNTER — Other Ambulatory Visit (INDEPENDENT_AMBULATORY_CARE_PROVIDER_SITE_OTHER): Payer: 59

## 2016-11-23 DIAGNOSIS — Z0001 Encounter for general adult medical examination with abnormal findings: Secondary | ICD-10-CM

## 2016-11-23 DIAGNOSIS — E119 Type 2 diabetes mellitus without complications: Secondary | ICD-10-CM

## 2016-11-23 LAB — URINALYSIS, ROUTINE W REFLEX MICROSCOPIC
BILIRUBIN URINE: NEGATIVE
HGB URINE DIPSTICK: NEGATIVE
Ketones, ur: NEGATIVE
LEUKOCYTES UA: NEGATIVE
NITRITE: NEGATIVE
RBC / HPF: NONE SEEN (ref 0–?)
SPECIFIC GRAVITY, URINE: 1.025 (ref 1.000–1.030)
URINE GLUCOSE: NEGATIVE
Urobilinogen, UA: 0.2 (ref 0.0–1.0)
WBC, UA: NONE SEEN (ref 0–?)
pH: 6 (ref 5.0–8.0)

## 2016-11-23 LAB — CBC WITH DIFFERENTIAL/PLATELET
BASOS ABS: 0 10*3/uL (ref 0.0–0.1)
Basophils Relative: 0.4 % (ref 0.0–3.0)
EOS ABS: 0.1 10*3/uL (ref 0.0–0.7)
Eosinophils Relative: 2.3 % (ref 0.0–5.0)
HCT: 45 % (ref 39.0–52.0)
HEMOGLOBIN: 15.5 g/dL (ref 13.0–17.0)
Lymphocytes Relative: 36.2 % (ref 12.0–46.0)
Lymphs Abs: 1.6 10*3/uL (ref 0.7–4.0)
MCHC: 34.3 g/dL (ref 30.0–36.0)
MCV: 86.8 fl (ref 78.0–100.0)
MONO ABS: 0.4 10*3/uL (ref 0.1–1.0)
Monocytes Relative: 8.9 % (ref 3.0–12.0)
Neutro Abs: 2.4 10*3/uL (ref 1.4–7.7)
Neutrophils Relative %: 52.2 % (ref 43.0–77.0)
Platelets: 219 10*3/uL (ref 150.0–400.0)
RBC: 5.18 Mil/uL (ref 4.22–5.81)
RDW: 13.4 % (ref 11.5–15.5)
WBC: 4.5 10*3/uL (ref 4.0–10.5)

## 2016-11-23 LAB — LIPID PANEL
CHOLESTEROL: 194 mg/dL (ref 0–200)
HDL: 39.1 mg/dL (ref >39.00)
NonHDL: 154.56
TRIGLYCERIDES: 262 mg/dL — AB (ref 0.0–149.0)
Total CHOL/HDL Ratio: 5
VLDL: 52.4 mg/dL — AB (ref 0.0–40.0)

## 2016-11-23 LAB — MICROALBUMIN / CREATININE URINE RATIO
Creatinine,U: 244.4 mg/dL
Microalb Creat Ratio: 0.6 mg/g (ref 0.0–30.0)
Microalb, Ur: 1.4 mg/dL (ref 0.0–1.9)

## 2016-11-23 LAB — PSA: PSA: 1.16 ng/mL (ref 0.10–4.00)

## 2016-11-23 LAB — BASIC METABOLIC PANEL
BUN: 15 mg/dL (ref 6–23)
CALCIUM: 9.8 mg/dL (ref 8.4–10.5)
CO2: 27 mEq/L (ref 19–32)
CREATININE: 1.26 mg/dL (ref 0.40–1.50)
Chloride: 104 mEq/L (ref 96–112)
GFR: 64.47 mL/min (ref >60.00)
GLUCOSE: 140 mg/dL — AB (ref 70–99)
Potassium: 4.3 mEq/L (ref 3.5–5.1)
SODIUM: 137 meq/L (ref 135–145)

## 2016-11-23 LAB — HEPATIC FUNCTION PANEL
ALBUMIN: 4.5 g/dL (ref 3.5–5.2)
ALT: 42 U/L (ref 0–53)
AST: 28 U/L (ref 0–37)
Alkaline Phosphatase: 70 U/L (ref 39–117)
Bilirubin, Direct: 0.2 mg/dL (ref 0.0–0.3)
TOTAL PROTEIN: 7.4 g/dL (ref 6.0–8.3)
Total Bilirubin: 0.6 mg/dL (ref 0.2–1.2)

## 2016-11-23 LAB — HEMOGLOBIN A1C: HEMOGLOBIN A1C: 7.3 % — AB (ref 4.6–6.5)

## 2016-11-23 LAB — TSH: TSH: 2.6 u[IU]/mL (ref 0.35–4.50)

## 2016-11-23 LAB — LDL CHOLESTEROL, DIRECT: Direct LDL: 102 mg/dL

## 2016-11-24 ENCOUNTER — Telehealth: Payer: Self-pay | Admitting: Internal Medicine

## 2016-11-24 NOTE — Telephone Encounter (Signed)
Key: [id number] Submitted 11/24/16

## 2016-11-24 NOTE — Telephone Encounter (Signed)
Would like to follow up on request for PA for diclofenac sodium 2%

## 2016-11-25 MED ORDER — DICLOFENAC SODIUM 1 % TD GEL: 4.0000 g | Freq: Four times a day (QID) | TRANSDERMAL | 3 refills | Status: DC | PRN

## 2016-11-25 NOTE — Telephone Encounter (Signed)
Notified pharmacy.  Please send a different medication in for patient.

## 2016-11-25 NOTE — Telephone Encounter (Signed)
Per [employer], PA can not be performed because the requested mediation is a plan exclusion for this member, so there is no coverage criteria to review and apply. Can contact member services listed on the back of their ID card.   Determination letter sent to scan.

## 2016-11-25 NOTE — Addendum Note (Signed)
Addended by: Biagio Borg on: 11/25/2016 12:47 PM   Modules accepted: Orders

## 2016-11-25 NOTE — Telephone Encounter (Signed)
Ok for volt gel prn - done erx

## 2017-01-07 ENCOUNTER — Other Ambulatory Visit: Payer: Self-pay | Admitting: Internal Medicine

## 2017-01-07 NOTE — Telephone Encounter (Signed)
Done hardcopy to [place]  

## 2017-01-08 NOTE — Telephone Encounter (Signed)
Faxed

## 2017-03-01 ENCOUNTER — Encounter: Payer: Self-pay | Admitting: Physician Assistant

## 2017-03-10 ENCOUNTER — Ambulatory Visit: Payer: Self-pay | Admitting: Physician Assistant

## 2017-03-26 ENCOUNTER — Ambulatory Visit: Payer: Self-pay | Admitting: Gastroenterology

## 2017-04-09 ENCOUNTER — Encounter: Payer: Self-pay | Admitting: Gastroenterology

## 2017-04-09 ENCOUNTER — Ambulatory Visit: Payer: 59 | Admitting: Gastroenterology

## 2017-04-09 VITALS — BP 122/70 | HR 95 | Ht 72.0 in | Wt 196.8 lb

## 2017-04-09 DIAGNOSIS — K219 Gastro-esophageal reflux disease without esophagitis: Secondary | ICD-10-CM

## 2017-04-09 DIAGNOSIS — R1310 Dysphagia, unspecified: Secondary | ICD-10-CM | POA: Diagnosis not present

## 2017-04-09 DIAGNOSIS — Z1211 Encounter for screening for malignant neoplasm of colon: Secondary | ICD-10-CM

## 2017-04-09 DIAGNOSIS — R110 Nausea: Secondary | ICD-10-CM | POA: Insufficient documentation

## 2017-04-09 MED ORDER — NA SULFATE-K SULFATE-MG SULF 17.5-3.13-1.6 GM/177ML PO SOLN: 1.0000 | Freq: Once | ORAL | 0 refills | Status: AC

## 2017-04-09 NOTE — Progress Notes (Addendum)
04/09/2017 Jacob [name] [id number] Jul 07, 1965   HISTORY OF PRESENT ILLNESS: This is a pleasant 52 year old male who is previously known to Dr. Hilarie Fredrickson for an EGD 2013.  The patient saw Dr. Hilarie Fredrickson at that time for complaints of dysphasia.  He underwent EGD and was found to have only some gastritis.  Biopsies showed normal benign gastric mucosa, negative for H. pylori.  He empirically dilated his esophagus due to complaints of dysphagia.  The patient tells me that he did not notice really any improvement with his dysphagia after that.  Anyway, he is here today again with complaints of dysphasia.  A couple times he makes comments about the dysphagia possibly being in his head and the fact that he is a hypochondriac.  He says that he really just tries to avoid any foods that he has had issues with in the past, including shredded chicken, apples with peel, etc.  He says that he had an incident when he was a child where he choked on some food and had to have the Heimlich maneuver so has been very worried about choking since that time.  He says that he can eat a hamburger and regular pieces of chicken without any problem.  He had been experiencing a lot of acid reflux.  Was only on omeprazole 20 mg OTC daily.  He has been on pantoprazole 40 mg daily for the last several months and his reflux has been under much better control.  He also had a period where he was having a lot of nausea and strong sensation to vomit, although he never did vomit.  That has about resolved as well.  He denies NSAID use.  He has never had colonoscopy in the past.  He denies any issues with moving his bowels.  No dark or bloody stools.  No weight loss.  Past Medical History:  Diagnosis Date  . Allergy   . Anxiety   . Cholesteatoma of right ear   . 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 Enlarged prostate   . GERD (gastroesophageal reflux disease)   . Hernia of abdominal wall   . Hyperlipidemia 10/18/2014  . Hypertension     Past Surgical History:  Procedure Laterality Date  . CHOLECYSTECTOMY    . [place]  . INNER EAR SURGERY     right  . LUMBAR DISC SURGERY    . tubes in bil ears      reports that  has never smoked. he has never used smokeless tobacco. He reports that he drinks about 3.0 oz of alcohol per week. He reports that he does not use drugs. family history includes COPD in his mother; Diabetes in his maternal grandmother and paternal grandmother; Heart disease in his father; Hypertension in his mother; Lung cancer in his maternal uncle. No Known Allergies    Outpatient Encounter Medications as of 04/09/2017  Medication Sig  . ALPRAZolam (XANAX) 0.5 MG tablet TAKE 1-2 TABLET BY MOUTH AT BEDTIME AS NEEDED (Patient taking differently: TAKE 1-2 TABLET BY MOUTH AT BEDTIME)  . aspirin EC 81 MG tablet Take 1 tablet (81 mg total) by mouth daily.  . metFORMIN (GLUCOPHAGE-XR) 500 MG 24 hr tablet Take 3 tablets (1,500 mg total) by mouth daily with breakfast.  . Multiple Vitamin (MULTIVITAMIN WITH MINERALS) TABS tablet Take 1 tablet by mouth daily.  . Omega-3 Fatty Acids (FISH OIL PO) Take 1 capsule by mouth daily.  . pantoprazole (PROTONIX) 40 MG  tablet Take 1 tablet (40 mg total) by mouth daily.  . ramipril (ALTACE) 10 MG capsule Take 1 capsule (10 mg total) by mouth 2 (two) times daily.  . tadalafil (CIALIS) 20 MG tablet Take 1 tablet (20 mg total) by mouth daily as needed for erectile dysfunction.  . [DISCONTINUED] cetirizine (ZYRTEC) 10 MG tablet Take 1 tablet (10 mg total) by mouth daily. (Patient not taking: Reported on 11/19/2016)  . [DISCONTINUED] diclofenac sodium (VOLTAREN) 1 % GEL Apply 4 g topically 4 (four) times daily as needed.  . [DISCONTINUED] meclizine (ANTIVERT) 12.5 MG tablet Take 1 tablet (12.5 mg total) by mouth 3 (three) times daily as needed for dizziness. (Patient not taking: Reported on 11/19/2016)   No facility-administered encounter medications on file as of  04/09/2017.      REVIEW OF SYSTEMS  : All other systems reviewed and negative except where noted in the History of Present Illness.   PHYSICAL EXAM: BP 122/70   Pulse 95   Ht 6' (1.829 m)   Wt 196 lb 12.8 oz (89.3 kg)   BMI 26.69 kg/m  General: Well developed white male in no acute distress Head: Normocephalic and atraumatic Eyes:  Sclerae anicteric, conjunctiva pink. Ears: Normal auditory acuity Lungs: Clear throughout to auscultation; no increased WOB. Heart: Regular rate and rhythm; no M/R/G. Abdomen: Soft, non-distended.  BS present.  Non-tender. Rectal:  Will be done at the time of colonoscopy. Musculoskeletal: Symmetrical with no gross deformities  Skin: No lesions on visible extremities Extremities: No edema  Neurological: Alert oriented x 4, grossly non-focal Psychological:  Alert and cooperative. Normal mood and affect  ASSESSMENT AND PLAN: *Nausea, GERD, dysphagia:  GERD seems well controlled on pantoprazole 40 mg daily and the nausea has about resolved.  Still has dysphagia, but that has been present for years.  No improvement previously with empiric dilation.  Will check barium esophagram/UGI--look for esophageal dysmotility, etc.  ? If he will need a manometry study.  I really think that some of this is more of a fear and psychologic. *Screening colonoscopy:  Will schedule with Dr. Hilarie Fredrickson.  **The risks, benefits, and alternatives to colonoscopy were discussed with the patient and he consents to proceed.   CC:  Biagio Borg, MD  Addendum: Reviewed and agree with initial management. Pyrtle, Lajuan Lines, MD

## 2017-04-09 NOTE — Patient Instructions (Signed)
If you are age 51 or older, your body mass index should be between 23-30. Your Body mass index is 26.69 kg/m. If this is out of the aforementioned range listed, please consider follow up with your Primary Care Provider.  If you are age 23 or younger, your body mass index should be between 19-25. Your Body mass index is 26.69 kg/m. If this is out of the aformentioned range listed, please consider follow up with your Primary Care Provider.   We have sent the following medications to your pharmacy for you to pick up at your convenience:  [place] have been scheduled for an Upper GI Series and Small Bowel Follow Thru at [facility] will need to enter through the first floor Radiology Department. Your appointment is on Thursday, January 17th at 8:30am. Please arrive 15 minutes prior to your test for registration. Make certain not to have anything to eat or drink after midnight on the night before your test. If you need to reschedule, please contact radiology at [phone]. --------------------------------------------------------------------------------------------------------------- An upper GI series uses x rays to help diagnose problems of the upper GI tract, which includes the esophagus, stomach, and duodenum. The duodenum is the first part of the small intestine. An upper GI series is conducted by a radiology technologist or a radiologist-a doctor who specializes in x-ray imaging-at a hospital or outpatient center. While sitting or standing in front of an x-ray machine, the patient drinks barium liquid, which is often white and has a chalky consistency and taste. The barium liquid coats the lining of the upper GI tract and makes signs of disease show up more clearly on x rays. X-ray video, called fluoroscopy, is used to view the barium liquid moving through the esophagus, stomach, and duodenum. Additional x rays and fluoroscopy are performed while the patient lies on an x-ray table.  To fully coat the upper GI tract with barium liquid, the technologist or radiologist may press on the abdomen or ask the patient to change position. Patients hold still in various positions, allowing the technologist or radiologist to take x rays of the upper GI tract at different angles. If a technologist conducts the upper GI series, a radiologist will later examine the images to look for problems.  This test typically takes about 1 hour to complete --------------------------------------------------------------------------------------------------------------------------------------------- The Small Bowel Follow Thru examination is used to visualize the entire small bowel (intestines); specifically the connection between the small and large intestine. You will be positioned on a flat x-ray table and an image of your abdomen taken. Then the technologist will show the x-ray to the radiologist. The radiologist will instruct your technologist how much (1-2 cups) barium sulfate you will drink and when to begin taking the timed x-rays, usually 15-30 minutes after you begin drinking. Barium is a harmless substance that will highlight your small intestine by absorbing x-ray. The taste is chalky and it feels very heavy both in the cup and in your stomach.  After the first x-ray is taken and shown to the radiologist, he/she will determine when the next image is to be taken. This is repeated until the barium has reached the end of the small intestine and enters the beginning of the colon (cecum). At such time when the barium spills into the colon, you will be positioned on the x-ray table once again. The radiologist will use a fluoroscopic camera to take some detailed pictures of the connection between your small intestine and colon. The fluoroscope is an  x-ray unit that works with a television/computer screen. The radiologist will apply pressure to your abdomen with his/her hand and a lead glove, a plastic paddle, or a  paddle with an inflated rubber balloon on the end. This is to spread apart your loops of intestine so he/she can see all areas.   This test typically takes around 1 hour to complete.  Important. Drink plenty of water (8-10 cups/day) for a few days following the procedure to avoid constipation and blockage. The barium will make your stools white for a few days. -------------------------------------------------------------------------------------------------------------------------------------------- You have been scheduled for a colonoscopy. Please follow written instructions given to you at your visit today.  Please pick up your prep supplies at the pharmacy within the next 1-3 days. If you use inhalers (even only as needed), please bring them with you on the day of your procedure. Your physician has requested that you go to www.startemmi.com and enter the access code given to you at your visit today. This web site gives a general overview about your procedure. However, you should still follow specific instructions given to you by our office regarding your preparation for the procedure.

## 2017-04-15 ENCOUNTER — Ambulatory Visit (HOSPITAL_COMMUNITY)
Admission: RE | Admit: 2017-04-15 | Discharge: 2017-04-15 | Disposition: A | Discharge status: Home / Self Care | Payer: 59 | Source: Ambulatory Visit | Attending: Gastroenterology | Admitting: Gastroenterology

## 2017-04-15 DIAGNOSIS — R110 Nausea: Secondary | ICD-10-CM | POA: Insufficient documentation

## 2017-04-15 DIAGNOSIS — R1310 Dysphagia, unspecified: Secondary | ICD-10-CM | POA: Diagnosis not present

## 2017-04-15 DIAGNOSIS — K219 Gastro-esophageal reflux disease without esophagitis: Secondary | ICD-10-CM

## 2017-04-30 ENCOUNTER — Encounter: Payer: Self-pay | Admitting: Internal Medicine

## 2017-05-13 ENCOUNTER — Other Ambulatory Visit: Payer: Self-pay | Admitting: Internal Medicine

## 2017-05-14 ENCOUNTER — Encounter: Payer: Self-pay | Admitting: Internal Medicine

## 2017-06-03 ENCOUNTER — Emergency Department (HOSPITAL_COMMUNITY)
Admission: EM | Admit: 2017-06-03 | Discharge: 2017-06-03 | Disposition: A | Discharge status: Home / Self Care | Payer: 59 | Attending: Emergency Medicine | Admitting: Emergency Medicine

## 2017-06-03 ENCOUNTER — Encounter (HOSPITAL_COMMUNITY): Payer: Self-pay | Admitting: Emergency Medicine

## 2017-06-03 ENCOUNTER — Emergency Department (HOSPITAL_COMMUNITY): Payer: 59

## 2017-06-03 DIAGNOSIS — Z7982 Long term (current) use of aspirin: Secondary | ICD-10-CM | POA: Diagnosis not present

## 2017-06-03 DIAGNOSIS — R002 Palpitations: Secondary | ICD-10-CM

## 2017-06-03 DIAGNOSIS — I10 Essential (primary) hypertension: Secondary | ICD-10-CM | POA: Diagnosis not present

## 2017-06-03 LAB — BASIC METABOLIC PANEL
Anion gap: 10 (ref 5–15)
BUN: 16 mg/dL (ref 6–20)
CHLORIDE: 107 mmol/L (ref 101–111)
CO2: 25 mmol/L (ref 22–32)
CREATININE: 1.26 mg/dL — AB (ref 0.61–1.24)
Calcium: 9.8 mg/dL (ref 8.9–10.3)
GFR calc Af Amer: >60 mL/min (ref >60)
GFR calc non Af Amer: >60 mL/min (ref >60)
GLUCOSE: 124 mg/dL — AB (ref 65–99)
POTASSIUM: 4.4 mmol/L (ref 3.5–5.1)
Sodium: 142 mmol/L (ref 135–145)

## 2017-06-03 LAB — CBC
HEMATOCRIT: 47 % (ref 39.0–52.0)
Hemoglobin: 16.4 g/dL (ref 13.0–17.0)
MCH: 30.2 pg (ref 26.0–34.0)
MCHC: 34.9 g/dL (ref 30.0–36.0)
MCV: 86.6 fL (ref 78.0–100.0)
PLATELETS: 243 10*3/uL (ref 150–400)
RBC: 5.43 MIL/uL (ref 4.22–5.81)
RDW: 13 % (ref 11.5–15.5)
WBC: 5.7 10*3/uL (ref 4.0–10.5)

## 2017-06-03 LAB — I-STAT TROPONIN, ED: Troponin i, poc: 0 ng/mL (ref 0.00–0.08)

## 2017-06-03 NOTE — Discharge Instructions (Signed)
Drink minimal caffeine.  Follow-up with your family doctor next week for recheck return if problems

## 2017-06-03 NOTE — ED Triage Notes (Addendum)
Pt reports that he was talking to a therapist about his son's OD when he felt his heart racing.  Reports that he went on to work to pick up a part and heart was still racing again they called 911. EMS did EKG and told him looked good but they arent able to do blood work to rule out MI. Patient came in with no shoes. Patient ask, "Can Viagra make this happen?" reports that this morning he took it to try to relieve stress with his wife.

## 2017-06-03 NOTE — ED Provider Notes (Signed)
[facility] DEPT Provider Note   CSN: [id number] Arrival date & time: 06/03/17  1415     History   Chief Complaint Chief Complaint  Patient presents with  . Palpitations    HPI Jacob [name] is a 52 y.o. male.  Patient complains of palpitations today.  Patient states that his son had an overdose yesterday but has been revived at the hospital and is at home now.   The history is provided by the patient.  Palpitations   This is a new problem. The current episode started 6 to 12 hours ago. The problem occurs rarely. The problem has been resolved. The problem is associated with stress. Pertinent negatives include no diaphoresis, no chest pain, no abdominal pain, no headaches, no back pain and no cough.    Past Medical History:  Diagnosis Date  . Allergy   . Anxiety   . Cholesteatoma of right ear   . Diabetes mellitus ([place])    bordreline  . Enlarged prostate   . GERD (gastroesophageal reflux disease)   . Hernia of abdominal wall   . Hyperlipidemia 10/18/2014  . Hypertension     Patient Active Problem List   Diagnosis Date Noted  . Colon cancer screening 04/09/2017  . Nausea without vomiting 04/09/2017  . Dysphagia 04/09/2017  . Dyspepsia 11/19/2016  . Right otitis media 09/09/2016  . Vertigo 09/09/2016  . Hearing loss, right 09/09/2016  . BPH (benign prostatic hyperplasia) 05/30/2016  . Right shoulder pain 05/30/2016  . Acute midline low back pain with sciatica 04/21/2016  . 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 04/21/2016  . Urinary frequency 02/05/2016  . Syncope 09/27/2015  . Left lateral epicondylitis 08/31/2015  . Allergic rhinitis 08/29/2015  . Bilateral hearing loss 06/05/2015  . Hypersomnolence 06/05/2015  . Chest pain 01/23/2015  . Chronic pain syndrome 01/23/2015  . Hyperlipidemia 10/18/2014  . Impingement syndrome of right shoulder 06/28/2014  . Hip flexor tendon tightness 06/28/2014  . Right knee pain  06/28/2014  . Depression with anxiety 01/30/2013  . Diabetes mellitus without complication ([place]) 38/12/1749  . Encounter for preventative adult health care exam with abnormal findings 01/11/2013  . Essential hypertension, benign 01/11/2013  . Low back pain syndrome 01/11/2013  . DJD (degenerative joint disease) 01/11/2013  . Erectile dysfunction 01/11/2013  . GERD (gastroesophageal reflux disease) 12/30/2011    Past Surgical History:  Procedure Laterality Date  . CHOLECYSTECTOMY    . [place]  . INNER EAR SURGERY     right  . LUMBAR DISC SURGERY    . tubes in bil ears         Home Medications    Prior to Admission medications   Medication Sig Start Date End Date Taking? Authorizing Provider  ALPRAZolam Duanne Moron) 0.5 MG tablet TAKE 1-2 TABLET BY MOUTH AT BEDTIME AS NEEDED Patient taking differently: TAKE 1-2 TABLET BY MOUTH AT BEDTIME 01/07/17   Biagio Borg, MD  aspirin EC 81 MG tablet Take 1 tablet (81 mg total) by mouth daily. 05/09/14   Biagio Borg, MD  metFORMIN (GLUCOPHAGE-XR) 500 MG 24 hr tablet Take 3 tablets (1,500 mg total) by mouth daily with breakfast. 05/29/16   Biagio Borg, MD  Multiple Vitamin (MULTIVITAMIN WITH MINERALS) TABS tablet Take 1 tablet by mouth daily.    [provider]  Omega-3 Fatty Acids (FISH OIL PO) Take 1 capsule by mouth daily.    [provider]  pantoprazole (PROTONIX) 40  MG tablet Take 1 tablet (40 mg total) by mouth daily. 11/19/16   Biagio Borg, MD  ramipril (ALTACE) 10 MG capsule TAKE ONE CAPSULE BY MOUTH EVERY DAY 05/13/17   Biagio Borg, MD  tadalafil (CIALIS) 20 MG tablet Take 1 tablet (20 mg total) by mouth daily as needed for erectile dysfunction. 11/18/16 12/18/16  Biagio Borg, MD    Family History Family History  Problem Relation Age of Onset  . COPD Mother   . Hypertension Mother   . Heart disease Father   . Diabetes Paternal Grandmother   . Diabetes Maternal Grandmother   . Lung  cancer Maternal Uncle   . Colon cancer Neg Hx   . Esophageal cancer Neg Hx   . Rectal cancer Neg Hx   . Stomach cancer Neg Hx     Social History Social History   Tobacco Use  . Smoking status: Never Smoker  . Smokeless tobacco: Never Used  Substance Use Topics  . Alcohol use: Yes    Alcohol/week: 3.0 oz    Types: 5 Glasses of wine per week    Comment: social  . Drug use: No     Allergies   Patient has no known allergies.   Review of Systems Review of Systems  Constitutional: Negative for appetite change, diaphoresis and fatigue.  HENT: Negative for congestion, ear discharge and sinus pressure.   Eyes: Negative for discharge.  Respiratory: Negative for cough.   Cardiovascular: Positive for palpitations. Negative for chest pain.  Gastrointestinal: Negative for abdominal pain and diarrhea.  Genitourinary: Negative for frequency and hematuria.  Musculoskeletal: Negative for back pain.  Skin: Negative for rash.  Neurological: Negative for seizures and headaches.  Psychiatric/Behavioral: Negative for hallucinations.     Physical Exam Updated Vital Signs BP 138/85 (BP Location: Left Arm)   Pulse 61   Temp 98.5 F (36.9 C) (Oral)   Resp 12   SpO2 100%   Physical Exam  Constitutional: He is oriented to person, place, and time. He appears well-developed.  HENT:  Head: Normocephalic.  Eyes: Conjunctivae and EOM are normal. No scleral icterus.  Neck: Neck supple. No thyromegaly present.  Cardiovascular: Normal rate and regular rhythm. Exam reveals no gallop and no friction rub.  No murmur heard. Pulmonary/Chest: No stridor. He has no wheezes. He has no rales. He exhibits no tenderness.  Abdominal: He exhibits no distension. There is no tenderness. There is no rebound.  Musculoskeletal: Normal range of motion. He exhibits no edema.  Lymphadenopathy:    He has no cervical adenopathy.  Neurological: He is oriented to person, place, and time. He exhibits normal muscle  tone. Coordination normal.  Skin: No rash noted. No erythema.  Psychiatric: He has a normal mood and affect. His behavior is normal.     ED Treatments / Results  Labs (all labs ordered are listed, but only abnormal results are displayed) Labs Reviewed  BASIC METABOLIC PANEL - Abnormal; Notable for the following components:      Result Value   Glucose, Bld 124 (*)    Creatinine, Ser 1.26 (*)    All other components within normal limits  CBC  I-STAT TROPONIN, ED    EKG  EKG Interpretation  Date/Time:  Thursday June 03 2017 14:28:36 EST Ventricular Rate:  96 PR Interval:    QRS Duration: 78 QT Interval:  319 QTC Calculation: 404 R Axis:   69 Text Interpretation:  Sinus rhythm Confirmed by Milton Ferguson [id number]) on 06/03/2017 8:44:58  PM Also confirmed by Milton Ferguson [phone])  on 06/03/2017 9:20:15 PM       Radiology Dg Chest 2 View  Result Date: 06/03/2017 CLINICAL DATA:  Palpitations. EXAM: CHEST - 2 VIEW COMPARISON:  Chest x-ray dated September 27, 2015. FINDINGS: The heart size and mediastinal contours are within normal limits. Both lungs are clear. The visualized skeletal structures are unremarkable. IMPRESSION: No active cardiopulmonary disease. Electronically Signed   By: Titus Dubin M.D.   On: 06/03/2017 15:00    Procedures Procedures (including critical care time)  Medications Ordered in ED Medications - No data to display   Initial Impression / Assessment and Plan / ED Course  I have reviewed the triage vital signs and the nursing notes.  Pertinent labs & imaging results that were available during my care of the patient were reviewed by me and considered in my medical decision making (see chart for details).     Patient with palpitations today.  CBC chemistries troponin chest x-ray EKG all unremarkable.  Patient is under a lot of stress because of the recent overdose of his son.  Suspect palpitations are related to stress.  He is told to limit caffeine intake  and follow-up with his PCP next week  Final Clinical Impressions(s) / ED Diagnoses   Final diagnoses:  Palpitations    ED Discharge Orders    None       Milton Ferguson, MD 06/03/17 2135

## 2017-06-09 ENCOUNTER — Ambulatory Visit: Payer: 59 | Admitting: Internal Medicine

## 2017-06-09 ENCOUNTER — Encounter: Payer: Self-pay | Admitting: Internal Medicine

## 2017-06-09 VITALS — BP 114/78 | HR 72 | Temp 98.0°F | Ht 72.0 in | Wt 176.0 lb

## 2017-06-09 DIAGNOSIS — E119 Type 2 diabetes mellitus without complications: Secondary | ICD-10-CM

## 2017-06-09 DIAGNOSIS — Z0000 Encounter for general adult medical examination without abnormal findings: Secondary | ICD-10-CM

## 2017-06-09 DIAGNOSIS — I10 Essential (primary) hypertension: Secondary | ICD-10-CM

## 2017-06-09 DIAGNOSIS — F418 Other specified anxiety disorders: Secondary | ICD-10-CM | POA: Diagnosis not present

## 2017-06-09 DIAGNOSIS — J019 Acute sinusitis, unspecified: Secondary | ICD-10-CM

## 2017-06-09 LAB — POCT GLYCOSYLATED HEMOGLOBIN (HGB A1C): HEMOGLOBIN A1C: 6.3

## 2017-06-09 MED ORDER — RAMIPRIL 10 MG PO CAPS: 10.0000 mg | ORAL_CAPSULE | Freq: Every day | ORAL | 3 refills | Status: DC

## 2017-06-09 MED ORDER — METFORMIN HCL ER 500 MG PO TB24: 1500.0000 mg | ORAL_TABLET | Freq: Every day | ORAL | 3 refills | Status: DC

## 2017-06-09 MED ORDER — PANTOPRAZOLE SODIUM 40 MG PO TBEC: 40.0000 mg | DELAYED_RELEASE_TABLET | Freq: Every day | ORAL | 3 refills | Status: DC

## 2017-06-09 MED ORDER — AZITHROMYCIN 250 MG PO TABS: ORAL_TABLET | ORAL | 1 refills | Status: DC

## 2017-06-09 NOTE — Patient Instructions (Addendum)
Your A1c was improved today to 6.3  Please take all new medication as prescribed - the antibiotic  Please continue all other medications as before, and refills have been done if requested - the metfromin, ramipril, and protonix  Please have the pharmacy call with any other refills you may need.  Please continue your efforts at being more active, low cholesterol diabetic diet, and weight control.  Please keep your appointments with your specialists as you may have planned  Please return in 6 months, or sooner if needed, with Lab testing done 3-5 days before

## 2017-06-09 NOTE — Progress Notes (Signed)
Subjective:    Patient ID: Jacob [name], male    DOB: [birth year], 52 y.o.   MRN: [id number]  HPI  Here after son with recent OD last weekend but survived, much stress at the moment.  Had a panic attack yesterday for several hours with palpitations, heart racing and  BP 190/96 per EMS on arrival.  Could not find his own xanax, girlfriend gave him one of hers but not sure dose.  At ED BP improved, and routine ecg, labs were reassuring.  Incidentally now today c/on  Here with 2-3 days acute onset fever, facial pain, pressure, headache, general weakness and malaise, and greenish d/c, with mild ST and cough, but pt denies chest pain, wheezing, increased sob or doe, orthopnea, PND, increased LE swelling, dizziness or syncope.   Pt denies polydipsia, polyuria Past Medical History:  Diagnosis Date  . Allergy   . Anxiety   . Cholesteatoma of right ear   . 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 Enlarged prostate   . GERD (gastroesophageal reflux disease)   . Hernia of abdominal wall   . Hyperlipidemia 10/18/2014  . Hypertension    Past Surgical History:  Procedure Laterality Date  . CHOLECYSTECTOMY    . [place]  . INNER EAR SURGERY     right  . LUMBAR DISC SURGERY    . tubes in bil ears      reports that  has never smoked. he has never used smokeless tobacco. He reports that he drinks about 3.0 oz of alcohol per week. He reports that he does not use drugs. family history includes COPD in his mother; Diabetes in his maternal grandmother and paternal grandmother; Heart disease in his father; Hypertension in his mother; Lung cancer in his maternal uncle. No Known Allergies Current Outpatient Medications on File Prior to Visit  Medication Sig Dispense Refill  . ALPRAZolam (XANAX) 0.5 MG tablet TAKE 1-2 TABLET BY MOUTH AT BEDTIME AS NEEDED (Patient taking differently: TAKE 1-2 TABLET BY MOUTH AT BEDTIME) 60 tablet 5  . aspirin EC 81 MG tablet Take 1 tablet (81 mg  total) by mouth daily. 90 tablet 11  . Multiple Vitamin (MULTIVITAMIN WITH MINERALS) TABS tablet Take 1 tablet by mouth daily.    . Omega-3 Fatty Acids (FISH OIL PO) Take 1 capsule by mouth daily.    . tadalafil (CIALIS) 20 MG tablet Take 1 tablet (20 mg total) by mouth daily as needed for erectile dysfunction. 10 tablet 11   No current facility-administered medications on file prior to visit.    Review of Systems  Constitutional: Negative for other unusual diaphoresis or sweats HENT: Negative for ear discharge or swelling Eyes: Negative for other worsening visual disturbances Respiratory: Negative for stridor or other swelling  Gastrointestinal: Negative for worsening distension or other blood Genitourinary: Negative for retention or other urinary change Musculoskeletal: Negative for other MSK pain or swelling Skin: Negative for color change or other new lesions Neurological: Negative for worsening tremors and other numbness  Psychiatric/Behavioral: Negative for worsening agitation or other fatigue All other system neg per pt    Objective:   Physical Exam BP 114/78   Pulse 72   Temp 98 F (36.7 C) (Oral)   Ht 6' (1.829 m)   Wt 176 lb (79.8 kg)   SpO2 99%   BMI 23.87 kg/m  VS noted, mild ill Constitutional: Pt appears in NAD HENT: Head: NCAT.  Right Ear: External ear normal.  Left Ear: External ear normal.  Eyes: . Pupils are equal, round, and reactive to light. Conjunctivae and EOM are normal Bilat tm's with mild erythema.  Max sinus areas mild tender.  Pharynx with mild erythema, no exudate Nose: without d/c or deformity Neck: Neck supple. Gross normal ROM Cardiovascular: Normal rate and regular rhythm.   Pulmonary/Chest: Effort normal and breath sounds without rales or wheezing.  Abd:  Soft, NT, ND, + BS, no organomegaly Neurological: Pt is alert. At baseline orientation, motor grossly intact Skin: Skin is warm. No rashes, other new lesions, no LE edema Psychiatric: Pt  behavior is normal without agitation , 2+ nervous No other exam findings  Contains abnormal data POCT glycosylated hemoglobin (Hb A1C)  Component 1d ago  Hemoglobin A1C 6.3            Assessment & Plan:

## 2017-06-11 DIAGNOSIS — J019 Acute sinusitis, unspecified: Secondary | ICD-10-CM | POA: Insufficient documentation

## 2017-06-11 NOTE — Assessment & Plan Note (Signed)
Mild to mod, for antibx course,  to f/u any worsening symptoms or concerns 

## 2017-06-11 NOTE — Assessment & Plan Note (Signed)
With recent acute worsening, reassured, cont current tx, declines referral for counseling

## 2017-06-11 NOTE — Assessment & Plan Note (Signed)
stable overall by history and exam, recent data reviewed with pt, and pt to continue medical treatment as before,  to f/u any worsening symptoms or concerns Lab Results  Component Value Date   HGBA1C 6.3 06/09/2017   

## 2017-06-11 NOTE — Assessment & Plan Note (Signed)
stable overall by history and exam, recent data reviewed with pt, and pt to continue medical treatment as before,  to f/u any worsening symptoms or concerns BP Readings from Last 3 Encounters:  06/09/17 114/78  06/03/17 133/78  04/09/17 122/70

## 2017-08-26 ENCOUNTER — Other Ambulatory Visit: Payer: Self-pay | Admitting: Internal Medicine

## 2017-08-26 NOTE — Telephone Encounter (Signed)
I checked this medication in the database for this patient, however it is not a drug listed for him that he has ever filled.

## 2017-08-26 NOTE — Telephone Encounter (Signed)
Done erx 

## 2017-09-17 ENCOUNTER — Encounter (HOSPITAL_COMMUNITY): Payer: Self-pay | Admitting: Emergency Medicine

## 2017-09-17 ENCOUNTER — Emergency Department (HOSPITAL_COMMUNITY)
Admission: EM | Admit: 2017-09-17 | Discharge: 2017-09-18 | Disposition: A | Discharge status: Home / Self Care | Payer: 59 | Attending: Emergency Medicine | Admitting: Emergency Medicine

## 2017-09-17 ENCOUNTER — Emergency Department (HOSPITAL_COMMUNITY): Payer: 59

## 2017-09-17 DIAGNOSIS — Z9049 Acquired absence of other specified parts of digestive tract: Secondary | ICD-10-CM | POA: Insufficient documentation

## 2017-09-17 DIAGNOSIS — F329 Major depressive disorder, single episode, unspecified: Secondary | ICD-10-CM | POA: Insufficient documentation

## 2017-09-17 DIAGNOSIS — Z7984 Long term (current) use of oral hypoglycemic drugs: Secondary | ICD-10-CM | POA: Insufficient documentation

## 2017-09-17 DIAGNOSIS — E119 Type 2 diabetes mellitus without complications: Secondary | ICD-10-CM | POA: Diagnosis not present

## 2017-09-17 DIAGNOSIS — R51 Headache: Secondary | ICD-10-CM | POA: Diagnosis present

## 2017-09-17 DIAGNOSIS — Z79899 Other long term (current) drug therapy: Secondary | ICD-10-CM | POA: Diagnosis not present

## 2017-09-17 DIAGNOSIS — Z7982 Long term (current) use of aspirin: Secondary | ICD-10-CM | POA: Diagnosis not present

## 2017-09-17 DIAGNOSIS — F419 Anxiety disorder, unspecified: Secondary | ICD-10-CM | POA: Diagnosis not present

## 2017-09-17 DIAGNOSIS — I10 Essential (primary) hypertension: Secondary | ICD-10-CM | POA: Insufficient documentation

## 2017-09-17 DIAGNOSIS — R519 Headache, unspecified: Secondary | ICD-10-CM

## 2017-09-17 LAB — CBC WITH DIFFERENTIAL/PLATELET
Basophils Absolute: 0 10*3/uL (ref 0.0–0.1)
Basophils Relative: 0 %
Eosinophils Absolute: 0.1 10*3/uL (ref 0.0–0.7)
Eosinophils Relative: 2 %
HCT: 45.2 % (ref 39.0–52.0)
Hemoglobin: 16.1 g/dL (ref 13.0–17.0)
Lymphocytes Relative: 45 %
Lymphs Abs: 2 10*3/uL (ref 0.7–4.0)
MCH: 31 pg (ref 26.0–34.0)
MCHC: 35.6 g/dL (ref 30.0–36.0)
MCV: 86.9 fL (ref 78.0–100.0)
Monocytes Absolute: 0.3 10*3/uL (ref 0.1–1.0)
Monocytes Relative: 8 %
Neutro Abs: 2 10*3/uL (ref 1.7–7.7)
Neutrophils Relative %: 45 %
Platelets: 205 10*3/uL (ref 150–400)
RBC: 5.2 MIL/uL (ref 4.22–5.81)
RDW: 12.6 % (ref 11.5–15.5)
WBC: 4.4 10*3/uL (ref 4.0–10.5)

## 2017-09-17 LAB — URINALYSIS, ROUTINE W REFLEX MICROSCOPIC
Bilirubin Urine: NEGATIVE
Glucose, UA: NEGATIVE mg/dL
Hgb urine dipstick: NEGATIVE
Ketones, ur: 5 mg/dL — AB
Leukocytes, UA: NEGATIVE
Nitrite: NEGATIVE
Protein, ur: NEGATIVE mg/dL
Specific Gravity, Urine: 1.033 — ABNORMAL HIGH (ref 1.005–1.030)
pH: 5 (ref 5.0–8.0)

## 2017-09-17 LAB — BASIC METABOLIC PANEL
Anion gap: 9 (ref 5–15)
BUN: 22 mg/dL — ABNORMAL HIGH (ref 6–20)
CO2: 26 mmol/L (ref 22–32)
Calcium: 9.4 mg/dL (ref 8.9–10.3)
Chloride: 105 mmol/L (ref 101–111)
Creatinine, Ser: 1.24 mg/dL (ref 0.61–1.24)
GFR calc Af Amer: >60 mL/min (ref >60)
GFR calc non Af Amer: >60 mL/min (ref >60)
Glucose, Bld: 129 mg/dL — ABNORMAL HIGH (ref 65–99)
Potassium: 3.9 mmol/L (ref 3.5–5.1)
Sodium: 140 mmol/L (ref 135–145)

## 2017-09-17 MED ORDER — DIPHENHYDRAMINE HCL 50 MG/ML IJ SOLN
25.0000 mg | Freq: Once | INTRAMUSCULAR | Status: AC
  Administered 2017-09-17: 25 mg via INTRAVENOUS
  Filled 2017-09-17: qty 1

## 2017-09-17 MED ORDER — PROCHLORPERAZINE EDISYLATE 10 MG/2ML IJ SOLN
10.0000 mg | Freq: Once | INTRAMUSCULAR | Status: AC
  Administered 2017-09-17: 10 mg via INTRAVENOUS
  Filled 2017-09-17: qty 2

## 2017-09-17 MED ORDER — KETOROLAC TROMETHAMINE 30 MG/ML IJ SOLN
30.0000 mg | Freq: Once | INTRAMUSCULAR | Status: AC
  Administered 2017-09-17: 30 mg via INTRAVENOUS
  Filled 2017-09-17: qty 1

## 2017-09-17 MED ORDER — SODIUM CHLORIDE 0.9 % IV BOLUS
2000.0000 mL | Freq: Once | INTRAVENOUS | Status: AC
  Administered 2017-09-17: 2000 mL via INTRAVENOUS

## 2017-09-17 MED ORDER — IBUPROFEN 800 MG PO TABS: 800.0000 mg | ORAL_TABLET | Freq: Three times a day (TID) | ORAL | 0 refills | Status: DC | PRN

## 2017-09-17 NOTE — ED Notes (Signed)
Pt aware urine sample is needed 

## 2017-09-17 NOTE — Discharge Instructions (Addendum)
Return here as needed.  Follow-up with your primary doctor. °

## 2017-09-17 NOTE — ED Triage Notes (Signed)
Pt reports headache that started about 45 minutes ago with nausea and blurred vision. Denies hx of migraines or falls. Repots this whole week has been sensitive to light. Took 4 ibuprofen without any relief and putting ice pack on back of neck.

## 2017-09-17 NOTE — ED Provider Notes (Signed)
[facility] DEPT Provider Note   CSN: [id number] Arrival date & time: 09/17/17  1825     History   Chief Complaint Chief Complaint  Patient presents with  . Headache  . Nausea    HPI Jacob [name] is a 52 y.o. male.  HPI Patient presents to the emergency department with headache that started around 6 PM.  The patient states that he does not have a significant history of migraines or headaches.  The patient states that nothing seemed to make the condition better but light seemed to make the headache worse.  Patient states it seems to be a diffuse headache.  Patient states he did not take any medications prior to arrival.  The patient denies chest pain, shortness of breath, blurred vision, neck pain, fever, cough, weakness, numbness, dizziness, anorexia, edema, abdominal pain, nausea, vomiting, diarrhea, rash, back pain, dysuria, hematemesis, bloody stool, near syncope, or syncope. Past Medical History:  Diagnosis Date  . Allergy   . Anxiety   . Cholesteatoma of right ear   . 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 Enlarged prostate   . GERD (gastroesophageal reflux disease)   . Hernia of abdominal wall   . Hyperlipidemia 10/18/2014  . Hypertension     Patient Active Problem List   Diagnosis Date Noted  . Acute sinus infection 06/11/2017  . Colon cancer screening 04/09/2017  . Nausea without vomiting 04/09/2017  . Dysphagia 04/09/2017  . Dyspepsia 11/19/2016  . Right otitis media 09/09/2016  . Vertigo 09/09/2016  . Hearing loss, right 09/09/2016  . BPH (benign prostatic hyperplasia) 05/30/2016  . Right shoulder pain 05/30/2016  . Acute midline low back pain with sciatica 04/21/2016  . 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 04/21/2016  . Urinary frequency 02/05/2016  . Syncope 09/27/2015  . Left lateral epicondylitis 08/31/2015  . Allergic rhinitis 08/29/2015  . Bilateral hearing loss 06/05/2015  . Hypersomnolence  06/05/2015  . Chest pain 01/23/2015  . Chronic pain syndrome 01/23/2015  . Hyperlipidemia 10/18/2014  . Impingement syndrome of right shoulder 06/28/2014  . Hip flexor tendon tightness 06/28/2014  . Right knee pain 06/28/2014  . Depression with anxiety 01/30/2013  . Diabetes mellitus without complication ([place]) 21/19/4174  . Encounter for preventative adult health care exam with abnormal findings 01/11/2013  . Essential hypertension, benign 01/11/2013  . Low back pain syndrome 01/11/2013  . DJD (degenerative joint disease) 01/11/2013  . Erectile dysfunction 01/11/2013  . GERD (gastroesophageal reflux disease) 12/30/2011    Past Surgical History:  Procedure Laterality Date  . CHOLECYSTECTOMY    . [place]  . INNER EAR SURGERY     right  . LUMBAR DISC SURGERY    . tubes in bil ears          Home Medications    Prior to Admission medications   Medication Sig Start Date End Date Taking? Authorizing Provider  ALPRAZolam Duanne Moron) 0.5 MG tablet TAKE 1 TO 2 TABLETS BY MOUTH AT BEDTIME AS NEEDED 08/26/17  Yes Biagio Borg, MD  aspirin EC 81 MG tablet Take 1 tablet (81 mg total) by mouth daily. 05/09/14  Yes Biagio Borg, MD  metFORMIN (GLUCOPHAGE-XR) 500 MG 24 hr tablet Take 3 tablets (1,500 mg total) by mouth daily with breakfast. 06/09/17  Yes Biagio Borg, MD  Multiple Vitamin (MULTIVITAMIN WITH MINERALS) TABS tablet Take 1 tablet by mouth daily.   Yes [provider]  Omega-3 Fatty Acids (  FISH OIL PO) Take 1 capsule by mouth daily.   Yes [provider]  pantoprazole (PROTONIX) 40 MG tablet Take 1 tablet (40 mg total) by mouth daily. 06/09/17  Yes Biagio Borg, MD  ramipril (ALTACE) 10 MG capsule Take 1 capsule (10 mg total) by mouth daily. 06/09/17  Yes Biagio Borg, MD  tadalafil (CIALIS) 20 MG tablet Take 1 tablet (20 mg total) by mouth daily as needed for erectile dysfunction. 11/18/16 09/17/17 Yes Biagio Borg, MD  azithromycin  (ZITHROMAX Z-PAK) 250 MG tablet 2 tab by mouth day 1, then 1 per day Patient not taking: Reported on 09/17/2017 06/09/17   Biagio Borg, MD    Family History Family History  Problem Relation Age of Onset  . COPD Mother   . Hypertension Mother   . Heart disease Father   . Diabetes Paternal Grandmother   . Diabetes Maternal Grandmother   . Lung cancer Maternal Uncle   . Colon cancer Neg Hx   . Esophageal cancer Neg Hx   . Rectal cancer Neg Hx   . Stomach cancer Neg Hx     Social History Social History   Tobacco Use  . Smoking status: Never Smoker  . Smokeless tobacco: Never Used  Substance Use Topics  . Alcohol use: Yes    Alcohol/week: 3.0 oz    Types: 5 Glasses of wine per week    Comment: social  . Drug use: No     Allergies   Patient has no known allergies.   Review of Systems Review of Systems  All other systems negative except as documented in the HPI. All pertinent positives and negatives as reviewed in the HPI. Physical Exam Updated Vital Signs BP 125/71   Pulse (!) 53   Temp 98 F (36.7 C) (Oral)   Resp 17   Ht 6' (1.829 m)   Wt 76.7 kg (169 lb)   SpO2 100%   BMI 22.92 kg/m   Physical Exam  Constitutional: He is oriented to person, place, and time. He appears well-developed and well-nourished. No distress.  HENT:  Head: Normocephalic and atraumatic.  Mouth/Throat: Oropharynx is clear and moist.  Eyes: Pupils are equal, round, and reactive to light.  Neck: Normal range of motion. Neck supple. No neck rigidity. Normal range of motion present. No Brudzinski's sign and no Kernig's sign noted.  Cardiovascular: Normal rate, regular rhythm and normal heart sounds. Exam reveals no gallop and no friction rub.  No murmur heard. Pulmonary/Chest: Effort normal and breath sounds normal. No respiratory distress. He has no wheezes.  Abdominal: Soft. Bowel sounds are normal. He exhibits no distension. There is no tenderness.  Neurological: He is alert and  oriented to person, place, and time. He exhibits normal muscle tone. Coordination normal.  Psychiatric: He has a normal mood and affect. His behavior is normal.  Nursing note and vitals reviewed.    ED Treatments / Results  Labs (all labs ordered are listed, but only abnormal results are displayed) Labs Reviewed  BASIC METABOLIC PANEL - Abnormal; Notable for the following components:      Result Value   Glucose, Bld 129 (*)    BUN 22 (*)    All other components within normal limits  URINALYSIS, ROUTINE W REFLEX MICROSCOPIC - Abnormal; Notable for the following components:   Specific Gravity, Urine 1.033 (*)    Ketones, ur 5 (*)    All other components within normal limits  CBC WITH DIFFERENTIAL/PLATELET  EKG None  Radiology Ct Head Wo Contrast  Result Date: 09/17/2017 CLINICAL DATA:  52 year old male with acute onset headache, nausea and blurred vision. EXAM: CT HEAD WITHOUT CONTRAST TECHNIQUE: Contiguous axial images were obtained from the base of the skull through the vertex without intravenous contrast. COMPARISON:  Brain MRI, intracranial MRA, and Head CT 09/27/2015 FINDINGS: Brain: Normal cerebral volume. No midline shift, ventriculomegaly, mass effect, evidence of mass lesion, intracranial hemorrhage or evidence of cortically based acute infarction. Gray-white matter differentiation is within normal limits throughout the brain. Vascular: Mild Calcified atherosclerosis at the skull base. No suspicious intracranial vascular hyperdensity. Skull: Negative. Sinuses/Orbits: Previous right mastoidectomy, unchanged. Visualized paranasal sinuses and left mastoids are stable and well pneumatized. Other: Visualized orbits and scalp soft tissues are within normal limits. IMPRESSION: 1. Stable and normal noncontrast CT appearance of the brain. 2. Unchanged previous right mastoidectomy. Electronically Signed   By: Genevie Ann M.D.   On: 09/17/2017 21:43    Procedures Procedures (including  critical care time)  Medications Ordered in ED Medications  sodium chloride 0.9 % bolus 2,000 mL (2,000 mLs Intravenous New Bag/Given 09/17/17 2123)  prochlorperazine (COMPAZINE) injection 10 mg (10 mg Intravenous Given 09/17/17 2215)  ketorolac (TORADOL) 30 MG/ML injection 30 mg (30 mg Intravenous Given 09/17/17 2215)  diphenhydrAMINE (BENADRYL) injection 25 mg (25 mg Intravenous Given 09/17/17 2215)     Initial Impression / Assessment and Plan / ED Course  I have reviewed the triage vital signs and the nursing notes.  Pertinent labs & imaging results that were available during my care of the patient were reviewed by me and considered in my medical decision making (see chart for details).    Patient will be discharged home.  He has no neurological deficits noted on his exam.  The patient's CT scan did not show any significant abnormality.  The patient is having complete resolution of his symptoms.  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  The patient is somewhat dehydrated I feel and had a migraine type headache.  The patient is advised to return for any worsening in his condition.  At this point like it so there is no neurological deficits and he has no nuchal rigidity.    Final Clinical Impressions(s) / ED Diagnoses   Final diagnoses:  None    ED Discharge Orders    None       Rebeca Allegra 09/17/17 2356    Quintella Reichert, MD 09/19/17 [phone]

## 2017-10-08 ENCOUNTER — Other Ambulatory Visit: Payer: Self-pay | Admitting: Internal Medicine

## 2017-10-08 ENCOUNTER — Encounter: Payer: Self-pay | Admitting: Internal Medicine

## 2017-10-08 ENCOUNTER — Ambulatory Visit (INDEPENDENT_AMBULATORY_CARE_PROVIDER_SITE_OTHER): Payer: 59 | Admitting: Internal Medicine

## 2017-10-08 ENCOUNTER — Other Ambulatory Visit (INDEPENDENT_AMBULATORY_CARE_PROVIDER_SITE_OTHER): Payer: 59

## 2017-10-08 VITALS — BP 110/64 | HR 66 | Ht 72.0 in | Wt 174.0 lb

## 2017-10-08 DIAGNOSIS — E119 Type 2 diabetes mellitus without complications: Secondary | ICD-10-CM

## 2017-10-08 DIAGNOSIS — Z0000 Encounter for general adult medical examination without abnormal findings: Secondary | ICD-10-CM

## 2017-10-08 DIAGNOSIS — Z114 Encounter for screening for human immunodeficiency virus [HIV]: Secondary | ICD-10-CM

## 2017-10-08 LAB — URINALYSIS, ROUTINE W REFLEX MICROSCOPIC
Bilirubin Urine: NEGATIVE
Hgb urine dipstick: NEGATIVE
Ketones, ur: NEGATIVE
LEUKOCYTES UA: NEGATIVE
Nitrite: NEGATIVE
Total Protein, Urine: NEGATIVE
Urine Glucose: NEGATIVE
Urobilinogen, UA: 0.2 (ref 0.0–1.0)
WBC, UA: NONE SEEN — AB (ref 0–?)
pH: 6.5 (ref 5.0–8.0)

## 2017-10-08 LAB — CBC WITH DIFFERENTIAL/PLATELET
BASOS PCT: 0.6 % (ref 0.0–3.0)
Basophils Absolute: 0 10*3/uL (ref 0.0–0.1)
EOS ABS: 0.1 10*3/uL (ref 0.0–0.7)
Eosinophils Relative: 1.4 % (ref 0.0–5.0)
HEMATOCRIT: 45.5 % (ref 39.0–52.0)
HEMOGLOBIN: 15.8 g/dL (ref 13.0–17.0)
LYMPHS PCT: 33.6 % (ref 12.0–46.0)
Lymphs Abs: 1.9 10*3/uL (ref 0.7–4.0)
MCHC: 34.7 g/dL (ref 30.0–36.0)
MCV: 86.2 fl (ref 78.0–100.0)
Monocytes Absolute: 0.4 10*3/uL (ref 0.1–1.0)
Monocytes Relative: 7.3 % (ref 3.0–12.0)
Neutro Abs: 3.2 10*3/uL (ref 1.4–7.7)
Neutrophils Relative %: 57.1 % (ref 43.0–77.0)
Platelets: 219 10*3/uL (ref 150.0–400.0)
RBC: 5.28 Mil/uL (ref 4.22–5.81)
RDW: 13 % (ref 11.5–15.5)
WBC: 5.6 10*3/uL (ref 4.0–10.5)

## 2017-10-08 LAB — BASIC METABOLIC PANEL
BUN: 14 mg/dL (ref 6–23)
CALCIUM: 9.7 mg/dL (ref 8.4–10.5)
CO2: 29 mEq/L (ref 19–32)
CREATININE: 1.16 mg/dL (ref 0.40–1.50)
Chloride: 103 mEq/L (ref 96–112)
GFR: 70.68 mL/min (ref >60.00)
GLUCOSE: 96 mg/dL (ref 70–99)
Potassium: 4.8 mEq/L (ref 3.5–5.1)
SODIUM: 140 meq/L (ref 135–145)

## 2017-10-08 LAB — MICROALBUMIN / CREATININE URINE RATIO
Creatinine,U: 46.2 mg/dL
MICROALB/CREAT RATIO: 1.5 mg/g (ref 0.0–30.0)
Microalb, Ur: <0.7 mg/dL (ref 0.0–1.9)

## 2017-10-08 LAB — LIPID PANEL
CHOL/HDL RATIO: 3
Cholesterol: 177 mg/dL (ref 0–200)
HDL: 57.6 mg/dL (ref >39.00)
LDL Cholesterol: 108 mg/dL — ABNORMAL HIGH (ref 0–99)
NONHDL: 119.81
Triglycerides: 57 mg/dL (ref 0.0–149.0)
VLDL: 11.4 mg/dL (ref 0.0–40.0)

## 2017-10-08 LAB — HEPATIC FUNCTION PANEL
ALK PHOS: 69 U/L (ref 39–117)
ALT: 22 U/L (ref 0–53)
AST: 21 U/L (ref 0–37)
Albumin: 4.6 g/dL (ref 3.5–5.2)
BILIRUBIN TOTAL: 0.8 mg/dL (ref 0.2–1.2)
Bilirubin, Direct: 0.2 mg/dL (ref 0.0–0.3)
Total Protein: 7.4 g/dL (ref 6.0–8.3)

## 2017-10-08 LAB — HEMOGLOBIN A1C: HEMOGLOBIN A1C: 6.4 % (ref 4.6–6.5)

## 2017-10-08 LAB — PSA: PSA: 1.05 ng/mL (ref 0.10–4.00)

## 2017-10-08 LAB — TSH: TSH: 1.19 u[IU]/mL (ref 0.35–4.50)

## 2017-10-08 MED ORDER — ROSUVASTATIN CALCIUM 10 MG PO TABS: 10.0000 mg | ORAL_TABLET | Freq: Every day | ORAL | 3 refills | Status: DC

## 2017-10-08 MED ORDER — TADALAFIL 20 MG PO TABS: 20.0000 mg | ORAL_TABLET | Freq: Every day | ORAL | 11 refills | Status: DC | PRN

## 2017-10-08 MED ORDER — TRIAMCINOLONE ACETONIDE 55 MCG/ACT NA AERO: 2.0000 | INHALATION_SPRAY | Freq: Every day | NASAL | 12 refills | Status: DC

## 2017-10-08 NOTE — Assessment & Plan Note (Signed)

## 2017-10-08 NOTE — Assessment & Plan Note (Signed)
stable overall by history and exam, recent data reviewed with pt, and pt to continue medical treatment as before,  to f/u any worsening symptoms or concerns Lab Results  Component Value Date   HGBA1C 6.3 06/09/2017

## 2017-10-08 NOTE — Progress Notes (Signed)
Subjective:    Patient ID: Jacob [name], male    DOB: 1965/08/10, 52 y.o.   MRN: [id number]  HPI  Here for wellness and f/u;  Overall doing ok;  Pt denies Chest pain, worsening SOB, DOE, wheezing, orthopnea, PND, worsening LE edema, palpitations, dizziness or syncope.  Pt denies neurological change such as new headache, facial or extremity weakness.  Pt denies polydipsia, polyuria, or low sugar symptoms. Pt states overall good compliance with treatment and medications, good tolerability, and has been trying to follow appropriate diet.  Pt denies worsening depressive symptoms, suicidal ideation or panic. No fever, night sweats, wt loss, loss of appetite, or other constitutional symptoms.  Pt states good ability with ADL's, has low fall risk, home safety reviewed and adequate, no other significant changes in hearing or vision, and has been trying to be more active with exercise.  No further migraine since seen at ED recently.   Working 2 jobs. Has right knee, right elbow and left achilles tendonitis like pain.  Has been better with naproxyn.  Right hip has been chronic pain with reduced ROM, worse recently. Past Medical History:  Diagnosis Date  . Allergy   . Anxiety   . Cholesteatoma of right ear   . 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 Enlarged prostate   . GERD (gastroesophageal reflux disease)   . Hernia of abdominal wall   . Hyperlipidemia 10/18/2014  . Hypertension    Past Surgical History:  Procedure Laterality Date  . CHOLECYSTECTOMY    . [place]  . INNER EAR SURGERY     right  . LUMBAR DISC SURGERY    . tubes in bil ears      reports that he has never smoked. He has never used smokeless tobacco. He reports that he drinks about 3.0 oz of alcohol per week. He reports that he does not use drugs. family history includes COPD in his mother; Diabetes in his maternal grandmother and paternal grandmother; Heart disease in his father; Hypertension in his  mother; Lung cancer in his maternal uncle. No Known Allergies Current Outpatient Medications on File Prior to Visit  Medication Sig Dispense Refill  . ALPRAZolam (XANAX) 0.5 MG tablet TAKE 1 TO 2 TABLETS BY MOUTH AT BEDTIME AS NEEDED 60 tablet 5  . aspirin EC 81 MG tablet Take 1 tablet (81 mg total) by mouth daily. 90 tablet 11  . metFORMIN (GLUCOPHAGE-XR) 500 MG 24 hr tablet Take 3 tablets (1,500 mg total) by mouth daily with breakfast. 270 tablet 3  . Multiple Vitamin (MULTIVITAMIN WITH MINERALS) TABS tablet Take 1 tablet by mouth daily.    . Omega-3 Fatty Acids (FISH OIL PO) Take 1 capsule by mouth daily.    . pantoprazole (PROTONIX) 40 MG tablet Take 1 tablet (40 mg total) by mouth daily. 90 tablet 3  . ramipril (ALTACE) 10 MG capsule Take 1 capsule (10 mg total) by mouth daily. 90 capsule 3  . naproxen (NAPROSYN) 500 MG tablet Take 500 mg by mouth 2 (two) times daily with a meal.  1   No current facility-administered medications on file prior to visit.    Review of Systems Constitutional: Negative for other unusual diaphoresis, sweats, appetite or weight changes HENT: Negative for other worsening hearing loss, ear pain, facial swelling, mouth sores or neck stiffness.   Eyes: Negative for other worsening pain, redness or other visual disturbance.  Respiratory: Negative for other stridor or swelling Cardiovascular: Negative  for other palpitations or other chest pain  Gastrointestinal: Negative for worsening diarrhea or loose stools, blood in stool, distention or other pain Genitourinary: Negative for hematuria, flank pain or other change in urine volume.  Musculoskeletal: Negative for myalgias or other joint swelling.  Skin: Negative for other color change, or other wound or worsening drainage.  Neurological: Negative for other syncope or numbness. Hematological: Negative for other adenopathy or swelling Psychiatric/Behavioral: Negative for hallucinations, other worsening agitation, SI,  self-injury, or new decreased concentration All other system neg per pt    Objective:   Physical Exam BP 110/64 (BP Location: Left Arm, Patient Position: Sitting, Cuff Size: Normal)   Pulse 66   Ht 6' (1.829 m)   Wt 174 lb (78.9 kg)   SpO2 97%   BMI 23.60 kg/m  VS noted,  Constitutional: Pt is oriented to person, place, and time. Appears well-developed and well-nourished, in no significant distress and comfortable Head: Normocephalic and atraumatic  Eyes: Conjunctivae and EOM are normal. Pupils are equal, round, and reactive to light Right Ear: External ear normal without discharge Left Ear: External ear normal without discharge Nose: Nose without discharge or deformity Mouth/Throat: Oropharynx is without other ulcerations and moist  Neck: Normal range of motion. Neck supple. No JVD present. No tracheal deviation present or significant neck LA or mass Cardiovascular: Normal rate, regular rhythm, normal heart sounds and intact distal pulses.   Pulmonary/Chest: WOB normal and breath sounds without rales or wheezing  Abdominal: Soft. Bowel sounds are normal. NT. No HSM  Musculoskeletal: Normal range of motion. Exhibits no edema except for right hip reducd ROm with pain to int and ext rotation Lymphadenopathy: Has no other cervical adenopathy.  Neurological: Pt is alert and oriented to person, place, and time. Pt has normal reflexes. No cranial nerve deficit. Motor grossly intact, Gait intact Skin: Skin is warm and dry. No rash noted or new ulcerations Psychiatric:  Has normal mood and affect. Behavior is normal without agitation, 2+ nervous Lab Results  Component Value Date   WBC 4.4 09/17/2017   HGB 16.1 09/17/2017   HCT 45.2 09/17/2017   PLT 205 09/17/2017   GLUCOSE 129 (H) 09/17/2017   CHOL 194 11/23/2016   TRIG 262.0 (H) 11/23/2016   HDL 39.10 11/23/2016   LDLDIRECT 102.0 11/23/2016   LDLCALC 122 (H) 06/04/2016   ALT 42 11/23/2016   AST 28 11/23/2016   NA 140 09/17/2017    K 3.9 09/17/2017   CL 105 09/17/2017   CREATININE 1.24 09/17/2017   BUN 22 (H) 09/17/2017   CO2 26 09/17/2017   TSH 2.60 11/23/2016   PSA 1.16 11/23/2016   INR 1.0 01/18/2007   HGBA1C 6.3 06/09/2017   MICROALBUR 1.4 11/23/2016          Assessment & Plan:

## 2017-10-08 NOTE — Patient Instructions (Addendum)
Please make appt with Dr Tamala Julian of Sports Medicine in this office for the right hip pain  Please take all new medication as prescribed - the nasacort for allergies  You will be contacted regarding the referral for: colonoscopy  Please continue all other medications as before, and refills have been done if requested.  Please have the pharmacy call with any other refills you may need.  Please continue your efforts at being more active, low cholesterol diet, and weight control.  You are otherwise up to date with prevention measures today.  Please keep your appointments with your specialists as you may have planned  Please go to the LAB in the Basement (turn left off the elevator) for the tests to be done today  You will be contacted by phone if any changes need to be made immediately.  Otherwise, you will receive a letter about your results with an explanation, but please check with MyChart first.  Please remember to sign up for MyChart if you have not done so, as this will be important to you in the future with finding out test results, communicating by private email, and scheduling acute appointments online when needed.  Please return in 6 months, or sooner if needed, with Lab testing done 3-5 days before

## 2017-10-09 LAB — HIV ANTIBODY (ROUTINE TESTING W REFLEX): HIV: NONREACTIVE

## 2017-10-11 ENCOUNTER — Telehealth: Payer: Self-pay

## 2017-10-11 NOTE — Telephone Encounter (Signed)
Pt has viewed results via MyChart  

## 2017-10-11 NOTE — Telephone Encounter (Signed)
-----   Message from Biagio Borg, MD sent at 10/08/2017  5:46 PM EDT ----- Left message on MyChart, pt to cont same tx except  The test results show that your current treatment is OK, except the LDL cholesterol is mildly high.  We should add a low dose medication called crestor 10 mg per day.  I will send the prescription, and you should hear from the office as well.Redmond Baseman to please inform pt, I will do rx

## 2017-10-16 ENCOUNTER — Encounter (HOSPITAL_COMMUNITY): Payer: Self-pay | Admitting: Emergency Medicine

## 2017-10-16 ENCOUNTER — Other Ambulatory Visit: Payer: Self-pay

## 2017-10-16 ENCOUNTER — Emergency Department (HOSPITAL_COMMUNITY)
Admission: EM | Admit: 2017-10-16 | Discharge: 2017-10-16 | Disposition: A | Discharge status: Home / Self Care | Payer: 59 | Attending: Emergency Medicine | Admitting: Emergency Medicine

## 2017-10-16 DIAGNOSIS — R002 Palpitations: Secondary | ICD-10-CM

## 2017-10-16 DIAGNOSIS — E119 Type 2 diabetes mellitus without complications: Secondary | ICD-10-CM | POA: Insufficient documentation

## 2017-10-16 DIAGNOSIS — Z7984 Long term (current) use of oral hypoglycemic drugs: Secondary | ICD-10-CM | POA: Insufficient documentation

## 2017-10-16 DIAGNOSIS — Z7982 Long term (current) use of aspirin: Secondary | ICD-10-CM | POA: Diagnosis not present

## 2017-10-16 DIAGNOSIS — R000 Tachycardia, unspecified: Secondary | ICD-10-CM | POA: Diagnosis present

## 2017-10-16 DIAGNOSIS — F419 Anxiety disorder, unspecified: Secondary | ICD-10-CM | POA: Diagnosis not present

## 2017-10-16 DIAGNOSIS — I10 Essential (primary) hypertension: Secondary | ICD-10-CM | POA: Diagnosis not present

## 2017-10-16 NOTE — ED Notes (Signed)
Pt was at desk requesting d/c papers.

## 2017-10-16 NOTE — ED Provider Notes (Signed)
[facility] EMERGENCY DEPARTMENT Provider Note   CSN: [id number] Arrival date & time: 10/16/17  1107     History   Chief Complaint Chief Complaint  Patient presents with  . Tachycardia    HPI Jacob [name] is a 52 y.o. male.  HPI 52 year old male comes in with chief complaint of palpitations.  Patient has history of anxiety and diabetes.  He states that an hour after taking Viagra, he was on his way to see a friend and had intercourse with her.  Patient started having second thoughts about visiting the friend, and started noticing palpitations.  Patient has history of anxiety, and he believes that his anxiety took over at that point and his symptoms started getting worse.  At no point did patient has chest pain or near syncope, but he did feel short of breath.  Prior to getting into the ER patient took Xanax, and now he feels a lot better and wants to go home.  Patient has no history of CAD and has had cath, echo and stress test in the past which were all normal.  Patient denies any substance abuse, heavy smoking, he works out every day and has diabetes that is well controlled.  Past Medical History:  Diagnosis Date  . Allergy   . Anxiety   . Cholesteatoma of right ear   . Diabetes mellitus ([place])    bordreline  . Enlarged prostate   . GERD (gastroesophageal reflux disease)   . Hernia of abdominal wall   . Hyperlipidemia 10/18/2014  . Hypertension     Patient Active Problem List   Diagnosis Date Noted  . Acute sinus infection 06/11/2017  . Colon cancer screening 04/09/2017  . Nausea without vomiting 04/09/2017  . Dysphagia 04/09/2017  . Dyspepsia 11/19/2016  . Right otitis media 09/09/2016  . Vertigo 09/09/2016  . Hearing loss, right 09/09/2016  . BPH (benign prostatic hyperplasia) 05/30/2016  . Right shoulder pain 05/30/2016  . Acute midline low back pain with sciatica 04/21/2016  . 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 04/21/2016  . Urinary frequency  02/05/2016  . Syncope 09/27/2015  . Left lateral epicondylitis 08/31/2015  . Allergic rhinitis 08/29/2015  . Bilateral hearing loss 06/05/2015  . Hypersomnolence 06/05/2015  . Chest pain 01/23/2015  . Chronic pain syndrome 01/23/2015  . Hyperlipidemia 10/18/2014  . Impingement syndrome of right shoulder 06/28/2014  . Hip flexor tendon tightness 06/28/2014  . Right knee pain 06/28/2014  . Depression with anxiety 01/30/2013  . Diabetes mellitus without complication ([place]) 35/45/6256  . Preventative health care 01/11/2013  . Essential hypertension, benign 01/11/2013  . Low back pain syndrome 01/11/2013  . DJD (degenerative joint disease) 01/11/2013  . Erectile dysfunction 01/11/2013  . GERD (gastroesophageal reflux disease) 12/30/2011    Past Surgical History:  Procedure Laterality Date  . CHOLECYSTECTOMY    . [place]  . INNER EAR SURGERY     right  . LUMBAR DISC SURGERY    . tubes in bil ears          Home Medications    Prior to Admission medications   Medication Sig Start Date End Date Taking? Authorizing Provider  ALPRAZolam Duanne Moron) 0.5 MG tablet TAKE 1 TO 2 TABLETS BY MOUTH AT BEDTIME AS NEEDED 08/26/17   Biagio Borg, MD  aspirin EC 81 MG tablet Take 1 tablet (81 mg total) by mouth daily. 05/09/14   Biagio Borg, MD  metFORMIN (GLUCOPHAGE-XR) 500 MG 24 hr  tablet Take 3 tablets (1,500 mg total) by mouth daily with breakfast. 06/09/17   Biagio Borg, MD  Multiple Vitamin (MULTIVITAMIN WITH MINERALS) TABS tablet Take 1 tablet by mouth daily.    [provider]  naproxen (NAPROSYN) 500 MG tablet Take 500 mg by mouth 2 (two) times daily with a meal. 09/18/17   [provider]  Omega-3 Fatty Acids (FISH OIL PO) Take 1 capsule by mouth daily.    [provider]  pantoprazole (PROTONIX) 40 MG tablet Take 1 tablet (40 mg total) by mouth daily. 06/09/17   Biagio Borg, MD  ramipril (ALTACE) 10 MG capsule Take 1 capsule (10 mg  total) by mouth daily. 06/09/17   Biagio Borg, MD  rosuvastatin (CRESTOR) 10 MG tablet Take 1 tablet (10 mg total) by mouth daily. 10/08/17   Biagio Borg, MD  tadalafil (CIALIS) 20 MG tablet Take 1 tablet (20 mg total) by mouth daily as needed for erectile dysfunction. 10/08/17 11/07/17  Biagio Borg, MD  triamcinolone (NASACORT) 55 MCG/ACT AERO nasal inhaler Place 2 sprays into the nose daily. 10/08/17   Biagio Borg, MD    Family History Family History  Problem Relation Age of Onset  . COPD Mother   . Hypertension Mother   . Heart disease Father   . Diabetes Paternal Grandmother   . Diabetes Maternal Grandmother   . Lung cancer Maternal Uncle   . Colon cancer Neg Hx   . Esophageal cancer Neg Hx   . Rectal cancer Neg Hx   . Stomach cancer Neg Hx     Social History Social History   Tobacco Use  . Smoking status: Never Smoker  . Smokeless tobacco: Never Used  Substance Use Topics  . Alcohol use: Yes    Alcohol/week: 3.0 oz    Types: 5 Glasses of wine per week    Comment: social  . Drug use: No     Allergies   Patient has no known allergies.   Review of Systems Review of Systems  Constitutional: Positive for activity change.  Respiratory: Positive for shortness of breath.   Cardiovascular: Positive for palpitations. Negative for chest pain.  Psychiatric/Behavioral: The patient is nervous/anxious.      Physical Exam Updated Vital Signs BP 139/78   Pulse 93   Temp 98.1 F (36.7 C)   Resp 17   Ht 6' (1.829 m)   Wt 77.1 kg (170 lb)   SpO2 100%   BMI 23.06 kg/m   Physical Exam  Constitutional: He is oriented to person, place, and time. He appears well-developed.  HENT:  Head: Atraumatic.  Neck: Neck supple.  Cardiovascular: Normal rate.  Pulmonary/Chest: Effort normal.  Neurological: He is alert and oriented to person, place, and time.  Skin: Skin is warm.  Psychiatric: His behavior is normal. Thought content normal.  Nursing note and vitals  reviewed.    ED Treatments / Results  Labs (all labs ordered are listed, but only abnormal results are displayed) Labs Reviewed - No data to display  EKG EKG Interpretation  Date/Time:  Saturday October 16 2017 11:16:26 EDT Ventricular Rate:  94 PR Interval:    QRS Duration: 84 QT Interval:  344 QTC Calculation: 431 R Axis:   66 Text Interpretation:  Sinus rhythm no acute st/ts similar to prior 3/19 No significant change since last tracing Reconfirmed by Varney Biles [phone]) on 10/16/2017 12:19:38 PM   Radiology No results found.  Procedures Procedures (including critical care  time)  Medications Ordered in ED Medications - No data to display   Initial Impression / Assessment and Plan / ED Course  I have reviewed the triage vital signs and the nursing notes.  Pertinent labs & imaging results that were available during my care of the patient were reviewed by me and considered in my medical decision making (see chart for details).     51 year old male comes in with chief complaint of palpitations.  It appears that patient had an anxiety attack.  Patient had taken Viagra and was going to see a friend, when he started having second thoughts about visiting their friend -and that is when he started having palpitations.  Patient took Xanax prior to ED arrival and is now asymptomatic.  He denies any chest pain or diaphoresis at any point.  Patient is relatively healthy and exercises regularly, he does not have any smoking history or drug use history.  Additionally, patient has no known cardiac disease, and has had normal stress test in the past.  Patient is stable for discharge.  Final Clinical Impressions(s) / ED Diagnoses   Final diagnoses:  Palpitations  Anxiety    ED Discharge Orders    None       Varney Biles, MD 10/16/17 1245

## 2017-10-16 NOTE — ED Triage Notes (Addendum)
Pt took Viagra about an hour ago.  Pt states he was driving and "started to have palpations and my heart felt racey" pt took 0.25 mg of xanax before triage to "calm his nerves" denies any pain at this time.

## 2017-10-16 NOTE — ED Notes (Signed)
Pt denies symptoms currently

## 2017-10-16 NOTE — Discharge Instructions (Addendum)
We saw in the ER for palpitations. There does appear to be an element of anxiety with your symptoms.  We wanted to watch you in the emergency room for a little bit longer time, however you feel comfortable going home.  Please see a primary care doctor if you continue to have symptoms of palpitations. Return to the ER if you start having severe chest pain, fainting spells, or severe shortness of breath.

## 2017-11-01 ENCOUNTER — Other Ambulatory Visit: Payer: Self-pay | Admitting: Internal Medicine

## 2017-11-10 ENCOUNTER — Ambulatory Visit: Payer: 59 | Admitting: Internal Medicine

## 2017-11-10 ENCOUNTER — Encounter: Payer: Self-pay | Admitting: Internal Medicine

## 2017-11-10 VITALS — BP 118/76 | HR 73 | Temp 98.6°F | Ht 72.0 in | Wt 179.0 lb

## 2017-11-10 DIAGNOSIS — H6690 Otitis media, unspecified, unspecified ear: Secondary | ICD-10-CM

## 2017-11-10 DIAGNOSIS — H6693 Otitis media, unspecified, bilateral: Secondary | ICD-10-CM | POA: Diagnosis not present

## 2017-11-10 DIAGNOSIS — I10 Essential (primary) hypertension: Secondary | ICD-10-CM

## 2017-11-10 DIAGNOSIS — F418 Other specified anxiety disorders: Secondary | ICD-10-CM | POA: Diagnosis not present

## 2017-11-10 HISTORY — DX: Otitis media, unspecified, unspecified ear: H66.90

## 2017-11-10 MED ORDER — AZITHROMYCIN 250 MG PO TABS: ORAL_TABLET | ORAL | 1 refills | Status: DC

## 2017-11-10 NOTE — Patient Instructions (Signed)
Please take all new medication as prescribed - the antibiotic  You can also take Delsym OTC for cough, and/or Mucinex (or it's generic off brand) for congestion, and tylenol as needed for pain.  Please continue all other medications as before, and refills have been done if requested.  Please have the pharmacy call with any other refills you may need.  Please keep your appointments with your specialists as you may have planned    

## 2017-11-10 NOTE — Assessment & Plan Note (Signed)
stable overall by history and exam, recent data reviewed with pt, and pt to continue medical treatment as before,  to f/u any worsening symptoms or concerns  

## 2017-11-10 NOTE — Assessment & Plan Note (Signed)
Mild to mod, for antibx course,  to f/u any worsening symptoms or concerns 

## 2017-11-10 NOTE — Progress Notes (Signed)
Subjective:    Patient ID: Jacob [name], male    DOB: 1965-11-11, 52 y.o.   MRN: [id number]  HPI   Here with 2-3 days acute onset fever, marked bilar ear pain, pressure, headache, general weakness and malaise, with mild ST and cough, but pt denies chest pain, wheezing, increased sob or doe, orthopnea, PND, increased LE swelling, palpitations, dizziness or syncope.   Denies worsening depressive symptoms, suicidal ideation, or panic; has ongoing anxiety, still with having panic attacks at night sometimes.   Pt denies polydipsia, polyuria. Past Medical History:  Diagnosis Date  . Allergy   . Anxiety   . Cholesteatoma of right ear   . Diabetes mellitus ([place])    bordreline  . Enlarged prostate   . GERD (gastroesophageal reflux disease)   . Hernia of abdominal wall   . Hyperlipidemia 10/18/2014  . Hypertension    Past Surgical History:  Procedure Laterality Date  . CHOLECYSTECTOMY    . [place]  . INNER EAR SURGERY     right  . LUMBAR DISC SURGERY    . tubes in bil ears      reports that he has never smoked. He has never used smokeless tobacco. He reports that he drinks about 5.0 standard drinks of alcohol per week. He reports that he does not use drugs. family history includes COPD in his mother; Diabetes in his maternal grandmother and paternal grandmother; Heart disease in his father; Hypertension in his mother; Lung cancer in his maternal uncle. No Known Allergies Current Outpatient Medications on File Prior to Visit  Medication Sig Dispense Refill  . ALPRAZolam (XANAX) 0.5 MG tablet TAKE 1 TO 2 TABLETS BY MOUTH AT BEDTIME AS NEEDED 60 tablet 5  . aspirin EC 81 MG tablet Take 1 tablet (81 mg total) by mouth daily. 90 tablet 11  . metFORMIN (GLUCOPHAGE-XR) 500 MG 24 hr tablet Take 3 tablets (1,500 mg total) by mouth daily with breakfast. 270 tablet 3  . Multiple Vitamin (MULTIVITAMIN WITH MINERALS) TABS tablet Take 1 tablet by mouth daily.    . naproxen  (NAPROSYN) 500 MG tablet Take 500 mg by mouth 2 (two) times daily with a meal.  1  . Omega-3 Fatty Acids (FISH OIL PO) Take 1 capsule by mouth daily.    . pantoprazole (PROTONIX) 40 MG tablet Take 1 tablet (40 mg total) by mouth daily. 90 tablet 3  . pantoprazole (PROTONIX) 40 MG tablet TAKE 1 TABLET BY MOUTH EVERY DAY 90 tablet 3  . ramipril (ALTACE) 10 MG capsule Take 1 capsule (10 mg total) by mouth daily. 90 capsule 3  . rosuvastatin (CRESTOR) 10 MG tablet Take 1 tablet (10 mg total) by mouth daily. 90 tablet 3  . triamcinolone (NASACORT) 55 MCG/ACT AERO nasal inhaler Place 2 sprays into the nose daily. 1 Inhaler 12  . tadalafil (CIALIS) 20 MG tablet Take 1 tablet (20 mg total) by mouth daily as needed for erectile dysfunction. 10 tablet 11   No current facility-administered medications on file prior to visit.    Review of Systems  Constitutional: Negative for other unusual diaphoresis or sweats HENT: Negative for ear discharge or swelling Eyes: Negative for other worsening visual disturbances Respiratory: Negative for stridor or other swelling  Gastrointestinal: Negative for worsening distension or other blood Genitourinary: Negative for retention or other urinary change Musculoskeletal: Negative for other MSK pain or swelling Skin: Negative for color change or other new lesions Neurological: Negative for worsening tremors  and other numbness  Psychiatric/Behavioral: Negative for worsening agitation or other fatigue All other system neg per pt    Objective:   Physical Exam BP 118/76   Pulse 73   Temp 98.6 F (37 C) (Oral)   Ht 6' (1.829 m)   Wt 179 lb (81.2 kg)   SpO2 98%   BMI 24.28 kg/m  VS noted, mild ill Constitutional: Pt appears in NAD HENT: Head: NCAT.  Right Ear: External ear normal.  Left Ear: External ear normal.  Bilat tm's with severe erythema.  Max sinus areas mild tender.  Pharynx with mild erythema, no exudate Eyes: . Pupils are equal, round, and reactive  to light. Conjunctivae and EOM are normal Nose: without d/c or deformity Neck: Neck supple. Gross normal ROM Cardiovascular: Normal rate and regular rhythm.   Pulmonary/Chest: Effort normal and breath sounds without rales or wheezing.  Neurological: Pt is alert. At baseline orientation, motor grossly intact Skin: Skin is warm. No rashes, other new lesions, no LE edema Psychiatric: Pt behavior is normal without agitation  No other exam findings Lab Results  Component Value Date   WBC 5.6 10/08/2017   HGB 15.8 10/08/2017   HCT 45.5 10/08/2017   PLT 219.0 10/08/2017   GLUCOSE 96 10/08/2017   CHOL 177 10/08/2017   TRIG 57.0 10/08/2017   HDL 57.60 10/08/2017   LDLDIRECT 102.0 11/23/2016   LDLCALC 108 (H) 10/08/2017   ALT 22 10/08/2017   AST 21 10/08/2017   NA 140 10/08/2017   K 4.8 10/08/2017   CL 103 10/08/2017   CREATININE 1.16 10/08/2017   BUN 14 10/08/2017   CO2 29 10/08/2017   TSH 1.19 10/08/2017   PSA 1.05 10/08/2017   INR 1.0 01/18/2007   HGBA1C 6.4 10/08/2017   MICROALBUR <0.7 10/08/2017          Assessment & Plan:

## 2017-11-26 ENCOUNTER — Encounter: Payer: Self-pay | Admitting: Internal Medicine

## 2017-11-26 ENCOUNTER — Ambulatory Visit: Payer: 59 | Admitting: Internal Medicine

## 2017-11-26 VITALS — BP 114/76 | HR 74 | Temp 98.3°F | Ht 72.0 in | Wt 173.0 lb

## 2017-11-26 DIAGNOSIS — F418 Other specified anxiety disorders: Secondary | ICD-10-CM | POA: Diagnosis not present

## 2017-11-26 DIAGNOSIS — J309 Allergic rhinitis, unspecified: Secondary | ICD-10-CM | POA: Diagnosis not present

## 2017-11-26 DIAGNOSIS — E785 Hyperlipidemia, unspecified: Secondary | ICD-10-CM

## 2017-11-26 DIAGNOSIS — E119 Type 2 diabetes mellitus without complications: Secondary | ICD-10-CM | POA: Diagnosis not present

## 2017-11-26 DIAGNOSIS — Z23 Encounter for immunization: Secondary | ICD-10-CM | POA: Diagnosis not present

## 2017-11-26 MED ORDER — ROSUVASTATIN CALCIUM 10 MG PO TABS: 10.0000 mg | ORAL_TABLET | Freq: Every day | ORAL | 3 refills | Status: DC

## 2017-11-26 MED ORDER — CITALOPRAM HYDROBROMIDE 20 MG PO TABS: 20.0000 mg | ORAL_TABLET | Freq: Every day | ORAL | 3 refills | Status: DC

## 2017-11-26 NOTE — Patient Instructions (Signed)
.  Please take all new medication as prescribed - the celexa 20 mg per day  Please continue all other medications as before, and refills have been done if requested.  Please have the pharmacy call with any other refills you may need.  Please keep your appointments with your specialists as you may have planned

## 2017-11-26 NOTE — Progress Notes (Signed)
Subjective:    Patient ID: Jacob [name], male    DOB: [birth year], 52 y.o.   MRN: [id number]  HPI  Here to f/u; overall doing ok,  Pt denies chest pain, increasing sob or doe, wheezing, orthopnea, PND, increased LE swelling, palpitations, dizziness or syncope.  Pt denies new neurological symptoms such as new headache, or facial or extremity weakness or numbness.  Pt denies polydipsia, polyuria, or low sugar episode.  Pt states overall good compliance with meds, mostly trying to follow appropriate diet, with wt overall stable.  Does have several wks ongoing nasal allergy symptoms with clearish congestion, itch and sneezing, without fever, pain, ST, cough, swelling or wheezing.  Also with increased anxiety and several panic attacks in the past week, with multiple ongoing social stressors, has worsening sleep as well but does not want to change the xanax.  Starting new job next wk and will not have insurance for 90 days.  Has not yet started crestor but will now Past Medical History:  Diagnosis Date  . Allergy   . Anxiety   . Cholesteatoma of right ear   . Diabetes mellitus ([place])    bordreline  . Enlarged prostate   . GERD (gastroesophageal reflux disease)   . Hernia of abdominal wall   . Hyperlipidemia 10/18/2014  . Hypertension    Past Surgical History:  Procedure Laterality Date  . CHOLECYSTECTOMY    . [place]  . INNER EAR SURGERY     right  . LUMBAR DISC SURGERY    . tubes in bil ears      reports that he has never smoked. He has never used smokeless tobacco. He reports that he drinks about 5.0 standard drinks of alcohol per week. He reports that he does not use drugs. family history includes COPD in his mother; Diabetes in his maternal grandmother and paternal grandmother; Heart disease in his father; Hypertension in his mother; Lung cancer in his maternal uncle. No Known Allergies Current Outpatient Medications on File Prior to Visit  Medication Sig  Dispense Refill  . ALPRAZolam (XANAX) 0.5 MG tablet TAKE 1 TO 2 TABLETS BY MOUTH AT BEDTIME AS NEEDED 60 tablet 5  . aspirin EC 81 MG tablet Take 1 tablet (81 mg total) by mouth daily. 90 tablet 11  . azithromycin (ZITHROMAX) 250 MG tablet 2 tab by mouth day1 , then 1 per day 6 tablet 1  . metFORMIN (GLUCOPHAGE-XR) 500 MG 24 hr tablet Take 3 tablets (1,500 mg total) by mouth daily with breakfast. 270 tablet 3  . Multiple Vitamin (MULTIVITAMIN WITH MINERALS) TABS tablet Take 1 tablet by mouth daily.    . naproxen (NAPROSYN) 500 MG tablet Take 500 mg by mouth 2 (two) times daily with a meal.  1  . Omega-3 Fatty Acids (FISH OIL PO) Take 1 capsule by mouth daily.    . pantoprazole (PROTONIX) 40 MG tablet Take 1 tablet (40 mg total) by mouth daily. 90 tablet 3  . pantoprazole (PROTONIX) 40 MG tablet TAKE 1 TABLET BY MOUTH EVERY DAY 90 tablet 3  . ramipril (ALTACE) 10 MG capsule Take 1 capsule (10 mg total) by mouth daily. 90 capsule 3  . triamcinolone (NASACORT) 55 MCG/ACT AERO nasal inhaler Place 2 sprays into the nose daily. 1 Inhaler 12  . tadalafil (CIALIS) 20 MG tablet Take 1 tablet (20 mg total) by mouth daily as needed for erectile dysfunction. 10 tablet 11   No current facility-administered medications on file  prior to visit.    Review of Systems  Constitutional: Negative for other unusual diaphoresis or sweats HENT: Negative for ear discharge or swelling Eyes: Negative for other worsening visual disturbances Respiratory: Negative for stridor or other swelling  Gastrointestinal: Negative for worsening distension or other blood Genitourinary: Negative for retention or other urinary change Musculoskeletal: Negative for other MSK pain or swelling Skin: Negative for color change or other new lesions Neurological: Negative for worsening tremors and other numbness  Psychiatric/Behavioral: Negative for worsening agitation or other fatigue All other system neg per pt    Objective:   Physical  Exam BP 114/76   Pulse 74   Temp 98.3 F (36.8 C) (Oral)   Ht 6' (1.829 m)   Wt 173 lb (78.5 kg)   SpO2 99%   BMI 23.46 kg/m  VS noted,  Constitutional: Pt appears in NAD HENT: Head: NCAT.  Right Ear: External ear normal.  Left Ear: External ear normal.  Bilat tm's with mild erythema.  Max sinus areas non tender.  Pharynx with mild erythema, no exudate Eyes: . Pupils are equal, round, and reactive to light. Conjunctivae and EOM are normal Nose: without d/c or deformity Neck: Neck supple. Gross normal ROM Cardiovascular: Normal rate and regular rhythm.   Pulmonary/Chest: Effort normal and breath sounds without rales or wheezing.  Neurological: Pt is alert. At baseline orientation, motor grossly intact Skin: Skin is warm. No rashes, other new lesions, no LE edema Psychiatric: Pt behavior is normal without agitation , 2+ nervous No other exam findings    Assessment & Plan:

## 2017-11-27 NOTE — Assessment & Plan Note (Signed)
Ok to start crestor

## 2017-11-27 NOTE — Assessment & Plan Note (Signed)
No HI or SI, for celexa 20qd

## 2017-11-27 NOTE — Assessment & Plan Note (Signed)
To start flonase asd,  to f/u any worsening symptoms or concerns

## 2017-11-27 NOTE — Assessment & Plan Note (Signed)
Lab Results  Component Value Date   HGBA1C 6.4 10/08/2017  stable overall by history and exam, recent data reviewed with pt, and pt to continue medical treatment as before,  to f/u any worsening symptoms or concerns

## 2017-12-10 ENCOUNTER — Ambulatory Visit: Payer: Self-pay | Admitting: *Deleted

## 2017-12-10 NOTE — Telephone Encounter (Signed)
Pt calling with complaints of numbness/tingling sensation to the left side of face down to the left shoulder. Pt states he does not feel that the sensation goes all the way down his arm. Pt states that the sensation occurred twice today and happened a couple of times in the past. Pt states he has experienced dizziness and headaches previously but denies at this time. Pt states a couple of days ago he became disoriented for a sec but thinks it may be due to his anxiety. Pt advised to seek care in ED for current symptoms. Pt verbalized understanding and states he is currently at work and is going to try to stay until one before going to the ED. Pt states he drove hisself to work but will see a coworker will take him to [facility] in [place] which is close to him at this time due to work location.   Reason for Disposition . [1] Numbness (i.e., loss of sensation) of the face, arm / hand, or leg / foot on one side of the body AND [2] sudden onset AND [3] brief (now gone)  Answer Assessment - Initial Assessment Questions 1. SYMPTOM: "What is the main symptom you are concerned about?" (e.g., weakness, numbness)     Numbness and tingling on the left side of face and in left shoulder, left side of lip is  quivering 2. ONSET: "When did this start?" (minutes, hours, days; while sleeping)     Pt states that it happened twice today a couple of times in the past 3. LAST NORMAL: "When was the last time you were normal (no symptoms)?"     Earlier today 4. PATTERN "Does this come and go, or has it been constant since it started?"  "Is it present now?"     Comes and goes, tingling/numbness in lip present now 5. CARDIAC SYMPTOMS: "Have you had any of the following symptoms: chest pain, difficulty breathing, palpitations?"     No 6. NEUROLOGIC SYMPTOMS: "Have you had any of the following symptoms: headache, dizziness, vision loss, double vision, changes in speech, unsteady on your feet?"     Dizziness but not right  now, pt also states he has been experiencing headaches but does not have one currenlty 7. OTHER SYMPTOMS: "Do you have any other symptoms?"     No 8. PREGNANCY: "Is there any chance you are pregnant?" "When was your last menstrual period?"     n/a  Protocols used: NEUROLOGIC DEFICIT-A-AH

## 2017-12-11 ENCOUNTER — Encounter (HOSPITAL_COMMUNITY): Payer: Self-pay | Admitting: Emergency Medicine

## 2017-12-11 ENCOUNTER — Emergency Department (HOSPITAL_COMMUNITY)
Admission: EM | Admit: 2017-12-11 | Discharge: 2017-12-11 | Disposition: A | Discharge status: Home / Self Care | Payer: 59 | Attending: Emergency Medicine | Admitting: Emergency Medicine

## 2017-12-11 ENCOUNTER — Emergency Department (HOSPITAL_COMMUNITY): Payer: 59

## 2017-12-11 DIAGNOSIS — Z7982 Long term (current) use of aspirin: Secondary | ICD-10-CM | POA: Insufficient documentation

## 2017-12-11 DIAGNOSIS — R531 Weakness: Secondary | ICD-10-CM | POA: Insufficient documentation

## 2017-12-11 DIAGNOSIS — R51 Headache: Secondary | ICD-10-CM | POA: Diagnosis present

## 2017-12-11 DIAGNOSIS — F419 Anxiety disorder, unspecified: Secondary | ICD-10-CM | POA: Diagnosis not present

## 2017-12-11 DIAGNOSIS — Z7984 Long term (current) use of oral hypoglycemic drugs: Secondary | ICD-10-CM | POA: Diagnosis not present

## 2017-12-11 DIAGNOSIS — Z79899 Other long term (current) drug therapy: Secondary | ICD-10-CM | POA: Insufficient documentation

## 2017-12-11 DIAGNOSIS — E119 Type 2 diabetes mellitus without complications: Secondary | ICD-10-CM | POA: Insufficient documentation

## 2017-12-11 DIAGNOSIS — R42 Dizziness and giddiness: Secondary | ICD-10-CM | POA: Insufficient documentation

## 2017-12-11 DIAGNOSIS — G44209 Tension-type headache, unspecified, not intractable: Secondary | ICD-10-CM

## 2017-12-11 DIAGNOSIS — I10 Essential (primary) hypertension: Secondary | ICD-10-CM | POA: Insufficient documentation

## 2017-12-11 LAB — DIFFERENTIAL
Abs Immature Granulocytes: 0 10*3/uL (ref 0.0–0.1)
BASOS ABS: 0 10*3/uL (ref 0.0–0.1)
Basophils Relative: 1 %
EOS PCT: 2 %
Eosinophils Absolute: 0.1 10*3/uL (ref 0.0–0.7)
Immature Granulocytes: 0 %
LYMPHS PCT: 27 %
Lymphs Abs: 1.1 10*3/uL (ref 0.7–4.0)
MONO ABS: 0.3 10*3/uL (ref 0.1–1.0)
Monocytes Relative: 7 %
NEUTROS PCT: 63 %
Neutro Abs: 2.5 10*3/uL (ref 1.7–7.7)

## 2017-12-11 LAB — COMPREHENSIVE METABOLIC PANEL
ALK PHOS: 60 U/L (ref 38–126)
ALT: 23 U/L (ref 0–44)
AST: 20 U/L (ref 15–41)
Albumin: 4 g/dL (ref 3.5–5.0)
Anion gap: 9 (ref 5–15)
BUN: 10 mg/dL (ref 6–20)
CHLORIDE: 108 mmol/L (ref 98–111)
CO2: 24 mmol/L (ref 22–32)
CREATININE: 1.17 mg/dL (ref 0.61–1.24)
Calcium: 9.2 mg/dL (ref 8.9–10.3)
Glucose, Bld: 173 mg/dL — ABNORMAL HIGH (ref 70–99)
POTASSIUM: 4.4 mmol/L (ref 3.5–5.1)
SODIUM: 141 mmol/L (ref 135–145)
Total Bilirubin: 1 mg/dL (ref 0.3–1.2)
Total Protein: 7.1 g/dL (ref 6.5–8.1)

## 2017-12-11 LAB — I-STAT CHEM 8, ED
BUN: 13 mg/dL (ref 6–20)
CREATININE: 1.1 mg/dL (ref 0.61–1.24)
Calcium, Ion: 1.07 mmol/L — ABNORMAL LOW (ref 1.15–1.40)
Chloride: 108 mmol/L (ref 98–111)
Glucose, Bld: 168 mg/dL — ABNORMAL HIGH (ref 70–99)
HEMATOCRIT: 49 % (ref 39.0–52.0)
HEMOGLOBIN: 16.7 g/dL (ref 13.0–17.0)
Potassium: 4.5 mmol/L (ref 3.5–5.1)
Sodium: 142 mmol/L (ref 135–145)
TCO2: 26 mmol/L (ref 22–32)

## 2017-12-11 LAB — CBC
HEMATOCRIT: 50 % (ref 39.0–52.0)
Hemoglobin: 16.6 g/dL (ref 13.0–17.0)
MCH: 29.9 pg (ref 26.0–34.0)
MCHC: 33.2 g/dL (ref 30.0–36.0)
MCV: 89.9 fL (ref 78.0–100.0)
Platelets: 204 10*3/uL (ref 150–400)
RBC: 5.56 MIL/uL (ref 4.22–5.81)
RDW: 12.9 % (ref 11.5–15.5)
WBC: 4 10*3/uL (ref 4.0–10.5)

## 2017-12-11 LAB — CBG MONITORING, ED: Glucose-Capillary: 160 mg/dL — ABNORMAL HIGH (ref 70–99)

## 2017-12-11 LAB — I-STAT TROPONIN, ED: TROPONIN I, POC: 0 ng/mL (ref 0.00–0.08)

## 2017-12-11 LAB — PROTIME-INR
INR: 0.96
Prothrombin Time: 12.6 seconds (ref 11.4–15.2)

## 2017-12-11 LAB — APTT: APTT: 29 s (ref 24–36)

## 2017-12-11 NOTE — ED Provider Notes (Signed)
[facility] DEPART[facility] Provider Note   CSN: [id number] Arrival date & time: 12/11/17  0950     History   Chief Complaint Chief Complaint  Patient presents with  . Code Stroke    HPI Jacob [name] is a 52 y.o. male.  Patient is a 52 year old male who presents as a code stroke.  He has a history of hypertension, hyperlipidemia, borderline diabetes and anxiety.  He is been having headaches since yesterday.  He feels like there is stress type tension headaches.  This morning he felt dizzy and felt like his right arm and leg was weak.  He was evaluated as a code stroke.  On my evaluation he states his symptoms have resolved and he feels back to baseline.  He does say that he has been under a lot of stress recently and he is overly concerned about medical problems such as he is very concerned about obtaining brain eating bacterial diseases from [place] and gets really concerned about any medical issues.  He is requesting information about following up with a psychiatrist for counseling.  He denies any suicidal or homicidal ideations.  No hallucinations.     Past Medical History:  Diagnosis Date  . Allergy   . Anxiety   . Cholesteatoma of right ear   . Diabetes mellitus ([place])    bordreline  . Enlarged prostate   . GERD (gastroesophageal reflux disease)   . Hernia of abdominal wall   . Hyperlipidemia 10/18/2014  . Hypertension     Patient Active Problem List   Diagnosis Date Noted  . Acute sinus infection 06/11/2017  . Colon cancer screening 04/09/2017  . Nausea without vomiting 04/09/2017  . Dysphagia 04/09/2017  . Dyspepsia 11/19/2016  . Right otitis media 09/09/2016  . Vertigo 09/09/2016  . Hearing loss, right 09/09/2016  . BPH (benign prostatic hyperplasia) 05/30/2016  . Right shoulder pain 05/30/2016  . Acute midline low back pain with sciatica 04/21/2016  . 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 04/21/2016  . Urinary  frequency 02/05/2016  . Syncope 09/27/2015  . Left lateral epicondylitis 08/31/2015  . Allergic rhinitis 08/29/2015  . Bilateral hearing loss 06/05/2015  . Hypersomnolence 06/05/2015  . Chest pain 01/23/2015  . Chronic pain syndrome 01/23/2015  . Hyperlipidemia 10/18/2014  . Impingement syndrome of right shoulder 06/28/2014  . Hip flexor tendon tightness 06/28/2014  . Right knee pain 06/28/2014  . Depression with anxiety 01/30/2013  . Diabetes mellitus without complication ([place]) 82/99/3716  . Preventative health care 01/11/2013  . Essential hypertension, benign 01/11/2013  . Low back pain syndrome 01/11/2013  . DJD (degenerative joint disease) 01/11/2013  . Erectile dysfunction 01/11/2013  . GERD (gastroesophageal reflux disease) 12/30/2011    Past Surgical History:  Procedure Laterality Date  . CHOLECYSTECTOMY    . [place]  . INNER EAR SURGERY     right  . LUMBAR DISC SURGERY    . tubes in bil ears          Home Medications    Prior to Admission medications   Medication Sig Start Date End Date Taking? Authorizing Provider  ALPRAZolam Duanne Moron) 0.5 MG tablet TAKE 1 TO 2 TABLETS BY MOUTH AT BEDTIME AS NEEDED 08/26/17   Biagio Borg, MD  aspirin EC 81 MG tablet Take 1 tablet (81 mg total) by mouth daily. 05/09/14   Biagio Borg, MD  azithromycin (ZITHROMAX) 250 MG tablet 2 tab by mouth day1 ,  then 1 per day 11/10/17   Biagio Borg, MD  citalopram (CELEXA) 20 MG tablet Take 1 tablet (20 mg total) by mouth daily. 11/26/17   Biagio Borg, MD  metFORMIN (GLUCOPHAGE-XR) 500 MG 24 hr tablet Take 3 tablets (1,500 mg total) by mouth daily with breakfast. 06/09/17   Biagio Borg, MD  Multiple Vitamin (MULTIVITAMIN WITH MINERALS) TABS tablet Take 1 tablet by mouth daily.    [provider]  naproxen (NAPROSYN) 500 MG tablet Take 500 mg by mouth 2 (two) times daily with a meal. 09/18/17   [provider]  Omega-3 Fatty Acids (FISH OIL PO) Take 1  capsule by mouth daily.    [provider]  pantoprazole (PROTONIX) 40 MG tablet Take 1 tablet (40 mg total) by mouth daily. 06/09/17   Biagio Borg, MD  pantoprazole (PROTONIX) 40 MG tablet TAKE 1 TABLET BY MOUTH EVERY DAY 11/01/17   Biagio Borg, MD  ramipril (ALTACE) 10 MG capsule Take 1 capsule (10 mg total) by mouth daily. 06/09/17   Biagio Borg, MD  rosuvastatin (CRESTOR) 10 MG tablet Take 1 tablet (10 mg total) by mouth daily. 11/26/17   Biagio Borg, MD  tadalafil (CIALIS) 20 MG tablet Take 1 tablet (20 mg total) by mouth daily as needed for erectile dysfunction. 10/08/17 11/07/17  Biagio Borg, MD  triamcinolone (NASACORT) 55 MCG/ACT AERO nasal inhaler Place 2 sprays into the nose daily. 10/08/17   Biagio Borg, MD    Family History Family History  Problem Relation Age of Onset  . COPD Mother   . Hypertension Mother   . Heart disease Father   . Diabetes Paternal Grandmother   . Diabetes Maternal Grandmother   . Lung cancer Maternal Uncle   . Colon cancer Neg Hx   . Esophageal cancer Neg Hx   . Rectal cancer Neg Hx   . Stomach cancer Neg Hx     Social History Social History   Tobacco Use  . Smoking status: Never Smoker  . Smokeless tobacco: Never Used  Substance Use Topics  . Alcohol use: Yes    Alcohol/week: 5.0 standard drinks    Types: 5 Glasses of wine per week    Comment: social  . Drug use: No     Allergies   Patient has no known allergies.   Review of Systems Review of Systems  Constitutional: Negative for chills, diaphoresis, fatigue and fever.  HENT: Negative for congestion, rhinorrhea and sneezing.   Eyes: Negative.   Respiratory: Negative for cough, chest tightness and shortness of breath.   Cardiovascular: Negative for chest pain and leg swelling.  Gastrointestinal: Negative for abdominal pain, blood in stool, diarrhea, nausea and vomiting.  Genitourinary: Negative for difficulty urinating, flank pain, frequency and hematuria.    Musculoskeletal: Negative for arthralgias and back pain.  Skin: Negative for rash.  Neurological: Positive for dizziness, speech difficulty, weakness and headaches. Negative for numbness.     Physical Exam Updated Vital Signs BP 122/85   Pulse 83   Temp 98.6 F (37 C)   Resp 10   Wt 85 kg   SpO2 99%   BMI 25.42 kg/m   Physical Exam  Constitutional: He is oriented to person, place, and time. He appears well-developed and well-nourished.  HENT:  Head: Normocephalic and atraumatic.  Eyes: Pupils are equal, round, and reactive to light.  Neck: Normal range of motion. Neck supple.  Cardiovascular: Normal rate, regular rhythm and normal  heart sounds.  Pulmonary/Chest: Effort normal and breath sounds normal. No respiratory distress. He has no wheezes. He has no rales. He exhibits no tenderness.  Abdominal: Soft. Bowel sounds are normal. There is no tenderness. There is no rebound and no guarding.  Musculoskeletal: Normal range of motion. He exhibits no edema.  Lymphadenopathy:    He has no cervical adenopathy.  Neurological: He is alert and oriented to person, place, and time.  Motor 5/5 all extremities Sensation grossly intact to LT all extremities Finger to Nose intact, no pronator drift CN II-XII grossly intact Gait normal   Skin: Skin is warm and dry. No rash noted.  Psychiatric: He has a normal mood and affect.     ED Treatments / Results  Labs (all labs ordered are listed, but only abnormal results are displayed) Labs Reviewed  COMPREHENSIVE METABOLIC PANEL - Abnormal; Notable for the following components:      Result Value   Glucose, Bld 173 (*)    All other components within normal limits  CBG MONITORING, ED - Abnormal; Notable for the following components:   Glucose-Capillary 160 (*)    All other components within normal limits  I-STAT CHEM 8, ED - Abnormal; Notable for the following components:   Glucose, Bld 168 (*)    Calcium, Ion 1.07 (*)    All other  components within normal limits  PROTIME-INR  APTT  CBC  DIFFERENTIAL  I-STAT TROPONIN, ED    EKG EKG Interpretation  Date/Time:  Saturday December 11 2017 10:07:37 EDT Ventricular Rate:  87 PR Interval:    QRS Duration: 81 QT Interval:  354 QTC Calculation: 426 R Axis:   46 Text Interpretation:  Sinus rhythm since last tracing no significant change Confirmed by Malvin Johns [phone]) on 12/11/2017 10:40:38 AM   Radiology Ct Head Code Stroke Wo Contrast  Result Date: 12/11/2017 CLINICAL DATA:  Code stroke. Right-sided weakness with dizziness and headache EXAM: CT HEAD WITHOUT CONTRAST TECHNIQUE: Contiguous axial images were obtained from the base of the skull through the vertex without intravenous contrast. COMPARISON:  09/17/2017 FINDINGS: Brain: No evidence of acute infarction, hemorrhage, hydrocephalus, extra-axial collection or mass lesion/mass effect. Vascular: No hyperdense vessel or unexpected calcification. Skull: Normal. Negative for fracture or focal lesion. Sinuses/Orbits: Right mastoidectomy. Eroded or postoperative ossicles on the right with stable appearance from 2017. Other: These results were communicated to Dr. Rory Percy at 10:09 amon 9/14/2019by text page via the [facility] messaging system. ASPECTS [facility] Stroke Program Early CT Score) - Ganglionic level infarction (caudate, lentiform nuclei, internal capsule, insula, M1-M3 cortex): 7 - Supraganglionic infarction (M4-M6 cortex): 3 Total score (0-10 with 10 being normal): 10 IMPRESSION: 1. Normal appearance of the brain. 2. Right mastoidectomy. Electronically Signed   By: Monte Fantasia M.D.   On: 12/11/2017 10:10    Procedures Procedures (including critical care time)  Medications Ordered in ED Medications - No data to display   Initial Impression / Assessment and Plan / ED Course  I have reviewed the triage vital signs and the nursing notes.  Pertinent labs & imaging results that were available during my care of the  patient were reviewed by me and considered in my medical decision making (see chart for details).     Patient is a 52 year old male who presented as a code stroke.  His symptoms have completely resolved.  His head CT is negative.  He has been seen by neurology who feels that patient can go home with no further imaging or evaluation.  Patient is feeling much better and requests outpatient resources for psychiatric follow-up for his anxiety.  These were given to him.  He was encouraged to have close follow-up with his PCP.  Return precautions were given.  Final Clinical Impressions(s) / ED Diagnoses   Final diagnoses:  Tension-type headache, not intractable, unspecified chronicity pattern    ED Discharge Orders    None       Malvin Johns, MD 12/11/17 1559

## 2017-12-11 NOTE — ED Triage Notes (Addendum)
Pt arrives via [facility], pt called a code stroke in the field, LSN 0900. Pt reported he began having a headache, feeling disoriented, dizzy and right sided weakness. Pt a/ox4, speech clear, no neuro deficits upon arrival. Pt met by stroke team and this RN and escorted to CT scan. Patient reports he was seen at [facility] yesterday for similar symptoms, was worked up and discharged home, reports a lot of stress at home recently  

## 2017-12-11 NOTE — Consult Note (Addendum)
Neurology Consultation  Reason for Consult: Right-sided weakness/code stroke Referring Physician: Dr. Tamera Punt  CC: Right-sided weakness, headache  History is obtained from: Patient  HPI: Jacob [name] is a 52 y.o. male with a PMH of Depression and anxiety  DM, HTN, hyperlipidemia, chronic pain syndrome.  He presents to the ER today as a code stroke for right sided weakness and abrupt onset of HA. He'd been in the ER on the prior day 9/13 at [facility] with c/o paraesthesia, blurry vision and HA. Apparently he took 4 Baby ASA and a xanax. Symptoms resolved.  He got up this morning without symptoms, then drove to get breakfast without any trouble using his right hand to eat or his right foot to drive. He states that about 0900 he has an abrupt HA, and subsequent R sided weakness. He states that his HA is frontal, intermittent and is subsequent to recent family stress, and work colleagues recent illness that has him "paranoid". He denies any facial weakness, trouble swallowing, vision impairment, numbness and tingling, or other stroke like symptoms. He states that he has been under heightened stress, with his son recently "overdosing" and relapse. His NIHSS was 0, and his symptoms were transient in nature and absent on exam. CT brain was negative for acute finding or acute findings or  Abnormalities. His systolic blood pressure trend went from 130's to mid 140's. He admits taking blood pressure medications this morning, but he does not monitor his pressures. He denies use of drugs, tobacco use and will only drink socially. All other workup is unremarkable for stroke.  LKW: 0900 tpa given?: no- Not require for NIHSS 0- symptoms transient in nature  Premorbid modified Rankin scale (mRS): 0 0-Completely asymptomatic and back to baseline post-stroke 1-No significant post stroke disability and can perform usual duties with stroke symptoms 2-Slight disability-UNABLE to perform all activities but does not need  assistance  3-Moderate disability-requires help but walks WITHOUT assistance 4-Needs assistance to walk and tend to bodily needs 5-Severe disability-bedridden, incontinent, needs constant attention 6- Death  ROS: A 14 point ROS was performed and is negative except as noted in the HPI.   Past Medical History:  Diagnosis Date  . Allergy   . Anxiety   . Cholesteatoma of right ear   . Diabetes mellitus ([place])    bordreline  . Enlarged prostate   . GERD (gastroesophageal reflux disease)   . Hernia of abdominal wall   . Hyperlipidemia 10/18/2014  . Hypertension     0-Completely asymptomatic and back to baseline post- stroke Essential (primary) hypertension, HLD,   Family History  Problem Relation Age of Onset  . COPD Mother   . Hypertension Mother   . Heart disease Father   . Diabetes Paternal Grandmother   . Diabetes Maternal Grandmother   . Lung cancer Maternal Uncle   . Colon cancer Neg Hx   . Esophageal cancer Neg Hx   . Rectal cancer Neg Hx   . Stomach cancer Neg Hx    Son- with drug abuse hx   Social History:   reports that he has never smoked. He has never used smokeless tobacco. He reports that he drinks about 5.0 standard drinks of alcohol per week. He reports that he does not use drugs.  Medications No current facility-administered medications for this encounter.   Current Outpatient Medications:  .  ALPRAZolam (XANAX) 0.5 MG tablet, TAKE 1 TO 2 TABLETS BY MOUTH AT BEDTIME AS NEEDED, Disp: 60 tablet, Rfl: 5 .  aspirin EC 81 MG tablet, Take 1 tablet (81 mg total) by mouth daily., Disp: 90 tablet, Rfl: 11 .  azithromycin (ZITHROMAX) 250 MG tablet, 2 tab by mouth day1 , then 1 per day, Disp: 6 tablet, Rfl: 1 .  citalopram (CELEXA) 20 MG tablet, Take 1 tablet (20 mg total) by mouth daily., Disp: 90 tablet, Rfl: 3 .  metFORMIN (GLUCOPHAGE-XR) 500 MG 24 hr tablet, Take 3 tablets (1,500 mg total) by mouth daily with breakfast., Disp: 270 tablet, Rfl: 3 .  Multiple  Vitamin (MULTIVITAMIN WITH MINERALS) TABS tablet, Take 1 tablet by mouth daily., Disp: , Rfl:  .  naproxen (NAPROSYN) 500 MG tablet, Take 500 mg by mouth 2 (two) times daily with a meal., Disp: , Rfl: 1 .  Omega-3 Fatty Acids (FISH OIL PO), Take 1 capsule by mouth daily., Disp: , Rfl:  .  pantoprazole (PROTONIX) 40 MG tablet, Take 1 tablet (40 mg total) by mouth daily., Disp: 90 tablet, Rfl: 3 .  pantoprazole (PROTONIX) 40 MG tablet, TAKE 1 TABLET BY MOUTH EVERY DAY, Disp: 90 tablet, Rfl: 3 .  ramipril (ALTACE) 10 MG capsule, Take 1 capsule (10 mg total) by mouth daily., Disp: 90 capsule, Rfl: 3 .  rosuvastatin (CRESTOR) 10 MG tablet, Take 1 tablet (10 mg total) by mouth daily., Disp: 90 tablet, Rfl: 3 .  tadalafil (CIALIS) 20 MG tablet, Take 1 tablet (20 mg total) by mouth daily as needed for erectile dysfunction., Disp: 10 tablet, Rfl: 11 .  triamcinolone (NASACORT) 55 MCG/ACT AERO nasal inhaler, Place 2 sprays into the nose daily., Disp: 1 Inhaler, Rfl: 12   Exam: Current vital signs: BP 138/86   Pulse 87   Temp 98.6 F (37 C)   Resp 20   Wt 85 kg   SpO2 100%   BMI 25.42 kg/m  Vital signs in last 24 hours: Temp:  [98.6 F (37 C)] 98.6 F (37 C) (09/14 1010) Pulse Rate:  [87-92] 87 (09/14 1015) Resp:  [18-20] 20 (09/14 1015) BP: (119-138)/(78-86) 138/86 (09/14 1015) SpO2:  [97 %-100 %] 100 % (09/14 1015) Weight:  [85 kg] 85 kg (09/14 0900)  GENERAL: Awake, alert in NAD HEENT: - Normocephalic and atraumatic, dry mm, no LN++, no Thyromegally LUNGS - No resp distress, lungs clear RRR CV -RRR, no m/r/g, equal pulses bilaterally. ABDOMEN - Soft, nontender, nondistended with normoactive BS Ext: warm, well perfused, intact peripheral pulses, no edema  NEURO:  Mental Status: AA&Ox3  Language: speech is clear without dysarthria or aphasia.  Naming, repetition, fluency, and comprehension intact. Cranial Nerves: PERRL 3 mm/brisk. EOMI, visual fields full, no facial asymmetry, facial  sensation intact, hearing intact, tongue/uvula/soft palate midline, normal sternocleidomastoid and trapezius muscle strength. No evidence of tongue atrophy or fibrillations Motor: - initial assessment functional with fluctuating exam on right,  5/5 RUE, RLE, LUE, LLE Tone: is normal and bulk is normal Sensation- Intact to light touch bilaterally Coordination: FTN intact bilaterally, no ataxia in BLE. Gait- deferred  NIHSS-0   Labs I have reviewed labs in epic and the results pertinent to this consultation are: Mild Cr elevation 1.37, glucose elevation 160's- unremarkable at this time   CBC    Component Value Date/Time   WBC 4.0 12/11/2017 0958   RBC 5.56 12/11/2017 0958   HGB 16.7 12/11/2017 1002   HCT 49.0 12/11/2017 1002   PLT 204 12/11/2017 0958   MCV 89.9 12/11/2017 0958   MCH 29.9 12/11/2017 0958   MCHC 33.2 12/11/2017 0958  RDW 12.9 12/11/2017 0958   LYMPHSABS 1.1 12/11/2017 0958   MONOABS 0.3 12/11/2017 0958   EOSABS 0.1 12/11/2017 0958   BASOSABS 0.0 12/11/2017 0958    CMP     Component Value Date/Time   NA 142 12/11/2017 1002   K 4.5 12/11/2017 1002   CL 108 12/11/2017 1002   CO2 24 12/11/2017 0958   GLUCOSE 168 (H) 12/11/2017 1002   BUN 13 12/11/2017 1002   CREATININE 1.10 12/11/2017 1002   CREATININE 1.37 (H) 10/07/2014 1202   CALCIUM 9.2 12/11/2017 0958   PROT 7.1 12/11/2017 0958   ALBUMIN 4.0 12/11/2017 0958   AST 20 12/11/2017 0958   ALT 23 12/11/2017 0958   ALKPHOS 60 12/11/2017 0958   BILITOT 1.0 12/11/2017 0958   GFRNONAA >60 12/11/2017 0958   GFRNONAA 61 10/07/2014 1202   GFRAA >60 12/11/2017 0958   GFRAA 70 10/07/2014 1202    Lipid Panel     Component Value Date/Time   CHOL 177 10/08/2017 1446   TRIG 57.0 10/08/2017 1446   HDL 57.60 10/08/2017 1446   CHOLHDL 3 10/08/2017 1446   VLDL 11.4 10/08/2017 1446   LDLCALC 108 (H) 10/08/2017 1446   LDLDIRECT 102.0 11/23/2016 1553     Imaging I have reviewed the images  obtained:  CT-scan of the brain 1. Normal appearance of the brain. 2. Right mastoidectomy.   Assessment: Jacob [name] is a 52 y.o. male with a PMH of Depression and anxiety  DM, HTN, hyperlipidemia, chronic pain syndrome.  He presents to the ER today as a code stroke for right sided weakness and abrupt onset of HA. CT brain was negative for acute abnormalities, or hemorrhage.   Ddx- Anxiety, HA  Impression: Although he does have risk factors for stroke, it appears that Jacob [name] history of anxiety, and depression is a key component of his re-occurrent stroke like symptoms with his recent onset of family and work- life stressors. There is also the possibility that his stress is causing him to have HA's. While at bedside his SBP elevate to mid 140's, with need for monitor, and better control.  There is no concern for stroke and no need for further imaging to r/o at this time as his symptoms were transient in nature, and NISS is 0  Recommendations: -Outpatient Psych f/u- for depression anxiety, and counseling- patient admits to paranoia as well that could drive stress.  - Blood pressure monitoring, and diary for trending pressure by patient to follow up with PCP for blood pressure control that could be contributing to HA's otherwise, there is no other neurological concerns at this time  Neurology to sign off and can be called for question and concerns.   Lilyan Gilford FNP- Neurology [facility]  Attending Neurohospitalist Addendum Patient seen and examined with APP/Resident. Agree with the history and physical as documented above. Agree with the plan as documented, which I helped formulate. I have independently reviewed the chart, obtained history, review of systems and examined the patient.I have personally reviewed pertinent head/neck/spine imaging (CT/MRI). Please feel free to call with any questions. --- Amie Portland, MD [facility] Pager: [phone]  If 7pm to 7am, please  call on call as listed on AMION.

## 2017-12-21 ENCOUNTER — Ambulatory Visit: Payer: 59 | Admitting: Internal Medicine

## 2017-12-21 ENCOUNTER — Encounter: Payer: Self-pay | Admitting: Internal Medicine

## 2017-12-21 VITALS — BP 122/80 | HR 70 | Temp 98.3°F | Ht 72.0 in | Wt 175.0 lb

## 2017-12-21 DIAGNOSIS — E119 Type 2 diabetes mellitus without complications: Secondary | ICD-10-CM

## 2017-12-21 DIAGNOSIS — F428 Other obsessive-compulsive disorder: Secondary | ICD-10-CM | POA: Diagnosis not present

## 2017-12-21 DIAGNOSIS — F410 Panic disorder [episodic paroxysmal anxiety] without agoraphobia: Secondary | ICD-10-CM | POA: Diagnosis not present

## 2017-12-21 DIAGNOSIS — R41840 Attention and concentration deficit: Secondary | ICD-10-CM | POA: Diagnosis not present

## 2017-12-21 MED ORDER — SERTRALINE HCL 100 MG PO TABS: 100.0000 mg | ORAL_TABLET | Freq: Every day | ORAL | 3 refills | Status: DC

## 2017-12-21 NOTE — Patient Instructions (Signed)
Please take all new medication as prescribed - the zoloft  Please follow up with your mental health provider as you can  Please continue all other medications as before, and refills have been done if requested.  Please have the pharmacy call with any other refills you may need.  Please continue your efforts at being more active, low cholesterol diet, and weight control.  Please keep your appointments with your specialists as you may have planned

## 2017-12-21 NOTE — Assessment & Plan Note (Signed)
stable overall by history and exam, recent data reviewed with pt, and pt to continue medical treatment as before,  to f/u any worsening symptoms or concerns Lab Results  Component Value Date   HGBA1C 6.4 10/08/2017

## 2017-12-21 NOTE — Progress Notes (Signed)
Subjective:    Patient ID: Jacob [name], male    DOB: Jun 28, 1965, 52 y.o.   MRN: [id number]  HPI  Here after ED visit sept 14 with HA, paresthesias and anxiety,  Never took the celexa, asks to restart the zoloft he took before.  Still focused on brain eating ameoba, scared to die all his life it seems. Stil with HA and dizzy, ear pressure despite augmentin x 2 wks per ENT.   Pt denies chest pain, increased sob or doe, wheezing, orthopnea, PND, increased LE swelling, palpitations, dizziness or syncope.  Pt denies new neurological symptoms such as new headache, or facial or extremity weakness or numbness Past Medical History:  Diagnosis Date  . Allergy   . Anxiety   . Cholesteatoma of right ear   . 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 Enlarged prostate   . GERD (gastroesophageal reflux disease)   . Hernia of abdominal wall   . Hyperlipidemia 10/18/2014  . Hypertension    Past Surgical History:  Procedure Laterality Date  . CHOLECYSTECTOMY    . [place]  . INNER EAR SURGERY     right  . LUMBAR DISC SURGERY    . tubes in bil ears      reports that he has never smoked. He has never used smokeless tobacco. He reports that he drinks about 5.0 standard drinks of alcohol per week. He reports that he does not use drugs. family history includes COPD in his mother; Diabetes in his maternal grandmother and paternal grandmother; Heart disease in his father; Hypertension in his mother; Lung cancer in his maternal uncle. No Known Allergies Current Outpatient Medications on File Prior to Visit  Medication Sig Dispense Refill  . ALPRAZolam (XANAX) 0.5 MG tablet TAKE 1 TO 2 TABLETS BY MOUTH AT BEDTIME AS NEEDED 60 tablet 5  . aspirin EC 81 MG tablet Take 1 tablet (81 mg total) by mouth daily. 90 tablet 11  . metFORMIN (GLUCOPHAGE-XR) 500 MG 24 hr tablet Take 3 tablets (1,500 mg total) by mouth daily with breakfast. 270 tablet 3  . Multiple Vitamin (MULTIVITAMIN WITH  MINERALS) TABS tablet Take 1 tablet by mouth daily.    . naproxen (NAPROSYN) 500 MG tablet Take 500 mg by mouth 2 (two) times daily with a meal.  1  . Omega-3 Fatty Acids (FISH OIL PO) Take 1 capsule by mouth daily.    . pantoprazole (PROTONIX) 40 MG tablet Take 1 tablet (40 mg total) by mouth daily. 90 tablet 3  . ramipril (ALTACE) 10 MG capsule Take 1 capsule (10 mg total) by mouth daily. 90 capsule 3  . rosuvastatin (CRESTOR) 10 MG tablet Take 1 tablet (10 mg total) by mouth daily. 90 tablet 3  . triamcinolone (NASACORT) 55 MCG/ACT AERO nasal inhaler Place 2 sprays into the nose daily. 1 Inhaler 12  . tadalafil (CIALIS) 20 MG tablet Take 1 tablet (20 mg total) by mouth daily as needed for erectile dysfunction. 10 tablet 11   No current facility-administered medications on file prior to visit.    Review of Systems  Constitutional: Negative for other unusual diaphoresis or sweats HENT: Negative for ear discharge or swelling Eyes: Negative for other worsening visual disturbances Respiratory: Negative for stridor or other swelling  Gastrointestinal: Negative for worsening distension or other blood Genitourinary: Negative for retention or other urinary change Musculoskeletal: Negative for other MSK pain or swelling Skin: Negative for color change or other new lesions Neurological:  Negative for worsening tremors and other numbness  Psychiatric/Behavioral: Negative for worsening agitation or other fatigue All other system neg per pt    Objective:   Physical Exam BP 122/80 (BP Location: Left Arm, Patient Position: Sitting, Cuff Size: Normal)   Pulse 70   Temp 98.3 F (36.8 C) (Oral)   Ht 6' (1.829 m)   Wt 175 lb (79.4 kg)   SpO2 98%   BMI 23.73 kg/m  VS noted,  Constitutional: Pt appears in NAD HENT: Head: NCAT.  Right Ear: External ear normal.  Left Ear: External ear normal.  Eyes: . Pupils are equal, round, and reactive to light. Conjunctivae and EOM are normal Nose: without d/c  or deformity Neck: Neck supple. Gross normal ROM Cardiovascular: Normal rate and regular rhythm.   Pulmonary/Chest: Effort normal and breath sounds without rales or wheezing.  Neurological: Pt is alert. At baseline orientation, motor grossly intact Skin: Skin is warm. No rashes, other new lesions, no LE edema Psychiatric: Pt behavior is normal without agitation , 2+ nervous No other exam findings Lab Results  Component Value Date   WBC 4.0 12/11/2017   HGB 16.7 12/11/2017   HCT 49.0 12/11/2017   PLT 204 12/11/2017   GLUCOSE 168 (H) 12/11/2017   CHOL 177 10/08/2017   TRIG 57.0 10/08/2017   HDL 57.60 10/08/2017   LDLDIRECT 102.0 11/23/2016   LDLCALC 108 (H) 10/08/2017   ALT 23 12/11/2017   AST 20 12/11/2017   NA 142 12/11/2017   K 4.5 12/11/2017   CL 108 12/11/2017   CREATININE 1.10 12/11/2017   BUN 13 12/11/2017   CO2 24 12/11/2017   TSH 1.19 10/08/2017   PSA 1.05 10/08/2017   INR 0.96 12/11/2017   HGBA1C 6.4 10/08/2017   MICROALBUR <0.7 10/08/2017         Assessment & Plan:

## 2017-12-21 NOTE — Assessment & Plan Note (Signed)
To restart zoloft, declines counseling referral

## 2017-12-21 NOTE — Assessment & Plan Note (Signed)
To restart zoloft, declines counseling referral tio r/o ADD

## 2017-12-24 ENCOUNTER — Encounter: Payer: Self-pay | Admitting: Internal Medicine

## 2018-01-17 ENCOUNTER — Encounter: Payer: Self-pay | Admitting: Mental Health

## 2018-01-17 ENCOUNTER — Ambulatory Visit (INDEPENDENT_AMBULATORY_CARE_PROVIDER_SITE_OTHER): Payer: 59 | Admitting: Mental Health

## 2018-01-17 DIAGNOSIS — F411 Generalized anxiety disorder: Secondary | ICD-10-CM

## 2018-01-17 DIAGNOSIS — F429 Obsessive-compulsive disorder, unspecified: Secondary | ICD-10-CM

## 2018-01-17 DIAGNOSIS — F410 Panic disorder [episodic paroxysmal anxiety] without agoraphobia: Secondary | ICD-10-CM
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
## 2018-01-17 NOTE — Addendum Note (Signed)
Addended by: Logan Bores on: 01/17/2018 04:55 PM   Modules accepted: Level of Service

## 2018-02-03 ENCOUNTER — Ambulatory Visit: Payer: 59 | Admitting: Mental Health

## 2018-02-11 ENCOUNTER — Ambulatory Visit: Payer: 59 | Admitting: Mental Health

## 2018-02-11 DIAGNOSIS — F411 Generalized anxiety disorder: Secondary | ICD-10-CM

## 2018-02-11 NOTE — Progress Notes (Signed)
      [facility] Counselor/Therapist Progress Note   Patient ID: Jacob [name], MRN: [id number]  Date: 02/11/2018  Timespent: 45 minutes   Treatment Type: Individual   Reported Symptoms: Obsessive thinking, Sleep disturbance and fearful   Mental Status Exam:    Appearance:   work Nurse, mental health     Behavior:  Appropriate  Motor:  Restlestness  Speech/Language:   Normal Rate  Affect:  anxious  Mood:  anxious  Thought process:  normal  Thought content:    WNL  Sensory/Perceptual disturbances:    WNL  Orientation:  oriented to person, place and time/date  Attention:  Fair  Concentration:  Fair  Memory:  [place] of knowledge:   Fair  Insight:    Fair  Judgment:   Good  Impulse Control:  Good     Risk Assessment: Danger to Self:  No Self-injurious Behavior: No Danger to Others: No Duty to Warn:no Physical Aggression / Violence:No  Access to Firearms a concern: No  Gang Involvement:No    Subjective:  Worried about his son who is on drugs. Wakes up in the middle of the night with anxiety attacks. Making plans for holidays with family.  Interventions: Cognitive Behavioral Therapy, Insight-Oriented, Interpersonal and supportive   Diagnosis:Generalized anxiety  F41.1   Plan:  More positive and reassuring self talk            Medication compliance            Lower level of anxiety            Validation and support   [name], [facility]

## 2018-02-28 ENCOUNTER — Encounter: Payer: Self-pay | Admitting: Mental Health

## 2018-02-28 ENCOUNTER — Ambulatory Visit (INDEPENDENT_AMBULATORY_CARE_PROVIDER_SITE_OTHER): Payer: 59 | Admitting: Mental Health

## 2018-02-28 DIAGNOSIS — F411 Generalized anxiety disorder: Secondary | ICD-10-CM | POA: Diagnosis not present

## 2018-02-28 NOTE — Progress Notes (Signed)
      [facility] Counselor/Therapist Progress Note  Patient ID: Jacob [name], MRN: [id number],    Date: 02/28/2018  Time Spent: 45 minutes   Treatment Type: Individual Therapy  Reported Symptoms: Depressed mood, Anxious Mood, Sleep disturbance, Irritability, Fatigue, Concentration impairments, Psychomotor agitation and Appetite disturbance.   Mental Status Exam:  Appearance:   Casual     Behavior:  Appropriate  Motor:  Restlestness  Speech/Language:   Normal Rate  Affect:  not as edgy, no fear of dying  Mood:  anxious, irritable and aggravated, frustrated  Thought process:  tangential  Thought content:    Obsessions  Sensory/Perceptual disturbances:    episodes of forgetfulness  Orientation:  oriented to person, place and time/date  Attention:  Poor  Concentration:  negative  Memory:  foggy  Fund of knowledge:   Fair  Insight:    Fair  Judgment:   Good  Impulse Control:  Good   Risk Assessment: Danger to Self:  No Self-injurious Behavior: No Danger to Others: No Duty to Warn:no Physical Aggression / Violence:No  Access to Firearms a concern: No  Gang Involvement:No   Subjective:  Head feels light headed, hands shaking episodically. Worries about unforeseen outcomes, death, dementia. Fearful. Fearful of forgetting things - scatter brained, doesn't remember where he puts things. Fears losing his memory. Gets disoriented momentarily. Gets obsessed.  Interventions: Cognitive Behavioral Therapy, Solution-Oriented/Positive Psychology, Insight-Oriented, Family Systems, Interpersonal and supportive  Diagnosis:   ICD-10-CM   1. Generalized anxiety disorder F41.1     Plan:    Improve and make more positive self talk              Investigate questions of faith              Decrease level of anxiety              Validation and support  Jacob [name], [facility]

## 2018-03-04 ENCOUNTER — Ambulatory Visit: Payer: 59 | Admitting: Psychiatry

## 2018-03-10 ENCOUNTER — Encounter: Payer: Self-pay | Admitting: Psychiatry

## 2018-03-10 ENCOUNTER — Ambulatory Visit (INDEPENDENT_AMBULATORY_CARE_PROVIDER_SITE_OTHER): Payer: 59 | Admitting: Psychiatry

## 2018-03-10 VITALS — BP 112/72 | HR 69 | Ht 72.0 in | Wt 180.0 lb

## 2018-03-10 DIAGNOSIS — F410 Panic disorder [episodic paroxysmal anxiety] without agoraphobia: Secondary | ICD-10-CM | POA: Diagnosis not present

## 2018-03-10 DIAGNOSIS — F422 Mixed obsessional thoughts and acts: Secondary | ICD-10-CM

## 2018-03-10 DIAGNOSIS — F411 Generalized anxiety disorder: Secondary | ICD-10-CM

## 2018-03-10 MED ORDER — FLUOXETINE HCL 10 MG PO CAPS: ORAL_CAPSULE | ORAL | 1 refills | Status: DC

## 2018-03-10 NOTE — Progress Notes (Signed)
[facility] MD/PA/NP Initial Note  03/10/2018 7:49 PM Jacob [name]  MRN:  [id number]  Chief Complaint:  Chief Complaint    Anxiety      HPI: Patient is a 52 year old male who presents for initial evaluation for treatment of anxiety.  He is referred by his therapist, [name], LPC.  Reports that PCP started him on Sertraline and that it has been helpful for his anxiety but has caused him to be more forgetful and tired. Reports that he would have to stay in bed all weekend when taking Sertraline 100 mg po qd. Reports that he decreased Sertraline to 50 mg po qd and has not noticed a significant difference in concentration and memory. Reports that he is unable to remember things that he just did. Reports that he took 50 mg for about 2-3 weeks and decreased to 25 mg about 4 days ago.   Patient reports that he has had long-standing difficulty with anxiety since childhood. Reports catastrophic thinking. Reports chronic worry. He reports that he will experience "nervous stomach" with anxiety. Reports some anxiety around big groups of people and social situations.    Reports that he will "freeze" when put on the spot. Reports some exaggerated startle response. Reports nightmares.  Reports that he previously had irrational fear re: death and after life since early childhood. Reports that he has had obsessive thoughts about death and beliefs. Reports that negative events "can shake my faith" despite not wanting to question religious beliefs. Reports that he knew someone who died of a rare parasite and the following summer he had intrusive thoughts that he would contract the same parasite if he gets in water. Reports that he has intrusive thoughts that he has a serious medical issue and will have to check his BP and pulse or seek urgent medical treatment. Reports that about a year ago he started washing and sanitizing his hands frequently to prevent illness. Reports that he frequently checks behind  himself at work. Repots that he compulsively checks appliances at home. Had one episode of intrusive thought of self harm without intent. Reports that he walks over cracks in the side walk. Will count stripes when driving. Avoids certain numbers, like 13 and 666.   Awakens at night with panic s/s. Reports that he usually does not have panic attacks during the day. Reports that he has had a few episodes where he is afraid he is going to "forget everything" and feels lightheaded.  "I never get depressed." Reports increased "irritation." reports that he would not be able to fall asleep without Xanax. He estimates sleeping 5-6 hours a night. Reports that he is not as tired on Sertraline 50 mg po qd and thinking is not "as cloudy." Appetite has decreased overall and will fluctuate. Denies anhedonia. Denies SI.   Reports that he has chronic difficulty with concentration and focus. Reports that he often loses interest in tasks before completing them. Reports that he frequently fidgets and would get in trouble for talking in class and not staying in his seat.   Denies any h/o decreased need for sleep, risky/impulsive behavior, elevated mood, etc. Reports that he has always worked 2 jobs to help pay child support.   Born and raised in [place], [place]. Has a younger brother. Reports that he had a "good" childhood. Reports that he had difficulty learning. 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 Reports that he enjoys his job. Married and divorced twice. Currently in a relationship and  he lives with her and her mother. Has 3 children- 2 daughters and 1 son. One daughter has a different mother. Reports that he has a few life long friends. Reports that his current girlfriend does not want him to communicate with one male friend that he has had for years. Enjoys playing basketball and most sports.   Psychiatric Medication Trials: Sertraline- mild tremor. Xanax- Has taken since he was 86 or 52  yo.  (Reports that multiple family members have had adverse reactions to [facility]- irrational thoughts, impulsive behavior, etc).  Visit Diagnosis:    ICD-10-CM   1. Mixed obsessional thoughts and acts F42.2 FLUoxetine (PROZAC) 10 MG capsule  2. Generalized anxiety disorder F41.1 FLUoxetine (PROZAC) 10 MG capsule  3. Panic disorder F41.0 FLUoxetine (PROZAC) 10 MG capsule    Past Psychiatric History: Recently started seeing [name], [facility] for therapy. Reports that he briefly saw mental health around 6-20 yo after head injury.   Past Medical History:  Past Medical History:  Diagnosis Date  . Allergy   . Anxiety   . Cholesteatoma of right ear   . 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 Enlarged prostate   . GERD (gastroesophageal reflux disease)   . Hernia of abdominal wall   . History of head injury   . Hyperlipidemia 10/18/2014  . Hypertension     Past Surgical History:  Procedure Laterality Date  . CHOLECYSTECTOMY    . Arial  . INNER EAR SURGERY     right  . LUMBAR DISC SURGERY    . tubes in bil ears      Family History:  Family History  Problem Relation Age of Onset  . COPD Mother   . Hypertension Mother   . Anxiety disorder Mother   . Heart disease Father   . Cancer Father   . Diabetes Paternal Grandmother   . Diabetes Maternal Grandmother   . Anxiety disorder Maternal Grandmother   . Heart attack Brother   . Lung cancer Maternal Uncle   . Anxiety disorder Maternal Uncle   . Anxiety disorder Maternal Aunt   . OCD Maternal Aunt   . Anxiety disorder Cousin   . ADD / ADHD Son   . Drug abuse Son   . Colon cancer Neg Hx   . Esophageal cancer Neg Hx   . Rectal cancer Neg Hx   . Stomach cancer Neg Hx     Social History:  Social History   Socioeconomic History  . Marital status: Single    Spouse name: Not on file  . Number of children: 3  . Years of education: Not on file  . Highest education level: Not on file   Occupational History  . Occupation: Music therapist: [employer]  . Financial resource strain: Not on file  . Food insecurity:    Worry: Not on file    Inability: Not on file  . Transportation needs:    Medical: Not on file    Non-medical: Not on file  Tobacco Use  . Smoking status: Never Smoker  . Smokeless tobacco: Never Used  Substance and Sexual Activity  . Alcohol use: Yes    Alcohol/week: 5.0 standard drinks    Types: 5 Glasses of wine per week    Comment: social  . Drug use: No  . Sexual activity: Yes    Birth control/protection: None  Lifestyle  . Physical activity:    Days per  week: Not on file    Minutes per session: Not on file  . Stress: Not on file  Relationships  . Social connections:    Talks on phone: Not on file    Gets together: Not on file    Attends religious service: Not on file    Active member of club or organization: Not on file    Attends meetings of clubs or organizations: Not on file    Relationship status: Not on file  Other Topics Concern  . Not on file  Social History Narrative  . Not on file    Allergies: No Known Allergies  Metabolic Disorder Labs: Lab Results  Component Value Date   HGBA1C 6.4 10/08/2017   No results found for: PROLACTIN Lab Results  Component Value Date   CHOL 177 10/08/2017   TRIG 57.0 10/08/2017   HDL 57.60 10/08/2017   CHOLHDL 3 10/08/2017   VLDL 11.4 10/08/2017   LDLCALC 108 (H) 10/08/2017   LDLCALC 122 (H) 06/04/2016   Lab Results  Component Value Date   TSH 1.19 10/08/2017   TSH 2.60 11/23/2016    Therapeutic Level Labs: No results found for: LITHIUM No results found for: VALPROATE No components found for:  CBMZ  Current Medications: Current Outpatient Medications  Medication Sig Dispense Refill  . ALPRAZolam (XANAX) 0.5 MG tablet TAKE 1 TO 2 TABLETS BY MOUTH AT BEDTIME AS NEEDED 60 tablet 5  . aspirin EC 81 MG tablet Take 1 tablet (81 mg total) by mouth daily. 90  tablet 11  . metFORMIN (GLUCOPHAGE-XR) 500 MG 24 hr tablet Take 3 tablets (1,500 mg total) by mouth daily with breakfast. 270 tablet 3  . Multiple Vitamin (MULTIVITAMIN WITH MINERALS) TABS tablet Take 1 tablet by mouth daily.    . naproxen (NAPROSYN) 500 MG tablet Take 500 mg by mouth 2 (two) times daily as needed.   1  . Omega-3 Fatty Acids (FISH OIL PO) Take 1 capsule by mouth daily.    . pantoprazole (PROTONIX) 40 MG tablet Take 1 tablet (40 mg total) by mouth daily. 90 tablet 3  . ramipril (ALTACE) 10 MG capsule Take 1 capsule (10 mg total) by mouth daily. 90 capsule 3  . rosuvastatin (CRESTOR) 10 MG tablet Take 1 tablet (10 mg total) by mouth daily. 90 tablet 3  . sertraline (ZOLOFT) 100 MG tablet Take 1 tablet (100 mg total) by mouth daily. (Patient taking differently: Take 25 mg by mouth daily. ) 90 tablet 3  . FLUoxetine (PROZAC) 10 MG capsule Take 1 capsule po qd for one week, then increase to 2 capsules po qd 60 capsule 1  . tadalafil (CIALIS) 20 MG tablet Take 1 tablet (20 mg total) by mouth daily as needed for erectile dysfunction. 10 tablet 11  . triamcinolone (NASACORT) 55 MCG/ACT AERO nasal inhaler Place 2 sprays into the nose daily. (Patient not taking: Reported on 03/10/2018) 1 Inhaler 12   No current facility-administered medications for this visit.     Medication Side Effects: fatigue/weakness and affective dulling, tremor, impaired concentration  Orders placed this visit:  No orders of the defined types were placed in this encounter.   Psychiatric Specialty Exam:  ROS  Blood pressure 112/72, pulse 69, height 6' (1.829 m), weight 180 lb (81.6 kg).Body mass index is 24.41 kg/m.  General Appearance: Casual  Eye Contact:  Good  Speech:  Clear and Coherent and Increased rate  Volume:  Normal  Mood:  Anxious  Affect:  Anxious  Thought Process:  Coherent  Orientation:  Full (Time, Place, and Person)  Thought Content: Logical   Suicidal Thoughts:  No  Homicidal  Thoughts:  No  Memory:  WNL  Judgement:  Good  Insight:  Good  Psychomotor Activity:  Increased and Restlessness  Concentration:  Concentration: Fair  Recall:  Good  Fund of Knowledge: Good  Language: Good  Assets:  Communication Skills Desire for Improvement Resilience  ADL's:  Intact  Cognition: WNL  Prognosis:  Good   Screenings:  PHQ2-9     Office Visit from 10/08/2017 in [place] Office Visit from 08/29/2015 in [place] Office Visit from 10/07/2014 in [facility] from 05/09/2014 in [facility] Visit from 01/11/2013 in [facility]  PHQ-2 Total Score  0  0  0  1  0      Receiving Psychotherapy: Yes   Treatment Plan/Recommendations: Patient seen for 60 minutes and greater than 50% of visit spent counseling patient regarding anxiety signs and symptoms to include discussing obsessions and compulsions.  Reviewed medications used to treat anxiety and their proposed mechanism of action.  Discussed that he would likely respond to another SSRI since he noticed some improvement in his anxiety with sertraline, and that other SSRIs are not associated with the side effects that he has experienced with sertraline.  Discussed potential benefits, risks, and side effects of fluoxetine since it would be least likely to cause fatigue and drowsiness which he had with higher doses of sertraline.  Discussed starting with low dose and gradually increasing as tolerated considering personal and family history of medication sensitivity.  Will start Prozac 10 mg daily for 1 week for anxiety, then increase to 20 mg daily.  [place] further titration of Prozac may be needed to fully control anxiety signs and symptoms.  Patient to continue sertraline 25 mg for 1 week and then discontinue.  Recommend continuing psychotherapy with [name], LPC.    Thayer Headings, PMHNP

## 2018-03-25 ENCOUNTER — Telehealth: Payer: Self-pay | Admitting: Psychiatry

## 2018-03-25 NOTE — Telephone Encounter (Signed)
Patient called to report that his change in medications is not working.  You took him off zoloft due its side affects and started Prozac 20mg .  He has also been trying to come off the xanax but is having panic attacks at night.  So this isn't working.  Please call to advise.  [phone].  Next appt 04/15/18

## 2018-03-28 ENCOUNTER — Ambulatory Visit (INDEPENDENT_AMBULATORY_CARE_PROVIDER_SITE_OTHER): Payer: 59 | Admitting: Mental Health

## 2018-03-28 ENCOUNTER — Encounter: Payer: Self-pay | Admitting: Mental Health

## 2018-03-28 DIAGNOSIS — F411 Generalized anxiety disorder: Secondary | ICD-10-CM | POA: Diagnosis not present

## 2018-03-28 NOTE — Progress Notes (Signed)
      [facility] Counselor/Therapist Progress Note  Patient ID: Jacob [name], MRN: [id number],    Date: 03/28/2018  Time Spent: 45 minutes  Treatment Type: Individual Therapy  Reported Symptoms: Waking up with panic attacks feeling like he is having a heart attack. Anxiety increased with Zoloft. D/C Zoloft and starting Prozac. Sleep is interrupted and he takes a small part of a .5 Xanax.  Mental Status Exam:  Appearance:   Casual     Behavior:  Appropriate and Sharing  Motor:  Normal  Speech/Language:   Normal Rate  Affect:  Appropriate  Mood:  anxious  Thought process:  Racing, some obsessing sometimes  Thought content:    WNL  Sensory/Perceptual disturbances:    WNL  Orientation:  oriented to person, place and time/date  Attention:  Good  Concentration:  Good  Memory:  WNL  Fund of knowledge:   Good  Insight:    Good  Judgment:   Good  Impulse Control:  Good   Risk Assessment: Danger to Self:  No Self-injurious Behavior: No Danger to Others: No Duty to Warn:no Physical Aggression / Violence:No  Access to Firearms a concern: No  Gang Involvement:No   Subjective:  Not as anxious aout getting sick or dying. Still has anxious thoughts. Keeps faither in God.  Interventions: Cognitive Behavioral Therapy, Solution-Oriented/Positive Psychology, Insight-Oriented, Interpersonal and supportive  Diagnosis:   ICD-10-CM   1. Generalized anxiety disorder F41.1     Plan:   Self care program             Self talk             Decrease anxiety             Continue researching faith       [name], [facility]

## 2018-03-29 NOTE — Telephone Encounter (Signed)
Returned call to pt. He reports that he saw [name], [facility] yesterday. He reports that that last week he had panic attacks 3 nights in a row when he was trying to come off of Xanax. He reports that he usually takes a 1/2 tab of Xanax and will take a 1/4 tab if he experiences middle of the night awakening. He reports that he feels that he is now "feeling a lot better" and attributes this to getting off of Sertraline and possibly Prozac. Reports that PCP prescribed him 2 week supply of Xanax. Agree to continue taking Xanax as he has previously and to continue Prozac since more time is needed to see full response.

## 2018-04-15 ENCOUNTER — Ambulatory Visit (INDEPENDENT_AMBULATORY_CARE_PROVIDER_SITE_OTHER): Payer: 59 | Admitting: Psychiatry

## 2018-04-15 ENCOUNTER — Ambulatory Visit (INDEPENDENT_AMBULATORY_CARE_PROVIDER_SITE_OTHER): Payer: 59 | Admitting: Mental Health

## 2018-04-15 ENCOUNTER — Encounter: Payer: Self-pay | Admitting: Psychiatry

## 2018-04-15 ENCOUNTER — Encounter: Payer: Self-pay | Admitting: Mental Health

## 2018-04-15 VITALS — BP 113/72 | HR 61

## 2018-04-15 DIAGNOSIS — F411 Generalized anxiety disorder: Secondary | ICD-10-CM | POA: Diagnosis not present

## 2018-04-15 DIAGNOSIS — F410 Panic disorder [episodic paroxysmal anxiety] without agoraphobia: Secondary | ICD-10-CM

## 2018-04-15 DIAGNOSIS — F422 Mixed obsessional thoughts and acts: Secondary | ICD-10-CM

## 2018-04-15 MED ORDER — FLUOXETINE HCL 40 MG PO CAPS: 40.0000 mg | ORAL_CAPSULE | Freq: Every day | ORAL | 1 refills | Status: DC

## 2018-04-15 NOTE — Progress Notes (Signed)
Jacob [name] [id number] 03-Oct-1965 53 y.o.  Subjective:   Patient ID:  Jacob [name] is a 53 y.o. (DOB 02-Feb-1966) male.  Chief Complaint:  Chief Complaint  Patient presents with  . Anxiety    HPI Jacob [name] presents to the office today for follow-up of anxiety. He reports that he is tired from working extra hours. He reports anxiety has been "decent." He reports that he typically takes 3/4 of a Xanax at bedtime and will take remaining 1/4 if he awakens in the night. He reports that he has had about 2 panic attacks in the last month. He reports that he occasionally will forget something and start over-thinking it. Reports that he had some intrusive thoughts about possibly having a "brain-eating amoeba" when he had recent virus. Reports that he continues to occasionally think that he may have a serious medical condition with different physical c/o.   "I'm getting to where I am not as scared as I used to be." He reports that he has always been afraid to die because of being taught conflicting things about the after life. He reports that he is not obsessing about this as much as he was previously. He reports that he rarely has any panic s/s during the day. Reports that he occasionally feels SOB and thinks it may be related to reflux. He reports that his mood is good- "I never get depressed." Estimates sleeping 6-7 hours a night. Reports that he does not have difficulty falling asleep. He reports that his appetite has been lower than usual and some foods are no longer as appealing. He reports that his energy and motivation have been good. Denies SI.   "That cloudiness I had with the zoloft kind of went away." Reports concentration is better than it was. Reports that he will occasionally have difficulty with concentration.   Reports that son was sent to jail last night. He reports that this situation is stressful for him  Psychiatric Medication Trials: Sertraline- mild  tremor. Prozac. Xanax- Has taken since he was 53 or 53 yo. Ambien- parasomnia  Review of Systems:  Review of Systems  Constitutional:       Reports recent URI with fever, body aches  Gastrointestinal:       Occasional indigestion  Musculoskeletal: Negative for gait problem.  Neurological: Negative for tremors.       Reports that he occasionally has "a weird feeling" in his head that he does not consider a HA. Reports that he has some mild dizziness.   Psychiatric/Behavioral:       Please refer to HPI    Medications: I have reviewed the patient's current medications.  Current Outpatient Medications  Medication Sig Dispense Refill  . ALPRAZolam (XANAX) 0.5 MG tablet TAKE 1 TO 2 TABLETS BY MOUTH AT BEDTIME AS NEEDED 60 tablet 5  . aspirin EC 81 MG tablet Take 1 tablet (81 mg total) by mouth daily. 90 tablet 11  . metFORMIN (GLUCOPHAGE-XR) 500 MG 24 hr tablet Take 3 tablets (1,500 mg total) by mouth daily with breakfast. 270 tablet 3  . Multiple Vitamin (MULTIVITAMIN WITH MINERALS) TABS tablet Take 1 tablet by mouth daily.    . naproxen (NAPROSYN) 500 MG tablet Take 500 mg by mouth 2 (two) times daily as needed.   1  . Omega-3 Fatty Acids (FISH OIL PO) Take 1 capsule by mouth daily.    . pantoprazole (PROTONIX) 40 MG tablet Take 1 tablet (40 mg total) by  mouth daily. 90 tablet 3  . ramipril (ALTACE) 10 MG capsule Take 1 capsule (10 mg total) by mouth daily. 90 capsule 3  . rosuvastatin (CRESTOR) 10 MG tablet Take 1 tablet (10 mg total) by mouth daily. 90 tablet 3  . FLUoxetine (PROZAC) 40 MG capsule Take 1 capsule (40 mg total) by mouth daily for 30 days. 30 capsule 1  . tadalafil (CIALIS) 20 MG tablet Take 1 tablet (20 mg total) by mouth daily as needed for erectile dysfunction. 10 tablet 11  . triamcinolone (NASACORT) 55 MCG/ACT AERO nasal inhaler Place 2 sprays into the nose daily. (Patient not taking: Reported on 04/15/2018) 1 Inhaler 12   No current facility-administered  medications for this visit.     Medication Side Effects: Other: Dry mouth  Allergies: No Known Allergies  Past Medical History:  Diagnosis Date  . Allergy   . Anxiety   . Cholesteatoma of right ear   . 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 Enlarged prostate   . GERD (gastroesophageal reflux disease)   . Hernia of abdominal wall   . History of head injury   . Hyperlipidemia 10/18/2014  . Hypertension     Family History  Problem Relation Age of Onset  . COPD Mother   . Hypertension Mother   . Anxiety disorder Mother   . Heart disease Father   . Cancer Father   . Diabetes Paternal Grandmother   . Diabetes Maternal Grandmother   . Anxiety disorder Maternal Grandmother   . Heart attack Brother   . Lung cancer Maternal Uncle   . Anxiety disorder Maternal Uncle   . Anxiety disorder Maternal Aunt   . OCD Maternal Aunt   . Anxiety disorder Cousin   . ADD / ADHD Son   . Drug abuse Son   . Colon cancer Neg Hx   . Esophageal cancer Neg Hx   . Rectal cancer Neg Hx   . Stomach cancer Neg Hx     Social History   Socioeconomic History  . Marital status: Single    Spouse name: Not on file  . Number of children: 3  . Years of education: Not on file  . Highest education level: Not on file  Occupational History  . Occupation: Music therapist: [employer]  . Financial resource strain: Not on file  . Food insecurity:    Worry: Not on file    Inability: Not on file  . Transportation needs:    Medical: Not on file    Non-medical: Not on file  Tobacco Use  . Smoking status: Never Smoker  . Smokeless tobacco: Never Used  Substance and Sexual Activity  . Alcohol use: Yes    Alcohol/week: 5.0 standard drinks    Types: 5 Glasses of wine per week    Comment: social  . Drug use: No  . Sexual activity: Yes    Partners: Female    Birth control/protection: None  Lifestyle  . Physical activity:    Days per week: Not on file    Minutes per session:  Not on file  . Stress: Not on file  Relationships  . Social connections:    Talks on phone: Not on file    Gets together: Not on file    Attends religious service: Not on file    Active member of club or organization: Not on file    Attends meetings of clubs or organizations: Not on file  Relationship status: Not on file  . Intimate partner violence:    Fear of current or ex partner: Not on file    Emotionally abused: Not on file    Physically abused: Not on file    Forced sexual activity: Not on file  Other Topics Concern  . Not on file  Social History Narrative  . Not on file    Past Medical History, Surgical history, Social history, and Family history were reviewed and updated as appropriate.   Please see review of systems for further details on the patient's review from today.   Objective:   Physical Exam:  BP 113/72   Pulse 61   Physical Exam Constitutional:      General: He is not in acute distress.    Appearance: He is well-developed.  Musculoskeletal:        General: No deformity.  Neurological:     Mental Status: He is alert and oriented to person, place, and time.     Coordination: Coordination normal.  Psychiatric:        Mood and Affect: Mood is anxious. Mood is not depressed. Affect is not labile, blunt, angry or inappropriate.        Speech: Speech normal.        Behavior: Behavior normal.        Thought Content: Thought content normal. Thought content does not include homicidal or suicidal ideation. Thought content does not include homicidal or suicidal plan.        Judgment: Judgment normal.     Comments: Insight intact. No auditory or visual hallucinations. No delusions.      Lab Review:     Component Value Date/Time   NA 142 12/11/2017 1002   K 4.5 12/11/2017 1002   CL 108 12/11/2017 1002   CO2 24 12/11/2017 0958   GLUCOSE 168 (H) 12/11/2017 1002   BUN 13 12/11/2017 1002   CREATININE 1.10 12/11/2017 1002   CREATININE 1.37 (H) 10/07/2014  1202   CALCIUM 9.2 12/11/2017 0958   PROT 7.1 12/11/2017 0958   ALBUMIN 4.0 12/11/2017 0958   AST 20 12/11/2017 0958   ALT 23 12/11/2017 0958   ALKPHOS 60 12/11/2017 0958   BILITOT 1.0 12/11/2017 0958   GFRNONAA >60 12/11/2017 0958   GFRNONAA 61 10/07/2014 1202   GFRAA >60 12/11/2017 0958   GFRAA 70 10/07/2014 1202       Component Value Date/Time   WBC 4.0 12/11/2017 0958   RBC 5.56 12/11/2017 0958   HGB 16.7 12/11/2017 1002   HCT 49.0 12/11/2017 1002   PLT 204 12/11/2017 0958   MCV 89.9 12/11/2017 0958   MCH 29.9 12/11/2017 0958   MCHC 33.2 12/11/2017 0958   RDW 12.9 12/11/2017 0958   LYMPHSABS 1.1 12/11/2017 0958   MONOABS 0.3 12/11/2017 0958   EOSABS 0.1 12/11/2017 0958   BASOSABS 0.0 12/11/2017 0958    No results found for: POCLITH, LITHIUM   No results found for: PHENYTOIN, PHENOBARB, VALPROATE, CBMZ   .res Assessment: Plan:   Will increase Prozac to 40 mg po qd for anxiety since pt has had a partial improvement in anxiety, panic, and obsessions on Prozac 20 mg po qd.  Mixed obsessional thoughts and acts - Plan: FLUoxetine (PROZAC) 40 MG capsule  Panic disorder - Plan: FLUoxetine (PROZAC) 40 MG capsule  Generalized anxiety disorder - Plan: FLUoxetine (PROZAC) 40 MG capsule  Please see After Visit Summary for patient specific instructions.  Future Appointments  Date Time Provider Department  Center  05/27/2018  2:30 PM Thayer Headings, PMHNP [facility] None  05/27/2018  4:00 PM [name], LPC [facility] None    No orders of the defined types were placed in this encounter.     -------------------------------

## 2018-04-15 NOTE — Progress Notes (Signed)
      [facility] Counselor/Therapist Progress Note  Patient ID: RISHON THILGES, MRN: [id number],    Date: 04/15/2018  Time Spent: 45 minutes  Treatment Type: Individual Therapy  Reported Symptoms: Anxious Mood  Mental Status Exam:  Appearance:   Casual     Behavior:  Appropriate and Sharing  Motor:  Normal  Speech/Language:   Normal Rate  Affect:  edgy  Mood:  anxious  Thought process:  normal  Thought content:    WNL and Rumination  Sensory/Perceptual disturbances:    WNL  Orientation:  oriented to person, place and time/date  Attention:  Good  Concentration:  Fair  Memory:  WNL  Fund of knowledge:   Good  Insight:    Good  Judgment:   Good  Impulse Control:  Good   Risk Assessment: Danger to Self:  No Self-injurious Behavior: No Danger to Others: No Duty to Warn:no Physical Aggression / Violence:No  Access to Firearms a concern: No  Gang Involvement:No   Subjective:    Interventions: Cognitive Behavioral Therapy, Solution-Oriented/Positive Psychology, Insight-Oriented, Family Systems and Interpersonal  Diagnosis:   ICD-10-CM   1. Generalized anxiety disorder F41.1     Plan: Improve self talk           Self care program           Decrease anxiety attacks           Validation and support     [name], [facility]

## 2018-04-18 ENCOUNTER — Telehealth: Payer: Self-pay | Admitting: Psychiatry

## 2018-04-18 NOTE — Telephone Encounter (Signed)
Patient stated since the increase of Prozac he is experiencing knots in his stomach no appetite and nausea. Despite all the discomfort the medication is helping his anxiety, Please advise.

## 2018-04-18 NOTE — Telephone Encounter (Signed)
Please advise 

## 2018-04-19 NOTE — Telephone Encounter (Signed)
Given instructions and recommendations, he verbalized understanding. Instructed to call back if not improving.

## 2018-04-22 ENCOUNTER — Encounter: Payer: Self-pay | Admitting: Internal Medicine

## 2018-04-22 ENCOUNTER — Ambulatory Visit: Payer: 59 | Admitting: Internal Medicine

## 2018-04-22 ENCOUNTER — Other Ambulatory Visit (INDEPENDENT_AMBULATORY_CARE_PROVIDER_SITE_OTHER): Payer: 59

## 2018-04-22 VITALS — BP 108/70 | HR 70 | Temp 98.5°F | Ht 72.0 in | Wt 188.0 lb

## 2018-04-22 DIAGNOSIS — R310 Gross hematuria: Secondary | ICD-10-CM | POA: Diagnosis not present

## 2018-04-22 DIAGNOSIS — R399 Unspecified symptoms and signs involving the genitourinary system: Secondary | ICD-10-CM

## 2018-04-22 DIAGNOSIS — R300 Dysuria: Secondary | ICD-10-CM | POA: Diagnosis not present

## 2018-04-22 DIAGNOSIS — F418 Other specified anxiety disorders: Secondary | ICD-10-CM | POA: Diagnosis not present

## 2018-04-22 DIAGNOSIS — Z202 Contact with and (suspected) exposure to infections with a predominantly sexual mode of transmission: Secondary | ICD-10-CM

## 2018-04-22 LAB — URINALYSIS, ROUTINE W REFLEX MICROSCOPIC
BILIRUBIN URINE: NEGATIVE
KETONES UR: NEGATIVE
LEUKOCYTES UA: NEGATIVE
NITRITE: NEGATIVE
Specific Gravity, Urine: <=1.005 — AB (ref 1.000–1.030)
Total Protein, Urine: NEGATIVE
Urine Glucose: NEGATIVE
Urobilinogen, UA: 0.2 (ref 0.0–1.0)
pH: 6.5 (ref 5.0–8.0)

## 2018-04-22 LAB — CBC WITH DIFFERENTIAL/PLATELET
BASOS ABS: 0 10*3/uL (ref 0.0–0.1)
Basophils Relative: 0.5 % (ref 0.0–3.0)
Eosinophils Absolute: 0.1 10*3/uL (ref 0.0–0.7)
Eosinophils Relative: 1.6 % (ref 0.0–5.0)
HCT: 44.5 % (ref 39.0–52.0)
Hemoglobin: 15.4 g/dL (ref 13.0–17.0)
LYMPHS ABS: 1.8 10*3/uL (ref 0.7–4.0)
Lymphocytes Relative: 31.6 % (ref 12.0–46.0)
MCHC: 34.5 g/dL (ref 30.0–36.0)
MCV: 85.9 fl (ref 78.0–100.0)
MONO ABS: 0.3 10*3/uL (ref 0.1–1.0)
Monocytes Relative: 6 % (ref 3.0–12.0)
Neutro Abs: 3.5 10*3/uL (ref 1.4–7.7)
Neutrophils Relative %: 60.3 % (ref 43.0–77.0)
Platelets: 282 10*3/uL (ref 150.0–400.0)
RBC: 5.18 Mil/uL (ref 4.22–5.81)
RDW: 13.1 % (ref 11.5–15.5)
WBC: 5.8 10*3/uL (ref 4.0–10.5)

## 2018-04-22 LAB — BASIC METABOLIC PANEL
BUN: 12 mg/dL (ref 6–23)
CO2: 26 mEq/L (ref 19–32)
Calcium: 9.6 mg/dL (ref 8.4–10.5)
Chloride: 104 mEq/L (ref 96–112)
Creatinine, Ser: 1.15 mg/dL (ref 0.40–1.50)
GFR: 67.02 mL/min (ref >60.00)
GLUCOSE: 164 mg/dL — AB (ref 70–99)
Potassium: 4.1 mEq/L (ref 3.5–5.1)
SODIUM: 138 meq/L (ref 135–145)

## 2018-04-22 LAB — POCT URINALYSIS DIPSTICK
Bilirubin, UA: NEGATIVE
GLUCOSE UA: NEGATIVE
Ketones, UA: NEGATIVE
LEUKOCYTES UA: NEGATIVE
Nitrite, UA: NEGATIVE
Protein, UA: NEGATIVE
SPEC GRAV UA: 1.015 (ref 1.010–1.025)
UROBILINOGEN UA: 0.2 U/dL
pH, UA: 6 (ref 5.0–8.0)

## 2018-04-22 MED ORDER — ALPRAZOLAM 0.5 MG PO TABS: 0.5000 mg | ORAL_TABLET | Freq: Every evening | ORAL | 5 refills | Status: DC | PRN

## 2018-04-22 MED ORDER — CIPROFLOXACIN HCL 500 MG PO TABS: 500.0000 mg | ORAL_TABLET | Freq: Two times a day (BID) | ORAL | 0 refills | Status: AC

## 2018-04-22 NOTE — Patient Instructions (Signed)
Please take all new medication as prescribed - the antibiotic  Please continue all other medications as before, and refills have been done if requested - xanax  Please have the pharmacy call with any other refills you may need.  Please keep your appointments with your specialists as you may have planned  Please go to the LAB in the Basement (turn left off the elevator) for the tests to be done today  You will be contacted by phone if any changes need to be made immediately.  Otherwise, you will receive a letter about your results with an explanation, but please check with MyChart first.  Please remember to sign up for MyChart if you have not done so, as this will be important to you in the future with finding out test results, communicating by private email, and scheduling acute appointments online when needed.

## 2018-04-22 NOTE — Progress Notes (Signed)
Subjective:    Patient ID: Jacob [name], male    DOB: 02/18/1966, 53 y.o.   MRN: [id number]  HPI  Here with 2 days onset intermittent blood in the urine bright red, sometimes with dysuria, but Denies urinary symptoms such as frequency, urgency, flank pain, or n/v, fever, chills.  Has hx of remote kidney stone, does not seem the same with his symptoms now. No known exposure to STD but has unprotected intercourse. Did take the zolfot and helped anxiety but could not tolerate, now on prozac per psychiatry, but wants to consider stopping it since he is doing so well.  Denies worsening depressive symptoms, suicidal ideation, or panic; Pt denies chest pain, increased sob or doe, wheezing, orthopnea, PND, increased LE swelling, palpitations, dizziness or syncope.  Pt denies new neurological symptoms such as new headache, or facial or extremity weakness or numbness   Pt denies polydipsia, polyuria Past Medical History:  Diagnosis Date  . Allergy   . Anxiety   . Cholesteatoma of right ear   . 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 Enlarged prostate   . GERD (gastroesophageal reflux disease)   . Hernia of abdominal wall   . History of head injury   . Hyperlipidemia 10/18/2014  . Hypertension    Past Surgical History:  Procedure Laterality Date  . CHOLECYSTECTOMY    . [place]  . INNER EAR SURGERY     right  . LUMBAR DISC SURGERY    . tubes in bil ears      reports that he has never smoked. He has never used smokeless tobacco. He reports current alcohol use of about 5.0 standard drinks of alcohol per week. He reports that he does not use drugs. family history includes ADD / ADHD in his son; Anxiety disorder in his cousin, maternal aunt, maternal grandmother, maternal uncle, and mother; COPD in his mother; Cancer in his father; Diabetes in his maternal grandmother and paternal grandmother; Drug abuse in his son; Heart attack in his brother; Heart disease in his father;  Hypertension in his mother; Lung cancer in his maternal uncle; OCD in his maternal aunt. No Known Allergies Current Outpatient Medications on File Prior to Visit  Medication Sig Dispense Refill  . aspirin EC 81 MG tablet Take 1 tablet (81 mg total) by mouth daily. 90 tablet 11  . FLUoxetine (PROZAC) 40 MG capsule Take 1 capsule (40 mg total) by mouth daily for 30 days. 30 capsule 1  . metFORMIN (GLUCOPHAGE-XR) 500 MG 24 hr tablet Take 3 tablets (1,500 mg total) by mouth daily with breakfast. 270 tablet 3  . Multiple Vitamin (MULTIVITAMIN WITH MINERALS) TABS tablet Take 1 tablet by mouth daily.    . naproxen (NAPROSYN) 500 MG tablet Take 500 mg by mouth 2 (two) times daily as needed.   1  . Omega-3 Fatty Acids (FISH OIL PO) Take 1 capsule by mouth daily.    . pantoprazole (PROTONIX) 40 MG tablet Take 1 tablet (40 mg total) by mouth daily. 90 tablet 3  . ramipril (ALTACE) 10 MG capsule Take 1 capsule (10 mg total) by mouth daily. 90 capsule 3  . rosuvastatin (CRESTOR) 10 MG tablet Take 1 tablet (10 mg total) by mouth daily. 90 tablet 3  . triamcinolone (NASACORT) 55 MCG/ACT AERO nasal inhaler Place 2 sprays into the nose daily. 1 Inhaler 12  . tadalafil (CIALIS) 20 MG tablet Take 1 tablet (20 mg total) by mouth daily as  needed for erectile dysfunction. 10 tablet 11   No current facility-administered medications on file prior to visit.    Review of Systems  Constitutional: Negative for other unusual diaphoresis or sweats HENT: Negative for ear discharge or swelling Eyes: Negative for other worsening visual disturbances Respiratory: Negative for stridor or other swelling  Gastrointestinal: Negative for worsening distension or other blood Genitourinary: Negative for retention or other urinary change Musculoskeletal: Negative for other MSK pain or swelling Skin: Negative for color change or other new lesions Neurological: Negative for worsening tremors and other numbness    Psychiatric/Behavioral: Negative for worsening agitation or other fatigue All other system neg per pt    Objective:   Physical Exam BP 108/70   Pulse 70   Temp 98.5 F (36.9 C) (Oral)   Ht 6' (1.829 m)   Wt 188 lb (85.3 kg)   SpO2 98%   BMI 25.50 kg/m  VS noted,  Constitutional: Pt appears in NAD HENT: Head: NCAT.  Right Ear: External ear normal.  Left Ear: External ear normal.  Eyes: . Pupils are equal, round, and reactive to light. Conjunctivae and EOM are normal Nose: without d/c or deformity Neck: Neck supple. Gross normal ROM Cardiovascular: Normal rate and regular rhythm.   Pulmonary/Chest: Effort normal and breath sounds without rales or wheezing.  Abd:  Soft, NT, ND, + BS, no organomegaly Neurological: Pt is alert. At baseline orientation, motor grossly intact Skin: Skin is warm. No rashes, other new lesions, no LE edema Psychiatric: Pt behavior is normal without agitation , 1+ nervous but improved No other exam findings Lab Results  Component Value Date   WBC 5.8 04/22/2018   HGB 15.4 04/22/2018   HCT 44.5 04/22/2018   PLT 282.0 04/22/2018   GLUCOSE 164 (H) 04/22/2018   CHOL 177 10/08/2017   TRIG 57.0 10/08/2017   HDL 57.60 10/08/2017   LDLDIRECT 102.0 11/23/2016   LDLCALC 108 (H) 10/08/2017   ALT 23 12/11/2017   AST 20 12/11/2017   NA 138 04/22/2018   K 4.1 04/22/2018   CL 104 04/22/2018   CREATININE 1.15 04/22/2018   BUN 12 04/22/2018   CO2 26 04/22/2018   TSH 1.19 10/08/2017   PSA 1.05 10/08/2017   INR 0.96 12/11/2017   HGBA1C 6.4 10/08/2017   MICROALBUR <0.7 10/08/2017        Assessment & Plan:

## 2018-04-23 NOTE — Assessment & Plan Note (Signed)
For STD testing as ordered, exam benign

## 2018-04-23 NOTE — Assessment & Plan Note (Addendum)
Per pt report, for urine studies including culture, consider ct to r/o stone if culture neg

## 2018-04-23 NOTE — Assessment & Plan Note (Signed)
Improved, encourage pt to continue the prozac

## 2018-04-23 NOTE — Assessment & Plan Note (Addendum)
Mild, afeb, for antibx course,  F/u culture results, to f/u any worsening symptoms or concerns

## 2018-04-25 ENCOUNTER — Telehealth: Payer: Self-pay | Admitting: Internal Medicine

## 2018-04-25 ENCOUNTER — Other Ambulatory Visit: Payer: 59

## 2018-04-25 DIAGNOSIS — Z202 Contact with and (suspected) exposure to infections with a predominantly sexual mode of transmission: Secondary | ICD-10-CM

## 2018-04-25 LAB — URINE CULTURE
MICRO NUMBER: 101449
RESULT: NO GROWTH
SPECIMEN QUALITY: ADEQUATE

## 2018-04-25 LAB — RPR: RPR: NONREACTIVE

## 2018-04-25 LAB — HIV ANTIBODY (ROUTINE TESTING W REFLEX): HIV 1&2 Ab, 4th Generation: NONREACTIVE

## 2018-04-25 NOTE — Telephone Encounter (Signed)
Pt was informed of results earlier during phone conversation. Will let him know that GC test has been properly ordered and can come by at his convenience.

## 2018-04-25 NOTE — Addendum Note (Signed)
Addended by: Raliegh Ip on: 04/25/2018 04:15 PM   Modules accepted: Orders

## 2018-04-25 NOTE — Telephone Encounter (Signed)
Discussed results with pt and informed pt that the STD was not entered correctly and lab staff was not able to see it in the system to do it while he was here friday. He expressed understanding. Would any additional rest needs to be done besides the STD test before I have the pt come back in to complete it? STD needs to be ordered as "future"  Please advise.

## 2018-04-25 NOTE — Telephone Encounter (Signed)
Ok this test was ordered as future

## 2018-04-25 NOTE — Telephone Encounter (Addendum)
Copied from [place] [phone]. Topic: Quick Communication - See Telephone Encounter >> Apr 25, 2018 11:28 AM Ivar Drape wrote: CRM for notification. See Telephone encounter for: 04/25/18. Patient was in to see Dr. Jenny Reichmann last week for blood in his urine and he was given antibiotics to take.  Today he stated the blood is no better in his urine and now there is a good amount of it in his semen.  Please advise.

## 2018-04-25 NOTE — Telephone Encounter (Signed)
Urine test was negative for blood or infeciton.  All STD testing was negative.  Blood in semen is always benign and does not require further evaluation.

## 2018-04-27 LAB — GC/CHLAMYDIA PROBE AMP
Chlamydia trachomatis, NAA: NEGATIVE
Neisseria gonorrhoeae by PCR: NEGATIVE

## 2018-04-29 ENCOUNTER — Telehealth: Payer: Self-pay | Admitting: Psychiatry

## 2018-04-29 ENCOUNTER — Other Ambulatory Visit: Payer: Self-pay | Admitting: Psychiatry

## 2018-04-29 ENCOUNTER — Other Ambulatory Visit: Payer: Self-pay

## 2018-04-29 DIAGNOSIS — F422 Mixed obsessional thoughts and acts: Secondary | ICD-10-CM

## 2018-04-29 DIAGNOSIS — F411 Generalized anxiety disorder: Secondary | ICD-10-CM

## 2018-04-29 DIAGNOSIS — F410 Panic disorder [episodic paroxysmal anxiety] without agoraphobia: Secondary | ICD-10-CM

## 2018-04-29 MED ORDER — FLUOXETINE HCL 40 MG PO CAPS: 40.0000 mg | ORAL_CAPSULE | Freq: Every day | ORAL | 0 refills | Status: DC

## 2018-04-29 NOTE — Telephone Encounter (Signed)
Patient trying to purchase a firearm. Would like to know if his anxiety diagnosis has anything to do with the hold up.

## 2018-04-29 NOTE — Telephone Encounter (Signed)
Patient trying to purchase a firearm. Would

## 2018-05-02 ENCOUNTER — Other Ambulatory Visit: Payer: Self-pay | Admitting: Urology

## 2018-05-02 NOTE — Telephone Encounter (Signed)
Please review/advise

## 2018-05-03 ENCOUNTER — Other Ambulatory Visit: Payer: Self-pay | Admitting: Psychiatry

## 2018-05-03 DIAGNOSIS — F411 Generalized anxiety disorder: Secondary | ICD-10-CM

## 2018-05-03 DIAGNOSIS — F410 Panic disorder [episodic paroxysmal anxiety] without agoraphobia: Secondary | ICD-10-CM

## 2018-05-03 DIAGNOSIS — F422 Mixed obsessional thoughts and acts: Secondary | ICD-10-CM

## 2018-05-03 NOTE — Telephone Encounter (Signed)
Pt.notified

## 2018-05-04 ENCOUNTER — Encounter (HOSPITAL_BASED_OUTPATIENT_CLINIC_OR_DEPARTMENT_OTHER): Payer: Self-pay

## 2018-05-04 ENCOUNTER — Other Ambulatory Visit: Payer: Self-pay

## 2018-05-04 NOTE — Progress Notes (Signed)
Spoke with: Jacob [name]  NPO:  After Midnight, no gum, candy, or mints   Arrival time:  0530AM Labs: BMP AM medications: Prozac, Pantoprazole, Crestor Pre op orders: Yes Ride home:  Anderson [place] (friend) [phone]

## 2018-05-09 NOTE — H&P (Signed)
Urology Preoperative H&P   Chief Complaint: Hematuria   History of Present Illness: Jacob [name] is a 53 y.o. 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   Last PSA- 1.05 (09/2017). No personal/family history of prostate cancer.   04/27/18: The patient was last seen in 2018 for suspected acute prostatitis and worsening lower urinary tract symptoms.   -Currently off of tamsulosin. He states that it caused retrograde ejaculation.  -He reports intermittent episodes of gross hematuria since for the past 5 days. The hematuria is associated w/ suprapubic pressure and dysuria.  -He has been on cipro since last Friday with little improvement in his sxs. He states that a urine culture from Dr. Gwynn Burly office was negative for a UTI. Recent STI screen was negative.  -He is a non-smoker, but was exposed to 2nd hand smoke during his childhood  -No recent GU surgery/trauma  -No personal/family history of kidney or bladder cancer   From a urinary standpoint, he reports a weak FOS, but feels like he is emptying his bladder the majority of the time. No recent change in the caliber of his urinary stream. He has occasional urgency/frequency, but is not bothered by it. Nocturia x 2-3.   I attempted to perform cystoscopy on 04/29/18, but his urethral meatus was too stenotic.  He is here today for a cystoscopy with EUA to complete his hematuria evaluation.   Past Medical History:  Diagnosis Date  . Allergic rhinitis   . Anxiety   . Arthritis   . Cholesteatoma of right ear   . 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 Dysuria   . Enlarged prostate    pt unaware  . GERD (gastroesophageal reflux disease)   . Headache   . Hearing loss    Right ear  . Hernia of abdominal wall   . History of gross hematuria   . History of head injury   . History of kidney stones   .  History of palpitations   . Hyperlipidemia 10/18/2014  . Hypertension   . Insomnia   . Lack of concentration   . Nasal fracture    Several  . Otitis media 11/10/2017   Bilateral  . Panic attacks   . Syncope   . Vertigo     Past Surgical History:  Procedure Laterality Date  . CHOLECYSTECTOMY    . [place]  . INNER EAR SURGERY     right  . LUMBAR DISC SURGERY    . MASTOIDECTOMY Right   . NOSE SURGERY     Fracture  . tubes in bil ears    . UPPER GI ENDOSCOPY  01/09/2012   Esophageal dilation    Allergies: No Known Allergies  Family History  Problem Relation Age of Onset  . COPD Mother   . Hypertension Mother   . Anxiety disorder Mother   . Heart disease Father   . Cancer Father   . Diabetes Paternal Grandmother   . Diabetes Maternal Grandmother   . Anxiety disorder Maternal Grandmother   . Heart attack Brother   . Lung cancer Maternal Uncle   . Anxiety disorder Maternal Uncle   . Anxiety disorder Maternal Aunt   . OCD Maternal Aunt   . Anxiety disorder Cousin   . ADD / ADHD Son   . Drug abuse Son   . Colon  cancer Neg Hx   . Esophageal cancer Neg Hx   . Rectal cancer Neg Hx   . Stomach cancer Neg Hx     Social History:  reports that he has never smoked. He has never used smokeless tobacco. He reports current alcohol use of about 5.0 standard drinks of alcohol per week. He reports that he does not use drugs.  ROS: A complete review of systems was performed.  All systems are negative except for pertinent findings as noted.  Physical Exam:  Vital signs in last 24 hours:   Constitutional:  Alert and oriented, No acute distress Cardiovascular: Regular rate and rhythm, No JVD Respiratory: Normal respiratory effort, Lungs clear bilaterally GI: Abdomen is soft, nontender, nondistended, no abdominal masses GU: No CVA tenderness Lymphatic: No lymphadenopathy Neurologic: Grossly intact, no focal deficits Psychiatric: Normal mood and  affect  Laboratory Data:  No results for input(s): WBC, HGB, HCT, PLT in the last 72 hours.  No results for input(s): NA, K, CL, GLUCOSE, BUN, CALCIUM, CREATININE in the last 72 hours.  Invalid input(s): CO3   No results found for this or any previous visit (from the past 24 hour(s)). No results found for this or any previous visit (from the past 240 hour(s)).  Renal Function: No results for input(s): CREATININE in the last 168 hours. Estimated Creatinine Clearance: 83.4 mL/min (by C-G formula based on SCr of 1.15 mg/dL).  Radiologic Imaging: No results found.  I independently reviewed the above imaging studies.  Assessment and Plan Jacob [name] is a 53 y.o. male with gross hematuria, hypospadias and meatal stenosis  -The risks, benefits and alternatives of cystoscopy with possible urethral dilation and OIP was discussed with the patient.  The risks include, but are not limited to, bleeding, urinary tract infection, urethral stricture recurrence, need for prolonged Foley catheterization as well as inherent risks of general anesthesia.  He voices understanding and wishes to proceed.  Ellison Hughs, MD 05/09/2018, 4:57 PM  [facility] Pager: [phone]

## 2018-05-09 NOTE — Anesthesia Preprocedure Evaluation (Addendum)
Anesthesia Evaluation  Patient identified by MRN, date of birth, ID band Patient awake    Reviewed: Allergy & Precautions, NPO status , Patient's Chart, lab work & pertinent test results  History of Anesthesia Complications Negative for: history of anesthetic complications  Airway Mallampati: II  TM Distance: >3 FB Neck ROM: Full    Dental  (+) Teeth Intact   Pulmonary neg pulmonary ROS,    Pulmonary exam normal breath sounds clear to auscultation       Cardiovascular hypertension, Pt. on medications Normal cardiovascular exam Rhythm:Regular Rate:Normal     Neuro/Psych Anxiety Depression negative neurological ROS     GI/Hepatic Neg liver ROS, GERD  Controlled and Medicated,  Endo/Other  diabetes, Type 2, Oral Hypoglycemic Agents  Renal/GU negative Renal ROS     Musculoskeletal  (+) Arthritis ,   Abdominal   Peds  Hematology negative hematology ROS (+)   Anesthesia Other Findings Day of surgery medications reviewed with the patient.  Reproductive/Obstetrics                           Anesthesia Physical Anesthesia Plan  ASA: II  Anesthesia Plan: MAC   Post-op Pain Management:    Induction:   PONV Risk Score and Plan: Treatment may vary due to age or medical condition, Propofol infusion and Ondansetron  Airway Management Planned: Natural Airway and Simple Face Mask  Additional Equipment:   Intra-op Plan:   Post-operative Plan:   Informed Consent: I have reviewed the patients History and Physical, chart, labs and discussed the procedure including the risks, benefits and alternatives for the proposed anesthesia with the patient or authorized representative who has indicated his/her understanding and acceptance.       Plan Discussed with: CRNA  Anesthesia Plan Comments:        Anesthesia Quick Evaluation

## 2018-05-10 ENCOUNTER — Other Ambulatory Visit: Payer: Self-pay

## 2018-05-10 ENCOUNTER — Ambulatory Visit (HOSPITAL_BASED_OUTPATIENT_CLINIC_OR_DEPARTMENT_OTHER): Payer: 59 | Admitting: Anesthesiology

## 2018-05-10 ENCOUNTER — Encounter (HOSPITAL_BASED_OUTPATIENT_CLINIC_OR_DEPARTMENT_OTHER): Payer: Self-pay | Admitting: *Deleted

## 2018-05-10 ENCOUNTER — Encounter (HOSPITAL_BASED_OUTPATIENT_CLINIC_OR_DEPARTMENT_OTHER)
Admission: RE | Disposition: A | Discharge status: Home / Self Care | Payer: Self-pay | Source: Ambulatory Visit | Attending: Urology

## 2018-05-10 ENCOUNTER — Ambulatory Visit (HOSPITAL_BASED_OUTPATIENT_CLINIC_OR_DEPARTMENT_OTHER)
Admission: RE | Admit: 2018-05-10 | Discharge: 2018-05-10 | Disposition: A | Discharge status: Home / Self Care | Payer: 59 | Source: Ambulatory Visit | Attending: Urology | Admitting: Urology

## 2018-05-10 DIAGNOSIS — N35911 Unspecified urethral stricture, male, meatal: Secondary | ICD-10-CM | POA: Insufficient documentation

## 2018-05-10 DIAGNOSIS — Z79899 Other long term (current) drug therapy: Secondary | ICD-10-CM | POA: Insufficient documentation

## 2018-05-10 DIAGNOSIS — K219 Gastro-esophageal reflux disease without esophagitis: Secondary | ICD-10-CM | POA: Insufficient documentation

## 2018-05-10 DIAGNOSIS — F419 Anxiety disorder, unspecified: Secondary | ICD-10-CM | POA: Insufficient documentation

## 2018-05-10 DIAGNOSIS — N529 Male erectile dysfunction, unspecified: Secondary | ICD-10-CM | POA: Insufficient documentation

## 2018-05-10 DIAGNOSIS — E119 Type 2 diabetes mellitus without complications: Secondary | ICD-10-CM | POA: Insufficient documentation

## 2018-05-10 DIAGNOSIS — Z7984 Long term (current) use of oral hypoglycemic drugs: Secondary | ICD-10-CM | POA: Insufficient documentation

## 2018-05-10 DIAGNOSIS — Z87442 Personal history of urinary calculi: Secondary | ICD-10-CM | POA: Diagnosis not present

## 2018-05-10 DIAGNOSIS — I10 Essential (primary) hypertension: Secondary | ICD-10-CM | POA: Diagnosis not present

## 2018-05-10 DIAGNOSIS — G4700 Insomnia, unspecified: Secondary | ICD-10-CM | POA: Insufficient documentation

## 2018-05-10 DIAGNOSIS — M1990 Unspecified osteoarthritis, unspecified site: Secondary | ICD-10-CM | POA: Diagnosis not present

## 2018-05-10 DIAGNOSIS — N401 Enlarged prostate with lower urinary tract symptoms: Secondary | ICD-10-CM | POA: Insufficient documentation

## 2018-05-10 DIAGNOSIS — R310 Gross hematuria: Secondary | ICD-10-CM | POA: Insufficient documentation

## 2018-05-10 DIAGNOSIS — Q549 Hypospadias, unspecified: Secondary | ICD-10-CM | POA: Diagnosis not present

## 2018-05-10 HISTORY — DX: Attention and concentration deficit: R41.840

## 2018-05-10 HISTORY — DX: Fracture of nasal bones, initial encounter for closed fracture: S02.2XXA

## 2018-05-10 HISTORY — DX: Dysuria: R30.0

## 2018-05-10 HISTORY — DX: Allergic rhinitis, unspecified: J30.9

## 2018-05-10 HISTORY — DX: Headache: R51

## 2018-05-10 HISTORY — DX: Personal history of urinary calculi: Z87.442

## 2018-05-10 HISTORY — DX: Otitis media, unspecified, unspecified ear: H66.90

## 2018-05-10 HISTORY — DX: Personal history of other specified conditions: Z87.898

## 2018-05-10 HISTORY — DX: Insomnia, unspecified: G47.00

## 2018-05-10 HISTORY — DX: Panic disorder (episodic paroxysmal anxiety): F41.0

## 2018-05-10 HISTORY — DX: Headache, unspecified: R51.9

## 2018-05-10 HISTORY — DX: Personal history of other diseases of urinary system: Z87.448

## 2018-05-10 HISTORY — DX: Dizziness and giddiness: R42

## 2018-05-10 HISTORY — DX: Unspecified osteoarthritis, unspecified site: M19.90

## 2018-05-10 HISTORY — PX: CYSTOSCOPY: SHX5120

## 2018-05-10 HISTORY — DX: Unspecified hearing loss, unspecified ear: H91.90

## 2018-05-10 HISTORY — DX: Syncope and collapse: R55

## 2018-05-10 LAB — BASIC METABOLIC PANEL
Anion gap: 8 (ref 5–15)
BUN: 15 mg/dL (ref 6–20)
CO2: 26 mmol/L (ref 22–32)
Calcium: 9.1 mg/dL (ref 8.9–10.3)
Chloride: 103 mmol/L (ref 98–111)
Creatinine, Ser: 1.11 mg/dL (ref 0.61–1.24)
GFR calc Af Amer: >60 mL/min (ref >60)
GFR calc non Af Amer: >60 mL/min (ref >60)
Glucose, Bld: 147 mg/dL — ABNORMAL HIGH (ref 70–99)
Potassium: 4.5 mmol/L (ref 3.5–5.1)
Sodium: 137 mmol/L (ref 135–145)

## 2018-05-10 LAB — GLUCOSE, CAPILLARY: Glucose-Capillary: 175 mg/dL — ABNORMAL HIGH (ref 70–99)

## 2018-05-10 SURGERY — CYSTOSCOPY
Anesthesia: Monitor Anesthesia Care | Site: Bladder

## 2018-05-10 MED ORDER — ACETAMINOPHEN 10 MG/ML IV SOLN
1000.0000 mg | Freq: Once | INTRAVENOUS | Status: DC | PRN
  Filled 2018-05-10: qty 100

## 2018-05-10 MED ORDER — ONDANSETRON HCL 4 MG/2ML IJ SOLN
INTRAMUSCULAR | Status: AC
  Filled 2018-05-10: qty 2

## 2018-05-10 MED ORDER — ARTIFICIAL TEARS OPHTHALMIC OINT
TOPICAL_OINTMENT | OPHTHALMIC | Status: AC
  Filled 2018-05-10: qty 3.5

## 2018-05-10 MED ORDER — STERILE WATER FOR IRRIGATION IR SOLN
Status: DC | PRN
  Administered 2018-05-10: 3000 mL

## 2018-05-10 MED ORDER — LIDOCAINE 2% (20 MG/ML) 5 ML SYRINGE
INTRAMUSCULAR | Status: AC
  Filled 2018-05-10: qty 5

## 2018-05-10 MED ORDER — PROPOFOL 10 MG/ML IV BOLUS
INTRAVENOUS | Status: AC
  Filled 2018-05-10: qty 20

## 2018-05-10 MED ORDER — DEXAMETHASONE SODIUM PHOSPHATE 10 MG/ML IJ SOLN
INTRAMUSCULAR | Status: AC
  Filled 2018-05-10: qty 1

## 2018-05-10 MED ORDER — ONDANSETRON HCL 4 MG/2ML IJ SOLN
INTRAMUSCULAR | Status: DC | PRN
  Administered 2018-05-10: 4 mg via INTRAVENOUS

## 2018-05-10 MED ORDER — FENTANYL CITRATE (PF) 100 MCG/2ML IJ SOLN
INTRAMUSCULAR | Status: DC | PRN
  Administered 2018-05-10: 50 ug via INTRAVENOUS

## 2018-05-10 MED ORDER — FENTANYL CITRATE (PF) 100 MCG/2ML IJ SOLN
INTRAMUSCULAR | Status: AC
  Filled 2018-05-10: qty 2

## 2018-05-10 MED ORDER — MIDAZOLAM HCL 2 MG/2ML IJ SOLN
INTRAMUSCULAR | Status: AC
  Filled 2018-05-10: qty 2

## 2018-05-10 MED ORDER — PROMETHAZINE HCL 25 MG/ML IJ SOLN
6.2500 mg | INTRAMUSCULAR | Status: DC | PRN
  Filled 2018-05-10: qty 1

## 2018-05-10 MED ORDER — PROPOFOL 10 MG/ML IV BOLUS
INTRAVENOUS | Status: DC | PRN
  Administered 2018-05-10: 50 mg via INTRAVENOUS

## 2018-05-10 MED ORDER — PROPOFOL 500 MG/50ML IV EMUL
INTRAVENOUS | Status: AC
  Filled 2018-05-10: qty 50

## 2018-05-10 MED ORDER — DEXAMETHASONE SODIUM PHOSPHATE 4 MG/ML IJ SOLN
INTRAMUSCULAR | Status: DC | PRN
  Administered 2018-05-10: 8 mg via INTRAVENOUS

## 2018-05-10 MED ORDER — CEFAZOLIN SODIUM-DEXTROSE 2-4 GM/100ML-% IV SOLN
INTRAVENOUS | Status: AC
  Filled 2018-05-10: qty 100

## 2018-05-10 MED ORDER — LIDOCAINE HCL (CARDIAC) PF 100 MG/5ML IV SOSY
PREFILLED_SYRINGE | INTRAVENOUS | Status: DC | PRN
  Administered 2018-05-10: 50 mg via INTRAVENOUS

## 2018-05-10 MED ORDER — CEFAZOLIN SODIUM-DEXTROSE 2-4 GM/100ML-% IV SOLN
2.0000 g | Freq: Once | INTRAVENOUS | Status: AC
  Administered 2018-05-10: 2 g via INTRAVENOUS
  Filled 2018-05-10: qty 100

## 2018-05-10 MED ORDER — MIDAZOLAM HCL 5 MG/5ML IJ SOLN
INTRAMUSCULAR | Status: DC | PRN
  Administered 2018-05-10: 2 mg via INTRAVENOUS

## 2018-05-10 MED ORDER — PROPOFOL 500 MG/50ML IV EMUL
INTRAVENOUS | Status: DC | PRN
  Administered 2018-05-10: 150 ug/kg/min via INTRAVENOUS

## 2018-05-10 MED ORDER — LACTATED RINGERS IV SOLN
INTRAVENOUS | Status: DC
  Administered 2018-05-10: 06:00:00 via INTRAVENOUS
  Filled 2018-05-10: qty 1000

## 2018-05-10 SURGICAL SUPPLY — 27 items
BAG DRAIN URO-CYSTO SKYTR STRL (DRAIN) ×3 IMPLANT
BAG DRN UROCATH (DRAIN) ×1
BAG URINE DRAINAGE (UROLOGICAL SUPPLIES) ×1 IMPLANT
BALLN NEPHROSTOMY (BALLOONS)
BALLOON NEPHROSTOMY (BALLOONS) IMPLANT
CATH FOLEY 2W COUNCIL 5CC 18FR (CATHETERS) IMPLANT
CATH ROBINSON RED A/P 14FR (CATHETERS) IMPLANT
CLOTH BEACON ORANGE TIMEOUT ST (SAFETY) ×3 IMPLANT
ELECT REM PT RETURN 9FT ADLT (ELECTROSURGICAL) ×3
ELECTRODE REM PT RTRN 9FT ADLT (ELECTROSURGICAL) ×1 IMPLANT
GLOVE BIO SURGEON STRL SZ7.5 (GLOVE) ×3 IMPLANT
GLOVE BIOGEL PI IND STRL 8.5 (GLOVE) IMPLANT
GLOVE BIOGEL PI INDICATOR 8.5 (GLOVE) ×4
GLOVE INDICATOR 8.5 STRL (GLOVE) ×2 IMPLANT
GOWN STRL REUS W/ TWL XL LVL3 (GOWN DISPOSABLE) ×3 IMPLANT
GOWN STRL REUS W/TWL XL LVL3 (GOWN DISPOSABLE) ×6
GUIDEWIRE ZIPWRE .038 STRAIGHT (WIRE) ×3 IMPLANT
HOLDER FOLEY CATH W/STRAP (MISCELLANEOUS) ×1 IMPLANT
KIT TURNOVER CYSTO (KITS) ×3 IMPLANT
MANIFOLD NEPTUNE II (INSTRUMENTS) ×3 IMPLANT
NDL HYPO 18GX1.5 BLUNT FILL (NEEDLE) IMPLANT
NEEDLE HYPO 18GX1.5 BLUNT FILL (NEEDLE) IMPLANT
PACK CYSTO (CUSTOM PROCEDURE TRAY) ×3 IMPLANT
SYR 30ML LL (SYRINGE) IMPLANT
TUBE CONNECTING 12'X1/4 (SUCTIONS) ×1
TUBE CONNECTING 12X1/4 (SUCTIONS) ×2 IMPLANT
WATER STERILE IRR 3000ML UROMA (IV SOLUTION) ×3 IMPLANT

## 2018-05-10 NOTE — Discharge Instructions (Signed)
CYSTOSCOPY HOME CARE INSTRUCTIONS  Activity: Rest for the remainder of the day.  Do not drive or operate equipment today.  You may resume normal activities in one to two days as instructed by your physician.   Meals: Drink plenty of liquids and eat light foods such as gelatin or soup this evening.  You may return to a normal meal plan tomorrow.  Special Instructions / Symptoms: Call your physician if any of these symptoms occur:   -persistent or heavy bleeding  -bleeding which continues after first few urination  -large blood clots that are difficult to pass  -urine stream diminishes or stops completely  -fever equal to or higher than 101 degrees Farenheit.  -cloudy urine with a strong, foul odor  -severe pain   You may feel some burning pain when you urinate.  This should disappear with time.   Post Anesthesia Home Care Instructions  Activity: Get plenty of rest for the remainder of the day. A responsible individual must stay with you for 24 hours following the procedure.  For the next 24 hours, DO NOT: -Drive a car -Paediatric nurse -Drink alcoholic beverages -Take any medication unless instructed by your physician -Make any legal decisions or sign important papers.  Meals: Start with liquid foods such as gelatin or soup. Progress to regular foods as tolerated. Avoid greasy, spicy, heavy foods. If nausea and/or vomiting occur, drink only clear liquids until the nausea and/or vomiting subsides. Call your physician if vomiting continues.  Special Instructions/Symptoms: Your throat may feel dry or sore from the anesthesia or the breathing tube placed in your throat during surgery. If this causes discomfort, gargle with warm salt water. The discomfort should disappear within 24 hours.  If you had a scopolamine patch placed behind your ear for the management of post- operative nausea and/or vomiting:  1. The medication in the patch is effective for 72 hours, after which it should  be removed.  Wrap patch in a tissue and discard in the trash. Wash hands thoroughly with soap and water. 2. You may remove the patch earlier than 72 hours if you experience unpleasant side effects which may include dry mouth, dizziness or visual disturbances. 3. Avoid touching the patch. Wash your hands with soap and water after contact with the patch.

## 2018-05-10 NOTE — Op Note (Signed)
Operative Note  Preoperative diagnosis:  1.  Gross hematuria 2.  History of distal hypospadias  Postoperative diagnosis: 1.  Gross hematuria 2.  History of distal hypospadias  Procedure(s): 1.  Cystoscopy with exam under anesthesia  Surgeon: Ellison Hughs, MD  Assistants:  None  Anesthesia:  General  Complications:  None  EBL: Less than 5 mL  Specimens: 1.  None  Drains/Catheters: 1.  None  Intraoperative findings:   1. 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  No intravesical pathology noted  Indication:  Jacob [name] III is a 53 y.o. male with a history of distal hypospadias status post urethroplasty as a child.  He presented to the office with intermittent episodes of painless gross hematuria.  He is here today for a cystoscopy with exam under anesthesia because he was unable to have a flexible cystoscopy performed in the office due to meatal stenosis.  He has been consented for the above procedures, voices understanding and wishes to proceed.  Description of procedure:  After informed consent was obtained, the patient was brought to the operating room and MAC anesthesia was administered. The patient was then placed in the dorsolithotomy position and prepped and draped in the usual sterile fashion. A timeout was performed. A flexible ureteroscope was then inserted into the urethral meatus and advanced into the bladder under direct vision. A complete bladder survey revealed no intravesical pathology.  The flexible ureteroscope was then retracted into the urethra and a complete survey of the proximal and distal aspects of the urethra revealed no evidence of stricture disease aside from his meatal stenosis.  The patient tolerated the procedure well and was transferred to the postanesthesia unit in stable condition.  Plan:  Continue tamsulosin once daily.  F/u as OP for annual exam

## 2018-05-10 NOTE — Anesthesia Procedure Notes (Signed)
Procedure Name: MAC Date/Time: 05/10/2018 7:37 AM Performed by: Brennan Bailey, MD Pre-anesthesia Checklist: Patient identified, Emergency Drugs available, Suction available and Patient being monitored Oxygen Delivery Method: Simple face mask Airway Equipment and Method: Oral airway Placement Confirmation: positive ETCO2 and breath sounds checked- equal and bilateral

## 2018-05-10 NOTE — Anesthesia Postprocedure Evaluation (Signed)
Anesthesia Post Note  Patient: Jacob [name]  Procedure(s) Performed: CYSTOSCOPY WITH EXAM UNDER ANESTHESIA (N/A Bladder)     Patient location during evaluation: PACU Anesthesia Type: MAC Level of consciousness: awake and alert Pain management: pain level controlled Vital Signs Assessment: post-procedure vital signs reviewed and stable Respiratory status: spontaneous breathing, nonlabored ventilation and respiratory function stable Cardiovascular status: blood pressure returned to baseline and stable Postop Assessment: no apparent nausea or vomiting Anesthetic complications: no    Last Vitals:  Vitals:   05/10/18 0553 05/10/18 0805  BP: 104/72   Pulse: (!) 55 (!) 56  Resp:  14  Temp: 36.9 C 36.6 C  SpO2: 100% 100%    Last Pain:  Vitals:   05/10/18 0553  TempSrc: Oral  PainSc:                  Brennan Bailey

## 2018-05-10 NOTE — Transfer of Care (Signed)
  Last Vitals:  Vitals Value Taken Time  BP 107/67 05/10/2018  8:07 AM  Temp 36.6 C 05/10/2018  8:05 AM  Pulse 56 05/10/2018  8:11 AM  Resp 16 05/10/2018  8:11 AM  SpO2 100 % 05/10/2018  8:11 AM  Vitals shown include unvalidated device data.  Last Pain:  Vitals:   05/10/18 0553  TempSrc: Oral  PainSc:       Patients Stated Pain Goal: 6 (05/10/18 0536)  Immediate Anesthesia Transfer of Care Note  Patient: Jacob [name]  Procedure(s) Performed: Procedure(s) (LRB): CYSTOSCOPY WITH EXAM UNDER ANESTHESIA (N/A)  Patient Location: PACU  Anesthesia Type: MAC  Level of Consciousness: awake, alert  and oriented  Airway & Oxygen Therapy: Patient Spontanous Breathing and Patient connected to face mask oxygen  Post-op Assessment: Report given to PACU RN and Post -op Vital signs reviewed and stable  Post vital signs: Reviewed and stable  Complications: No apparent anesthesia complications

## 2018-05-11 ENCOUNTER — Encounter (HOSPITAL_BASED_OUTPATIENT_CLINIC_OR_DEPARTMENT_OTHER): Payer: Self-pay | Admitting: Urology

## 2018-05-27 ENCOUNTER — Ambulatory Visit: Payer: 59 | Admitting: Psychiatry

## 2018-05-27 ENCOUNTER — Ambulatory Visit: Payer: 59 | Admitting: Mental Health

## 2018-05-31 ENCOUNTER — Ambulatory Visit: Payer: 59 | Admitting: Psychiatry

## 2018-05-31 ENCOUNTER — Encounter: Payer: Self-pay | Admitting: Psychiatry

## 2018-05-31 VITALS — BP 139/87 | HR 59

## 2018-05-31 DIAGNOSIS — F422 Mixed obsessional thoughts and acts: Secondary | ICD-10-CM | POA: Diagnosis not present

## 2018-05-31 DIAGNOSIS — F411 Generalized anxiety disorder: Secondary | ICD-10-CM

## 2018-05-31 DIAGNOSIS — F410 Panic disorder [episodic paroxysmal anxiety] without agoraphobia: Secondary | ICD-10-CM | POA: Diagnosis not present

## 2018-05-31 MED ORDER — FLUOXETINE HCL 40 MG PO CAPS: 40.0000 mg | ORAL_CAPSULE | Freq: Every evening | ORAL | 0 refills | Status: DC

## 2018-05-31 NOTE — Progress Notes (Signed)
Jacob [name] [id number] [birth year] 53 y.o.  Subjective:   Patient ID:  Jacob [name] is a 53 y.o. (DOB 01-Jun-1965) male.  Chief Complaint:  Chief Complaint  Patient presents with  . Follow-up    Anxiety    HPI Jacob [name] presents to the office today for follow-up of anxiety. "I haven't had hardly any anxiety attacks since I have been on the Prozac." He reports that he continues to notice some worry. "I have to worry about something, I guess." He reports that worry is less than it was in the past. Reports that he has been trying to grow spiritually.   Denies depressed mood. He reports that he has been staying tired on the weekends when he would rather be active. He reports that he is not as tired as he was after initially starting Prozac. He reports that Prozac seems to cause sleepiness and he takes it at night. He reports anxiety is better controlled on Prozac 40 mg po qd. He reports that he noticed some worsening anxiety when he tried to decrease Prozac. He reports that his motivation is good and he pushes himself to complete things. Reports adequate sleep. Reports that he had period of not wanting to eat. Reports that his appetite has improved but he has not been craving certain foods that he used to enjoy. Denies SI.   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 He reports that he has difficulty recalling names. Reports that he often needs a reminder and then can recall what he had forgotten. He reports some anxiety about having early-onset dementia. He denies any other health-related anxiety. Reports that he continues to avoid splashing water near his nose due to fear of "brain eating amoeba" after knowing someone that died from it.   Psychiatric Medication Trials: Sertraline- mild tremor. Affective dulling Prozac. Xanax- Has taken since he was 36 or 52 yo. Ambien- parasomnia   Review of Systems:  Review of Systems   Genitourinary: Positive for difficulty urinating and hematuria.  Musculoskeletal: Negative for gait problem.  Neurological: Positive for light-headedness. Negative for tremors.       Reports that he has a tingling or light-headed feeling around his forehead, particularly if something is around his head.   Psychiatric/Behavioral:       Please refer to HPI    Medications: I have reviewed the patient's current medications.  Current Outpatient Medications  Medication Sig Dispense Refill  . ALPRAZolam (XANAX) 0.5 MG tablet Take 1-2 tablets (0.5-1 mg total) by mouth at bedtime as needed. 60 tablet 5  . aspirin EC 81 MG tablet Take 1 tablet (81 mg total) by mouth daily. 90 tablet 11  . FLUoxetine (PROZAC) 40 MG capsule Take 1 capsule (40 mg total) by mouth every evening. 90 capsule 0  . metFORMIN (GLUCOPHAGE-XR) 500 MG 24 hr tablet Take 3 tablets (1,500 mg total) by mouth daily with breakfast. 270 tablet 3  . Multiple Vitamin (MULTIVITAMIN WITH MINERALS) TABS tablet Take 1 tablet by mouth daily.    . Omega-3 Fatty Acids (FISH OIL PO) Take 1 capsule by mouth daily.    . pantoprazole (PROTONIX) 40 MG tablet Take 1 tablet (40 mg total) by mouth daily. (Patient taking differently: Take 40 mg by mouth every morning. ) 90 tablet 3  . ramipril (ALTACE) 10 MG capsule Take 1 capsule (10 mg total) by mouth daily. (Patient taking differently: Take 10 mg by  mouth every morning. ) 90 capsule 3  . rosuvastatin (CRESTOR) 10 MG tablet Take 1 tablet (10 mg total) by mouth daily. (Patient taking differently: Take 10 mg by mouth every morning. ) 90 tablet 3  . tamsulosin (FLOMAX) 0.4 MG CAPS capsule Take 0.4 mg by mouth.    . ciprofloxacin (CIPRO) 500 MG tablet Take 500 mg by mouth 2 (two) times daily.    . naproxen (NAPROSYN) 500 MG tablet Take 500 mg by mouth as needed.   1  . tadalafil (CIALIS) 20 MG tablet Take 1 tablet (20 mg total) by mouth daily as needed for erectile dysfunction. 10 tablet 11  .  triamcinolone (NASACORT) 55 MCG/ACT AERO nasal inhaler Place 2 sprays into the nose daily. (Patient not taking: Reported on 05/31/2018) 1 Inhaler 12   No current facility-administered medications for this visit.     Medication Side Effects: Other: Drowsiness  Allergies: No Known Allergies  Past Medical History:  Diagnosis Date  . Allergic rhinitis   . Anxiety   . Arthritis   . Cholesteatoma of right ear   . 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 Dysuria   . Enlarged prostate    pt unaware  . GERD (gastroesophageal reflux disease)   . Headache   . Hearing loss    Right ear  . Hernia of abdominal wall   . History of gross hematuria   . History of head injury   . History of kidney stones   . History of palpitations   . Hyperlipidemia 10/18/2014  . Hypertension   . Insomnia   . Lack of concentration   . Nasal fracture    Several  . Otitis media 11/10/2017   Bilateral  . Panic attacks   . Syncope   . Vertigo     Family History  Problem Relation Age of Onset  . COPD Mother   . Hypertension Mother   . Anxiety disorder Mother   . Heart disease Father   . Cancer Father   . Diabetes Paternal Grandmother   . Diabetes Maternal Grandmother   . Anxiety disorder Maternal Grandmother   . Heart attack Brother   . Lung cancer Maternal Uncle   . Anxiety disorder Maternal Uncle   . Anxiety disorder Maternal Aunt   . OCD Maternal Aunt   . Anxiety disorder Cousin   . ADD / ADHD Son   . Drug abuse Son   . Colon cancer Neg Hx   . Esophageal cancer Neg Hx   . Rectal cancer Neg Hx   . Stomach cancer Neg Hx     Social History   Socioeconomic History  . Marital status: Single    Spouse name: Not on file  . Number of children: 3  . Years of education: Not on file  . Highest education level: Not on file  Occupational History  . Occupation: Music therapist: [employer]  . Financial resource strain: Not on file  . Food insecurity:    Worry: Not on  file    Inability: Not on file  . Transportation needs:    Medical: Not on file    Non-medical: Not on file  Tobacco Use  . Smoking status: Never Smoker  . Smokeless tobacco: Never Used  Substance and Sexual Activity  . Alcohol use: Yes    Alcohol/week: 5.0 standard drinks    Types: 5 Glasses of wine per week    Comment: weekends  .  Drug use: No  . Sexual activity: Yes    Partners: Female    Birth control/protection: None  Lifestyle  . Physical activity:    Days per week: Not on file    Minutes per session: Not on file  . Stress: Not on file  Relationships  . Social connections:    Talks on phone: Not on file    Gets together: Not on file    Attends religious service: Not on file    Active member of club or organization: Not on file    Attends meetings of clubs or organizations: Not on file    Relationship status: Not on file  . Intimate partner violence:    Fear of current or ex partner: Not on file    Emotionally abused: Not on file    Physically abused: Not on file    Forced sexual activity: Not on file  Other Topics Concern  . Not on file  Social History Narrative  . Not on file    Past Medical History, Surgical history, Social history, and Family history were reviewed and updated as appropriate.   Please see review of systems for further details on the patient's review from today.   Objective:   Physical Exam:  BP 139/87   Pulse (!) 59   Physical Exam Constitutional:      General: He is not in acute distress.    Appearance: He is well-developed.  Musculoskeletal:        General: No deformity.  Neurological:     Mental Status: He is alert and oriented to person, place, and time.     Coordination: Coordination normal.  Psychiatric:        Attention and Perception: Attention and perception normal. He does not perceive auditory or visual hallucinations.        Mood and Affect: Mood normal. Mood is not anxious or depressed. Affect is not labile, blunt,  angry or inappropriate.        Speech: Speech normal.        Behavior: Behavior normal.        Thought Content: Thought content normal. Thought content does not include homicidal or suicidal ideation. Thought content does not include homicidal or suicidal plan.        Cognition and Memory: Cognition and memory normal.        Judgment: Judgment normal.     Comments: Insight intact. No delusions.      Lab Review:     Component Value Date/Time   NA 137 05/10/2018 0545   K 4.5 05/10/2018 0545   CL 103 05/10/2018 0545   CO2 26 05/10/2018 0545   GLUCOSE 147 (H) 05/10/2018 0545   BUN 15 05/10/2018 0545   CREATININE 1.11 05/10/2018 0545   CREATININE 1.37 (H) 10/07/2014 1202   CALCIUM 9.1 05/10/2018 0545   PROT 7.1 12/11/2017 0958   ALBUMIN 4.0 12/11/2017 0958   AST 20 12/11/2017 0958   ALT 23 12/11/2017 0958   ALKPHOS 60 12/11/2017 0958   BILITOT 1.0 12/11/2017 0958   GFRNONAA >60 05/10/2018 0545   GFRNONAA 61 10/07/2014 1202   GFRAA >60 05/10/2018 0545   GFRAA 70 10/07/2014 1202       Component Value Date/Time   WBC 5.8 04/22/2018 1546   RBC 5.18 04/22/2018 1546   HGB 15.4 04/22/2018 1546   HCT 44.5 04/22/2018 1546   PLT 282.0 04/22/2018 1546   MCV 85.9 04/22/2018 1546   MCH 29.9 12/11/2017 9470  MCHC 34.5 04/22/2018 1546   RDW 13.1 04/22/2018 1546   LYMPHSABS 1.8 04/22/2018 1546   MONOABS 0.3 04/22/2018 1546   EOSABS 0.1 04/22/2018 1546   BASOSABS 0.0 04/22/2018 1546    No results found for: POCLITH, LITHIUM   No results found for: PHENYTOIN, PHENOBARB, VALPROATE, CBMZ   .res Assessment: Plan:   Provided patient with reassurance that MMSE was 30 out of 30 which indicates no sign of cognitive impairment.  Patient reports that benefits of Prozac 40 mg daily outweigh possible side effects at this time and he would like to continue Prozac 40 mg daily for anxiety.  Recommend continuing Xanax as needed for anxiety and patient reports that PCP has been prescribing  Xanax. Continue therapy with [name], LPC. Patient advised to contact office with any questions, adverse effects, or acute worsening in signs and symptoms.  Generalized anxiety disorder - Plan: FLUoxetine (PROZAC) 40 MG capsule  Mixed obsessional thoughts and acts - Plan: FLUoxetine (PROZAC) 40 MG capsule  Panic disorder - Plan: FLUoxetine (PROZAC) 40 MG capsule  Please see After Visit Summary for patient specific instructions.  Future Appointments  Date Time Provider [place]  06/06/2018  5:00 PM [name], [facility] CP-CP None    No orders of the defined types were placed in this encounter.     -------------------------------

## 2018-06-06 ENCOUNTER — Encounter: Payer: Self-pay | Admitting: Mental Health

## 2018-06-06 ENCOUNTER — Ambulatory Visit (INDEPENDENT_AMBULATORY_CARE_PROVIDER_SITE_OTHER): Payer: 59 | Admitting: Mental Health

## 2018-06-06 DIAGNOSIS — F411 Generalized anxiety disorder: Secondary | ICD-10-CM

## 2018-06-06 NOTE — Progress Notes (Signed)
      [facility] Counselor/Therapist Progress Note  Patient ID: YVETTE ROARK, MRN: [id number],    Date: 06/06/2018  Time Spent: 45 minutes   Treatment Type: Individual Therapy  Reported Symptoms: Regretful and sad about son. Son is selling drugs. Anxiety issues. Has issues with current girlfriend over his children.  Mental Status Exam:  Appearance:   Casual     Behavior:  Sharing and Agitated  Motor:  Normal  Speech/Language:   Normal Rate  Affect:  Depressed and Full Range  Mood:  anxious and sad  Thought process:  normal  Thought content:    WNL  Sensory/Perceptual disturbances:    WNL  Orientation:  oriented to person, place and time/date  Attention:  Fair  Concentration:  Fair  Memory:  [place] of knowledge:   Good  Insight:    Good  Judgment:   Fair  Impulse Control:  Fair   Risk Assessment: Danger to Self:  No Self-injurious Behavior: No Danger to Others: No Duty to Warn:no Physical Aggression / Violence:No  Access to Firearms a concern: No  Gang Involvement:No   Subjective:    Interventions: Cognitive Behavioral Therapy, Solution-Oriented/Positive Psychology, Insight-Oriented, Interpersonal and supportive  Diagnosis:   ICD-10-CM   1. Generalized anxiety disorder F41.1     Plan:  Self care program            Positive Self talk             Decrease anxiety symptoms             Validation and support  [name], [facility]

## 2018-06-27 ENCOUNTER — Ambulatory Visit: Payer: 59 | Admitting: Psychiatry

## 2018-06-29 ENCOUNTER — Other Ambulatory Visit: Payer: Self-pay | Admitting: Internal Medicine

## 2018-06-29 DIAGNOSIS — E119 Type 2 diabetes mellitus without complications: Secondary | ICD-10-CM

## 2018-06-30 ENCOUNTER — Other Ambulatory Visit: Payer: Self-pay | Admitting: Internal Medicine

## 2018-07-25 ENCOUNTER — Other Ambulatory Visit: Payer: Self-pay | Admitting: Psychiatry

## 2018-07-25 DIAGNOSIS — F410 Panic disorder [episodic paroxysmal anxiety] without agoraphobia: Secondary | ICD-10-CM

## 2018-07-25 DIAGNOSIS — F422 Mixed obsessional thoughts and acts: Secondary | ICD-10-CM

## 2018-07-25 DIAGNOSIS — F411 Generalized anxiety disorder: Secondary | ICD-10-CM

## 2018-08-08 ENCOUNTER — Encounter: Payer: Self-pay | Admitting: Internal Medicine

## 2018-08-08 ENCOUNTER — Ambulatory Visit (INDEPENDENT_AMBULATORY_CARE_PROVIDER_SITE_OTHER): Payer: 59 | Admitting: Internal Medicine

## 2018-08-08 ENCOUNTER — Other Ambulatory Visit: Payer: Self-pay

## 2018-08-08 VITALS — BP 130/70 | HR 66 | Temp 98.5°F | Ht 72.0 in | Wt 184.0 lb

## 2018-08-08 DIAGNOSIS — J309 Allergic rhinitis, unspecified: Secondary | ICD-10-CM

## 2018-08-08 DIAGNOSIS — R413 Other amnesia: Secondary | ICD-10-CM

## 2018-08-08 DIAGNOSIS — Z0001 Encounter for general adult medical examination with abnormal findings: Secondary | ICD-10-CM

## 2018-08-08 DIAGNOSIS — Z0000 Encounter for general adult medical examination without abnormal findings: Secondary | ICD-10-CM | POA: Diagnosis not present

## 2018-08-08 DIAGNOSIS — F418 Other specified anxiety disorders: Secondary | ICD-10-CM

## 2018-08-08 MED ORDER — TRIAMCINOLONE ACETONIDE 55 MCG/ACT NA AERO: 2.0000 | INHALATION_SPRAY | Freq: Every day | NASAL | 12 refills | Status: DC

## 2018-08-08 NOTE — Progress Notes (Deleted)
Patient ID: Jacob [name], male   DOB: Aug 11, 1965, 53 y.o.   MRN: [id number]

## 2018-08-08 NOTE — Progress Notes (Signed)
Subjective:    Patient ID: Jacob [name], male    DOB: [birth year], 53 y.o.   MRN: [id number]  HPI  Here for wellness and f/u;  Overall doing ok;  Pt denies Chest pain, worsening SOB, DOE, wheezing, orthopnea, PND, worsening LE edema, palpitations, dizziness or syncope.  Pt denies neurological change such as new headache, facial or extremity weakness.  Pt denies polydipsia, polyuria, or low sugar symptoms. Pt states overall good compliance with treatment and medications, good tolerability, and has been trying to follow appropriate diet.  Pt denies worsening depressive symptoms, suicidal ideation or panic. No fever, night sweats, wt loss, loss of appetite, or other constitutional symptoms.  Pt states good ability with ADL's, has low fall risk, home safety reviewed and adequate, no other significant changes in hearing or vision, and only occasionally active with exercise. Also c/o worsening memory with increased forgetfullness and sometimes cant remember tasks or what he is doing, even got lost going home once.  Does have several wks ongoing nasal allergy symptoms with clearish congestion, itch and sneezing, without fever, pain, ST, cough, swelling or wheezing. Past Medical History:  Diagnosis Date  . Allergic rhinitis   . Anxiety   . Arthritis   . Cholesteatoma of right ear   . 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 Dysuria   . Enlarged prostate    pt unaware  . GERD (gastroesophageal reflux disease)   . Headache   . Hearing loss    Right ear  . Hernia of abdominal wall   . History of gross hematuria   . History of head injury   . History of kidney stones   . History of palpitations   . Hyperlipidemia 10/18/2014  . Hypertension   . Insomnia   . Lack of concentration   . Nasal fracture    Several  . Otitis media 11/10/2017   Bilateral  . Panic attacks   . Syncope   . Vertigo    Past Surgical History:  Procedure Laterality Date  . CHOLECYSTECTOMY    . CYSTOSCOPY N/A  05/10/2018   Procedure: CYSTOSCOPY WITH EXAM UNDER ANESTHESIA;  Surgeon: Ceasar Mons, MD;  Location: [facility];  Service: Urology;  Laterality: N/A;  ONLY NEEDS 30 MIN  . [place]  . INNER EAR SURGERY     right  . LUMBAR DISC SURGERY    . MASTOIDECTOMY Right   . NOSE SURGERY     Fracture  . tubes in bil ears    . UPPER GI ENDOSCOPY  01/09/2012   Esophageal dilation    reports that he has never smoked. He has never used smokeless tobacco. He reports current alcohol use of about 5.0 standard drinks of alcohol per week. He reports that he does not use drugs. family history includes ADD / ADHD in his son; Anxiety disorder in his cousin, maternal aunt, maternal grandmother, maternal uncle, and mother; COPD in his mother; Cancer in his father; Diabetes in his maternal grandmother and paternal grandmother; Drug abuse in his son; Heart attack in his brother; Heart disease in his father; Hypertension in his mother; Lung cancer in his maternal uncle; OCD in his maternal aunt. No Known Allergies Current Outpatient Medications on File Prior to Visit  Medication Sig Dispense Refill  . ALPRAZolam (XANAX) 0.5 MG tablet Take 1-2 tablets (0.5-1 mg total) by mouth at bedtime as needed. 60 tablet 5  . aspirin EC 81 MG tablet Take  1 tablet (81 mg total) by mouth daily. 90 tablet 11  . metFORMIN (GLUCOPHAGE-XR) 500 MG 24 hr tablet TAKE 3 TABLETS BY MOUTH ONCE DAILY WITH BREAKFAST 270 tablet 0  . Multiple Vitamin (MULTIVITAMIN WITH MINERALS) TABS tablet Take 1 tablet by mouth daily.    . naproxen (NAPROSYN) 500 MG tablet Take 500 mg by mouth as needed.   1  . Omega-3 Fatty Acids (FISH OIL PO) Take 1 capsule by mouth daily.    . pantoprazole (PROTONIX) 40 MG tablet Take 1 tablet (40 mg total) by mouth daily. (Patient taking differently: Take 40 mg by mouth every morning. ) 90 tablet 3  . ramipril (ALTACE) 10 MG capsule TAKE 1 CAPSULE BY MOUTH EVERY DAY 90  capsule 0  . rosuvastatin (CRESTOR) 10 MG tablet Take 1 tablet (10 mg total) by mouth daily. (Patient taking differently: Take 10 mg by mouth every morning. ) 90 tablet 3  . tamsulosin (FLOMAX) 0.4 MG CAPS capsule Take 0.4 mg by mouth.    . tadalafil (CIALIS) 20 MG tablet Take 1 tablet (20 mg total) by mouth daily as needed for erectile dysfunction. 10 tablet 11   No current facility-administered medications on file prior to visit.    Review of Systems Constitutional: Negative for other unusual diaphoresis, sweats, appetite or weight changes HENT: Negative for other worsening hearing loss, ear pain, facial swelling, mouth sores or neck stiffness.   Eyes: Negative for other worsening pain, redness or other visual disturbance.  Respiratory: Negative for other stridor or swelling Cardiovascular: Negative for other palpitations or other chest pain  Gastrointestinal: Negative for worsening diarrhea or loose stools, blood in stool, distention or other pain Genitourinary: Negative for hematuria, flank pain or other change in urine volume.  Musculoskeletal: Negative for myalgias or other joint swelling.  Skin: Negative for other color change, or other wound or worsening drainage.  Neurological: Negative for other syncope or numbness. Hematological: Negative for other adenopathy or swelling Psychiatric/Behavioral: Negative for hallucinations, other worsening agitation, SI, self-injury, or new decreased concentration All other system neg per pt    Objective:   Physical Exam BP 130/70 (BP Location: Left Arm, Patient Position: Sitting, Cuff Size: Normal)   Pulse 66   Temp 98.5 F (36.9 C) (Oral)   Ht 6' (1.829 m)   Wt 184 lb (83.5 kg)   SpO2 98%   BMI 24.95 kg/m  VS noted,  Constitutional: Pt is oriented to person, place, and time. Appears well-developed and well-nourished, in no significant distress and comfortable Head: Normocephalic and atraumatic  Eyes: Conjunctivae and EOM are normal.  Pupils are equal, round, and reactive to light Right Ear: External ear normal without discharge Left Ear: External ear normal without discharge Nose: Nose without discharge or deformity Bilat tm's with mild erythema.  Max sinus areas non tender.  Pharynx with mild erythema, no exudate Mouth/Throat: Oropharynx is without other ulcerations and moist  Neck: Normal range of motion. Neck supple. No JVD present. No tracheal deviation present or significant neck LA or mass Cardiovascular: Normal rate, regular rhythm, normal heart sounds and intact distal pulses.   Pulmonary/Chest: WOB normal and breath sounds without rales or wheezing  Abdominal: Soft. Bowel sounds are normal. NT. No HSM  Musculoskeletal: Normal range of motion. Exhibits no edema Lymphadenopathy: Has no other cervical adenopathy.  Neurological: Pt is alert and oriented to person, place, and time. Pt has normal reflexes. No cranial nerve deficit. Motor grossly intact, Gait intact Skin: Skin is warm and  dry. No rash noted or new ulcerations Psychiatric:  Has normal mood and affect. Behavior is normal without agitation, 2+ nervous No other exam findings Lab Results  Component Value Date   WBC 5.8 04/22/2018   HGB 15.4 04/22/2018   HCT 44.5 04/22/2018   PLT 282.0 04/22/2018   GLUCOSE 147 (H) 05/10/2018   CHOL 177 10/08/2017   TRIG 57.0 10/08/2017   HDL 57.60 10/08/2017   LDLDIRECT 102.0 11/23/2016   LDLCALC 108 (H) 10/08/2017   ALT 23 12/11/2017   AST 20 12/11/2017   NA 137 05/10/2018   K 4.5 05/10/2018   CL 103 05/10/2018   CREATININE 1.11 05/10/2018   BUN 15 05/10/2018   CO2 26 05/10/2018   TSH 1.19 10/08/2017   PSA 1.05 10/08/2017   INR 0.96 12/11/2017   HGBA1C 6.4 10/08/2017   MICROALBUR <0.7 10/08/2017       Assessment & Plan:

## 2018-08-10 ENCOUNTER — Other Ambulatory Visit (INDEPENDENT_AMBULATORY_CARE_PROVIDER_SITE_OTHER): Payer: 59

## 2018-08-10 DIAGNOSIS — R413 Other amnesia: Secondary | ICD-10-CM | POA: Diagnosis not present

## 2018-08-10 DIAGNOSIS — Z0000 Encounter for general adult medical examination without abnormal findings: Secondary | ICD-10-CM

## 2018-08-10 DIAGNOSIS — R739 Hyperglycemia, unspecified: Secondary | ICD-10-CM | POA: Diagnosis not present

## 2018-08-10 LAB — URINALYSIS, ROUTINE W REFLEX MICROSCOPIC
Bilirubin Urine: NEGATIVE
Hgb urine dipstick: NEGATIVE
Ketones, ur: NEGATIVE
Leukocytes,Ua: NEGATIVE
Nitrite: NEGATIVE
RBC / HPF: NONE SEEN (ref 0–?)
Specific Gravity, Urine: 1.025 (ref 1.000–1.030)
Total Protein, Urine: NEGATIVE
Urine Glucose: NEGATIVE
Urobilinogen, UA: 0.2 (ref 0.0–1.0)
WBC, UA: NONE SEEN (ref 0–?)
pH: 6 (ref 5.0–8.0)

## 2018-08-10 LAB — CBC WITH DIFFERENTIAL/PLATELET
Basophils Absolute: 0 10*3/uL (ref 0.0–0.1)
Basophils Relative: 0.5 % (ref 0.0–3.0)
Eosinophils Absolute: 0.1 10*3/uL (ref 0.0–0.7)
Eosinophils Relative: 1.8 % (ref 0.0–5.0)
HCT: 46.4 % (ref 39.0–52.0)
Hemoglobin: 16 g/dL (ref 13.0–17.0)
Lymphocytes Relative: 27.7 % (ref 12.0–46.0)
Lymphs Abs: 1.2 10*3/uL (ref 0.7–4.0)
MCHC: 34.5 g/dL (ref 30.0–36.0)
MCV: 87.8 fl (ref 78.0–100.0)
Monocytes Absolute: 0.3 10*3/uL (ref 0.1–1.0)
Monocytes Relative: 6.7 % (ref 3.0–12.0)
Neutro Abs: 2.8 10*3/uL (ref 1.4–7.7)
Neutrophils Relative %: 63.3 % (ref 43.0–77.0)
Platelets: 215 10*3/uL (ref 150.0–400.0)
RBC: 5.28 Mil/uL (ref 4.22–5.81)
RDW: 13.8 % (ref 11.5–15.5)
WBC: 4.4 10*3/uL (ref 4.0–10.5)

## 2018-08-10 LAB — HEPATIC FUNCTION PANEL
ALT: 24 U/L (ref 0–53)
AST: 21 U/L (ref 0–37)
Albumin: 4.5 g/dL (ref 3.5–5.2)
Alkaline Phosphatase: 65 U/L (ref 39–117)
Bilirubin, Direct: 0.1 mg/dL (ref 0.0–0.3)
Total Bilirubin: 0.6 mg/dL (ref 0.2–1.2)
Total Protein: 7.1 g/dL (ref 6.0–8.3)

## 2018-08-10 LAB — TSH: TSH: 1.19 u[IU]/mL (ref 0.35–4.50)

## 2018-08-10 LAB — LIPID PANEL
Cholesterol: 120 mg/dL (ref 0–200)
HDL: 57.9 mg/dL (ref >39.00)
LDL Cholesterol: 47 mg/dL (ref 0–99)
NonHDL: 62.48
Total CHOL/HDL Ratio: 2
Triglycerides: 79 mg/dL (ref 0.0–149.0)
VLDL: 15.8 mg/dL (ref 0.0–40.0)

## 2018-08-10 LAB — PSA: PSA: 1.07 ng/mL (ref 0.10–4.00)

## 2018-08-10 LAB — BASIC METABOLIC PANEL
BUN: 12 mg/dL (ref 6–23)
CO2: 27 mEq/L (ref 19–32)
Calcium: 9.2 mg/dL (ref 8.4–10.5)
Chloride: 104 mEq/L (ref 96–112)
Creatinine, Ser: 1.07 mg/dL (ref 0.40–1.50)
GFR: 72.75 mL/min (ref >60.00)
Glucose, Bld: 147 mg/dL — ABNORMAL HIGH (ref 70–99)
Potassium: 4.5 mEq/L (ref 3.5–5.1)
Sodium: 139 mEq/L (ref 135–145)

## 2018-08-10 LAB — VITAMIN B12: Vitamin B-12: 374 pg/mL (ref 211–911)

## 2018-08-11 ENCOUNTER — Other Ambulatory Visit: Payer: Self-pay

## 2018-08-11 ENCOUNTER — Encounter: Payer: Self-pay | Admitting: Internal Medicine

## 2018-08-11 ENCOUNTER — Ambulatory Visit (INDEPENDENT_AMBULATORY_CARE_PROVIDER_SITE_OTHER): Payer: 59 | Admitting: Psychiatry

## 2018-08-11 ENCOUNTER — Ambulatory Visit (INDEPENDENT_AMBULATORY_CARE_PROVIDER_SITE_OTHER): Payer: 59 | Admitting: Internal Medicine

## 2018-08-11 DIAGNOSIS — G4710 Hypersomnia, unspecified: Secondary | ICD-10-CM

## 2018-08-11 DIAGNOSIS — I10 Essential (primary) hypertension: Secondary | ICD-10-CM | POA: Diagnosis not present

## 2018-08-11 DIAGNOSIS — F422 Mixed obsessional thoughts and acts: Secondary | ICD-10-CM

## 2018-08-11 DIAGNOSIS — R413 Other amnesia: Secondary | ICD-10-CM | POA: Insufficient documentation

## 2018-08-11 DIAGNOSIS — F411 Generalized anxiety disorder: Secondary | ICD-10-CM | POA: Diagnosis not present

## 2018-08-11 DIAGNOSIS — F410 Panic disorder [episodic paroxysmal anxiety] without agoraphobia: Secondary | ICD-10-CM

## 2018-08-11 LAB — HEMOGLOBIN A1C: Hgb A1c MFr Bld: 6.8 % — ABNORMAL HIGH (ref 4.6–6.5)

## 2018-08-11 MED ORDER — FLUOXETINE HCL 40 MG PO CAPS: 40.0000 mg | ORAL_CAPSULE | Freq: Every day | ORAL | 0 refills | Status: DC

## 2018-08-11 NOTE — Progress Notes (Signed)
Jacob [name] [id number] Nov 02, 1965 53 y.o.  Virtual Visit via Telephone Note  I connected with@ on 08/11/18 at 10:30 AM EDT by telephone and verified that I am speaking with the correct person using two identifiers.   I discussed the limitations, risks, security and privacy concerns of performing an evaluation and management service by telephone and the availability of in person appointments. I also discussed with the patient that there may be a patient responsible charge related to this service. The patient expressed understanding and agreed to proceed.   I discussed the assessment and treatment plan with the patient. The patient was provided an opportunity to ask questions and all were answered. The patient agreed with the plan and demonstrated an understanding of the instructions.   The patient was advised to call back or seek an in-person evaluation if the symptoms worsen or if the condition fails to improve as anticipated.  I provided 30 minutes of non-face-to-face time during this encounter.  The patient was located at home.  The provider was located at home.   Thayer Headings, PMHNP   Subjective:   Patient ID:  Jacob [name] is a 53 y.o. (DOB 06/13/1965) male.  Chief Complaint:  Chief Complaint  Patient presents with  . Anxiety    HPI Jacob [name] presents for follow-up of anxiety. He reports that his anxiety "wwas almost non-existent since I have been taking Prozac." He reports that he has been feeling "overwhelmed" with pandemic, hours being reduced, possibly being laid off, and may be taking another job. Reports that he is anxious about starting a new job and "starting over." He has also been in the process of buying a new condo.   He reports that he is concerned about possible memory loss. Reports that he has been having more anxious thoughts with working less and having more time to think. He reports that at times he has difficulty remembering names from the past. He  reports that he is occasionally slurring words. Reports that he notices his "ADD seems to be worse." He reports some rumination and will obsess about a certain thing, such as thinking that he has a health problem and will be fixated on this until he is re-assured he does not have it. Reports that he had a yearly physical yesterday and labs were WNL. Denies any recent panic attacks. "If this pandemic would have happened last week, I would have been losing my mind." He reports that his faith has been getting stronger. He denies depressed mood. He reports that sleep is adequate. He reports feeling tired at times. Reports that his motivation has been good. Denies SI.   Reports that PCP recommended sleep study yesterday and his girlfriend has reported that he snores. Reports taking Alprazolam nightly.   Psychiatric Medication Trials: Sertraline- mild tremor. Affective dulling Prozac. Xanax- Has taken since he was 53 or 53 yo. Ambien- parasomnia  Review of Systems:  Review of Systems  Musculoskeletal: Negative for gait problem.       Reports chronic shoulder and knee pain  Allergic/Immunologic: Positive for environmental allergies.  Neurological: Negative for tremors.  Psychiatric/Behavioral:       Please refer to HPI    Medications: I have reviewed the patient's current medications.  Current Outpatient Medications  Medication Sig Dispense Refill  . aspirin EC 81 MG tablet Take 1 tablet (81 mg total) by mouth daily. 90 tablet 11  . FLUoxetine (PROZAC) 40 MG capsule Take 1 capsule (40  mg total) by mouth daily. 90 capsule 0  . metFORMIN (GLUCOPHAGE-XR) 500 MG 24 hr tablet TAKE 3 TABLETS BY MOUTH ONCE DAILY WITH BREAKFAST 270 tablet 0  . Multiple Vitamin (MULTIVITAMIN WITH MINERALS) TABS tablet Take 1 tablet by mouth daily.    . naproxen (NAPROSYN) 500 MG tablet Take 500 mg by mouth as needed.   1  . Omega-3 Fatty Acids (FISH OIL PO) Take 1 capsule by mouth daily.    . pantoprazole (PROTONIX) 40  MG tablet Take 1 tablet (40 mg total) by mouth daily. (Patient taking differently: Take 40 mg by mouth every morning. ) 90 tablet 3  . ramipril (ALTACE) 10 MG capsule TAKE 1 CAPSULE BY MOUTH EVERY DAY 90 capsule 0  . rosuvastatin (CRESTOR) 10 MG tablet Take 1 tablet (10 mg total) by mouth daily. (Patient taking differently: Take 10 mg by mouth every morning. ) 90 tablet 3  . tamsulosin (FLOMAX) 0.4 MG CAPS capsule Take 0.4 mg by mouth.    . triamcinolone (NASACORT) 55 MCG/ACT AERO nasal inhaler Place 2 sprays into the nose daily. 1 Inhaler 12  . ALPRAZolam (XANAX) 0.5 MG tablet Take 1-2 tablets (0.5-1 mg total) by mouth at bedtime as needed. 60 tablet 5  . tadalafil (CIALIS) 20 MG tablet Take 1 tablet (20 mg total) by mouth daily as needed for erectile dysfunction. 10 tablet 11   No current facility-administered medications for this visit.     Medication Side Effects: None  Allergies: No Known Allergies  Past Medical History:  Diagnosis Date  . Allergic rhinitis   . Anxiety   . Arthritis   . Cholesteatoma of right ear   . 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 Dysuria   . Enlarged prostate    pt unaware  . GERD (gastroesophageal reflux disease)   . Headache   . Hearing loss    Right ear  . Hernia of abdominal wall   . History of gross hematuria   . History of head injury   . History of kidney stones   . History of palpitations   . Hyperlipidemia 10/18/2014  . Hypertension   . Insomnia   . Lack of concentration   . Nasal fracture    Several  . Otitis media 11/10/2017   Bilateral  . Panic attacks   . Syncope   . Vertigo     Family History  Problem Relation Age of Onset  . COPD Mother   . Hypertension Mother   . Anxiety disorder Mother   . Heart disease Father   . Cancer Father   . Diabetes Paternal Grandmother   . Diabetes Maternal Grandmother   . Anxiety disorder Maternal Grandmother   . Heart attack Brother   . Lung cancer Maternal Uncle   . Anxiety  disorder Maternal Uncle   . Anxiety disorder Maternal Aunt   . OCD Maternal Aunt   . Anxiety disorder Cousin   . ADD / ADHD Son   . Drug abuse Son   . Colon cancer Neg Hx   . Esophageal cancer Neg Hx   . Rectal cancer Neg Hx   . Stomach cancer Neg Hx     Social History   Socioeconomic History  . Marital status: Single    Spouse name: Not on file  . Number of children: 3  . Years of education: Not on file  . Highest education level: Not on file  Occupational History  . Occupation: Dealer  Employer: [employer]  Social Needs  . Financial resource strain: Not on file  . Food insecurity:    Worry: Not on file    Inability: Not on file  . Transportation needs:    Medical: Not on file    Non-medical: Not on file  Tobacco Use  . Smoking status: Never Smoker  . Smokeless tobacco: Never Used  Substance and Sexual Activity  . Alcohol use: Yes    Alcohol/week: 5.0 standard drinks    Types: 5 Glasses of wine per week    Comment: weekends  . Drug use: No  . Sexual activity: Yes    Partners: Female    Birth control/protection: None  Lifestyle  . Physical activity:    Days per week: Not on file    Minutes per session: Not on file  . Stress: Not on file  Relationships  . Social connections:    Talks on phone: Not on file    Gets together: Not on file    Attends religious service: Not on file    Active member of club or organization: Not on file    Attends meetings of clubs or organizations: Not on file    Relationship status: Not on file  . Intimate partner violence:    Fear of current or ex partner: Not on file    Emotionally abused: Not on file    Physically abused: Not on file    Forced sexual activity: Not on file  Other Topics Concern  . Not on file  Social History Narrative  . Not on file    Past Medical History, Surgical history, Social history, and Family history were reviewed and updated as appropriate.   Please see review of systems for further  details on the patient's review from today.   Objective:   Physical Exam:  There were no vitals taken for this visit.  Physical Exam Neurological:     Mental Status: He is alert and oriented to person, place, and time.     Cranial Nerves: No dysarthria.  Psychiatric:        Attention and Perception: Attention normal.        Mood and Affect: Mood is anxious.        Speech: Speech normal.        Behavior: Behavior is cooperative.        Thought Content: Thought content normal. Thought content is not paranoid or delusional. Thought content does not include homicidal or suicidal ideation. Thought content does not include homicidal or suicidal plan.        Cognition and Memory: Cognition and memory normal.        Judgment: Judgment normal.     Lab Review:     Component Value Date/Time   NA 139 08/10/2018 0825   K 4.5 08/10/2018 0825   CL 104 08/10/2018 0825   CO2 27 08/10/2018 0825   GLUCOSE 147 (H) 08/10/2018 0825   BUN 12 08/10/2018 0825   CREATININE 1.07 08/10/2018 0825   CREATININE 1.37 (H) 10/07/2014 1202   CALCIUM 9.2 08/10/2018 0825   PROT 7.1 08/10/2018 0825   ALBUMIN 4.5 08/10/2018 0825   AST 21 08/10/2018 0825   ALT 24 08/10/2018 0825   ALKPHOS 65 08/10/2018 0825   BILITOT 0.6 08/10/2018 0825   GFRNONAA >60 05/10/2018 0545   GFRNONAA 61 10/07/2014 1202   GFRAA >60 05/10/2018 0545   GFRAA 70 10/07/2014 1202       Component Value Date/Time  WBC 4.4 08/10/2018 0825   RBC 5.28 08/10/2018 0825   HGB 16.0 08/10/2018 0825   HCT 46.4 08/10/2018 0825   PLT 215.0 08/10/2018 0825   MCV 87.8 08/10/2018 0825   MCH 29.9 12/11/2017 0958   MCHC 34.5 08/10/2018 0825   RDW 13.8 08/10/2018 0825   LYMPHSABS 1.2 08/10/2018 0825   MONOABS 0.3 08/10/2018 0825   EOSABS 0.1 08/10/2018 0825   BASOSABS 0.0 08/10/2018 0825    No results found for: POCLITH, LITHIUM   No results found for: PHENYTOIN, PHENOBARB, VALPROATE, CBMZ   .res Assessment: Plan:   Addressed  patient's concerns regarding memory and concentration.  Discussed that concentration typically worsens with increased anxiety.  Also provided patient reassurance that he scored perfectly on MMSE and was able to recall MMSE questions from last exam and that it was unlikely that he has dementia.  Recommended patient schedule appointment with therapist since increased anxiety seems to be in response to situational stressors. Patient reports that he would like to continue Prozac 40 mg daily since he reports that overall his anxiety is well controlled and better than he would have expected it to be considering current stressors. Patient to follow-up with this provider in 3 months or sooner if clinically indicated.  Generalized anxiety disorder - Plan: FLUoxetine (PROZAC) 40 MG capsule  Mixed obsessional thoughts and acts - Plan: FLUoxetine (PROZAC) 40 MG capsule  Panic disorder - Plan: FLUoxetine (PROZAC) 40 MG capsule  Please see After Visit Summary for patient specific instructions.  Future Appointments  Date Time Provider [place]  08/15/2018  7:00 AM [facility] MR 2 [id number]    No orders of the defined types were placed in this encounter.     -------------------------------

## 2018-08-11 NOTE — Assessment & Plan Note (Signed)

## 2018-08-11 NOTE — Assessment & Plan Note (Signed)
Stable, cont to f/u with psychiatry

## 2018-08-11 NOTE — Patient Instructions (Signed)
Please take all new medication as prescribed - the nasacort  You will be contacted regarding the referral for: MRI  Please continue all other medications as before, and refills have been done if requested.  Please have the pharmacy call with any other refills you may need.  Please continue your efforts at being more active, low cholesterol diet, and weight control.  You are otherwise up to date with prevention measures today.  Please keep your appointments with your specialists as you may have planned  Please go to the LAB in the Basement (turn left off the elevator) for the tests to be done today  You will be contacted by phone if any changes need to be made immediately.  Otherwise, you will receive a letter about your results with an explanation, but please check with MyChart first.   Please remember to sign up for MyChart if you have not done so, as this will be important to you in the future with finding out test results, communicating by private email, and scheduling acute appointments online when needed.  Please return in 6 months, or sooner if needed

## 2018-08-11 NOTE — Progress Notes (Signed)
Patient ID: Jacob [name], male   DOB: [birth year], 53 y.o.   MRN: [id number]  Virtual Visit via Video Note  I connected with CAMARON CAMMACK [name] on 08/11/18 at  3:40 PM EDT by a video enabled telemedicine application and verified that I am speaking with the correct person using two identifiers.  Location: Patient: at home Provider: at office   I discussed the limitations of evaluation and management by telemedicine and the availability of in person appointments. The patient expressed understanding and agreed to proceed.  History of Present Illness: Here to f/u with c/o worsening hypersomnolence during the day and girlfriend says he does snore at night, asks for pulm referral for possible sleep apnea.  Pt denies chest pain, increased sob or doe, wheezing, orthopnea, PND, increased LE swelling, palpitations, dizziness or syncope.   Pt denies polydipsia, polyuria Past Medical History:  Diagnosis Date  . Allergic rhinitis   . Anxiety   . Arthritis   . Cholesteatoma of right ear   . 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 Dysuria   . Enlarged prostate    pt unaware  . GERD (gastroesophageal reflux disease)   . Headache   . Hearing loss    Right ear  . Hernia of abdominal wall   . History of gross hematuria   . History of head injury   . History of kidney stones   . History of palpitations   . Hyperlipidemia 10/18/2014  . Hypertension   . Insomnia   . Lack of concentration   . Nasal fracture    Several  . Otitis media 11/10/2017   Bilateral  . Panic attacks   . Syncope   . Vertigo    Past Surgical History:  Procedure Laterality Date  . CHOLECYSTECTOMY    . CYSTOSCOPY N/A 05/10/2018   Procedure: CYSTOSCOPY WITH EXAM UNDER ANESTHESIA;  Surgeon: Ceasar Mons, MD;  [facility]: [facility];  Service: Urology;  Laterality: N/A;  ONLY NEEDS 30 MIN  . [place]  . INNER EAR SURGERY     right  . LUMBAR DISC SURGERY    . MASTOIDECTOMY  Right   . NOSE SURGERY     Fracture  . tubes in bil ears    . UPPER GI ENDOSCOPY  01/09/2012   Esophageal dilation    reports that he has never smoked. He has never used smokeless tobacco. He reports current alcohol use of about 5.0 standard drinks of alcohol per week. He reports that he does not use drugs. family history includes ADD / ADHD in his son; Anxiety disorder in his cousin, maternal aunt, maternal grandmother, maternal uncle, and mother; COPD in his mother; Cancer in his father; Diabetes in his maternal grandmother and paternal grandmother; Drug abuse in his son; Heart attack in his brother; Heart disease in his father; Hypertension in his mother; Lung cancer in his maternal uncle; OCD in his maternal aunt. No Known Allergies Current Outpatient Medications on File Prior to Visit  Medication Sig Dispense Refill  . ALPRAZolam (XANAX) 0.5 MG tablet Take 1-2 tablets (0.5-1 mg total) by mouth at bedtime as needed. 60 tablet 5  . aspirin EC 81 MG tablet Take 1 tablet (81 mg total) by mouth daily. 90 tablet 11  . FLUoxetine (PROZAC) 40 MG capsule Take 1 capsule (40 mg total) by mouth daily. 90 capsule 0  . metFORMIN (GLUCOPHAGE-XR) 500 MG 24 hr tablet TAKE 3 TABLETS BY MOUTH ONCE  DAILY WITH BREAKFAST 270 tablet 0  . Multiple Vitamin (MULTIVITAMIN WITH MINERALS) TABS tablet Take 1 tablet by mouth daily.    . naproxen (NAPROSYN) 500 MG tablet Take 500 mg by mouth as needed.   1  . Omega-3 Fatty Acids (FISH OIL PO) Take 1 capsule by mouth daily.    . pantoprazole (PROTONIX) 40 MG tablet Take 1 tablet (40 mg total) by mouth daily. (Patient taking differently: Take 40 mg by mouth every morning. ) 90 tablet 3  . ramipril (ALTACE) 10 MG capsule TAKE 1 CAPSULE BY MOUTH EVERY DAY 90 capsule 0  . rosuvastatin (CRESTOR) 10 MG tablet Take 1 tablet (10 mg total) by mouth daily. (Patient taking differently: Take 10 mg by mouth every morning. ) 90 tablet 3  . tadalafil (CIALIS) 20 MG tablet Take 1 tablet  (20 mg total) by mouth daily as needed for erectile dysfunction. 10 tablet 11  . tamsulosin (FLOMAX) 0.4 MG CAPS capsule Take 0.4 mg by mouth.    . triamcinolone (NASACORT) 55 MCG/ACT AERO nasal inhaler Place 2 sprays into the nose daily. 1 Inhaler 12   No current facility-administered medications on file prior to visit.     Observations/Objective: Alert, NAD, appropriate mood and affect, resps normal, cn 2-12 intact, moves all 4s, no visible rash or swelling Lab Results  Component Value Date   WBC 4.4 08/10/2018   HGB 16.0 08/10/2018   HCT 46.4 08/10/2018   PLT 215.0 08/10/2018   GLUCOSE 147 (H) 08/10/2018   CHOL 120 08/10/2018   TRIG 79.0 08/10/2018   HDL 57.90 08/10/2018   LDLDIRECT 102.0 11/23/2016   LDLCALC 47 08/10/2018   ALT 24 08/10/2018   AST 21 08/10/2018   NA 139 08/10/2018   K 4.5 08/10/2018   CL 104 08/10/2018   CREATININE 1.07 08/10/2018   BUN 12 08/10/2018   CO2 27 08/10/2018   TSH 1.19 08/10/2018   PSA 1.07 08/10/2018   INR 0.96 12/11/2017   HGBA1C 6.8 (H) 08/10/2018   MICROALBUR <0.7 10/08/2017     Assessment and Plan: See notes  Follow Up Instructions: See notes   I discussed the assessment and treatment plan with the patient. The patient was provided an opportunity to ask questions and all were answered. The patient agreed with the plan and demonstrated an understanding of the instructions.   The patient was advised to call back or seek an in-person evaluation if the symptoms worsen or if the condition fails to improve as anticipated.  Cathlean Cower, MD

## 2018-08-11 NOTE — Assessment & Plan Note (Addendum)
?   Pseudodementia vs other - for B12, and MRI brain,  to f/u any worsening symptoms or concerns  In addition to the time spent performing CPE, I spent an additional 25 minutes face to face,in which greater than 50% of this time was spent in counseling and coordination of care for patient's acute illness as documented, including the differential dx, treatment, further evaluation and other management of memory loss , allergies, and anxiety

## 2018-08-11 NOTE — Assessment & Plan Note (Signed)
Mild worsening, advised wt control, refer pulm per pt reqeust for possible osa

## 2018-08-11 NOTE — Assessment & Plan Note (Signed)
Encouraged to f/u BP at home and next visit, goal < 140/90

## 2018-08-11 NOTE — Assessment & Plan Note (Signed)
Mild uncontrolled, for nasaocrt asd,  to f/u any worsening symptoms or concerns

## 2018-08-11 NOTE — Patient Instructions (Signed)
Please continue all other medications as before, and refills have been done if requested.  Please have the pharmacy call with any other refills you may need.  Please keep your appointments with your specialists as you may have planned  You will be contacted regarding the referral for: pulmonary

## 2018-08-12 ENCOUNTER — Encounter: Payer: Self-pay | Admitting: Psychiatry

## 2018-08-15 ENCOUNTER — Ambulatory Visit
Admission: RE | Admit: 2018-08-15 | Discharge: 2018-08-15 | Disposition: A | Discharge status: Home / Self Care | Payer: 59 | Source: Ambulatory Visit | Attending: Internal Medicine | Admitting: Internal Medicine

## 2018-08-15 ENCOUNTER — Other Ambulatory Visit: Payer: Self-pay

## 2018-08-15 DIAGNOSIS — R413 Other amnesia: Secondary | ICD-10-CM

## 2018-08-15 DIAGNOSIS — H6983 Other specified disorders of Eustachian tube, bilateral: Secondary | ICD-10-CM | POA: Insufficient documentation

## 2018-08-19 ENCOUNTER — Encounter: Payer: Self-pay | Admitting: Internal Medicine

## 2018-08-19 ENCOUNTER — Ambulatory Visit: Payer: 59

## 2018-08-19 ENCOUNTER — Ambulatory Visit: Payer: 59 | Admitting: Internal Medicine

## 2018-08-19 ENCOUNTER — Other Ambulatory Visit: Payer: Self-pay

## 2018-08-19 VITALS — BP 112/60 | HR 67 | Temp 98.3°F | Ht 71.75 in | Wt 183.4 lb

## 2018-08-19 DIAGNOSIS — G479 Sleep disorder, unspecified: Secondary | ICD-10-CM | POA: Diagnosis not present

## 2018-08-19 DIAGNOSIS — G4733 Obstructive sleep apnea (adult) (pediatric): Secondary | ICD-10-CM

## 2018-08-19 DIAGNOSIS — K219 Gastro-esophageal reflux disease without esophagitis: Secondary | ICD-10-CM | POA: Diagnosis not present

## 2018-08-19 NOTE — Progress Notes (Signed)
08/18/1008- 52 yoM never smoker referred by Dr. Jenny Reichmann (PCP) for hypersomnolence; pt states he sleeps, but never feels fully rested, significant other reports pt snoring Medical problem list include syncope, HBP, Allergic Rhinitis, GERD, DM2, Lumbar disk disease, Chronic Pain, BPH, Depression/ Anxiety,  -----referred by Dr. Jenny Reichmann (PCP) for hypersomnolence; pt states he sleeps, but never feels fully rested, significant other reports pt snoring. "I'm so hyper, but I always feel tired and dragging." If wakes during night , hard to get back to sleep.  Has to have perfect setting to sleep- quiet, cool, dark. No parasomnias-did sleep walk on Ambien.. All life has had sleep problems. Can't nap if he tries. Has worked 3rd shift x 20 years, now on 1st shift, but going back to 38rd. Has taken 1/2 x 0.5 mg Xanax for sleep x 20 years. Prozac helps anxiety attacks. Elias-Fela Solis Dews/ day.  Protonix controls GERD.  Remote nasal surgery Epworth score 1 "but wold feel sleepy"  Prior to Admission medications   Medication Sig Start Date End Date Taking? Authorizing Provider  ALPRAZolam Duanne Moron) 0.5 MG tablet Take 1-2 tablets (0.5-1 mg total) by mouth at bedtime as needed. 04/22/18  Yes Biagio Borg, MD  aspirin EC 81 MG tablet Take 1 tablet (81 mg total) by mouth daily. 05/09/14  Yes Biagio Borg, MD  FLUoxetine (PROZAC) 40 MG capsule Take 1 capsule (40 mg total) by mouth daily. 08/11/18 11/09/18 Yes Thayer Headings, PMHNP  metFORMIN (GLUCOPHAGE-XR) 500 MG 24 hr tablet TAKE 3 TABLETS BY MOUTH ONCE DAILY WITH BREAKFAST 06/29/18  Yes Biagio Borg, MD  Multiple Vitamin (MULTIVITAMIN WITH MINERALS) TABS tablet Take 1 tablet by mouth daily.   Yes [provider]  naproxen (NAPROSYN) 500 MG tablet Take 500 mg by mouth as needed.  09/18/17  Yes [provider]  Omega-3 Fatty Acids (FISH OIL PO) Take 1 capsule by mouth daily.   Yes [provider]  pantoprazole (PROTONIX) 40 MG tablet Take 1 tablet (40 mg  total) by mouth daily. Patient taking differently: Take 40 mg by mouth every morning.  06/09/17  Yes Biagio Borg, MD  ramipril (ALTACE) 10 MG capsule TAKE 1 CAPSULE BY MOUTH EVERY DAY 06/30/18  Yes Biagio Borg, MD  rosuvastatin (CRESTOR) 10 MG tablet Take 1 tablet (10 mg total) by mouth daily. Patient taking differently: Take 10 mg by mouth every morning.  11/26/17  Yes Biagio Borg, MD  tamsulosin (FLOMAX) 0.4 MG CAPS capsule Take 0.4 mg by mouth.   Yes [provider]  triamcinolone (NASACORT) 55 MCG/ACT AERO nasal inhaler Place 2 sprays into the nose daily. 08/08/18  Yes Biagio Borg, MD  tadalafil (CIALIS) 20 MG tablet Take 1 tablet (20 mg total) by mouth daily as needed for erectile dysfunction. 10/08/17 05/10/18  Biagio Borg, MD   Past Medical History:  Diagnosis Date  . Allergic rhinitis   . Anxiety   . Arthritis   . Cholesteatoma of right ear   . Diabetes mellitus ([place])    bordreline  . Dysuria   . Enlarged prostate    pt unaware  . GERD (gastroesophageal reflux disease)   . Headache   . Hearing loss    Right ear  . Hernia of abdominal wall   . History of gross hematuria   . History of head injury   . History of kidney stones   . History of palpitations   . Hyperlipidemia 10/18/2014  . Hypertension   .  Insomnia   . Lack of concentration   . Nasal fracture    Several  . Otitis media 11/10/2017   Bilateral  . Panic attacks   . Syncope   . Vertigo    Past Surgical History:  Procedure Laterality Date  . CHOLECYSTECTOMY    . CYSTOSCOPY N/A 05/10/2018   Procedure: CYSTOSCOPY WITH EXAM UNDER ANESTHESIA;  Surgeon: Ceasar Mons, MD;  Location: [facility];  Service: Urology;  Laterality: N/A;  ONLY NEEDS 30 MIN  . [place]  . INNER EAR SURGERY     right  . LUMBAR DISC SURGERY    . MASTOIDECTOMY Right   . NOSE SURGERY     Fracture  . tubes in bil ears    . UPPER GI ENDOSCOPY  01/09/2012   Esophageal  dilation   Family History  Problem Relation Age of Onset  . COPD Mother   . Hypertension Mother   . Anxiety disorder Mother   . Heart disease Father   . Cancer Father   . Diabetes Paternal Grandmother   . Diabetes Maternal Grandmother   . Anxiety disorder Maternal Grandmother   . Heart attack Brother   . Lung cancer Maternal Uncle   . Anxiety disorder Maternal Uncle   . Anxiety disorder Maternal Aunt   . OCD Maternal Aunt   . Anxiety disorder Cousin   . ADD / ADHD Son   . Drug abuse Son   . Colon cancer Neg Hx   . Esophageal cancer Neg Hx   . Rectal cancer Neg Hx   . Stomach cancer Neg Hx    Social History   Socioeconomic History  . Marital status: Single    Spouse name: Not on file  . Number of children: 3  . Years of education: Not on file  . Highest education level: Not on file  Occupational History  . Occupation: Music therapist: [employer]  . Financial resource strain: Not on file  . Food insecurity:    Worry: Not on file    Inability: Not on file  . Transportation needs:    Medical: Not on file    Non-medical: Not on file  Tobacco Use  . Smoking status: Never Smoker  . Smokeless tobacco: Never Used  Substance and Sexual Activity  . Alcohol use: Yes    Alcohol/week: 5.0 standard drinks    Types: 5 Glasses of wine per week    Comment: weekends  . Drug use: No  . Sexual activity: Yes    Partners: Female    Birth control/protection: None  Lifestyle  . Physical activity:    Days per week: Not on file    Minutes per session: Not on file  . Stress: Not on file  Relationships  . Social connections:    Talks on phone: Not on file    Gets together: Not on file    Attends religious service: Not on file    Active member of club or organization: Not on file    Attends meetings of clubs or organizations: Not on file    Relationship status: Not on file  . Intimate partner violence:    Fear of current or ex partner: Not on file     Emotionally abused: Not on file    Physically abused: Not on file    Forced sexual activity: Not on file  Other Topics Concern  . Not on file  Social History  Narrative  . Not on file   ROS-see HPI   + = positive Constitutional:    weight loss, night sweats, fevers, chills, fatigue, lassitude. HEENT:    headaches, difficulty swallowing, tooth/dental problems, sore throat,       sneezing, itching, ear ache, nasal congestion, post nasal drip, snoring CV:    chest pain, orthopnea, PND, swelling in lower extremities, anasarca,                                  dizziness, palpitations Resp:   shortness of breath with exertion or at rest.                productive cough,   non-productive cough, coughing up of blood.              change in color of mucus.  wheezing.   Skin:    rash or lesions. GI: + heartburn, +indigestion, abdominal pain, nausea, vomiting, diarrhea,                 change in bowel habits, loss of appetite GU: dysuria, change in color of urine, no urgency or frequency.   flank pain. MS:   +joint pain, stiffness, decreased range of motion, back pain. Neuro-     nothing unusual Psych:  change in mood or affect.  depression or +anxiety.   memory loss.  OBJ- Physical Exam General- Alert, Oriented, Affect-appropriate, Distress- none acute, trim Skin- rash-none, lesions- none, excoriation- none Lymphadenopathy- none Head- atraumatic            Eyes- Gross vision intact, PERRLA, conjunctivae and secretions clear            Ears- Hearing, canals-normal            Nose- Clear, no-Septal dev, mucus, polyps, erosion, perforation             Throat- Mallampati III , mucosa clear , drainage- none, tonsils- atrophic,  + missing teeth Neck- flexible , trachea midline, no stridor , thyroid nl, carotid no bruit Chest - symmetrical excursion , unlabored           Heart/CV- RRR , no murmur , no gallop  , no rub, nl s1 s2                           - JVD- none , edema- none, stasis changes-  none, varices- none           Lung- clear to P&A, wheeze- none, cough- none , dullness-none, rub- none           Chest wall-  Abd-  Br/ Gen/ Rectal- Not done, not indicated Extrem- cyanosis- none, clubbing, none, atrophy- none, strength- nl Neuro- grossly intact to observation

## 2018-08-19 NOTE — Patient Instructions (Signed)
Order- please schedule unattended home sleep test   Dx OSA  Please call as needed

## 2018-08-20 DIAGNOSIS — R0683 Snoring: Secondary | ICD-10-CM | POA: Diagnosis not present

## 2018-08-23 DIAGNOSIS — G479 Sleep disorder, unspecified: Secondary | ICD-10-CM | POA: Insufficient documentation

## 2018-08-23 NOTE — Assessment & Plan Note (Signed)
He describes this as controlled. Discussed symptoms to watch for as potential overlap with sleep disordered breathing.

## 2018-08-23 NOTE — Assessment & Plan Note (Signed)
Snoring suggests OSA- need sleep study to rule this out. 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 Insomnia component may relate to known anxiety and other sleep disorders.

## 2018-08-31 DIAGNOSIS — R0683 Snoring: Secondary | ICD-10-CM | POA: Diagnosis not present

## 2018-09-19 ENCOUNTER — Other Ambulatory Visit: Payer: Self-pay

## 2018-09-19 ENCOUNTER — Ambulatory Visit (INDEPENDENT_AMBULATORY_CARE_PROVIDER_SITE_OTHER): Payer: Self-pay | Admitting: Family

## 2018-09-19 ENCOUNTER — Telehealth: Payer: Self-pay | Admitting: *Deleted

## 2018-09-19 ENCOUNTER — Ambulatory Visit: Payer: Self-pay | Admitting: Internal Medicine

## 2018-09-19 ENCOUNTER — Encounter: Payer: Self-pay | Admitting: Family

## 2018-09-19 DIAGNOSIS — Z20822 Contact with and (suspected) exposure to covid-19: Secondary | ICD-10-CM

## 2018-09-19 DIAGNOSIS — Z20828 Contact with and (suspected) exposure to other viral communicable diseases: Secondary | ICD-10-CM

## 2018-09-19 NOTE — Telephone Encounter (Signed)
-----   Message from Cresenciano Lick, [place] sent at 09/19/2018  4:07 PM EDT ----- Regarding: COVID19 Patient needs testing per Dr. Jenny Reichmann for headache, loss of taste, chills, body aches, fever. Thanks!

## 2018-09-19 NOTE — Telephone Encounter (Signed)
OVID test request Received: Today Message Contents  Jacob [name], Jacob [name]  DOB: December 02, 1965  MRN# [id number]   Reason: COVID symptoms: headache, loss of taste, chills, body aches, fever   Insurance: [employer]  ID# [id number]  Group # [id number]   Contact number: [phone]    appointment today at the [facility] site at 11:45am Informed to wear mask and stay in vehicle.

## 2018-09-19 NOTE — Telephone Encounter (Signed)
Patient had visit today with Jacob [name]., NP and message sent to [facility] testing pool in case testing was not already requested.

## 2018-09-19 NOTE — Progress Notes (Signed)
Jacob [name] is a 53 y.o. male with the following history as recorded in EpicCare:  Patient Active Problem List   Diagnosis Date Noted  . Sleep disorder 08/23/2018  . Memory loss 08/11/2018  . Dysuria 04/22/2018  . Gross hematuria 04/22/2018  . STD exposure 04/22/2018  . Lack of concentration 12/21/2017  . Panic attacks 12/21/2017  . Obsessional thoughts 12/21/2017  . Acute sinus infection 06/11/2017  . Colon cancer screening 04/09/2017  . Nausea without vomiting 04/09/2017  . Dysphagia 04/09/2017  . Dyspepsia 11/19/2016  . Right otitis media 09/09/2016  . Vertigo 09/09/2016  . Hearing loss, right 09/09/2016  . BPH (benign prostatic hyperplasia) 05/30/2016  . Right shoulder pain 05/30/2016  . Acute midline low back pain with sciatica 04/21/2016  . 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 04/21/2016  . Urinary frequency 02/05/2016  . Syncope 09/27/2015  . Left lateral epicondylitis 08/31/2015  . Allergic rhinitis 08/29/2015  . Bilateral hearing loss 06/05/2015  . Hypersomnolence 06/05/2015  . Chest pain 01/23/2015  . Chronic pain syndrome 01/23/2015  . Hyperlipidemia 10/18/2014  . Impingement syndrome of right shoulder 06/28/2014  . Hip flexor tendon tightness 06/28/2014  . Right knee pain 06/28/2014  . Depression with anxiety 01/30/2013  . Diabetes mellitus without complication ([place]) 24/11/7351  . Encounter for well adult exam with abnormal findings 01/11/2013  . Essential hypertension, benign 01/11/2013  . Low back pain syndrome 01/11/2013  . DJD (degenerative joint disease) 01/11/2013  . Erectile dysfunction 01/11/2013  . GERD (gastroesophageal reflux disease) 12/30/2011    Current Outpatient Medications  Medication Sig Dispense Refill  . ALPRAZolam (XANAX) 0.5 MG tablet Take 1-2 tablets (0.5-1 mg total) by mouth at bedtime as needed. 60 tablet 5  . aspirin EC 81 MG tablet Take 1 tablet (81 mg total) by mouth daily. 90 tablet 11  . FLUoxetine (PROZAC)  40 MG capsule Take 1 capsule (40 mg total) by mouth daily. 90 capsule 0  . metFORMIN (GLUCOPHAGE-XR) 500 MG 24 hr tablet TAKE 3 TABLETS BY MOUTH ONCE DAILY WITH BREAKFAST 270 tablet 0  . Multiple Vitamin (MULTIVITAMIN WITH MINERALS) TABS tablet Take 1 tablet by mouth daily.    . naproxen (NAPROSYN) 500 MG tablet Take 500 mg by mouth as needed.   1  . Omega-3 Fatty Acids (FISH OIL PO) Take 1 capsule by mouth daily.    . pantoprazole (PROTONIX) 40 MG tablet Take 1 tablet (40 mg total) by mouth daily. (Patient taking differently: Take 40 mg by mouth every morning. ) 90 tablet 3  . ramipril (ALTACE) 10 MG capsule TAKE 1 CAPSULE BY MOUTH EVERY DAY 90 capsule 0  . rosuvastatin (CRESTOR) 10 MG tablet Take 1 tablet (10 mg total) by mouth daily. (Patient taking differently: Take 10 mg by mouth every morning. ) 90 tablet 3  . tadalafil (CIALIS) 20 MG tablet Take 1 tablet (20 mg total) by mouth daily as needed for erectile dysfunction. 10 tablet 11  . tamsulosin (FLOMAX) 0.4 MG CAPS capsule Take 0.4 mg by mouth.    . triamcinolone (NASACORT) 55 MCG/ACT AERO nasal inhaler Place 2 sprays into the nose daily. 1 Inhaler 12   No current facility-administered medications for this visit.     Allergies: Patient has no known allergies.  Past Medical History:  Diagnosis Date  . Allergic rhinitis   . Anxiety   . Arthritis   . Cholesteatoma of right ear   . Diabetes mellitus ([place])    bordreline  .  Dysuria   . Enlarged prostate    pt unaware  . GERD (gastroesophageal reflux disease)   . Headache   . Hearing loss    Right ear  . Hernia of abdominal wall   . History of gross hematuria   . History of head injury   . History of kidney stones   . History of palpitations   . Hyperlipidemia 10/18/2014  . Hypertension   . Insomnia   . Lack of concentration   . Nasal fracture    Several  . Otitis media 11/10/2017   Bilateral  . Panic attacks   . Syncope   . Vertigo     Past Surgical History:  Procedure  Laterality Date  . CHOLECYSTECTOMY    . CYSTOSCOPY N/A 05/10/2018   Procedure: CYSTOSCOPY WITH EXAM UNDER ANESTHESIA;  Surgeon: Ceasar Mons, MD;  Location: [facility];  Service: Urology;  Laterality: N/A;  ONLY NEEDS 30 MIN  . [place]  . INNER EAR SURGERY     right  . LUMBAR DISC SURGERY    . MASTOIDECTOMY Right   . NOSE SURGERY     Fracture  . tubes in bil ears    . UPPER GI ENDOSCOPY  01/09/2012   Esophageal dilation    Family History  Problem Relation Age of Onset  . COPD Mother   . Hypertension Mother   . Anxiety disorder Mother   . Heart disease Father   . Cancer Father   . Diabetes Paternal Grandmother   . Diabetes Maternal Grandmother   . Anxiety disorder Maternal Grandmother   . Heart attack Brother   . Lung cancer Maternal Uncle   . Anxiety disorder Maternal Uncle   . Anxiety disorder Maternal Aunt   . OCD Maternal Aunt   . Anxiety disorder Cousin   . ADD / ADHD Son   . Drug abuse Son   . Colon cancer Neg Hx   . Esophageal cancer Neg Hx   . Rectal cancer Neg Hx   . Stomach cancer Neg Hx     Social History   Tobacco Use  . Smoking status: Never Smoker  . Smokeless tobacco: Never Used  Substance Use Topics  . Alcohol use: Yes    Alcohol/week: 5.0 standard drinks    Types: 5 Glasses of wine per week    Comment: weekends    Subjective:    I connected with Jacob [name] on 09/19/18 at  9:40 AM EDT by a telephone call  and verified that I am speaking with the correct person using two identifiers.   I discussed the limitations of evaluation and management by telemedicine and the availability of in person appointments. The patient expressed understanding and agreed to proceed.  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  General: In no acute distress  Lungs: Respirations unlabored;  Neurologic: Alert and oriented; speech intact;   Assessment:  1. Exposure to Covid-19 Virus     Plan:  Symptomatic treatment discussed; strict ER precautions given; schedule for COVID testing today; patient understands to quarantine until he gets his test results; follow-up to  be determined.  Time spent on phone call 8 minutes  No follow-ups on file.  No orders of the defined types were placed in this encounter.   Requested Prescriptions    No prescriptions requested or ordered in this encounter

## 2018-09-19 NOTE — Telephone Encounter (Signed)
Message from Pauline Good sent at 09/19/2018 8:36 AM EDT  Pt has a fever, body aches, diarrhea and feels like crap. Pt want to get tested for covid. Please advise    Returned call to patient regarding the symptoms mentioned above. He denies cough, shortness of breath. He also has a headache, fatigue and loss of taste. His temperature has been up to 103 that was on Saturday and still the lowest has been 99. He has been taking medication for his fever. He is requesting to get tested for covid-19. He lives with his fiancee and another family member. Advised to quarantine himself and stay hydrated. Pt voiced understanding. Notified [facility] for an appointment. Routing to the practice for review.  Reason for Disposition . Fever present > 3 days (72 hours)  Answer Assessment - Initial Assessment Questions 1. COVID-19 DIAGNOSIS: "Who made your Coronavirus (COVID-19) diagnosis?" "Was it confirmed by a positive lab test?" If not diagnosed by a HCP, ask "Are there lots of cases (community spread) where you live?" (See public health department website, if unsure)    In the community 2. ONSET: "When did the COVID-19 symptoms start?"      Last Saturday 3. WORST SYMPTOM: "What is your worst symptom?" (e.g., cough, fever, shortness of breath, muscle aches)     Body aches 4. COUGH: "Do you have a cough?" If so, ask: "How bad is the cough?"       no 5. FEVER: "Do you have a fever?" If so, ask: "What is your temperature, how was it measured, and when did it start?"     Yes has been 103 to 99 and now 100.6 6. RESPIRATORY STATUS: "Describe your breathing?" (e.g., shortness of breath, wheezing, unable to speak)      No shortness of breath 7. BETTER-SAME-WORSE: "Are you getting better, staying the same or getting worse compared to yesterday?"  If getting worse, ask, "In what way?"     About the same 8. HIGH RISK DISEASE: "Do you have any chronic medical problems?" (e.g., asthma, heart or lung disease,  weak immune system, etc.)    Diabetes that is well controllded  9. PREGNANCY: "Is there any chance you are pregnant?" "When was your last menstrual period?"     no 10. OTHER SYMPTOMS: "Do you have any other symptoms?"  (e.g., chills, fatigue, headache, loss of smell or taste, muscle pain, sore throat)       Chills, headache, fatigue, loss of taste, muscle pain, diarrhea and little sore throat.  Protocols used: CORONAVIRUS (COVID-19) DIAGNOSED OR SUSPECTED-A-AH

## 2018-09-19 NOTE — Telephone Encounter (Signed)
Phone call to patient.  He states he had COVID 19 test this morning.

## 2018-09-19 NOTE — Telephone Encounter (Signed)
Ok to refer to [place]

## 2018-09-20 ENCOUNTER — Ambulatory Visit: Payer: Self-pay | Admitting: Internal Medicine

## 2018-09-20 NOTE — Telephone Encounter (Signed)
Pt had virtual visit yesterday, severe diarrhea fever. Went for Covid testing yesterday. Pt calling today for advise on what to eat, drink, as diarrhea continues. States "Little better today, not going as much at a time."  Temp this AM 100.8. Denies SOB,CP. Reports cramping lower abdomen prior to BM, then resolves. States stools remain watery. "Not as much at a time." Denies further vomiting. States is staying hydrated with gatorade, water, "Drinking a lot." Urinating WNL. Also has been drinking orange juice, advised against. Covid results pending. Care advise given regarding diet, fluids, home care. Reviewed S/S of dehydration.  Advised to go to ED if symptoms worsen, dizziness occurs, SOB, CP severe abdominal pain, fever of 103.0. Pt verbalizes understanding.  Reason for Disposition . SEVERE diarrhea (e.g., 7 or more times / day more than normal)  Answer Assessment - Initial Assessment Questions 1. DIARRHEA SEVERITY: "How bad is the diarrhea?" "How many extra stools have you had in the past 24 hours than normal?"    - NO DIARRHEA (SCALE 0)   - MILD (SCALE 1-3): Few loose or mushy BMs; increase of 1-3 stools over normal daily number of stools; mild increase in ostomy output.   -  MODERATE (SCALE 4-7): Increase of 4-6 stools daily over normal; moderate increase in ostomy output. * SEVERE (SCALE 8-10; OR 'WORST POSSIBLE'): Increase of 7 or more stools daily over normal; moderate increase in ostomy output; incontinence.    Moderate today 2. ONSET: "When did the diarrhea begin?"      Early Sunday morning 3. BM CONSISTENCY: "How loose or watery is the diarrhea?"      watery 4. VOMITING: "Are you also vomiting?" If so, ask: "How many times in the past 24 hours?"     First day, one episode of vomiting 5. ABDOMINAL PAIN: "Are you having any abdominal pain?" If yes: "What does it feel like?" (e.g., crampy, dull, intermittent, constant)      Cramping before BMS, then resolves 6. ABDOMINAL PAIN SEVERITY:  If present, ask: "How bad is the pain?"  (e.g., Scale 1-10; mild, moderate, or severe)   - MILD (1-3): doesn't interfere with normal activities, abdomen soft and not tender to touch    - MODERATE (4-7): interferes with normal activities or awakens from sleep, tender to touch    - SEVERE (8-10): excruciating pain, doubled over, unable to do any normal activities       Moderate 7. ORAL INTAKE: If vomiting, "Have you been able to drink liquids?" "How much fluids have you had in the past 24 hours?"     Staying hydrated 8. HYDRATION: "Any signs of dehydration?" (e.g., dry mouth [not just dry lips], too weak to stand, dizziness, new weight loss) "When did you last urinate?"     Drinking gatorade, water 9. EXPOSURE: "Have you traveled to a foreign country recently?" "Have you been exposed to anyone with diarrhea?" "Could you have eaten any food that was spoiled?"     no 10. ANTIBIOTIC USE: "Are you taking antibiotics now or have you taken antibiotics in the past 2 months?"       no 11. OTHER SYMPTOMS: "Do you have any other symptoms?" (e.g., fever, blood in stool)       Temp 100.8 this AM  Protocols used: DIARRHEA-A-AH

## 2018-09-20 NOTE — Telephone Encounter (Signed)
Ok to start the [employer] (banana, rice, applesauce, toast ) as this is benign and should not upset the stomach or make the diarrhea worse

## 2018-09-21 NOTE — Telephone Encounter (Signed)
Pt has been informed and expressed understanding.  

## 2018-09-22 ENCOUNTER — Telehealth: Payer: Self-pay

## 2018-09-22 NOTE — Telephone Encounter (Signed)
I do not see any results at this time. Please advise.   Copied from [place] [phone]. Topic: General - Other >> Sep 22, 2018  8:12 AM Leward Quan A wrote: Reason for CRM: Patient called to get the results of his COVID test asking for the results as soon as they are in. Per patient he fears loosing his new job from being out of work.

## 2018-09-22 NOTE — Telephone Encounter (Signed)
Pt has been informed.

## 2018-09-22 NOTE — Telephone Encounter (Signed)
Very sorry, this result is not yet available.

## 2018-09-24 LAB — NOVEL CORONAVIRUS, NAA: SARS-CoV-2, NAA: NOT DETECTED

## 2018-09-26 ENCOUNTER — Other Ambulatory Visit: Payer: Self-pay | Admitting: Internal Medicine

## 2018-09-27 ENCOUNTER — Telehealth: Payer: Self-pay

## 2018-09-27 NOTE — Telephone Encounter (Signed)
Attempted to contact pt also, please view previous telephone encounter.

## 2018-09-27 NOTE — Telephone Encounter (Signed)
-----   Message from [place] sent at 09/27/2018 11:02 AM EDT ----- Attempted to reach pt, no answer. Left vm to call back. Forwarding result note to pcp's office to follow up on

## 2018-09-27 NOTE — Telephone Encounter (Signed)
Attempted to contact pt about result, no answer, LVM.  Pt is negative for covid19.

## 2018-09-28 ENCOUNTER — Other Ambulatory Visit: Payer: Self-pay | Admitting: Internal Medicine

## 2018-09-28 DIAGNOSIS — E119 Type 2 diabetes mellitus without complications: Secondary | ICD-10-CM

## 2018-09-28 NOTE — Telephone Encounter (Signed)
Patient would like a call because he is out of metformin and wants to know what to do since the office is closed Friday?

## 2018-10-03 ENCOUNTER — Other Ambulatory Visit: Payer: Self-pay | Admitting: Internal Medicine

## 2018-11-01 ENCOUNTER — Telehealth: Payer: Self-pay | Admitting: Internal Medicine

## 2018-11-01 ENCOUNTER — Ambulatory Visit: Payer: Self-pay

## 2018-11-01 DIAGNOSIS — E119 Type 2 diabetes mellitus without complications: Secondary | ICD-10-CM

## 2018-11-01 MED ORDER — RAMIPRIL 10 MG PO CAPS: ORAL_CAPSULE | ORAL | 1 refills | Status: DC

## 2018-11-01 MED ORDER — METFORMIN HCL ER 500 MG PO TB24: ORAL_TABLET | ORAL | 1 refills | Status: DC

## 2018-11-01 MED ORDER — ROSUVASTATIN CALCIUM 10 MG PO TABS: 10.0000 mg | ORAL_TABLET | Freq: Every day | ORAL | 1 refills | Status: DC

## 2018-11-01 NOTE — Telephone Encounter (Signed)
Medication Refill - Medication: metFORMIN (GLUCOPHAGE-XR) 500 MG 24 hr tablet/ramipril (ALTACE) 10 MG capsule/rosuvastatin (CRESTOR) 10 MG tablet / Pt is now using [employer]  Has the patient contacted their pharmacy? Yes.   (Agent: If no, request that the patient contact the pharmacy for the refill.) (Agent: If yes, when and what did the pharmacy advise?)  Preferred Pharmacy (with phone number or street name):  [place], [place] [phone] (Phone) [phone] (Fax)     Agent: Please be advised that RX refills may take up to 3 business days. We ask that you follow-up with your pharmacy.

## 2018-11-01 NOTE — Telephone Encounter (Signed)
Incoming call from Patient with complaint indigestion Rx running out r/t insurance.   Answer Assessment - Initial Assessment Questions 1. LOCATION: "Where does it hurt?"       Down throat 2. RADIATION: "Does the pain go anywhere else?" (e.g., into neck, jaw, arms, back)      3. ONSET: "When did the chest pain begin?" (Minutes, hours or days)      A log time ago 4. PATTERN "Does the pain come and go, or has it been constant since it started?"  "Does it get worse with exertion?"      Come and go 5. DURATION: "How long does it last" (e.g., seconds, minutes, hours)     *No Answer* 6. SEVERITY: "How bad is the pain?"  (e.g., Scale 1-10; mild, moderate, or severe)    - MILD (1-3): doesn't interfere with normal activities     - MODERATE (4-7): interferes with normal activities or awakens from sleep    - SEVERE (8-10): excruciating pain, unable to do any normal activities        Almost severe it He dosent have protonics.   7. CARDIAC RISK FACTORS: "Do you have any history of heart problems or risk factors for heart disease?" (e.g., angina, prior heart attack; diabetes, high blood pressure, high cholesterol, smoker, or strong family history of heart disease)     denies 8. PULMONARY RISK FACTORS: "Do you have any history of lung disease?"  (e.g., blood clots in lung, asthma, emphysema, birth control pills)    denies 9. CAUSE: "What do you think is causing the chest pain?"     *No Answer* 10. OTHER SYMPTOMS: "Do you have any other symptoms?" (e.g., dizziness, nausea, vomiting, sweating, fever, difficulty breathing, cough)       denies11. PREGNANCY: "Is there any chance you are pregnant?" "When was your last menstrual period?"       **na  Protocols used: CHEST PAIN-A-AH

## 2018-11-01 NOTE — Telephone Encounter (Signed)
  Reason for Disposition . [1] Patient says chest pain feels exactly the same as previously diagnosed "heartburn" AND [2] describes burning in chest AND [3] accompanying sour taste in mouth  Answer Assessment - Initial Assessment Questions 1. LOCATION: "Where does it hurt?"       Down throat 2. RADIATION: "Does the pain go anywhere else?" (e.g., into neck, jaw, arms, back)      3. ONSET: "When did the chest pain begin?" (Minutes, hours or days)      A log time ago 4. PATTERN "Does the pain come and go, or has it been constant since it started?"  "Does it get worse with exertion?"      Come and go 5. DURATION: "How long does it last" (e.g., seconds, minutes, hours)     *No Answer* 6. SEVERITY: "How bad is the pain?"  (e.g., Scale 1-10; mild, moderate, or severe)    - MILD (1-3): doesn't interfere with normal activities     - MODERATE (4-7): interferes with normal activities or awakens from sleep    - SEVERE (8-10): excruciating pain, unable to do any normal activities        Almost severe it He dosent have protonics.   7. CARDIAC RISK FACTORS: "Do you have any history of heart problems or risk factors for heart disease?" (e.g., angina, prior heart attack; diabetes, high blood pressure, high cholesterol, smoker, or strong family history of heart disease)     denies 8. PULMONARY RISK FACTORS: "Do you have any history of lung disease?"  (e.g., blood clots in lung, asthma, emphysema, birth control pills)    denies 9. CAUSE: "What do you think is causing the chest pain?"     *No Answer* 10. OTHER SYMPTOMS: "Do you have any other symptoms?" (e.g., dizziness, nausea, vomiting, sweating, fever, difficulty breathing, cough)       denies11. PREGNANCY: "Is there any chance you are pregnant?" "When was your last menstrual period?"       **na  Protocols used: CHEST PAIN-A-AH

## 2018-11-02 MED ORDER — PANTOPRAZOLE SODIUM 40 MG PO TBEC: 40.0000 mg | DELAYED_RELEASE_TABLET | Freq: Every day | ORAL | 1 refills | Status: DC

## 2018-11-02 NOTE — Telephone Encounter (Signed)
Needs refill of PROTONIX

## 2018-11-04 ENCOUNTER — Other Ambulatory Visit: Payer: Self-pay | Admitting: Internal Medicine

## 2018-11-10 ENCOUNTER — Emergency Department (HOSPITAL_COMMUNITY): Payer: No Typology Code available for payment source

## 2018-11-10 ENCOUNTER — Emergency Department (HOSPITAL_COMMUNITY)
Admission: EM | Admit: 2018-11-10 | Discharge: 2018-11-11 | Disposition: A | Discharge status: Home / Self Care | Payer: No Typology Code available for payment source | Attending: Emergency Medicine | Admitting: Emergency Medicine

## 2018-11-10 ENCOUNTER — Encounter (HOSPITAL_COMMUNITY): Payer: Self-pay | Admitting: Emergency Medicine

## 2018-11-10 ENCOUNTER — Other Ambulatory Visit: Payer: Self-pay

## 2018-11-10 DIAGNOSIS — Z79899 Other long term (current) drug therapy: Secondary | ICD-10-CM | POA: Insufficient documentation

## 2018-11-10 DIAGNOSIS — Z7982 Long term (current) use of aspirin: Secondary | ICD-10-CM | POA: Diagnosis not present

## 2018-11-10 DIAGNOSIS — M545 Low back pain, unspecified: Secondary | ICD-10-CM

## 2018-11-10 DIAGNOSIS — M542 Cervicalgia: Secondary | ICD-10-CM | POA: Diagnosis not present

## 2018-11-10 DIAGNOSIS — I10 Essential (primary) hypertension: Secondary | ICD-10-CM | POA: Insufficient documentation

## 2018-11-10 DIAGNOSIS — Z7984 Long term (current) use of oral hypoglycemic drugs: Secondary | ICD-10-CM | POA: Diagnosis not present

## 2018-11-10 DIAGNOSIS — M25511 Pain in right shoulder: Secondary | ICD-10-CM | POA: Insufficient documentation

## 2018-11-10 DIAGNOSIS — E119 Type 2 diabetes mellitus without complications: Secondary | ICD-10-CM | POA: Insufficient documentation

## 2018-11-10 MED ORDER — IBUPROFEN 200 MG PO TABS
600.0000 mg | ORAL_TABLET | Freq: Once | ORAL | Status: AC
  Administered 2018-11-11: 600 mg via ORAL
  Filled 2018-11-10: qty 3
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
## 2018-11-11 ENCOUNTER — Telehealth: Payer: Self-pay | Admitting: Internal Medicine

## 2018-11-11 DIAGNOSIS — F411 Generalized anxiety disorder: Secondary | ICD-10-CM

## 2018-11-11 DIAGNOSIS — E119 Type 2 diabetes mellitus without complications: Secondary | ICD-10-CM

## 2018-11-11 DIAGNOSIS — F410 Panic disorder [episodic paroxysmal anxiety] without agoraphobia: Secondary | ICD-10-CM

## 2018-11-11 DIAGNOSIS — F422 Mixed obsessional thoughts and acts: Secondary | ICD-10-CM

## 2018-11-11 MED ORDER — METHOCARBAMOL 500 MG PO TABS: 500.0000 mg | ORAL_TABLET | Freq: Two times a day (BID) | ORAL | 0 refills | Status: DC

## 2018-11-11 NOTE — ED Notes (Signed)
Pt ambulatory with steady gait and w/o assistance to and from CT

## 2018-11-11 NOTE — Telephone Encounter (Addendum)
Patient states he can not afford to go to a phyciatrist and would like to know if Dr. Jenny Reichmann would fill his Prozac.  Please advise.  Can not afford an in office appt with Dr. Jenny Reichmann either.   [facility].  90 day scripts.

## 2018-11-11 NOTE — Discharge Instructions (Signed)
The pain you are experiencing is likely due to muscle strain, you may take Ibuprofen and Robaxin as needed for pain management. Do not combine with any pain reliever other than tylenol.  You may also use ice and heat, and over-the-counter remedies such as Biofreeze gel or salon pas lidocaine patches. The muscle soreness should improve over the next week. Follow up with your family doctor in the next week for a recheck if you are still having symptoms. Return to ED if pain is worsening, you develop weakness or numbness of extremities, or new or concerning symptoms develop.  

## 2018-11-11 NOTE — Telephone Encounter (Signed)
Copied from [place] [phone]. Topic: General - Other >> Nov 11, 2018  1:22 PM Keene Breath wrote: Reason for CRM: Patient called to ask the nurse to call him regarding his medications.  He stated that his insurance has changed and he needs to discuss which medications are covered.  Patient stated that he called before to have someone call him back but has not heard from the doctor.  CB# [phone].

## 2018-11-14 NOTE — Telephone Encounter (Signed)
Ok for all refills - to [facility]  Not sure if he is asking for xanax, as this is not normally sent to the [facility]

## 2018-11-15 MED ORDER — TAMSULOSIN HCL 0.4 MG PO CAPS: 0.4000 mg | ORAL_CAPSULE | Freq: Every day | ORAL | 1 refills | Status: DC

## 2018-11-15 MED ORDER — FLUOXETINE HCL 40 MG PO CAPS: 40.0000 mg | ORAL_CAPSULE | Freq: Every day | ORAL | 1 refills | Status: DC

## 2018-11-15 MED ORDER — ROSUVASTATIN CALCIUM 10 MG PO TABS: 10.0000 mg | ORAL_TABLET | Freq: Every day | ORAL | 1 refills | Status: DC

## 2018-11-15 MED ORDER — METFORMIN HCL ER 500 MG PO TB24: ORAL_TABLET | ORAL | 1 refills | Status: DC

## 2018-11-15 MED ORDER — RAMIPRIL 10 MG PO CAPS: ORAL_CAPSULE | ORAL | 1 refills | Status: DC

## 2018-11-15 NOTE — Addendum Note (Signed)
Addended by: Juliet Rude on: 11/15/2018 07:30 AM   Modules accepted: Orders

## 2018-11-27 ENCOUNTER — Other Ambulatory Visit: Payer: Self-pay | Admitting: Internal Medicine

## 2018-11-28 NOTE — Telephone Encounter (Signed)
Done erx 

## 2018-12-23 ENCOUNTER — Ambulatory Visit (INDEPENDENT_AMBULATORY_CARE_PROVIDER_SITE_OTHER): Payer: Self-pay | Admitting: Psychiatry

## 2018-12-23 ENCOUNTER — Encounter: Payer: Self-pay | Admitting: Psychiatry

## 2018-12-23 ENCOUNTER — Other Ambulatory Visit: Payer: Self-pay

## 2018-12-23 DIAGNOSIS — F410 Panic disorder [episodic paroxysmal anxiety] without agoraphobia: Secondary | ICD-10-CM

## 2018-12-23 DIAGNOSIS — F411 Generalized anxiety disorder: Secondary | ICD-10-CM

## 2018-12-23 DIAGNOSIS — F422 Mixed obsessional thoughts and acts: Secondary | ICD-10-CM

## 2018-12-23 NOTE — Progress Notes (Signed)
Jacob [name] [id number] 08-09-1965 53 y.o.  Subjective:   Patient ID:  Jacob [name] is a 53 y.o. (DOB 1965-06-12) male.  Chief Complaint:  Chief Complaint  Patient presents with  . Follow-up    Anxiety    HPI Jacob [name] presents to the office today for follow-up of anxiety. "My anxiety has been pretty good." He reports that he noticed some slight panic s/s this morning for the first time. Denies any recent full blown panic attacks. He reports that he has been using prayer to help manage anxiety. He reports that he worries significantly less than he did compared to the past. He reports occ intrusive thoughts- "I don't stress over them like I used to." Denies persistent depressed mood. Reports sleep improved with moving back to first shift. Appetite has been "ok." Notices less cravings for certain foods, like hamburger. He reports that his energy and motivation are ok. He reports that he feels tired frequently and attributes this to working 50-60 hours a week on average. Denies SI.   He reports that he notices some difficulty with concentration, focus, and memory.   He reports drinking wine once a week and thinks he drinks more and then feels guilty.   Jacob [name] moved to [place] and misses seeing him. Has not been able to see grandmother in over 6 months since she is in SNF. Was initially working 3rd shift and moved to 1st shift about a month ago.   Reports taking Xanax 0.5 mg 1/2-3/4 of a tab most nights.   Psychiatric Medication Trials: Sertraline- mild tremor.Affective dulling Prozac. Xanax- Has taken since he was 43 or 53 yo. Ambien- parasomnia  Review of Systems:  Review of Systems  Musculoskeletal: Positive for arthralgias and back pain. Negative for gait problem.  Neurological: Negative for tremors.  Psychiatric/Behavioral:       Please refer to HPI    Medications: I have reviewed the patient's current medications.  Current Outpatient Medications  Medication Sig  Dispense Refill  . ALPRAZolam (XANAX) 0.5 MG tablet TAKE 1 TO 2 TABLETS BY MOUTH AT BEDTIME AS NEEDED 60 tablet 5  . aspirin EC 81 MG tablet Take 1 tablet (81 mg total) by mouth daily. 90 tablet 11  . FLUoxetine (PROZAC) 40 MG capsule Take 1 capsule (40 mg total) by mouth daily. 90 capsule 1  . metFORMIN (GLUCOPHAGE-XR) 500 MG 24 hr tablet TAKE 3 TABLETS BY MOUTH ONCE DAILY WITH BREAKFAST 270 tablet 1  . Multiple Vitamin (MULTIVITAMIN WITH MINERALS) TABS tablet Take 1 tablet by mouth daily.    . naproxen (NAPROSYN) 500 MG tablet Take 500 mg by mouth as needed.   1  . Omega-3 Fatty Acids (FISH OIL PO) Take 1 capsule by mouth daily.    . pantoprazole (PROTONIX) 40 MG tablet TAKE 1 TABLET BY MOUTH EVERY DAY 90 tablet 3  . ramipril (ALTACE) 10 MG capsule TAKE 1 CAPSULE BY MOUTH EVERY DAY 90 capsule 1  . rosuvastatin (CRESTOR) 10 MG tablet Take 1 tablet (10 mg total) by mouth daily. 90 tablet 1  . triamcinolone (NASACORT) 55 MCG/ACT AERO nasal inhaler Place 2 sprays into the nose daily. 1 Inhaler 12  . methocarbamol (ROBAXIN) 500 MG tablet Take 1 tablet (500 mg total) by mouth 2 (two) times daily. (Patient not taking: Reported on 12/23/2018) 20 tablet 0  . tadalafil (CIALIS) 20 MG tablet Take 1 tablet (20 mg total) by mouth daily as needed for erectile dysfunction. 10 tablet  11  . tamsulosin (FLOMAX) 0.4 MG CAPS capsule Take 1 capsule (0.4 mg total) by mouth daily. (Patient not taking: Reported on 12/23/2018) 90 capsule 1   No current facility-administered medications for this visit.     Medication Side Effects: Other: Possible increased memory difficulties  Allergies: No Known Allergies  Past Medical History:  Diagnosis Date  . Allergic rhinitis   . Anxiety   . Arthritis   . Cholesteatoma of right ear   . 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 Dysuria   . Enlarged prostate    pt unaware  . GERD (gastroesophageal reflux disease)   . Headache   . Hearing loss    Right ear  . Hernia of  abdominal wall   . History of gross hematuria   . History of head injury   . History of kidney stones   . History of palpitations   . Hyperlipidemia 10/18/2014  . Hypertension   . Insomnia   . Lack of concentration   . Nasal fracture    Several  . Otitis media 11/10/2017   Bilateral  . Panic attacks   . Syncope   . Vertigo     Family History  Problem Relation Age of Onset  . COPD Mother   . Hypertension Mother   . Anxiety disorder Mother   . Heart disease Father   . Cancer Father   . Diabetes Paternal Grandmother   . Diabetes Maternal Grandmother   . Anxiety disorder Maternal Grandmother   . Heart attack Brother   . Lung cancer Maternal Uncle   . Anxiety disorder Maternal Uncle   . Anxiety disorder Maternal Aunt   . OCD Maternal Aunt   . Anxiety disorder Cousin   . ADD / ADHD Son   . Drug abuse Son   . Colon cancer Neg Hx   . Esophageal cancer Neg Hx   . Rectal cancer Neg Hx   . Stomach cancer Neg Hx     Social History   Socioeconomic History  . Marital status: Single    Spouse name: Not on file  . Number of children: 3  . Years of education: Not on file  . Highest education level: Not on file  Occupational History  . Occupation: Music therapist: [employer]  . Financial resource strain: Not on file  . Food insecurity    Worry: Not on file    Inability: Not on file  . Transportation needs    Medical: Not on file    Non-medical: Not on file  Tobacco Use  . Smoking status: Never Smoker  . Smokeless tobacco: Never Used  Substance and Sexual Activity  . Alcohol use: Yes    Alcohol/week: 5.0 standard drinks    Types: 5 Glasses of wine per week    Comment: weekends  . Drug use: No  . Sexual activity: Yes    Partners: Female    Birth control/protection: None  Lifestyle  . Physical activity    Days per week: Not on file    Minutes per session: Not on file  . Stress: Not on file  Relationships  . Social Herbalist  on phone: Not on file    Gets together: Not on file    Attends religious service: Not on file    Active member of club or organization: Not on file    Attends meetings of clubs or organizations: Not on file  Relationship status: Not on file  . Intimate partner violence    Fear of current or ex partner: Not on file    Emotionally abused: Not on file    Physically abused: Not on file    Forced sexual activity: Not on file  Other Topics Concern  . Not on file  Social History Narrative  . Not on file    Past Medical History, Surgical history, Social history, and Family history were reviewed and updated as appropriate.   Please see review of systems for further details on the patient's review from today.   Objective:   Physical Exam:  There were no vitals taken for this visit.  Physical Exam Constitutional:      General: He is not in acute distress.    Appearance: He is well-developed.  Musculoskeletal:        General: No deformity.  Neurological:     Mental Status: He is alert and oriented to person, place, and time.     Coordination: Coordination normal.  Psychiatric:        Attention and Perception: Attention and perception normal. He does not perceive auditory or visual hallucinations.        Mood and Affect: Mood is not depressed. Affect is not labile, blunt, angry or inappropriate.        Speech: Speech normal.        Behavior: Behavior normal.        Thought Content: Thought content normal. Thought content does not include homicidal or suicidal ideation. Thought content does not include homicidal or suicidal plan.        Cognition and Memory: Cognition and memory normal.        Judgment: Judgment normal.     Comments: Mood presents as less anxious compared to past exams. Less obsessive thoughts and rumination noted.  Insight intact. No delusions.      Lab Review:     Component Value Date/Time   NA 139 08/10/2018 0825   K 4.5 08/10/2018 0825   CL 104  08/10/2018 0825   CO2 27 08/10/2018 0825   GLUCOSE 147 (H) 08/10/2018 0825   BUN 12 08/10/2018 0825   CREATININE 1.07 08/10/2018 0825   CREATININE 1.37 (H) 10/07/2014 1202   CALCIUM 9.2 08/10/2018 0825   PROT 7.1 08/10/2018 0825   ALBUMIN 4.5 08/10/2018 0825   AST 21 08/10/2018 0825   ALT 24 08/10/2018 0825   ALKPHOS 65 08/10/2018 0825   BILITOT 0.6 08/10/2018 0825   GFRNONAA >60 05/10/2018 0545   GFRNONAA 61 10/07/2014 1202   GFRAA >60 05/10/2018 0545   GFRAA 70 10/07/2014 1202       Component Value Date/Time   WBC 4.4 08/10/2018 0825   RBC 5.28 08/10/2018 0825   HGB 16.0 08/10/2018 0825   HCT 46.4 08/10/2018 0825   PLT 215.0 08/10/2018 0825   MCV 87.8 08/10/2018 0825   MCH 29.9 12/11/2017 0958   MCHC 34.5 08/10/2018 0825   RDW 13.8 08/10/2018 0825   LYMPHSABS 1.2 08/10/2018 0825   MONOABS 0.3 08/10/2018 0825   EOSABS 0.1 08/10/2018 0825   BASOSABS 0.0 08/10/2018 0825    No results found for: POCLITH, LITHIUM   No results found for: PHENYTOIN, PHENOBARB, VALPROATE, CBMZ   .res Assessment: Plan:   Will continue Prozac 40 mg daily and Xanax.  Discussed that it appears PCP recently sent orders for both medications.  Advised patient to contact office if he needs an additional prescription prior to next  visit. Patient to follow-up in 6 months or sooner if clinically indicated. Patient advised to contact office with any questions, adverse effects, or acute worsening in signs and symptoms.  Bijon was seen today for follow-up.  Diagnoses and all orders for this visit:  Mixed obsessional thoughts and acts  Panic disorder  Generalized anxiety disorder     Please see After Visit Summary for patient specific instructions.  Future Appointments  Date Time Provider [place]  06/23/2019  1:00 PM Thayer Headings, PMHNP CP-CP None    No orders of the defined types were placed in this encounter.   -------------------------------

## 2018-12-25 ENCOUNTER — Other Ambulatory Visit: Payer: Self-pay | Admitting: Internal Medicine

## 2018-12-27 ENCOUNTER — Other Ambulatory Visit: Payer: Self-pay | Admitting: Psychiatry

## 2018-12-27 DIAGNOSIS — F422 Mixed obsessional thoughts and acts: Secondary | ICD-10-CM

## 2018-12-27 DIAGNOSIS — F411 Generalized anxiety disorder: Secondary | ICD-10-CM

## 2018-12-27 DIAGNOSIS — F410 Panic disorder [episodic paroxysmal anxiety] without agoraphobia: Secondary | ICD-10-CM

## 2018-12-27 NOTE — Telephone Encounter (Signed)
Pharmacy

## 2018-12-28 NOTE — Telephone Encounter (Signed)
Jacob [name], can you clarify his pharmacy, looks like this was submitted already to another pharmacy

## 2018-12-29 NOTE — Telephone Encounter (Signed)
He uses [facility]. Please disregard.

## 2018-12-29 NOTE — Telephone Encounter (Signed)
Left Pt. A VM to return my call.

## 2019-01-06 ENCOUNTER — Other Ambulatory Visit: Payer: Self-pay

## 2019-01-06 ENCOUNTER — Ambulatory Visit (INDEPENDENT_AMBULATORY_CARE_PROVIDER_SITE_OTHER): Payer: Self-pay | Admitting: Psychiatry

## 2019-01-06 ENCOUNTER — Encounter: Payer: Self-pay | Admitting: Psychiatry

## 2019-01-06 DIAGNOSIS — F422 Mixed obsessional thoughts and acts: Secondary | ICD-10-CM

## 2019-01-06 DIAGNOSIS — F410 Panic disorder [episodic paroxysmal anxiety] without agoraphobia: Secondary | ICD-10-CM

## 2019-01-06 DIAGNOSIS — F411 Generalized anxiety disorder: Secondary | ICD-10-CM

## 2019-01-06 NOTE — Progress Notes (Signed)
Jacob [name] [id number] [birth year] 53 y.o.  Virtual Visit via Telephone Note  I connected with pt on 01/06/19 at 10:00 AM EDT by telephone and verified that I am speaking with the correct person using two identifiers.   I discussed the limitations, risks, security and privacy concerns of performing an evaluation and management service by telephone and the availability of in person appointments. I also discussed with the patient that there may be a patient responsible charge related to this service. The patient expressed understanding and agreed to proceed.   I discussed the assessment and treatment plan with the patient. The patient was provided an opportunity to ask questions and all were answered. The patient agreed with the plan and demonstrated an understanding of the instructions.   The patient was advised to call back or seek an in-person evaluation if the symptoms worsen or if the condition fails to improve as anticipated.  I provided 20 minutes of non-face-to-face time during this encounter.  The patient was located at home.  The provider was located at [place] [facility].   Thayer Headings, PMHNP   Subjective:   Patient ID:  Jacob [name] is a 53 y.o. (DOB 06/10/1965) male.  Chief Complaint:  Chief Complaint  Patient presents with  . Anxiety    HPI Jacob [name] presents for follow-up of anxiety. He reports that he had some recent increased stressors. He reports that his anxiety had been well controlled and he was wondering if he continued to need Prozac. He reports that he was "skipping days" of Prozac and felt confused. He reports that he had difficulty making decisions and was frequently changing his mind. He reports that he noticed some "tingling" and mild HA's. He reports that he had a severe panic attack recently and called 911 and felt better by the time EMS arrived. He reports that he also experienced some depression. He reports that he has resumed previous dose  of Prozac 40 mg po qd three days ago and anxiety has seemed to improve. He reports that his mood is no longer as depressed.  No longer experiencing tingling and HA's. He reports feelings of guilt r/t indecisiveness. He reports occ difficulty with concentration and ST memory. He reports adequate sleep. He reports that he is frequently tired and attributes this to working long hours. Appetite is stable. Continues to report changes in food preferences and food cravings. Denies SI.   Has started some pastoral counseling and is able to continue this.   Psychiatric Medication Trials: Sertraline- mild tremor.Affective dulling Prozac. Xanax- Has taken since he was 25 or 53 yo. Ambien- parasomnia   Review of Systems:  Review of Systems  Endocrine:       Recent hyperglycemia.   Musculoskeletal: Positive for arthralgias and back pain. Negative for gait problem.  Neurological: Negative for tremors.  Psychiatric/Behavioral:       Please refer to HPI    Medications: I have reviewed the patient's current medications.  Current Outpatient Medications  Medication Sig Dispense Refill  . ALPRAZolam (XANAX) 0.5 MG tablet TAKE 1 TO 2 TABLETS BY MOUTH AT BEDTIME AS NEEDED 60 tablet 5  . aspirin EC 81 MG tablet Take 1 tablet (81 mg total) by mouth daily. 90 tablet 11  . FLUoxetine (PROZAC) 40 MG capsule Take 1 capsule (40 mg total) by mouth daily. 90 capsule 1  . metFORMIN (GLUCOPHAGE-XR) 500 MG 24 hr tablet TAKE 3 TABLETS BY MOUTH ONCE DAILY WITH BREAKFAST 270 tablet  1  . methocarbamol (ROBAXIN) 500 MG tablet Take 1 tablet (500 mg total) by mouth 2 (two) times daily. (Patient not taking: Reported on 12/23/2018) 20 tablet 0  . Multiple Vitamin (MULTIVITAMIN WITH MINERALS) TABS tablet Take 1 tablet by mouth daily.    . naproxen (NAPROSYN) 500 MG tablet Take 500 mg by mouth as needed.   1  . Omega-3 Fatty Acids (FISH OIL PO) Take 1 capsule by mouth daily.    . pantoprazole (PROTONIX) 40 MG tablet TAKE 1  TABLET BY MOUTH EVERY DAY 90 tablet 3  . ramipril (ALTACE) 10 MG capsule TAKE 1 CAPSULE BY MOUTH EVERY DAY 90 capsule 0  . rosuvastatin (CRESTOR) 10 MG tablet Take 1 tablet (10 mg total) by mouth daily. 90 tablet 1  . tadalafil (CIALIS) 20 MG tablet Take 1 tablet (20 mg total) by mouth daily as needed for erectile dysfunction. 10 tablet 11  . tamsulosin (FLOMAX) 0.4 MG CAPS capsule Take 1 capsule (0.4 mg total) by mouth daily. (Patient not taking: Reported on 12/23/2018) 90 capsule 1  . triamcinolone (NASACORT) 55 MCG/ACT AERO nasal inhaler Place 2 sprays into the nose daily. 1 Inhaler 12   No current facility-administered medications for this visit.     Medication Side Effects: Other: Changes in food preferences  Allergies: No Known Allergies  Past Medical History:  Diagnosis Date  . Allergic rhinitis   . Anxiety   . Arthritis   . Cholesteatoma of right ear   . 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 Dysuria   . Enlarged prostate    pt unaware  . GERD (gastroesophageal reflux disease)   . Headache   . Hearing loss    Right ear  . Hernia of abdominal wall   . History of gross hematuria   . History of head injury   . History of kidney stones   . History of palpitations   . Hyperlipidemia 10/18/2014  . Hypertension   . Insomnia   . Lack of concentration   . Nasal fracture    Several  . Otitis media 11/10/2017   Bilateral  . Panic attacks   . Syncope   . Vertigo     Family History  Problem Relation Age of Onset  . COPD Mother   . Hypertension Mother   . Anxiety disorder Mother   . Heart disease Father   . Cancer Father   . Diabetes Paternal Grandmother   . Diabetes Maternal Grandmother   . Anxiety disorder Maternal Grandmother   . Heart attack Brother   . Lung cancer Maternal Uncle   . Anxiety disorder Maternal Uncle   . Anxiety disorder Maternal Aunt   . OCD Maternal Aunt   . Anxiety disorder Cousin   . ADD / ADHD Son   . Drug abuse Son   . Colon cancer  Neg Hx   . Esophageal cancer Neg Hx   . Rectal cancer Neg Hx   . Stomach cancer Neg Hx     Social History   Socioeconomic History  . Marital status: Single    Spouse name: Not on file  . Number of children: 3  . Years of education: Not on file  . Highest education level: Not on file  Occupational History  . Occupation: Music therapist: [employer]  . Financial resource strain: Not on file  . Food insecurity    Worry: Not on file    Inability: Not  on file  . Transportation needs    Medical: Not on file    Non-medical: Not on file  Tobacco Use  . Smoking status: Never Smoker  . Smokeless tobacco: Never Used  Substance and Sexual Activity  . Alcohol use: Yes    Alcohol/week: 5.0 standard drinks    Types: 5 Glasses of wine per week    Comment: weekends  . Drug use: No  . Sexual activity: Yes    Partners: Female    Birth control/protection: None  Lifestyle  . Physical activity    Days per week: Not on file    Minutes per session: Not on file  . Stress: Not on file  Relationships  . Social Herbalist on phone: Not on file    Gets together: Not on file    Attends religious service: Not on file    Active member of club or organization: Not on file    Attends meetings of clubs or organizations: Not on file    Relationship status: Not on file  . Intimate partner violence    Fear of current or ex partner: Not on file    Emotionally abused: Not on file    Physically abused: Not on file    Forced sexual activity: Not on file  Other Topics Concern  . Not on file  Social History Narrative  . Not on file    Past Medical History, Surgical history, Social history, and Family history were reviewed and updated as appropriate.   Please see review of systems for further details on the patient's review from today.   Objective:   Physical Exam:  There were no vitals taken for this visit.  Physical Exam Constitutional:      General: He is  not in acute distress.    Appearance: He is well-developed.  Musculoskeletal:        General: No deformity.  Neurological:     Mental Status: He is alert and oriented to person, place, and time.     Coordination: Coordination normal.  Psychiatric:        Attention and Perception: Attention and perception normal. He does not perceive auditory or visual hallucinations.        Mood and Affect: Mood is anxious. Mood is not depressed. Affect is not labile, blunt, angry or inappropriate.        Speech: Speech normal.        Behavior: Behavior normal.        Thought Content: Thought content normal. Thought content is not paranoid or delusional. Thought content does not include homicidal or suicidal ideation. Thought content does not include homicidal or suicidal plan.        Cognition and Memory: Cognition and memory normal.        Judgment: Judgment normal.     Comments: Insight intact. No delusions.      Lab Review:     Component Value Date/Time   NA 139 08/10/2018 0825   K 4.5 08/10/2018 0825   CL 104 08/10/2018 0825   CO2 27 08/10/2018 0825   GLUCOSE 147 (H) 08/10/2018 0825   BUN 12 08/10/2018 0825   CREATININE 1.07 08/10/2018 0825   CREATININE 1.37 (H) 10/07/2014 1202   CALCIUM 9.2 08/10/2018 0825   PROT 7.1 08/10/2018 0825   ALBUMIN 4.5 08/10/2018 0825   AST 21 08/10/2018 0825   ALT 24 08/10/2018 0825   ALKPHOS 65 08/10/2018 0825   BILITOT 0.6 08/10/2018 0825  GFRNONAA >60 05/10/2018 0545   GFRNONAA 61 10/07/2014 1202   GFRAA >60 05/10/2018 0545   GFRAA 70 10/07/2014 1202       Component Value Date/Time   WBC 4.4 08/10/2018 0825   RBC 5.28 08/10/2018 0825   HGB 16.0 08/10/2018 0825   HCT 46.4 08/10/2018 0825   PLT 215.0 08/10/2018 0825   MCV 87.8 08/10/2018 0825   MCH 29.9 12/11/2017 0958   MCHC 34.5 08/10/2018 0825   RDW 13.8 08/10/2018 0825   LYMPHSABS 1.2 08/10/2018 0825   MONOABS 0.3 08/10/2018 0825   EOSABS 0.1 08/10/2018 0825   BASOSABS 0.0 08/10/2018  0825    No results found for: POCLITH, LITHIUM   No results found for: PHENYTOIN, PHENOBARB, VALPROATE, CBMZ   .res Assessment: Plan:   Discussed that patient may have experienced some discontinuation signs and symptoms with taking Prozac less frequently and this could have been because of headaches, tingling, confusion and acute anxiety.  Discussed reducing Xanax even by a small amount could also cause similar signs and symptoms with benzo diazepam withdrawal considering the length of time patient has taken Xanax. Patient reports that he would like to continue current dose of Prozac since he has noticed physical signs and symptoms have resolved and anxiety has improved since he recently resumed previous dose of Prozac.  Patient reports that he has also had other.'s of increased anxiety after dose reduction attempts. Agreed to continue current plan of care. Patient to follow-up as scheduled on March 26 or sooner if clinically indicated. Patient advised to contact office with any questions, adverse effects, or acute worsening in signs and symptoms.  Stevens was seen today for anxiety.  Diagnoses and all orders for this visit:  Mixed obsessional thoughts and acts  Panic disorder  Generalized anxiety disorder    Please see After Visit Summary for patient specific instructions.  Future Appointments  Date Time Provider [employer]  06/23/2019  1:00 PM Thayer Headings, PMHNP CP-CP None    No orders of the defined types were placed in this encounter.     -------------------------------

## 2019-01-15 ENCOUNTER — Other Ambulatory Visit: Payer: Self-pay | Admitting: Internal Medicine

## 2019-03-09 ENCOUNTER — Encounter (HOSPITAL_COMMUNITY): Payer: Self-pay

## 2019-03-09 ENCOUNTER — Other Ambulatory Visit: Payer: Self-pay

## 2019-03-09 ENCOUNTER — Emergency Department (HOSPITAL_COMMUNITY): Payer: Commercial Managed Care - PPO

## 2019-03-09 ENCOUNTER — Emergency Department (HOSPITAL_COMMUNITY)
Admission: EM | Admit: 2019-03-09 | Discharge: 2019-03-10 | Disposition: A | Discharge status: Home / Self Care | Payer: Commercial Managed Care - PPO | Attending: Emergency Medicine | Admitting: Emergency Medicine

## 2019-03-09 DIAGNOSIS — R0602 Shortness of breath: Secondary | ICD-10-CM | POA: Insufficient documentation

## 2019-03-09 DIAGNOSIS — I10 Essential (primary) hypertension: Secondary | ICD-10-CM | POA: Insufficient documentation

## 2019-03-09 DIAGNOSIS — Z7984 Long term (current) use of oral hypoglycemic drugs: Secondary | ICD-10-CM | POA: Insufficient documentation

## 2019-03-09 DIAGNOSIS — R079 Chest pain, unspecified: Secondary | ICD-10-CM | POA: Insufficient documentation

## 2019-03-09 DIAGNOSIS — Z7982 Long term (current) use of aspirin: Secondary | ICD-10-CM | POA: Diagnosis not present

## 2019-03-09 DIAGNOSIS — E119 Type 2 diabetes mellitus without complications: Secondary | ICD-10-CM | POA: Insufficient documentation

## 2019-03-09 DIAGNOSIS — Z79899 Other long term (current) drug therapy: Secondary | ICD-10-CM | POA: Diagnosis not present

## 2019-03-09 LAB — CBC
HCT: 47.9 % (ref 39.0–52.0)
Hemoglobin: 16 g/dL (ref 13.0–17.0)
MCH: 29.7 pg (ref 26.0–34.0)
MCHC: 33.4 g/dL (ref 30.0–36.0)
MCV: 89 fL (ref 80.0–100.0)
Platelets: 220 10*3/uL (ref 150–400)
RBC: 5.38 MIL/uL (ref 4.22–5.81)
RDW: 12.9 % (ref 11.5–15.5)
WBC: 4.8 10*3/uL (ref 4.0–10.5)
nRBC: 0 % (ref 0.0–0.2)

## 2019-03-09 LAB — TROPONIN I (HIGH SENSITIVITY)
Troponin I (High Sensitivity): <2 ng/L (ref <18)
Troponin I (High Sensitivity): <2 ng/L (ref <18)

## 2019-03-09 LAB — BASIC METABOLIC PANEL
Anion gap: 11 (ref 5–15)
BUN: 13 mg/dL (ref 6–20)
CO2: 23 mmol/L (ref 22–32)
Calcium: 9 mg/dL (ref 8.9–10.3)
Chloride: 104 mmol/L (ref 98–111)
Creatinine, Ser: 1.24 mg/dL (ref 0.61–1.24)
GFR calc Af Amer: >60 mL/min (ref >60)
GFR calc non Af Amer: >60 mL/min (ref >60)
Glucose, Bld: 252 mg/dL — ABNORMAL HIGH (ref 70–99)
Potassium: 4.1 mmol/L (ref 3.5–5.1)
Sodium: 138 mmol/L (ref 135–145)

## 2019-03-09 MED ORDER — SODIUM CHLORIDE 0.9% FLUSH: 3.0000 mL | Freq: Once | INTRAVENOUS | Status: DC

## 2019-03-09 NOTE — ED Notes (Signed)
Pt refused IV at this time

## 2019-03-09 NOTE — ED Triage Notes (Signed)
Patient c/o left chest pain that started 3 days ago and again today with SOB. patient states he does have anxiety,but wanted to be sure.  Patient states he took [place] Aspirin x 2 at 1240 today.

## 2019-03-09 NOTE — ED Provider Notes (Signed)
[facility] DEPT Provider Note   CSN: [id number] Arrival date & time: 03/09/19  1253     History Chief Complaint  Patient presents with  . Chest Pain  . Shortness of Breath    [name] is a 53 y.o. male.  53 year old male with history of anxiety presents with left-sided sharp pinpoint chest discomfort which is been nonradiating.  No associated dyspnea or diaphoresis.  No nausea or vomiting.  No recent cough or fever.  Had episode several days ago and had EKG and was felt to be okay.  Denies any history of CAD.  Patient has had no leg pain or swelling.  Symptoms resolve spontaneously after lasting for approximately 5 minutes.        Past Medical History:  Diagnosis Date  . Allergic rhinitis   . Anxiety   . Arthritis   . Cholesteatoma of right ear   . Diabetes mellitus ([place])    bordreline  . Dysuria   . Enlarged prostate    pt unaware  . GERD (gastroesophageal reflux disease)   . Headache   . Hearing loss    Right ear  . Hernia of abdominal wall   . History of gross hematuria   . History of head injury   . History of kidney stones   . History of palpitations   . Hyperlipidemia 10/18/2014  . Hypertension   . Insomnia   . Lack of concentration   . Nasal fracture    Several  . Otitis media 11/10/2017   Bilateral  . Panic attacks   . Syncope   . Vertigo     Patient Active Problem List   Diagnosis Date Noted  . Sleep disorder 08/23/2018  . Memory loss 08/11/2018  . Dysuria 04/22/2018  . Gross hematuria 04/22/2018  . STD exposure 04/22/2018  . Lack of concentration 12/21/2017  . Panic attacks 12/21/2017  . Obsessional thoughts 12/21/2017  . Acute sinus infection 06/11/2017  . Colon cancer screening 04/09/2017  . Nausea without vomiting 04/09/2017  . Dysphagia 04/09/2017  . Dyspepsia 11/19/2016  . Right otitis media 09/09/2016  . Vertigo 09/09/2016  . Hearing loss, right 09/09/2016  . BPH (benign prostatic  hyperplasia) 05/30/2016  . Right shoulder pain 05/30/2016  . Acute midline low back pain with sciatica 04/21/2016  . 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 04/21/2016  . Urinary frequency 02/05/2016  . Syncope 09/27/2015  . Left lateral epicondylitis 08/31/2015  . Allergic rhinitis 08/29/2015  . Bilateral hearing loss 06/05/2015  . Hypersomnolence 06/05/2015  . Chest pain 01/23/2015  . Chronic pain syndrome 01/23/2015  . Hyperlipidemia 10/18/2014  . Impingement syndrome of right shoulder 06/28/2014  . Hip flexor tendon tightness 06/28/2014  . Right knee pain 06/28/2014  . Depression with anxiety 01/30/2013  . Diabetes mellitus without complication ([place]) 0000000  . Encounter for well adult exam with abnormal findings 01/11/2013  . Essential hypertension, benign 01/11/2013  . Low back pain syndrome 01/11/2013  . DJD (degenerative joint disease) 01/11/2013  . Erectile dysfunction 01/11/2013  . GERD (gastroesophageal reflux disease) 12/30/2011    Past Surgical History:  Procedure Laterality Date  . CHOLECYSTECTOMY    . CYSTOSCOPY N/A 05/10/2018   Procedure: CYSTOSCOPY WITH EXAM UNDER ANESTHESIA;  Surgeon: Ceasar Mons, MD;  Location: [facility];  Service: Urology;  Laterality: N/A;  ONLY NEEDS 30 MIN  . [place]  . INNER EAR SURGERY     right  .  LUMBAR DISC SURGERY    . MASTOIDECTOMY Right   . NOSE SURGERY     Fracture  . tubes in bil ears    . UPPER GI ENDOSCOPY  01/09/2012   Esophageal dilation       Family History  Problem Relation Age of Onset  . COPD Mother   . Hypertension Mother   . Anxiety disorder Mother   . Heart disease Father   . Cancer Father   . Diabetes Paternal Grandmother   . Diabetes Maternal Grandmother   . Anxiety disorder Maternal Grandmother   . Heart attack Brother   . Lung cancer Maternal Uncle   . Anxiety disorder Maternal Uncle   . Anxiety disorder Maternal Aunt   . OCD  Maternal Aunt   . Anxiety disorder Cousin   . ADD / ADHD Son   . Drug abuse Son   . Colon cancer Neg Hx   . Esophageal cancer Neg Hx   . Rectal cancer Neg Hx   . Stomach cancer Neg Hx     Social History   Tobacco Use  . Smoking status: Never Smoker  . Smokeless tobacco: Never Used  Substance Use Topics  . Alcohol use: Yes    Alcohol/week: 5.0 standard drinks    Types: 5 Glasses of wine per week    Comment: weekends  . Drug use: No    Home Medications Prior to Admission medications   Medication Sig Start Date End Date Taking? Authorizing Provider  ALPRAZolam Duanne Moron) 0.5 MG tablet TAKE 1 TO 2 TABLETS BY MOUTH AT BEDTIME AS NEEDED 11/28/18   Biagio Borg, MD  aspirin EC 81 MG tablet Take 1 tablet (81 mg total) by mouth daily. 05/09/14   Biagio Borg, MD  CIALIS 20 MG tablet TAKE 1 TABLET BY MOUTH EVERY DAY AS NEEDED 01/16/19   Biagio Borg, MD  FLUoxetine (PROZAC) 40 MG capsule Take 1 capsule (40 mg total) by mouth daily. 11/15/18 02/13/19  Biagio Borg, MD  metFORMIN (GLUCOPHAGE-XR) 500 MG 24 hr tablet TAKE 3 TABLETS BY MOUTH ONCE DAILY WITH BREAKFAST 11/15/18   Biagio Borg, MD  methocarbamol (ROBAXIN) 500 MG tablet Take 1 tablet (500 mg total) by mouth 2 (two) times daily. Patient not taking: Reported on 12/23/2018 11/11/18   Jacqlyn Larsen, PA-C  Multiple Vitamin (MULTIVITAMIN WITH MINERALS) TABS tablet Take 1 tablet by mouth daily.    [provider]  naproxen (NAPROSYN) 500 MG tablet Take 500 mg by mouth as needed.  09/18/17   [provider]  Omega-3 Fatty Acids (FISH OIL PO) Take 1 capsule by mouth daily.    [provider]  pantoprazole (PROTONIX) 40 MG tablet TAKE 1 TABLET BY MOUTH EVERY DAY 11/04/18   Biagio Borg, MD  ramipril (ALTACE) 10 MG capsule TAKE 1 CAPSULE BY MOUTH EVERY DAY 12/26/18   Biagio Borg, MD  rosuvastatin (CRESTOR) 10 MG tablet Take 1 tablet (10 mg total) by mouth daily. 11/15/18   Biagio Borg, MD  tamsulosin (FLOMAX) 0.4 MG  CAPS capsule Take 1 capsule (0.4 mg total) by mouth daily. Patient not taking: Reported on 12/23/2018 11/15/18   Biagio Borg, MD  triamcinolone (NASACORT) 55 MCG/ACT AERO nasal inhaler Place 2 sprays into the nose daily. 08/08/18   Biagio Borg, MD    Allergies    Patient has no known allergies.  Review of Systems   Review of Systems  All other systems reviewed and  are negative.   Physical Exam Updated Vital Signs BP 140/89   Pulse 73   Temp 98.5 F (36.9 C) (Oral)   Resp 15   Ht 1.829 m (6')   Wt 83.9 kg   SpO2 97%   BMI 25.09 kg/m   Physical Exam Vitals and nursing note reviewed.  Constitutional:      General: He is not in acute distress.    Appearance: Normal appearance. He is well-developed. He is not toxic-appearing.  HENT:     Head: Normocephalic and atraumatic.  Eyes:     General: Lids are normal.     Conjunctiva/sclera: Conjunctivae normal.     Pupils: Pupils are equal, round, and reactive to light.  Neck:     Thyroid: No thyroid mass.     Trachea: No tracheal deviation.  Cardiovascular:     Rate and Rhythm: Normal rate and regular rhythm.     Heart sounds: Normal heart sounds. No murmur. No gallop.   Pulmonary:     Effort: Pulmonary effort is normal. No respiratory distress.     Breath sounds: Normal breath sounds. No stridor. No decreased breath sounds, wheezing, rhonchi or rales.  Abdominal:     General: Bowel sounds are normal. There is no distension.     Palpations: Abdomen is soft.     Tenderness: There is no abdominal tenderness. There is no rebound.  Musculoskeletal:        General: No tenderness. Normal range of motion.     Cervical back: Normal range of motion and neck supple.  Skin:    General: Skin is warm and dry.     Findings: No abrasion or rash.  Neurological:     Mental Status: He is alert and oriented to person, place, and time.     GCS: GCS eye subscore is 4. GCS verbal subscore is 5. GCS motor subscore is 6.     Cranial Nerves: No  cranial nerve deficit.     Sensory: No sensory deficit.  Psychiatric:        Speech: Speech normal.        Behavior: Behavior normal.     ED Results / Procedures / Treatments   Labs (all labs ordered are listed, but only abnormal results are displayed) Labs Reviewed  BASIC METABOLIC PANEL - Abnormal; Notable for the following components:      Result Value   Glucose, Bld 252 (*)    All other components within normal limits  CBC  TROPONIN I (HIGH SENSITIVITY)  TROPONIN I (HIGH SENSITIVITY)    EKG EKG Interpretation  Date/Time:  Thursday March 09 2019 13:05:25 EST Ventricular Rate:  90 PR Interval:    QRS Duration: 82 QT Interval:  434 QTC Calculation: 532 R Axis:   69 Text Interpretation: Sinus rhythm Prolonged QT interval Baseline wander in lead(s) I V3 No significant change since last tracing Confirmed by Lacretia Leigh ([id number]) on 03/09/2019 4:01:22 PM   Radiology DG Chest 2 View  Result Date: 03/09/2019 CLINICAL DATA:  Chest pain. EXAM: CHEST - 2 VIEW COMPARISON:  06/03/2017. FINDINGS: Mediastinum and hilar structures normal. Lungs are clear. No pleural effusion or pneumothorax. Heart size normal. Degenerative change thoracic spine. IMPRESSION: No acute cardiopulmonary disease. Electronically Signed   By: Marcello Moores  Register   On: 03/09/2019 13:50    Procedures Procedures (including critical care time)  Medications Ordered in ED Medications  sodium chloride flush (NS) 0.9 % injection 3 mL (0 mLs Intravenous Hold 03/09/19 1534)  ED Course  I have reviewed the triage vital signs and the nursing notes.  Pertinent labs & imaging results that were available during my care of the patient were reviewed by me and considered in my medical decision making (see chart for details).    MDM Rules/Calculators/A&P     CHA2DS2/VAS Stroke Risk Points      N/A >= 2 Points: High Risk  1 - 1.99 Points: Medium Risk  0 Points: Low Risk    A final score could not be computed  because of missing components.: Last  Change: N/A     This score determines the patient's risk of having a stroke if the  patient has atrial fibrillation.      This score is not applicable to this patient. Components are not  calculated.                   Patient is delta troponin here is negative.  EKG without acute ischemic changes.  Labs reassuring.  Suspect anxiety and will discharge home. Final Clinical Impression(s) / ED Diagnoses Final diagnoses:  None    Rx / DC Orders ED Discharge Orders    None       Lacretia Leigh, MD 03/09/19 1700

## 2019-03-09 NOTE — ED Notes (Signed)
An After Visit Summary was printed and given to the patient. Discharge instructions given and no further questions at this time. Pt denies chest pain at this time. Pt ambulatory with no difficulty at discharge.

## 2019-04-21 ENCOUNTER — Other Ambulatory Visit: Payer: Self-pay | Admitting: Internal Medicine

## 2019-04-21 DIAGNOSIS — E119 Type 2 diabetes mellitus without complications: Secondary | ICD-10-CM

## 2019-05-11 ENCOUNTER — Other Ambulatory Visit: Payer: Self-pay

## 2019-05-11 ENCOUNTER — Ambulatory Visit: Payer: Self-pay

## 2019-05-11 ENCOUNTER — Encounter: Payer: Self-pay | Admitting: Family Medicine

## 2019-05-11 ENCOUNTER — Ambulatory Visit: Payer: BLUE CROSS/BLUE SHIELD | Admitting: Family Medicine

## 2019-05-11 VITALS — BP 120/68 | HR 67 | Ht 72.0 in | Wt 194.8 lb

## 2019-05-11 DIAGNOSIS — G8929 Other chronic pain: Secondary | ICD-10-CM

## 2019-05-11 DIAGNOSIS — M25511 Pain in right shoulder: Secondary | ICD-10-CM | POA: Diagnosis not present

## 2019-05-11 NOTE — Progress Notes (Signed)
I, Wendy Poet, LAT, ATC, am serving as scribe for Dr. [name].  Jacob [name] is a 54 y.o. male who presents to [place] [facility] today for R shoulder pain.  He was last seen by Dr. Tamala Julian on 06/28/14 for R shoulder pain.  He has a hx of a partial supraspinatus tear and posterior, superior labral tear.  He has tried PT in the past w/ little success.  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  He rates his current pain at a 5/10.  He denies any radiating pain into the RUE.  Aggravating factors include R shoulder aBd/ER; heavy pulling motions and laying on his R side.   Pertinent review of systems: No fevers or chills.  Relevant historical information: History hypertension and diabetes   Exam:  BP 120/68 (BP Location: Left Arm, Patient Position: Sitting, Cuff Size: Large)   Pulse 67   Ht 6' (1.829 m)   Wt 194 lb 12.8 oz (88.4 kg)   SpO2 97%   BMI 26.42 kg/m  General: Well Developed, well nourished, and in no acute distress.   MSK:  Right shoulder: Normal-appearing Nontender. Range of motion: Abduction 120 degrees active, full passive. External rotation full. Internal rotation to lumbar spine. Intact strength abduction external and internal rotation. Positive Hawkins and Neer's test.  Positive empty can test. Negative Yergason's and speeds test.  Left shoulder: Normal-appearing normal motion nontender normal strength negative impingement testing.  Pulses cap refill and sensation intact distally.    Lab and Radiology Results  Procedure: Real-time Ultrasound Guided Injection of right shoulder subacromial bursa Device: Philips Affiniti 50G Images permanently stored and available for review in the ultrasound unit. Verbal informed consent obtained.  Discussed risks and benefits of procedure. Warned about infection bleeding damage  to structures skin hypopigmentation and fat atrophy among others. Patient expresses understanding and agreement Time-out conducted.   Noted no overlying erythema, induration, or other signs of local infection.   Skin prepped in a sterile fashion.   Subacromial bursa increased thickness seen on ultrasound examination. Local anesthesia: Topical Ethyl chloride.   With sterile technique and under real time ultrasound guidance:  40 mg of Kenalog and 2 mL of Marcaine injected easily.   Completed without difficulty   Pain immediately resolved suggesting accurate placement of the medication.   Advised to call if fevers/chills, erythema, induration, drainage, or persistent bleeding.   Images permanently stored and available for review in the ultrasound unit.  Impression: Technically successful ultrasound guided injection.         Assessment and Plan: 54 y.o. male with right shoulder pain due to subacromial bursitis/shoulder impingement.  Plan for injection as above.  Additionally will recommend Voltaren gel and home exercise program.  If not improving could consider Pennsaid.  Recheck back as needed if not improving neck step would be x-ray and possibly MRI.    Orders Placed This Encounter  Procedures  . [place] LIMITED JOINT SPACE STRUCTURES UP RIGHT(NO LINKED CHARGES)    Order Specific Question:   Reason for Exam (SYMPTOM  OR DIAGNOSIS REQUIRED)    Answer:   R shoulder pain    Order Specific Question:   Preferred imaging location?    Answer:   [place]   No orders of the defined types were placed in this encounter.  Discussed warning signs or symptoms. Please see discharge instructions. Patient expresses understanding.   The above documentation has been reviewed and is accurate and complete [name]

## 2019-05-11 NOTE — Patient Instructions (Addendum)
Thank you for coming in today. You had a R shoulder injection.  Call or go to the ER if you develop a large red swollen joint with extreme pain or oozing puss.  Try the voltaren gel.  Recheck with me as needed.    Shoulder Impingement Syndrome Rehab Ask your health care provider which exercises are safe for you. Do exercises exactly as told by your health care provider and adjust them as directed. It is normal to feel mild stretching, pulling, tightness, or discomfort as you do these exercises. Stop right away if you feel sudden pain or your pain gets worse. Do not begin these exercises until told by your health care provider. Stretching and range-of-motion exercise This exercise warms up your muscles and joints and improves the movement and flexibility of your shoulder. This exercise also helps to relieve pain and stiffness. Passive horizontal adduction In passive adduction, you use your other hand to move the injured arm toward your body. The injured arm does not move on its own. In this movement, your arm is moved across your body in the horizontal plane (horizontal adduction). 1. Sit or stand and pull your left / right elbow across your chest, toward your other shoulder. Stop when you feel a gentle stretch in the back of your shoulder and upper arm. ? Keep your arm at shoulder height. ? Keep your arm as close to your body as you comfortably can. 2. Hold for __________ seconds. 3. Slowly return to the starting position. Repeat __________ times. Complete this exercise __________ times a day. Strengthening exercises These exercises build strength and endurance in your shoulder. Endurance is the ability to use your muscles for a long time, even after they get tired. External rotation, isometric This is an exercise in which you press the back of your wrist against a door frame without moving your shoulder joint (isometric). 1. Stand or sit in a doorway, facing the door frame. 2. Bend your left  / right elbow and place the back of your wrist against the door frame. Only the back of your wrist should be touching the frame. Keep your upper arm at your side. 3. Gently press your wrist against the door frame, as if you are trying to push your arm away from your abdomen (external rotation). Press as hard as you are able without pain. ? Avoid shrugging your shoulder while you press your wrist against the door frame. Keep your shoulder blade tucked down toward the middle of your back. 4. Hold for __________ seconds. 5. Slowly release the tension, and relax your muscles completely before you repeat the exercise. Repeat __________ times. Complete this exercise __________ times a day. Internal rotation, isometric This is an exercise in which you press your palm against a door frame without moving your shoulder joint (isometric). 1. Stand or sit in a doorway, facing the door frame. 2. Bend your left / right elbow and place the palm of your hand against the door frame. Only your palm should be touching the frame. Keep your upper arm at your side. 3. Gently press your hand against the door frame, as if you are trying to push your arm toward your abdomen (internal rotation). Press as hard as you are able without pain. ? Avoid shrugging your shoulder while you press your hand against the door frame. Keep your shoulder blade tucked down toward the middle of your back. 4. Hold for __________ seconds. 5. Slowly release the tension, and relax your muscles completely before  you repeat the exercise. Repeat __________ times. Complete this exercise __________ times a day. Scapular protraction, supine  1. Lie on your back on a firm surface (supine position). Hold a __________ weight in your left / right hand. 2. Raise your left / right arm straight into the air so your hand is directly above your shoulder joint. 3. Push the weight into the air so your shoulder (scapula) lifts off the surface that you are lying  on. The scapula will push up or forward (protraction). Do not move your head, neck, or back. 4. Hold for __________ seconds. 5. Slowly return to the starting position. Let your muscles relax completely before you repeat this exercise. Repeat __________ times. Complete this exercise __________ times a day. Scapular retraction  1. Sit in a stable chair without armrests, or stand up. 2. Secure an exercise band to a stable object in front of you so the band is at shoulder height. 3. Hold one end of the exercise band in each hand. Your palms should face down. 4. Squeeze your shoulder blades together (retraction) and move your elbows slightly behind you. Do not shrug your shoulders upward while you do this. 5. Hold for __________ seconds. 6. Slowly return to the starting position. Repeat __________ times. Complete this exercise __________ times a day. Shoulder extension  1. Sit in a stable chair without armrests, or stand up. 2. Secure an exercise band to a stable object in front of you so the band is above shoulder height. 3. Hold one end of the exercise band in each hand. 4. Straighten your elbows and lift your hands up to shoulder height. 5. Squeeze your shoulder blades together and pull your hands down to the sides of your thighs (extension). Stop when your hands are straight down by your sides. Do not let your hands go behind your body. 6. Hold for __________ seconds. 7. Slowly return to the starting position. Repeat __________ times. Complete this exercise __________ times a day. This information is not intended to replace advice given to you by your health care provider. Make sure you discuss any questions you have with your health care provider. Document Revised: 07/08/2018 Document Reviewed: 04/11/2018 Elsevier Patient Education  [place].

## 2019-05-12 ENCOUNTER — Ambulatory Visit: Payer: BC Managed Care – PPO | Admitting: Internal Medicine

## 2019-05-12 ENCOUNTER — Other Ambulatory Visit: Payer: Self-pay

## 2019-05-12 ENCOUNTER — Encounter: Payer: Self-pay | Admitting: Internal Medicine

## 2019-05-12 VITALS — BP 122/76 | HR 72 | Temp 98.6°F | Ht 72.0 in | Wt 191.0 lb

## 2019-05-12 DIAGNOSIS — I10 Essential (primary) hypertension: Secondary | ICD-10-CM

## 2019-05-12 DIAGNOSIS — Z0000 Encounter for general adult medical examination without abnormal findings: Secondary | ICD-10-CM

## 2019-05-12 DIAGNOSIS — E785 Hyperlipidemia, unspecified: Secondary | ICD-10-CM

## 2019-05-12 DIAGNOSIS — J309 Allergic rhinitis, unspecified: Secondary | ICD-10-CM

## 2019-05-12 DIAGNOSIS — G8929 Other chronic pain: Secondary | ICD-10-CM

## 2019-05-12 DIAGNOSIS — E611 Iron deficiency: Secondary | ICD-10-CM

## 2019-05-12 DIAGNOSIS — M5442 Lumbago with sciatica, left side: Secondary | ICD-10-CM

## 2019-05-12 DIAGNOSIS — H9193 Unspecified hearing loss, bilateral: Secondary | ICD-10-CM

## 2019-05-12 DIAGNOSIS — E538 Deficiency of other specified B group vitamins: Secondary | ICD-10-CM

## 2019-05-12 DIAGNOSIS — E119 Type 2 diabetes mellitus without complications: Secondary | ICD-10-CM | POA: Diagnosis not present

## 2019-05-12 DIAGNOSIS — E559 Vitamin D deficiency, unspecified: Secondary | ICD-10-CM

## 2019-05-12 DIAGNOSIS — M5441 Lumbago with sciatica, right side: Secondary | ICD-10-CM

## 2019-05-12 LAB — LIPID PANEL
Cholesterol: 143 mg/dL (ref 0–200)
HDL: 56.8 mg/dL (ref >39.00)
LDL Cholesterol: 68 mg/dL (ref 0–99)
NonHDL: 86.25
Total CHOL/HDL Ratio: 3
Triglycerides: 92 mg/dL (ref 0.0–149.0)
VLDL: 18.4 mg/dL (ref 0.0–40.0)

## 2019-05-12 LAB — HEPATIC FUNCTION PANEL
ALT: 27 U/L (ref 0–53)
AST: 19 U/L (ref 0–37)
Albumin: 4.5 g/dL (ref 3.5–5.2)
Alkaline Phosphatase: 65 U/L (ref 39–117)
Bilirubin, Direct: 0.1 mg/dL (ref 0.0–0.3)
Total Bilirubin: 0.4 mg/dL (ref 0.2–1.2)
Total Protein: 7.4 g/dL (ref 6.0–8.3)

## 2019-05-12 LAB — BASIC METABOLIC PANEL
BUN: 14 mg/dL (ref 6–23)
CO2: 27 mEq/L (ref 19–32)
Calcium: 9.6 mg/dL (ref 8.4–10.5)
Chloride: 104 mEq/L (ref 96–112)
Creatinine, Ser: 1.21 mg/dL (ref 0.40–1.50)
GFR: 62.94 mL/min (ref >60.00)
Glucose, Bld: 275 mg/dL — ABNORMAL HIGH (ref 70–99)
Potassium: 4.4 mEq/L (ref 3.5–5.1)
Sodium: 138 mEq/L (ref 135–145)

## 2019-05-12 LAB — HEMOGLOBIN A1C: Hgb A1c MFr Bld: 6.9 % — ABNORMAL HIGH (ref 4.6–6.5)

## 2019-05-12 MED ORDER — TRAMADOL HCL 50 MG PO TABS: 50.0000 mg | ORAL_TABLET | Freq: Four times a day (QID) | ORAL | 0 refills | Status: DC | PRN

## 2019-05-12 MED ORDER — METHOCARBAMOL 500 MG PO TABS: 500.0000 mg | ORAL_TABLET | Freq: Three times a day (TID) | ORAL | 3 refills | Status: DC | PRN

## 2019-05-12 MED ORDER — TRIAMCINOLONE ACETONIDE 55 MCG/ACT NA AERO: 2.0000 | INHALATION_SPRAY | Freq: Every day | NASAL | 3 refills | Status: DC

## 2019-05-12 MED ORDER — FEXOFENADINE HCL 180 MG PO TABS: 180.0000 mg | ORAL_TABLET | Freq: Every day | ORAL | 3 refills | Status: DC

## 2019-05-12 NOTE — Patient Instructions (Signed)
Please take all new medication as prescribed - the tramadol for pain, and allegra for allergies  Please continue all other medications as before, including the nasacort, and robaxin for muscle relaxer  You can also take Mucinex (or it's generic off brand) for congestion, and tylenol as needed for pain  Please have the pharmacy call with any other refills you may need.  Please continue your efforts at being more active, low cholesterol diabetic diet, and weight control.  You will be contacted regarding the referral for: MRI for the lower back  Please keep your appointments with your specialists as you may have planned  Please go to the LAB at the blood drawing area for the tests to be done  You will be contacted by phone if any changes need to be made immediately.  Otherwise, you will receive a letter about your results with an explanation, but please check with MyChart first.  Please remember to sign up for MyChart if you have not done so, as this will be important to you in the future with finding out test results, communicating by private email, and scheduling acute appointments online when needed.  Please make an Appointment to return in 6 months, or sooner if needed, also with Lab Appointment for testing done 3-5 days before at the [facility] (so this is for TWO appointments - please see the scheduling desk as you leave)

## 2019-05-12 NOTE — Assessment & Plan Note (Signed)
stable overall by history and exam, recent data reviewed with pt, and pt to continue medical treatment as before,  to f/u any worsening symptoms or concerns  

## 2019-05-12 NOTE — Assessment & Plan Note (Addendum)
Etiology unclear , for tramadol prn, flexeril prn, MRI LS spine  I spent 40 minutes in addition to time for wellness examination in preparing to see the patient by review of recent labs, imaging and procedures, obtaining and reviewing separately obtained history, communicating with the patient and family or caregiver, ordering medications, tests or procedures, and documenting clinical information in the EHR including the differential Dx, treatment, and any further evaluation and other management of LBP with sciatica, DM, bilat hearing loss, allergic rhinitis, HTN, HLD,

## 2019-05-12 NOTE — Assessment & Plan Note (Signed)
Chronic with bilat eustach tube dysfxn, left > right, for mucinex otc bid, allegra, nasacort asd

## 2019-05-12 NOTE — Assessment & Plan Note (Signed)
For allegra/nasacort asd,  to f/u any worsening symptoms or concerns

## 2019-05-12 NOTE — Progress Notes (Signed)
Subjective:    Patient ID: Jacob [name], male    DOB: 29-Mar-1966, 54 y.o.   MRN: [id number]  HPI  Here to f/u; overall doing ok,  Pt denies chest pain, increasing sob or doe, wheezing, orthopnea, PND, increased LE swelling, palpitations, dizziness or syncope.  Pt denies new neurological symptoms such as new headache, or facial or extremity weakness or numbness.  Pt denies polydipsia, polyuria, or low sugar episode.  Pt states overall good compliance with meds, mostly trying to follow appropriate diet, with wt overall stable,  but little exercise however.  Also with 3 mo persistent worsening LBP now with pain to buttocks and bilat post upper legs left > right.  Also Does have several wks ongoing nasal allergy symptoms with clearish congestion, itch and sneezing, without fever, pain, ST, cough, swelling or wheezing but with right ear fullness, muffled hearing, popping and crackling.   Past Medical History:  Diagnosis Date  . Allergic rhinitis   . Anxiety   . Arthritis   . Cholesteatoma of right ear   . Diabetes mellitus ([place])    bordreline  . Dysuria   . Enlarged prostate    pt unaware  . GERD (gastroesophageal reflux disease)   . Headache   . Hearing loss    Right ear  . Hernia of abdominal wall   . History of gross hematuria   . History of head injury   . History of kidney stones   . History of palpitations   . Hyperlipidemia 10/18/2014  . Hypertension   . Insomnia   . Lack of concentration   . Nasal fracture    Several  . Otitis media 11/10/2017   Bilateral  . Panic attacks   . Syncope   . Vertigo    Past Surgical History:  Procedure Laterality Date  . CHOLECYSTECTOMY    . CYSTOSCOPY N/A 05/10/2018   Procedure: CYSTOSCOPY WITH EXAM UNDER ANESTHESIA;  Surgeon: Ceasar Mons, MD;  Location: [facility];  Service: Urology;  Laterality: N/A;  ONLY NEEDS 30 MIN  . [place]  . INNER EAR SURGERY     right  . LUMBAR DISC  SURGERY    . MASTOIDECTOMY Right   . NOSE SURGERY     Fracture  . tubes in bil ears    . UPPER GI ENDOSCOPY  01/09/2012   Esophageal dilation    reports that he has never smoked. He has never used smokeless tobacco. He reports current alcohol use of about 5.0 standard drinks of alcohol per week. He reports that he does not use drugs. family history includes ADD / ADHD in his son; Anxiety disorder in his cousin, maternal aunt, maternal grandmother, maternal uncle, and mother; COPD in his mother; Cancer in his father; Diabetes in his maternal grandmother and paternal grandmother; Drug abuse in his son; Heart attack in his brother; Heart disease in his father; Hypertension in his mother; Lung cancer in his maternal uncle; OCD in his maternal aunt. No Known Allergies Current Outpatient Medications on File Prior to Visit  Medication Sig Dispense Refill  . ALPRAZolam (XANAX) 0.5 MG tablet TAKE 1 TO 2 TABLETS BY MOUTH AT BEDTIME AS NEEDED (Patient taking differently: Take 0.25 mg by mouth at bedtime. ) 60 tablet 5  . aspirin EC 81 MG tablet Take 1 tablet (81 mg total) by mouth daily. 90 tablet 11  . CIALIS 20 MG tablet TAKE 1 TABLET BY MOUTH EVERY DAY AS NEEDED (  Patient taking differently: Take 10 mg by mouth daily as needed for erectile dysfunction. ) 10 tablet 5  . FLUoxetine (PROZAC) 40 MG capsule Take 40 mg by mouth every evening.    . metFORMIN (GLUCOPHAGE-XR) 500 MG 24 hr tablet TAKE 3 TABLETS ONCE DAILY WITH BREAKFAST 270 tablet 1  . Multiple Vitamin (MULTIVITAMIN WITH MINERALS) TABS tablet Take 1 tablet by mouth daily.    . Omega-3 Fatty Acids (FISH OIL PO) Take 1 capsule by mouth daily.    . pantoprazole (PROTONIX) 40 MG tablet TAKE 1 TABLET BY MOUTH EVERY DAY 90 tablet 3  . ramipril (ALTACE) 10 MG capsule TAKE 1 CAPSULE DAILY 90 capsule 1  . rosuvastatin (CRESTOR) 10 MG tablet TAKE 1 TABLET DAILY 90 tablet 1  . tamsulosin (FLOMAX) 0.4 MG CAPS capsule Take 1 capsule (0.4 mg total) by mouth  daily. (Patient not taking: Reported on 05/12/2019) 90 capsule 1   No current facility-administered medications on file prior to visit.   Review of Systems All otherwise neg per pt     Objective:   Physical Exam BP 122/76   Pulse 72   Temp 98.6 F (37 C)   Ht 6' (1.829 m)   Wt 191 lb (86.6 kg)   SpO2 99%   BMI 25.90 kg/m  VS noted,  Constitutional: Pt appears in NAD HENT: Head: NCAT.  Right Ear: External ear normal.  Left Ear: External ear normal.  Eyes: . Pupils are equal, round, and reactive to light. Conjunctivae and EOM are normal Nose: without d/c or deformity Neck: Neck supple. Gross normal ROM Cardiovascular: Normal rate and regular rhythm.   Pulmonary/Chest: Effort normal and breath sounds without rales or wheezing.  Abd:  Soft, NT, ND, + BS, no organomegaly Neurological: Pt is alert. At baseline orientation, motor grossly intact Spine - NT in midline Skin: Skin is warm. No rashes, other new lesions, no LE edema Psychiatric: Pt behavior is normal without agitation  All otherwise neg per pt Lab Results  Component Value Date   WBC 4.8 03/09/2019   HGB 16.0 03/09/2019   HCT 47.9 03/09/2019   PLT 220 03/09/2019   GLUCOSE 275 (H) 05/12/2019   CHOL 143 05/12/2019   TRIG 92.0 05/12/2019   HDL 56.80 05/12/2019   LDLDIRECT 102.0 11/23/2016   LDLCALC 68 05/12/2019   ALT 27 05/12/2019   AST 19 05/12/2019   NA 138 05/12/2019   K 4.4 05/12/2019   CL 104 05/12/2019   CREATININE 1.21 05/12/2019   BUN 14 05/12/2019   CO2 27 05/12/2019   TSH 1.19 08/10/2018   PSA 1.07 08/10/2018   INR 0.96 12/11/2017   HGBA1C 6.9 (H) 05/12/2019   MICROALBUR <0.7 10/08/2017         Assessment & Plan:

## 2019-05-15 DIAGNOSIS — Z1152 Encounter for screening for COVID-19: Secondary | ICD-10-CM | POA: Diagnosis not present

## 2019-05-19 ENCOUNTER — Telehealth: Payer: Self-pay | Admitting: Internal Medicine

## 2019-05-19 ENCOUNTER — Other Ambulatory Visit: Payer: Self-pay | Admitting: *Deleted

## 2019-05-19 DIAGNOSIS — F411 Generalized anxiety disorder: Secondary | ICD-10-CM

## 2019-05-19 DIAGNOSIS — F422 Mixed obsessional thoughts and acts: Secondary | ICD-10-CM

## 2019-05-19 DIAGNOSIS — F410 Panic disorder [episodic paroxysmal anxiety] without agoraphobia: Secondary | ICD-10-CM

## 2019-05-19 MED ORDER — FLUOXETINE HCL 40 MG PO CAPS: 40.0000 mg | ORAL_CAPSULE | Freq: Every day | ORAL | 3 refills | Status: DC

## 2019-05-19 MED ORDER — FLUOXETINE HCL 40 MG PO CAPS: 40.0000 mg | ORAL_CAPSULE | Freq: Every day | ORAL | 1 refills | Status: DC

## 2019-05-19 MED ORDER — RAMIPRIL 10 MG PO CAPS: ORAL_CAPSULE | ORAL | 3 refills | Status: DC

## 2019-05-19 NOTE — Telephone Encounter (Signed)
Done erx 

## 2019-05-19 NOTE — Telephone Encounter (Signed)
       1. Which medications need to be refilled? (please list name of each medication and dose if known)  ramipril (ALTACE) 10 MG capsule FLUoxetine (PROZAC) 40 MG capsule   2. Which pharmacy/location (including street and city if local pharmacy) is medication to be sent to? [id number] - [place], [place] - [address]  3. Do they need a 30 day or 90 day supply? [place]

## 2019-05-29 ENCOUNTER — Encounter: Payer: Self-pay | Admitting: Internal Medicine

## 2019-06-23 ENCOUNTER — Ambulatory Visit: Payer: Self-pay | Admitting: Psychiatry

## 2019-06-24 ENCOUNTER — Other Ambulatory Visit: Payer: Self-pay | Admitting: Internal Medicine

## 2019-08-07 ENCOUNTER — Telehealth: Payer: Self-pay | Admitting: Internal Medicine

## 2019-08-07 DIAGNOSIS — E119 Type 2 diabetes mellitus without complications: Secondary | ICD-10-CM

## 2019-08-07 DIAGNOSIS — F422 Mixed obsessional thoughts and acts: Secondary | ICD-10-CM

## 2019-08-07 DIAGNOSIS — F410 Panic disorder [episodic paroxysmal anxiety] without agoraphobia: Secondary | ICD-10-CM

## 2019-08-07 DIAGNOSIS — F411 Generalized anxiety disorder: Secondary | ICD-10-CM

## 2019-08-07 NOTE — Telephone Encounter (Signed)
New Message:   1.Medication Requested: ramipril (ALTACE) 10 MG capsule metFORMIN (GLUCOPHAGE-XR) 500 MG 24 hr tablet rosuvastatin (CRESTOR) 10 MG tablet FLUoxetine (PROZAC) 40 MG capsule 2. Pharmacy (Name, [place]): [place], [place] 3. On Med List: yes  4. Last Visit with PCP: 05/12/19  5. Next visit date with PCP: None  90 day supply Agent: Please be advised that RX refills may take up to 3 business days. We ask that you follow-up with your pharmacy.

## 2019-08-08 MED ORDER — METFORMIN HCL ER 500 MG PO TB24: 1500.0000 mg | ORAL_TABLET | Freq: Every day | ORAL | 3 refills | Status: DC

## 2019-08-08 MED ORDER — ROSUVASTATIN CALCIUM 10 MG PO TABS: 10.0000 mg | ORAL_TABLET | Freq: Every day | ORAL | 3 refills | Status: DC

## 2019-08-08 MED ORDER — FLUOXETINE HCL 40 MG PO CAPS: 40.0000 mg | ORAL_CAPSULE | Freq: Every day | ORAL | 3 refills | Status: DC

## 2019-08-08 MED ORDER — RAMIPRIL 10 MG PO CAPS: 10.0000 mg | ORAL_CAPSULE | Freq: Every day | ORAL | 3 refills | Status: DC

## 2019-08-08 NOTE — Telephone Encounter (Signed)
erx sent as requested.  

## 2019-08-11 ENCOUNTER — Other Ambulatory Visit: Payer: Self-pay | Admitting: Internal Medicine

## 2019-08-11 NOTE — Telephone Encounter (Signed)
Please refill as per office routine med refill policy (all routine meds refilled for 3 mo or monthly per pt preference up to one year from last visit, then month to month grace period for 3 mo, then further med refills will have to be denied)  

## 2019-08-21 ENCOUNTER — Encounter: Payer: Self-pay | Admitting: Family

## 2019-08-21 ENCOUNTER — Other Ambulatory Visit: Payer: Self-pay

## 2019-08-21 ENCOUNTER — Ambulatory Visit: Payer: Self-pay | Admitting: Psychiatry

## 2019-08-21 ENCOUNTER — Ambulatory Visit (INDEPENDENT_AMBULATORY_CARE_PROVIDER_SITE_OTHER): Payer: Commercial Managed Care - PPO | Admitting: Family

## 2019-08-21 ENCOUNTER — Other Ambulatory Visit: Payer: Self-pay | Admitting: Family

## 2019-08-21 VITALS — BP 132/80 | HR 71 | Temp 98.2°F | Ht 72.0 in | Wt 198.0 lb

## 2019-08-21 DIAGNOSIS — E119 Type 2 diabetes mellitus without complications: Secondary | ICD-10-CM

## 2019-08-21 DIAGNOSIS — F411 Generalized anxiety disorder: Secondary | ICD-10-CM

## 2019-08-21 LAB — POCT GLYCOSYLATED HEMOGLOBIN (HGB A1C): Hemoglobin A1C: 7.3 % — AB (ref 4.0–5.6)

## 2019-08-21 NOTE — Progress Notes (Signed)
Jacob [name] is a 54 y.o. male with the following history as recorded in EpicCare:  Patient Active Problem List   Diagnosis Date Noted  . 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  . Sleep disorder 08/23/2018  . Memory loss 08/11/2018  . Dysuria 04/22/2018  . Gross hematuria 04/22/2018  . STD exposure 04/22/2018  . Lack of concentration 12/21/2017  . Panic attacks 12/21/2017  . Obsessional thoughts 12/21/2017  . Acute sinus infection 06/11/2017  . Colon cancer screening 04/09/2017  . Nausea without vomiting 04/09/2017  . Dysphagia 04/09/2017  . Dyspepsia 11/19/2016  . Right otitis media 09/09/2016  . Vertigo 09/09/2016  . Hearing loss, right 09/09/2016  . BPH (benign prostatic hyperplasia) 05/30/2016  . Right shoulder pain 05/30/2016  . Acute midline low back pain with sciatica 04/21/2016  . 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 04/21/2016  . Urinary frequency 02/05/2016  . Syncope 09/27/2015  . Left lateral epicondylitis 08/31/2015  . Allergic rhinitis 08/29/2015  . Bilateral hearing loss 06/05/2015  . Hypersomnolence 06/05/2015  . Chest pain 01/23/2015  . Chronic pain syndrome 01/23/2015  . Hyperlipidemia 10/18/2014  . Impingement syndrome of right shoulder 06/28/2014  . Hip flexor tendon tightness 06/28/2014  . Right knee pain 06/28/2014  . Depression with anxiety 01/30/2013  . Diabetes mellitus without complication ([place]) 0000000  . Encounter for well adult exam with abnormal findings 01/11/2013  . Essential hypertension, benign 01/11/2013  . Low back pain syndrome 01/11/2013  . DJD (degenerative joint disease) 01/11/2013  . Erectile dysfunction 01/11/2013  . GERD (gastroesophageal reflux disease) 12/30/2011    Current Outpatient Medications  Medication Sig Dispense Refill  . ALPRAZolam (XANAX) 0.5 MG tablet TAKE 1 TO 2 TABLETS BY MOUTH AT BEDTIME AS NEEDED 60 tablet 5  . aspirin EC 81 MG tablet Take 1 tablet (81 mg total) by mouth  daily. 90 tablet 11  . CIALIS 20 MG tablet TAKE 1 TABLET BY MOUTH EVERY DAY AS NEEDED (Patient taking differently: Take 10 mg by mouth daily as needed for erectile dysfunction. ) 10 tablet 5  . fexofenadine (ALLEGRA) 180 MG tablet Take 1 tablet (180 mg total) by mouth daily. 90 tablet 3  . FLUoxetine (PROZAC) 40 MG capsule Take 1 capsule (40 mg total) by mouth daily. 90 capsule 3  . metFORMIN (GLUCOPHAGE-XR) 500 MG 24 hr tablet Take 3 tablets (1,500 mg total) by mouth daily with breakfast. 270 tablet 3  . methocarbamol (ROBAXIN) 500 MG tablet Take 1 tablet (500 mg total) by mouth every 8 (eight) hours as needed for muscle spasms. 90 tablet 3  . Multiple Vitamin (MULTIVITAMIN WITH MINERALS) TABS tablet Take 1 tablet by mouth daily.    Marland Kitchen ofloxacin (FLOXIN) 0.3 % OTIC solution INSTILL 5 DROPS INTO LEFT EAR TWICE DAILY FOR 5 DAYS DISCARD REMIANDER    . Omega-3 Fatty Acids (FISH OIL PO) Take 1 capsule by mouth daily.    . pantoprazole (PROTONIX) 40 MG tablet TAKE 1 TABLET BY MOUTH EVERY DAY 90 tablet 3  . ramipril (ALTACE) 10 MG capsule Take 1 capsule (10 mg total) by mouth daily. Annual appt is due in must see provider for future refills 30 capsule 0  . rosuvastatin (CRESTOR) 10 MG tablet Take 1 tablet (10 mg total) by mouth daily. 90 tablet 3  . Vitamins/Minerals TABS Take by mouth.    . tamsulosin (FLOMAX) 0.4 MG CAPS capsule Take 1 capsule (0.4 mg total) by mouth daily. (Patient not taking: Reported  on 05/12/2019) 90 capsule 1  . traMADol (ULTRAM) 50 MG tablet Take 1 tablet (50 mg total) by mouth every 6 (six) hours as needed. (Patient not taking: Reported on 08/21/2019) 30 tablet 0  . triamcinolone (NASACORT) 55 MCG/ACT AERO nasal inhaler Place 2 sprays into the nose daily. (Patient not taking: Reported on 08/21/2019) 3 Inhaler 3   No current facility-administered medications for this visit.    Allergies: Patient has no known allergies.  Past Medical History:  Diagnosis Date  . Allergic rhinitis    . Anxiety   . Arthritis   . Cholesteatoma of right ear   . Diabetes mellitus ([place])    bordreline  . Dysuria   . Enlarged prostate    pt unaware  . GERD (gastroesophageal reflux disease)   . Headache   . Hearing loss    Right ear  . Hernia of abdominal wall   . History of gross hematuria   . History of head injury   . History of kidney stones   . History of palpitations   . Hyperlipidemia 10/18/2014  . Hypertension   . Insomnia   . Lack of concentration   . Nasal fracture    Several  . Otitis media 11/10/2017   Bilateral  . Panic attacks   . Syncope   . Vertigo     Past Surgical History:  Procedure Laterality Date  . CHOLECYSTECTOMY    . CYSTOSCOPY N/A 05/10/2018   Procedure: CYSTOSCOPY WITH EXAM UNDER ANESTHESIA;  Surgeon: Ceasar Mons, MD;  Location: [facility];  Service: Urology;  Laterality: N/A;  ONLY NEEDS 30 MIN  . [place]  . INNER EAR SURGERY     right  . LUMBAR DISC SURGERY    . MASTOIDECTOMY Right   . NOSE SURGERY     Fracture  . tubes in bil ears    . UPPER GI ENDOSCOPY  01/09/2012   Esophageal dilation    Family History  Problem Relation Age of Onset  . COPD Mother   . Hypertension Mother   . Anxiety disorder Mother   . Heart disease Father   . Cancer Father   . Diabetes Paternal Grandmother   . Diabetes Maternal Grandmother   . Anxiety disorder Maternal Grandmother   . Heart attack Brother   . Lung cancer Maternal Uncle   . Anxiety disorder Maternal Uncle   . Anxiety disorder Maternal Aunt   . OCD Maternal Aunt   . Anxiety disorder Cousin   . ADD / ADHD Son   . Drug abuse Son   . Colon cancer Neg Hx   . Esophageal cancer Neg Hx   . Rectal cancer Neg Hx   . Stomach cancer Neg Hx     Social History   Tobacco Use  . Smoking status: Never Smoker  . Smokeless tobacco: Never Used  Substance Use Topics  . Alcohol use: Yes    Alcohol/week: 5.0 standard drinks    Types: 5 Glasses of  wine per week    Comment: weekends    Subjective:  Patient has been having increased anxiety in the past week- notes that co-worker has recently passed away unexpectedly from diabetes complications and this seems to have been a trigger for him. Notes that has felt mildly dizzy as well and was concerned about his ears; Would like to get his labs checked to make sure that he is okay; Is scheduled to see his psychiatrist today- overdue for follow-up  appointment/ has not seen in a year;    Objective:  Vitals:   08/21/19 1033  BP: 132/80  Pulse: 71  Temp: 98.2 F (36.8 C)  TempSrc: Oral  SpO2: 97%  Weight: 198 lb (89.8 kg)  Height: 6' (1.829 m)    General: Well developed, well nourished, in no acute distress  Skin : Warm and dry.  Head: Normocephalic and atraumatic  Eyes: Sclera and conjunctiva clear; pupils round and reactive to light; extraocular movements intact  Ears: External normal; canals clear; tympanic membranes normal  Oropharynx: Pink, supple. No suspicious lesions  Neck: Supple without thyromegaly, adenopathy  Lungs: Respirations unlabored; clear to auscultation bilaterally without wheeze, rales, rhonchi  CVS exam: normal rate and regular rhythm.  Neurologic: Alert and oriented; speech intact; face symmetrical; moves all extremities well; CNII-XII intact without focal deficit   Assessment:  1. Generalized anxiety disorder   2. Type 2 diabetes mellitus without complication, without long-term current use of insulin ([place])     Plan:  Reassurance given to patient that his physical exam is normal; agree with decision to follow-up with his psychiatrist today; offered to have him see neurology due to his concerns about his memory but discussed that anxiety can affect memory and concentration; he defers referral today; he notes he mostly wanted to get his Hgba1c checked today "to make sure" his diabetes is controlled; he defers having other labs updated;  Hgba1c is up slightly but  still stable at 7.3 today; keep planned follow-up with his psychiatrist today and PCP for later this summer.  This visit occurred during the SARS-CoV-2 public health emergency.  Safety protocols were in place, including screening questions prior to the visit, additional usage of staff PPE, and extensive cleaning of exam room while observing appropriate contact time as indicated for disinfecting solutions.     No follow-ups on file.  No orders of the defined types were placed in this encounter.   Requested Prescriptions    No prescriptions requested or ordered in this encounter

## 2019-08-21 NOTE — Addendum Note (Signed)
Addended by: Marcina Millard on: 08/21/2019 11:43 AM   Modules accepted: Orders

## 2019-09-07 ENCOUNTER — Telehealth: Payer: Self-pay | Admitting: Psychiatry

## 2019-10-06 ENCOUNTER — Other Ambulatory Visit: Payer: Self-pay | Admitting: Internal Medicine

## 2019-10-06 NOTE — Telephone Encounter (Signed)
Done erx 

## 2019-10-26 ENCOUNTER — Other Ambulatory Visit: Payer: Self-pay

## 2019-10-26 DIAGNOSIS — F411 Generalized anxiety disorder: Secondary | ICD-10-CM

## 2019-10-26 DIAGNOSIS — F410 Panic disorder [episodic paroxysmal anxiety] without agoraphobia: Secondary | ICD-10-CM

## 2019-10-26 DIAGNOSIS — F422 Mixed obsessional thoughts and acts: Secondary | ICD-10-CM

## 2019-10-26 MED ORDER — RAMIPRIL 10 MG PO CAPS: 10.0000 mg | ORAL_CAPSULE | Freq: Every day | ORAL | 0 refills | Status: DC

## 2019-10-26 MED ORDER — FLUOXETINE HCL 40 MG PO CAPS: 40.0000 mg | ORAL_CAPSULE | Freq: Every day | ORAL | 3 refills | Status: DC

## 2019-10-27 ENCOUNTER — Other Ambulatory Visit: Payer: Self-pay

## 2019-10-27 ENCOUNTER — Ambulatory Visit (INDEPENDENT_AMBULATORY_CARE_PROVIDER_SITE_OTHER): Payer: Commercial Managed Care - PPO | Admitting: Family Medicine

## 2019-10-27 ENCOUNTER — Encounter: Payer: Self-pay | Admitting: Family Medicine

## 2019-10-27 ENCOUNTER — Ambulatory Visit: Payer: Self-pay

## 2019-10-27 VITALS — BP 110/72 | HR 71 | Ht 72.0 in | Wt 187.0 lb

## 2019-10-27 DIAGNOSIS — G8929 Other chronic pain: Secondary | ICD-10-CM

## 2019-10-27 DIAGNOSIS — M25511 Pain in right shoulder: Secondary | ICD-10-CM

## 2019-10-27 DIAGNOSIS — S46011A Strain of muscle(s) and tendon(s) of the rotator cuff of right shoulder, initial encounter: Secondary | ICD-10-CM

## 2019-10-27 DIAGNOSIS — M75101 Unspecified rotator cuff tear or rupture of right shoulder, not specified as traumatic: Secondary | ICD-10-CM | POA: Insufficient documentation

## 2019-10-27 MED ORDER — MELOXICAM 7.5 MG PO TABS
7.5000 mg | ORAL_TABLET | Freq: Every day | ORAL | 0 refills | Status: DC
Start: 2019-10-27 — End: 2020-05-31

## 2019-10-27 NOTE — Patient Instructions (Signed)
Exercises 3x a week Injected shoulder today Ice 20 min 2x a day See me again in 5-6 weeks

## 2019-10-27 NOTE — Progress Notes (Signed)
[place] [address] [place] Phone: [phone] Subjective:   I Jacob [name] am serving as a Education administrator for Dr. Hulan Saas.  This visit occurred during the SARS-CoV-2 public health emergency.  Safety protocols were in place, including screening questions prior to the visit, additional usage of staff PPE, and extensive cleaning of exam room while observing appropriate contact time as indicated for disinfecting solutions.   I'm seeing this patient by the request  of:  Biagio Borg, MD  CC: Shoulder pain follow-up  [id number]   06/28/2014 Patient does have some shoulder impingement there is a question of patient having a worsening. Labral cyst. Patient in 2008 did have this on MRI and we may need to consider further imaging a patient continues to have difficulty. Patient has had some financial constraints and would not be able to take any time off of work so he is unable to do formal physical therapy. Patient given home exercises, topical anti-inflammatories and we discussed over-the-counter natural supplementation. Patient and will come back and see me again in 4-6 weeks. At that time we can consider an injection of patient would like.  10/27/2019 Jacob [name] is a 54 y.o. male coming in with complaint of right shoulder pain. States he would like an injection. Throwing football with his kids injured his shoulder.  Patient was seen previously nearly 5 years ago and had what seemed to be more of a labral cyst.  Patient now has been doing very well since then.  Has not had any pain until he started doing the throwing.  Onset- Chronic  Location - joint      Past Medical History:  Diagnosis Date  . Allergic rhinitis   . Anxiety   . Arthritis   . Cholesteatoma of right ear   . Diabetes mellitus ([place])    bordreline  . Dysuria   . Enlarged prostate    pt unaware  . GERD (gastroesophageal reflux disease)   . Headache   . Hearing  loss    Right ear  . Hernia of abdominal wall   . History of gross hematuria   . History of head injury   . History of kidney stones   . History of palpitations   . Hyperlipidemia 10/18/2014  . Hypertension   . Insomnia   . Lack of concentration   . Nasal fracture    Several  . Otitis media 11/10/2017   Bilateral  . Panic attacks   . Syncope   . Vertigo    Past Surgical History:  Procedure Laterality Date  . CHOLECYSTECTOMY    . CYSTOSCOPY N/A 05/10/2018   Procedure: CYSTOSCOPY WITH EXAM UNDER ANESTHESIA;  Surgeon: Ceasar Mons, MD;  Location: [facility];  Service: Urology;  Laterality: N/A;  ONLY NEEDS 30 MIN  . [place]  . INNER EAR SURGERY     right  . LUMBAR DISC SURGERY    . MASTOIDECTOMY Right   . NOSE SURGERY     Fracture  . tubes in bil ears    . UPPER GI ENDOSCOPY  01/09/2012   Esophageal dilation   Social History   Socioeconomic History  . Marital status: Single    Spouse name: Not on file  . Number of children: 3  . Years of education: Not on file  . Highest education level: Not on file  Occupational History  . Occupation: Music therapist: [employer]  [employer]  Tobacco Use  . Smoking status: Never Smoker  . Smokeless tobacco: Never Used  Vaping Use  . Vaping Use: Never used  Substance and Sexual Activity  . Alcohol use: Yes    Alcohol/week: 5.0 standard drinks    Types: 5 Glasses of wine per week    Comment: weekends  . Drug use: No  . Sexual activity: Yes    Partners: Female    Birth control/protection: None  Other Topics Concern  . Not on file  Social History Narrative  . Not on file   Social Determinants of Health   Financial Resource Strain:   . Difficulty of Paying Living Expenses:   Food Insecurity:   . Worried About Charity fundraiser in the Last Year:   . Arboriculturist in the Last Year:   Transportation Needs:   . Film/video editor (Medical):   Marland Kitchen Lack of Transportation  (Non-Medical):   Physical Activity:   . Days of Exercise per Week:   . Minutes of Exercise per Session:   Stress:   . Feeling of Stress :   Social Connections:   . Frequency of Communication with Friends and Family:   . Frequency of Social Gatherings with Friends and Family:   . Attends Religious Services:   . Active Member of Clubs or Organizations:   . Attends Archivist Meetings:   Marland Kitchen Marital Status:    No Known Allergies Family History  Problem Relation Age of Onset  . COPD Mother   . Hypertension Mother   . Anxiety disorder Mother   . Heart disease Father   . Cancer Father   . Diabetes Paternal Grandmother   . Diabetes Maternal Grandmother   . Anxiety disorder Maternal Grandmother   . Heart attack Brother   . Lung cancer Maternal Uncle   . Anxiety disorder Maternal Uncle   . Anxiety disorder Maternal Aunt   . OCD Maternal Aunt   . Anxiety disorder Cousin   . ADD / ADHD Son   . Drug abuse Son   . Colon cancer Neg Hx   . Esophageal cancer Neg Hx   . Rectal cancer Neg Hx   . Stomach cancer Neg Hx     Current Outpatient Medications (Endocrine & Metabolic):  .  metFORMIN (GLUCOPHAGE-XR) 500 MG 24 hr tablet, Take 3 tablets (1,500 mg total) by mouth daily with breakfast.  Current Outpatient Medications (Cardiovascular):  Marland Kitchen  CIALIS 20 MG tablet, TAKE 1 TABLET BY MOUTH EVERY DAY AS NEEDED (Patient taking differently: Take 10 mg by mouth daily as needed for erectile dysfunction. ) .  ramipril (ALTACE) 10 MG capsule, Take 1 capsule (10 mg total) by mouth daily. Annual appt is due in must see provider for future refills .  rosuvastatin (CRESTOR) 10 MG tablet, Take 1 tablet (10 mg total) by mouth daily.  Current Outpatient Medications (Respiratory):  .  fexofenadine (ALLEGRA) 180 MG tablet, Take 1 tablet (180 mg total) by mouth daily. Marland Kitchen  triamcinolone (NASACORT) 55 MCG/ACT AERO nasal inhaler, Place 2 sprays into the nose daily. (Patient not taking: Reported on  08/21/2019)  Current Outpatient Medications (Analgesics):  .  aspirin EC 81 MG tablet, Take 1 tablet (81 mg total) by mouth daily. .  traMADol (ULTRAM) 50 MG tablet, Take 1 tablet (50 mg total) by mouth every 6 (six) hours as needed. (Patient not taking: Reported on 08/21/2019)   Current Outpatient Medications (Other):  Marland Kitchen  ALPRAZolam (XANAX) 0.5  MG tablet, TAKE 1 TO 2 TABLETS BY MOUTH AT BEDTIME AS NEEDED .  FLUoxetine (PROZAC) 40 MG capsule, Take 1 capsule (40 mg total) by mouth daily. .  methocarbamol (ROBAXIN) 500 MG tablet, Take 1 tablet (500 mg total) by mouth every 8 (eight) hours as needed for muscle spasms. .  Multiple Vitamin (MULTIVITAMIN WITH MINERALS) TABS tablet, Take 1 tablet by mouth daily. Marland Kitchen  ofloxacin (FLOXIN) 0.3 % OTIC solution, INSTILL 5 DROPS INTO LEFT EAR TWICE DAILY FOR 5 DAYS DISCARD REMIANDER .  Omega-3 Fatty Acids (FISH OIL PO), Take 1 capsule by mouth daily. .  pantoprazole (PROTONIX) 40 MG tablet, TAKE 1 TABLET BY MOUTH EVERY DAY .  tamsulosin (FLOMAX) 0.4 MG CAPS capsule, Take 1 capsule (0.4 mg total) by mouth daily. (Patient not taking: Reported on 05/12/2019) .  Vitamins/Minerals TABS, Take by mouth.   Reviewed prior external information including notes and imaging from  primary care provider As well as notes that were available from care everywhere and other healthcare systems.  Past medical history, social, surgical and family history all reviewed in electronic medical record.  No pertanent information unless stated regarding to the chief complaint.   Review of Systems:  No headache, visual changes, nausea, vomiting, diarrhea, constipation, dizziness, abdominal pain, skin rash, fevers, chills, night sweats, weight loss, swollen lymph nodes, body aches, joint swelling, chest pain, shortness of breath, mood changes. POSITIVE muscle aches  Objective  There were no vitals taken for this visit.   General: No apparent distress alert and oriented x3 mood and  affect normal, dressed appropriately.  HEENT: Pupils equal, extraocular movements intact  Respiratory: Patient's speak in full sentences and does not appear short of breath  Cardiovascular: No lower extremity edema, non tender, no erythema  Neuro: Cranial nerves II through XII are intact, neurovascularly intact in all extremities with 2+ DTRs and 2+ pulses.  Gait normal with good balance and coordination.  MSK: Right shoulder exam does have a positive impingement.  4-5 strength of the rotator cuff.  Positive O'Brien noted.  Neurovascularly intact distally.  Limited musculoskeletal ultrasound was performed and interpreted by Lyndal Pulley  Limited ultrasound of patient's shoulder shows that patient does have a supraspinatus tear noted that is a low-grade with no significant retraction.  Seems to be somewhat healing.  Subacute in nature.  Questionable anterior labral pathology still noted.  Hypoechoic changes within the bicep tendon Impression: Subacute rotator cuff tear of the supraspinatus low-grade  Procedure: Real-time Ultrasound Guided Injection of right glenohumeral joint Device: GE Logiq Q7  Ultrasound guided injection is preferred based studies that show increased duration, increased effect, greater accuracy, decreased procedural pain, increased response rate with ultrasound guided versus blind injection.  Verbal informed consent obtained.  Time-out conducted.  Noted no overlying erythema, induration, or other signs of local infection.  Skin prepped in a sterile fashion.  Local anesthesia: Topical Ethyl chloride.  With sterile technique and under real time ultrasound guidance:  Joint visualized.  23g 1  inch needle inserted posterior approach. Pictures taken for needle placement. Patient did have injection of 2 cc of 1% lidocaine, 2 cc of 0.5% Marcaine, and 1.0 cc of Kenalog 40 mg/dL. Completed without difficulty  Pain immediately resolved suggesting accurate placement of the  medication.  Advised to call if fevers/chills, erythema, induration, drainage, or persistent bleeding.  Images permanently stored and available for review in the ultrasound unit.  Impression: Technically successful ultrasound guided injection.    Impression and Recommendations:  The above documentation has been reviewed and is accurate and complete Lyndal Pulley, DO       Note: This dictation was prepared with Dragon dictation along with smaller phrase technology. Any transcriptional errors that result from this process are unintentional.

## 2019-10-27 NOTE — Assessment & Plan Note (Signed)
Discussed with patient that this is been a long time.  Patient will has not had an exacerbation for greater than 5 years.  Elected to try the injection I think will be beneficial.  Meloxicam and home exercises given.  Has Robaxin for breakthrough.  Hopefully patient does relatively well.  Follow-up again in 4 to 8 weeks

## 2019-11-07 ENCOUNTER — Telehealth: Payer: Self-pay

## 2019-11-07 NOTE — Telephone Encounter (Signed)
New message    The patient is asking for 90 days supply does not understand why a 30 days supply was called in    rosuvastatin (CRESTOR) 10 MG tablet  ramipril (ALTACE) 10 MG capsule  FLUoxetine (PROZAC) 40 MG capsule  [facility], [place]

## 2019-11-08 ENCOUNTER — Other Ambulatory Visit: Payer: Self-pay

## 2019-11-08 DIAGNOSIS — F422 Mixed obsessional thoughts and acts: Secondary | ICD-10-CM

## 2019-11-08 DIAGNOSIS — F411 Generalized anxiety disorder: Secondary | ICD-10-CM

## 2019-11-08 DIAGNOSIS — F410 Panic disorder [episodic paroxysmal anxiety] without agoraphobia: Secondary | ICD-10-CM

## 2019-11-08 MED ORDER — RAMIPRIL 10 MG PO CAPS: 10.0000 mg | ORAL_CAPSULE | Freq: Every day | ORAL | 1 refills | Status: DC

## 2019-11-08 MED ORDER — ROSUVASTATIN CALCIUM 10 MG PO TABS: 10.0000 mg | ORAL_TABLET | Freq: Every day | ORAL | 3 refills | Status: DC

## 2019-11-08 MED ORDER — FLUOXETINE HCL 40 MG PO CAPS: 40.0000 mg | ORAL_CAPSULE | Freq: Every day | ORAL | 3 refills | Status: DC

## 2019-11-08 NOTE — Telephone Encounter (Signed)
Sent in to pts pharmacy. 

## 2019-11-29 ENCOUNTER — Other Ambulatory Visit: Payer: Self-pay | Admitting: Internal Medicine

## 2019-11-29 NOTE — Telephone Encounter (Signed)
Please refill as per office routine med refill policy (all routine meds refilled for 3 mo or monthly per pt preference up to one year from last visit, then month to month grace period for 3 mo, then further med refills will have to be denied)  

## 2020-03-17 ENCOUNTER — Other Ambulatory Visit: Payer: Self-pay | Admitting: Internal Medicine

## 2020-04-08 ENCOUNTER — Telehealth (INDEPENDENT_AMBULATORY_CARE_PROVIDER_SITE_OTHER): Payer: BC Managed Care – PPO | Admitting: Family

## 2020-04-08 ENCOUNTER — Other Ambulatory Visit: Payer: Self-pay

## 2020-04-08 ENCOUNTER — Other Ambulatory Visit: Payer: Self-pay | Admitting: Family

## 2020-04-08 DIAGNOSIS — J069 Acute upper respiratory infection, unspecified: Secondary | ICD-10-CM

## 2020-04-08 NOTE — Progress Notes (Signed)
Jacob [name] is a 55 y.o. male with the following history as recorded in EpicCare:  Patient Active Problem List   Diagnosis Date Noted  . Right rotator cuff tear 10/27/2019  . 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  . Sleep disorder 08/23/2018  . Memory loss 08/11/2018  . Dysuria 04/22/2018  . Gross hematuria 04/22/2018  . STD exposure 04/22/2018  . Lack of concentration 12/21/2017  . Panic attacks 12/21/2017  . Obsessional thoughts 12/21/2017  . Acute sinus infection 06/11/2017  . Colon cancer screening 04/09/2017  . Nausea without vomiting 04/09/2017  . Dysphagia 04/09/2017  . Dyspepsia 11/19/2016  . Right otitis media 09/09/2016  . Vertigo 09/09/2016  . Hearing loss, right 09/09/2016  . BPH (benign prostatic hyperplasia) 05/30/2016  . Right shoulder pain 05/30/2016  . Acute midline low back pain with sciatica 04/21/2016  . 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 04/21/2016  . Urinary frequency 02/05/2016  . Syncope 09/27/2015  . Left lateral epicondylitis 08/31/2015  . Allergic rhinitis 08/29/2015  . Bilateral hearing loss 06/05/2015  . Hypersomnolence 06/05/2015  . Chest pain 01/23/2015  . Chronic pain syndrome 01/23/2015  . Hyperlipidemia 10/18/2014  . Impingement syndrome of right shoulder 06/28/2014  . Hip flexor tendon tightness 06/28/2014  . Right knee pain 06/28/2014  . Depression with anxiety 01/30/2013  . Diabetes mellitus without complication ([place]) 0000000  . Encounter for well adult exam with abnormal findings 01/11/2013  . Essential hypertension, benign 01/11/2013  . Low back pain syndrome 01/11/2013  . DJD (degenerative joint disease) 01/11/2013  . Erectile dysfunction 01/11/2013  . GERD (gastroesophageal reflux disease) 12/30/2011    Current Outpatient Medications  Medication Sig Dispense Refill  . ALPRAZolam (XANAX) 0.5 MG tablet TAKE 1 TO 2 TABLETS BY MOUTH AT BEDTIME AS NEEDED 60 tablet 5  . aspirin EC 81 MG  tablet Take 1 tablet (81 mg total) by mouth daily. 90 tablet 11  . fexofenadine (ALLEGRA) 180 MG tablet Take 1 tablet (180 mg total) by mouth daily. 90 tablet 3  . FLUoxetine (PROZAC) 40 MG capsule Take 1 capsule (40 mg total) by mouth daily. 90 capsule 3  . meloxicam (MOBIC) 7.5 MG tablet Take 1 tablet (7.5 mg total) by mouth daily. 30 tablet 0  . metFORMIN (GLUCOPHAGE-XR) 500 MG 24 hr tablet Take 3 tablets (1,500 mg total) by mouth daily with breakfast. 270 tablet 3  . methocarbamol (ROBAXIN) 500 MG tablet Take 1 tablet (500 mg total) by mouth every 8 (eight) hours as needed for muscle spasms. 90 tablet 3  . Multiple Vitamin (MULTIVITAMIN WITH MINERALS) TABS tablet Take 1 tablet by mouth daily.    Marland Kitchen ofloxacin (FLOXIN) 0.3 % OTIC solution INSTILL 5 DROPS INTO LEFT EAR TWICE DAILY FOR 5 DAYS DISCARD REMIANDER    . Omega-3 Fatty Acids (FISH OIL PO) Take 1 capsule by mouth daily.    . pantoprazole (PROTONIX) 40 MG tablet TAKE 1 TABLET BY MOUTH EVERY DAY 90 tablet 3  . ramipril (ALTACE) 10 MG capsule Take 1 capsule (10 mg total) by mouth daily. Annual appt is due in must see provider for future refills 90 capsule 1  . rosuvastatin (CRESTOR) 10 MG tablet Take 1 tablet (10 mg total) by mouth daily. 90 tablet 3  . tadalafil (CIALIS) 20 MG tablet TAKE 1 TABLET BY MOUTH EVERY DAY AS NEEDED 8 tablet 7  . tamsulosin (FLOMAX) 0.4 MG CAPS capsule Take 1 capsule (0.4 mg total) by mouth daily. [place]  capsule 1  . traMADol (ULTRAM) 50 MG tablet Take 1 tablet (50 mg total) by mouth every 6 (six) hours as needed. 30 tablet 0  . triamcinolone (NASACORT) 55 MCG/ACT AERO nasal inhaler Place 2 sprays into the nose daily. 3 Inhaler 3  . Vitamins/Minerals TABS Take by mouth.     No current facility-administered medications for this visit.    Allergies: Patient has no known allergies.  Past Medical History:  Diagnosis Date  . Allergic rhinitis   . Anxiety   . Arthritis   . Cholesteatoma of right ear   . Diabetes  mellitus ([place])    bordreline  . Dysuria   . Enlarged prostate    pt unaware  . GERD (gastroesophageal reflux disease)   . Headache   . Hearing loss    Right ear  . Hernia of abdominal wall   . History of gross hematuria   . History of head injury   . History of kidney stones   . History of palpitations   . Hyperlipidemia 10/18/2014  . Hypertension   . Insomnia   . Lack of concentration   . Nasal fracture    Several  . Otitis media 11/10/2017   Bilateral  . Panic attacks   . Syncope   . Vertigo     Past Surgical History:  Procedure Laterality Date  . CHOLECYSTECTOMY    . CYSTOSCOPY N/A 05/10/2018   Procedure: CYSTOSCOPY WITH EXAM UNDER ANESTHESIA;  Surgeon: Ceasar Mons, MD;  Location: [facility];  Service: Urology;  Laterality: N/A;  ONLY NEEDS 30 MIN  . [place]  . INNER EAR SURGERY     right  . LUMBAR DISC SURGERY    . MASTOIDECTOMY Right   . NOSE SURGERY     Fracture  . tubes in bil ears    . UPPER GI ENDOSCOPY  01/09/2012   Esophageal dilation    Family History  Problem Relation Age of Onset  . COPD Mother   . Hypertension Mother   . Anxiety disorder Mother   . Heart disease Father   . Cancer Father   . Diabetes Paternal Grandmother   . Diabetes Maternal Grandmother   . Anxiety disorder Maternal Grandmother   . Heart attack Brother   . Lung cancer Maternal Uncle   . Anxiety disorder Maternal Uncle   . Anxiety disorder Maternal Aunt   . OCD Maternal Aunt   . Anxiety disorder Cousin   . ADD / ADHD Son   . Drug abuse Son   . Colon cancer Neg Hx   . Esophageal cancer Neg Hx   . Rectal cancer Neg Hx   . Stomach cancer Neg Hx     Social History   Tobacco Use  . Smoking status: Never Smoker  . Smokeless tobacco: Never Used  Substance Use Topics  . Alcohol use: Yes    Alcohol/week: 5.0 standard drinks    Types: 5 Glasses of wine per week    Comment: weekends    Subjective:   I connected with  Jacob [name] on 04/08/20 at  9:00 AM EST by a telephone call and verified that I am speaking with the correct person using two identifiers.   I discussed the limitations of evaluation and management by telemedicine and the availability of in person appointments. The patient expressed understanding and agreed to proceed. Provider in office/ patient is at home; provider and patient are only 2 people on video  call.   Patient started with fever/ body aches on Friday; notes that he is actually feeling better compared to when his symptoms first started but work is requiring a COVID test to return to work. No further body aches/ fever yesterday was down to 99 from 101 and does feel like his sense of taste may be off slightly today; Is fully vaccinated and boosted; Denies any chest pain or shortness of breath;    Objective:  There were no vitals filed for this visit.  Lungs: Respirations unlabored;  Neurologic: Alert and oriented; speech intact;   Assessment:  1. Viral URI     Plan:  Clinically is doing better- symptomatic treatment discussed; will schedule COVID testing today; follow up to be determined.  Time spent 10 minutes  No follow-ups on file.  Orders Placed This Encounter  Procedures  . Novel Coronavirus, NAA ([facility])    Order Specific Question:   Is this test for diagnosis or screening    Answer:   Diagnosis of ill patient    Order Specific Question:   Symptomatic for COVID-19 as defined by CDC    Answer:   Yes    Order Specific Question:   Date of Symptom Onset    Answer:   04/05/2020    Order Specific Question:   Hospitalized for COVID-19    Answer:   No    Order Specific Question:   Admitted to ICU for COVID-19    Answer:   No    Order Specific Question:   Previously tested for COVID-19    Answer:   Unknown    Order Specific Question:   Resident in a congregate (group) care setting    Answer:   No    Order Specific Question:   Is the patient student?    Answer:   No     Order Specific Question:   Employed in healthcare setting    Answer:   No    Order Specific Question:   Has patient completed COVID vaccination(s) (2 doses of Pfizer/Moderna 1 dose of [place])    Answer:   Yes    Requested Prescriptions    No prescriptions requested or ordered in this encounter

## 2020-04-08 NOTE — Progress Notes (Signed)
Pt scheduled for Covid test at 9:40 today.  Arrival instructions given & pt verb understanding.

## 2020-04-09 DIAGNOSIS — Z20822 Contact with and (suspected) exposure to covid-19: Secondary | ICD-10-CM | POA: Diagnosis not present

## 2020-04-09 DIAGNOSIS — U071 COVID-19: Secondary | ICD-10-CM | POA: Diagnosis not present

## 2020-04-10 ENCOUNTER — Ambulatory Visit: Payer: Self-pay | Admitting: *Deleted

## 2020-04-10 ENCOUNTER — Telehealth: Payer: Self-pay | Admitting: Internal Medicine

## 2020-04-10 LAB — NOVEL CORONAVIRUS, NAA: SARS-CoV-2, NAA: DETECTED — AB

## 2020-04-10 LAB — SPECIMEN STATUS REPORT

## 2020-04-10 LAB — SARS-COV-2, NAA 2 DAY TAT

## 2020-04-10 NOTE — Telephone Encounter (Signed)
   Patient calling for COVID test results

## 2020-04-10 NOTE — Telephone Encounter (Signed)
I called him regarding his covid test result.   In his phone message he states he got his positive covid test from [facility].   I do not see where this test has been resulted.   The note states the specimen still needs to be collected.  This test may have been done at Dr. Jerilynn Som office [place] on [address].  I left a message for him to return the call.

## 2020-04-10 NOTE — Telephone Encounter (Signed)
   Patient advised of CDC guideline for quarantine/isolation; 5 days

## 2020-04-10 NOTE — Telephone Encounter (Signed)
Patient got his results back from [facility] and they were positive, patient wondering how long he needs to quarantine for.

## 2020-04-12 DIAGNOSIS — U071 COVID-19: Secondary | ICD-10-CM | POA: Diagnosis not present

## 2020-04-12 DIAGNOSIS — Z20822 Contact with and (suspected) exposure to covid-19: Secondary | ICD-10-CM | POA: Diagnosis not present

## 2020-04-20 DIAGNOSIS — Z0000 Encounter for general adult medical examination without abnormal findings: Secondary | ICD-10-CM | POA: Diagnosis not present

## 2020-04-20 DIAGNOSIS — H6123 Impacted cerumen, bilateral: Secondary | ICD-10-CM | POA: Diagnosis not present

## 2020-04-20 DIAGNOSIS — Z20822 Contact with and (suspected) exposure to covid-19: Secondary | ICD-10-CM | POA: Diagnosis not present

## 2020-04-20 DIAGNOSIS — Z136 Encounter for screening for cardiovascular disorders: Secondary | ICD-10-CM | POA: Diagnosis not present

## 2020-04-20 DIAGNOSIS — Z13220 Encounter for screening for lipoid disorders: Secondary | ICD-10-CM | POA: Diagnosis not present

## 2020-04-20 DIAGNOSIS — Z13228 Encounter for screening for other metabolic disorders: Secondary | ICD-10-CM | POA: Diagnosis not present

## 2020-04-20 DIAGNOSIS — E119 Type 2 diabetes mellitus without complications: Secondary | ICD-10-CM | POA: Diagnosis not present

## 2020-05-01 DIAGNOSIS — R942 Abnormal results of pulmonary function studies: Secondary | ICD-10-CM | POA: Diagnosis not present

## 2020-05-01 DIAGNOSIS — U099 Post covid-19 condition, unspecified: Secondary | ICD-10-CM | POA: Diagnosis not present

## 2020-05-01 DIAGNOSIS — U071 COVID-19: Secondary | ICD-10-CM | POA: Diagnosis not present

## 2020-05-04 DIAGNOSIS — U099 Post covid-19 condition, unspecified: Secondary | ICD-10-CM | POA: Diagnosis not present

## 2020-05-04 DIAGNOSIS — E119 Type 2 diabetes mellitus without complications: Secondary | ICD-10-CM | POA: Diagnosis not present

## 2020-05-04 DIAGNOSIS — E7800 Pure hypercholesterolemia, unspecified: Secondary | ICD-10-CM | POA: Diagnosis not present

## 2020-05-04 DIAGNOSIS — F32A Depression, unspecified: Secondary | ICD-10-CM | POA: Diagnosis not present

## 2020-05-08 ENCOUNTER — Other Ambulatory Visit: Payer: Self-pay | Admitting: Internal Medicine

## 2020-05-08 NOTE — Telephone Encounter (Signed)
Please to call pt -   1 mo xanax done erx  Please to make rov for further refills

## 2020-05-10 NOTE — Telephone Encounter (Signed)
Pt notified and appt scheduled.

## 2020-05-29 DIAGNOSIS — Z7982 Long term (current) use of aspirin: Secondary | ICD-10-CM | POA: Diagnosis not present

## 2020-05-29 DIAGNOSIS — Z79899 Other long term (current) drug therapy: Secondary | ICD-10-CM | POA: Diagnosis not present

## 2020-05-29 DIAGNOSIS — G8911 Acute pain due to trauma: Secondary | ICD-10-CM | POA: Diagnosis not present

## 2020-05-29 DIAGNOSIS — Y9389 Activity, other specified: Secondary | ICD-10-CM | POA: Diagnosis not present

## 2020-05-29 DIAGNOSIS — E119 Type 2 diabetes mellitus without complications: Secondary | ICD-10-CM | POA: Diagnosis not present

## 2020-05-29 DIAGNOSIS — S0081XA Abrasion of other part of head, initial encounter: Secondary | ICD-10-CM | POA: Diagnosis not present

## 2020-05-29 DIAGNOSIS — S060X1A Concussion with loss of consciousness of 30 minutes or less, initial encounter: Secondary | ICD-10-CM | POA: Diagnosis not present

## 2020-05-29 DIAGNOSIS — G44319 Acute post-traumatic headache, not intractable: Secondary | ICD-10-CM | POA: Diagnosis not present

## 2020-05-29 DIAGNOSIS — W208XXA Other cause of strike by thrown, projected or falling object, initial encounter: Secondary | ICD-10-CM | POA: Diagnosis not present

## 2020-05-29 DIAGNOSIS — S0990XA Unspecified injury of head, initial encounter: Secondary | ICD-10-CM | POA: Diagnosis not present

## 2020-05-29 DIAGNOSIS — Z7984 Long term (current) use of oral hypoglycemic drugs: Secondary | ICD-10-CM | POA: Diagnosis not present
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
## 2020-05-31 ENCOUNTER — Encounter: Payer: Self-pay | Admitting: Internal Medicine

## 2020-05-31 ENCOUNTER — Ambulatory Visit: Payer: BC Managed Care – PPO | Admitting: Internal Medicine

## 2020-05-31 ENCOUNTER — Other Ambulatory Visit: Payer: Self-pay

## 2020-05-31 VITALS — BP 140/82 | HR 81 | Ht 72.0 in | Wt 186.0 lb

## 2020-05-31 DIAGNOSIS — E119 Type 2 diabetes mellitus without complications: Secondary | ICD-10-CM

## 2020-05-31 DIAGNOSIS — F4321 Adjustment disorder with depressed mood: Secondary | ICD-10-CM

## 2020-05-31 DIAGNOSIS — I10 Essential (primary) hypertension: Secondary | ICD-10-CM | POA: Diagnosis not present

## 2020-05-31 DIAGNOSIS — R42 Dizziness and giddiness: Secondary | ICD-10-CM

## 2020-05-31 DIAGNOSIS — E7800 Pure hypercholesterolemia, unspecified: Secondary | ICD-10-CM | POA: Diagnosis not present

## 2020-05-31 DIAGNOSIS — F411 Generalized anxiety disorder: Secondary | ICD-10-CM

## 2020-05-31 DIAGNOSIS — F422 Mixed obsessional thoughts and acts: Secondary | ICD-10-CM

## 2020-05-31 DIAGNOSIS — F418 Other specified anxiety disorders: Secondary | ICD-10-CM

## 2020-05-31 DIAGNOSIS — F410 Panic disorder [episodic paroxysmal anxiety] without agoraphobia: Secondary | ICD-10-CM

## 2020-05-31 MED ORDER — METFORMIN HCL ER 500 MG PO TB24: 1500.0000 mg | ORAL_TABLET | Freq: Every day | ORAL | 3 refills | Status: DC

## 2020-05-31 MED ORDER — PANTOPRAZOLE SODIUM 40 MG PO TBEC: 40.0000 mg | DELAYED_RELEASE_TABLET | Freq: Every day | ORAL | 3 refills | Status: DC

## 2020-05-31 MED ORDER — TAMSULOSIN HCL 0.4 MG PO CAPS: 0.4000 mg | ORAL_CAPSULE | Freq: Every day | ORAL | 3 refills | Status: DC

## 2020-05-31 MED ORDER — RAMIPRIL 10 MG PO CAPS: 10.0000 mg | ORAL_CAPSULE | Freq: Every day | ORAL | 3 refills | Status: DC

## 2020-05-31 MED ORDER — ROSUVASTATIN CALCIUM 10 MG PO TABS: 10.0000 mg | ORAL_TABLET | Freq: Every day | ORAL | 3 refills | Status: DC

## 2020-05-31 MED ORDER — TRAMADOL HCL 50 MG PO TABS: 50.0000 mg | ORAL_TABLET | Freq: Four times a day (QID) | ORAL | 2 refills | Status: DC | PRN

## 2020-05-31 MED ORDER — FLUOXETINE HCL 40 MG PO CAPS
40.0000 mg | ORAL_CAPSULE | Freq: Every day | ORAL | 3 refills | Status: DC
Start: 2020-05-31 — End: 2020-09-04

## 2020-05-31 MED ORDER — MELOXICAM 7.5 MG PO TABS: 7.5000 mg | ORAL_TABLET | Freq: Every day | ORAL | 5 refills | Status: DC

## 2020-05-31 NOTE — Progress Notes (Signed)
Patient ID: Jacob [name], male   DOB: 22-Feb-1966, 55 y.o.   MRN: [id number]        Chief Complaint: grief, anxiety/depression, vertigo recurrent, DM, htn, hld       HPI:  [name] is a 55 y.o. male here after son died January 04, 2020 with overdose, states he had been doing so well prior, cant explain how this happened.  Still grieving, hard to stop, and anxiety/depression has been overall worse as well.  Denies SI or HI, and has been getting church counseling therapy, seems to think this is helping, and does not ask to for further referrals or tx.  Does have ongoing vertigo, worse last wk with allergy symptom flare, but has resolved in last 3 days.  Denies fever, pain, ha and denies new focal neuro s/s.  Asks for antivert refill.  Did also have an a1c at work about 6 wks ago at 7.8, so has been taking 4 metformin per day.  Asks for f/u lab.  Pt denies chest pain, increased sob or doe, wheezing, orthopnea, PND, increased LE swelling, palpitations, dizziness or syncope.   Pt denies polydipsia, polyuria, or low sugar symptoms.  Pt states overall good compliance with meds, trying to follow lower cholesterol, diabetic diet, wt overall stable but little exercise however.    Wt Readings from Last 3 Encounters:  05/31/20 186 lb (84.4 kg)  10/27/19 187 lb (84.8 kg)  08/21/19 198 lb (89.8 kg)   BP Readings from Last 3 Encounters:  05/31/20 140/82  10/27/19 110/72  08/21/19 132/80         Past Medical History:  Diagnosis Date  . Allergic rhinitis   . Anxiety   . Arthritis   . Cholesteatoma of right ear   . Diabetes mellitus ([place])    bordreline  . Dysuria   . Enlarged prostate    pt unaware  . GERD (gastroesophageal reflux disease)   . Headache   . Hearing loss    Right ear  . Hernia of abdominal wall   . History of gross hematuria   . History of head injury   . History of kidney stones   . History of palpitations   . Hyperlipidemia 10/18/2014  . Hypertension   . Insomnia   . Lack of  concentration   . Nasal fracture    Several  . Otitis media 11/10/2017   Bilateral  . Panic attacks   . Syncope   . Vertigo    Past Surgical History:  Procedure Laterality Date  . CHOLECYSTECTOMY    . CYSTOSCOPY N/A 05/10/2018   Procedure: CYSTOSCOPY WITH EXAM UNDER ANESTHESIA;  Surgeon: Ceasar Mons, MD;  Location: [facility];  Service: Urology;  Laterality: N/A;  ONLY NEEDS 30 MIN  . [place]  . INNER EAR SURGERY     right  . LUMBAR DISC SURGERY    . MASTOIDECTOMY Right   . NOSE SURGERY     Fracture  . tubes in bil ears    . UPPER GI ENDOSCOPY  01/09/2012   Esophageal dilation    reports that he has never smoked. He has never used smokeless tobacco. He reports current alcohol use of about 5.0 standard drinks of alcohol per week. He reports that he does not use drugs. family history includes ADD / ADHD in his son; Anxiety disorder in his cousin, maternal aunt, maternal grandmother, maternal uncle, and mother; COPD in his mother; Cancer in his  father; Diabetes in his maternal grandmother and paternal grandmother; Drug abuse in his son; Heart attack in his brother; Heart disease in his father; Hypertension in his mother; Lung cancer in his maternal uncle; OCD in his maternal aunt. No Known Allergies Current Outpatient Medications on File Prior to Visit  Medication Sig Dispense Refill  . ALPRAZolam (XANAX) 0.5 MG tablet TAKE 1 TO 2 TABLETS BY MOUTH AT BEDTIME AS NEEDED 60 tablet 0  . aspirin EC 81 MG tablet Take 1 tablet (81 mg total) by mouth daily. 90 tablet 11  . Multiple Vitamin (MULTIVITAMIN WITH MINERALS) TABS tablet Take 1 tablet by mouth daily.    . Omega-3 Fatty Acids (FISH OIL PO) Take 1 capsule by mouth daily.    . tadalafil (CIALIS) 20 MG tablet TAKE 1 TABLET BY MOUTH EVERY DAY AS NEEDED 8 tablet 7  . triamcinolone (NASACORT) 55 MCG/ACT AERO nasal inhaler Place 2 sprays into the nose daily. 3 Inhaler 3  .  Vitamins/Minerals TABS Take by mouth.    . fexofenadine (ALLEGRA) 180 MG tablet Take 1 tablet (180 mg total) by mouth daily. 90 tablet 3   No current facility-administered medications on file prior to visit.        ROS:  All others reviewed and negative.  Objective        PE:  BP 140/82   Pulse 81   Ht 6' (1.829 m)   Wt 186 lb (84.4 kg)   SpO2 92%   BMI 25.23 kg/m                 Constitutional: Pt appears in NAD               HENT: Head: NCAT.                Right Ear: External ear normal.                 Left Ear: External ear normal.                Eyes: . Pupils are equal, round, and reactive to light. Conjunctivae and EOM are normal               Nose: without d/c or deformity               Neck: Neck supple. Gross normal ROM               Cardiovascular: Normal rate and regular rhythm.                 Pulmonary/Chest: Effort normal and breath sounds without rales or wheezing.                Abd:  Soft, NT, ND, + BS, no organomegaly               Neurological: Pt is alert. At baseline orientation, motor grossly intact               Skin: Skin is warm. No rashes, no other new lesions, LE edema - none               Psychiatric: Pt behavior is normal without agitation   Micro: none  Cardiac tracings I have personally interpreted today:  none  Pertinent Radiological findings (summarize): none   Lab Results  Component Value Date   WBC 4.8 03/09/2019   HGB 16.0 03/09/2019   HCT 47.9 03/09/2019   PLT 220 03/09/2019  GLUCOSE 275 (H) 05/12/2019   CHOL 143 05/12/2019   TRIG 92.0 05/12/2019   HDL 56.80 05/12/2019   LDLDIRECT 102.0 11/23/2016   LDLCALC 68 05/12/2019   ALT 27 05/12/2019   AST 19 05/12/2019   NA 138 05/12/2019   K 4.4 05/12/2019   CL 104 05/12/2019   CREATININE 1.21 05/12/2019   BUN 14 05/12/2019   CO2 27 05/12/2019   TSH 1.19 08/10/2018   PSA 1.07 08/10/2018   INR 0.96 12/11/2017   HGBA1C 7.3 (A) 08/21/2019   MICROALBUR <0.7 10/08/2017    Assessment/Plan:  Jacob [name] is a 55 y.o. White or Caucasian [1] male with  has a past medical history of Allergic rhinitis, Anxiety, Arthritis, Cholesteatoma of right ear, Diabetes mellitus ([place]), Dysuria, Enlarged prostate, GERD (gastroesophageal reflux disease), Headache, Hearing loss, Hernia of abdominal wall, History of gross hematuria, History of head injury, History of kidney stones, History of palpitations, Hyperlipidemia (10/18/2014), Hypertension, Insomnia, Lack of concentration, Nasal fracture, Otitis media (11/10/2017), Panic attacks, Syncope, and Vertigo.  Depression with anxiety With mild worsening recenlty, urged med compliacne and cont f/u with church counselin therapy  Grief Persistent possibly prolonged, declines referral for grief counseling today  Hyperlipidemia Lab Results  Component Value Date   LDLCALC 68 05/12/2019   Stable, pt to continue current statin crestor 10   Essential hypertension, benign BP Readings from Last 3 Encounters:  05/31/20 140/82  10/27/19 110/72  08/21/19 132/80   Stable, pt to continue medical treatment altace  Current Outpatient Medications (Endocrine & Metabolic):  .  metFORMIN (GLUCOPHAGE-XR) 500 MG 24 hr tablet, Take 3 tablets (1,500 mg total) by mouth daily with breakfast.  Current Outpatient Medications (Cardiovascular):  .  tadalafil (CIALIS) 20 MG tablet, TAKE 1 TABLET BY MOUTH EVERY DAY AS NEEDED .  ramipril (ALTACE) 10 MG capsule, Take 1 capsule (10 mg total) by mouth daily. Annual appt is due in must see provider for future refills .  rosuvastatin (CRESTOR) 10 MG tablet, Take 1 tablet (10 mg total) by mouth daily.  Current Outpatient Medications (Respiratory):  .  triamcinolone (NASACORT) 55 MCG/ACT AERO nasal inhaler, Place 2 sprays into the nose daily. .  fexofenadine (ALLEGRA) 180 MG tablet, Take 1 tablet (180 mg total) by mouth daily.  Current Outpatient Medications (Analgesics):  .  aspirin EC 81 MG tablet,  Take 1 tablet (81 mg total) by mouth daily. .  meloxicam (MOBIC) 7.5 MG tablet, Take 1 tablet (7.5 mg total) by mouth daily. .  traMADol (ULTRAM) 50 MG tablet, Take 1 tablet (50 mg total) by mouth every 6 (six) hours as needed.   Current Outpatient Medications (Other):  Marland Kitchen  ALPRAZolam (XANAX) 0.5 MG tablet, TAKE 1 TO 2 TABLETS BY MOUTH AT BEDTIME AS NEEDED .  Multiple Vitamin (MULTIVITAMIN WITH MINERALS) TABS tablet, Take 1 tablet by mouth daily. .  Omega-3 Fatty Acids (FISH OIL PO), Take 1 capsule by mouth daily. .  Vitamins/Minerals TABS, Take by mouth. Marland Kitchen  FLUoxetine (PROZAC) 40 MG capsule, Take 1 capsule (40 mg total) by mouth daily. .  pantoprazole (PROTONIX) 40 MG tablet, Take 1 tablet (40 mg total) by mouth daily. .  tamsulosin (FLOMAX) 0.4 MG CAPS capsule, Take 1 capsule (0.4 mg total) by mouth daily.   Diabetes mellitus without complication ([place]) For f/u a1c with labs today, cont 4 metformin for now, cont diet and wt control  Vertigo Recurrent, exam benign, for antivert refill  Followup: Return in about 4 months (around 09/30/2020).  Cathlean Cower, MD 06/02/2020 5:18 PM [facility]

## 2020-05-31 NOTE — Patient Instructions (Addendum)
Ok to increase the metformin to 4 tabs in the AM  Please continue all other medications as before, and refills have been done if requested. - the antivert  Please have the pharmacy call with any other refills you may need.  Please continue your efforts at being more active, low cholesterol diabetic diet, and weight control.  Please keep your appointments with your specialists as you may have planned  Please go to the LAB at the blood drawing area for the tests to be done - at the [facility] site next week  Please make an Appointment to return in 4 months, or sooner if needed

## 2020-06-02 ENCOUNTER — Encounter: Payer: Self-pay | Admitting: Internal Medicine

## 2020-06-02 DIAGNOSIS — F4321 Adjustment disorder with depressed mood: Secondary | ICD-10-CM | POA: Insufficient documentation

## 2020-06-02 NOTE — Assessment & Plan Note (Signed)
Recurrent, exam benign, for antivert refill

## 2020-06-02 NOTE — Assessment & Plan Note (Signed)
For f/u a1c with labs today, cont 4 metformin for now, cont diet and wt control

## 2020-06-02 NOTE — Assessment & Plan Note (Signed)
Persistent possibly prolonged, declines referral for grief counseling today

## 2020-06-02 NOTE — Assessment & Plan Note (Signed)
Lab Results  Component Value Date   LDLCALC 68 05/12/2019   Stable, pt to continue current statin crestor 10

## 2020-06-02 NOTE — Assessment & Plan Note (Signed)
With mild worsening recenlty, urged med compliacne and cont f/u with church counselin therapy

## 2020-06-02 NOTE — Assessment & Plan Note (Signed)
BP Readings from Last 3 Encounters:  05/31/20 140/82  10/27/19 110/72  08/21/19 132/80   Stable, pt to continue medical treatment altace  Current Outpatient Medications (Endocrine & Metabolic):  .  metFORMIN (GLUCOPHAGE-XR) 500 MG 24 hr tablet, Take 3 tablets (1,500 mg total) by mouth daily with breakfast.  Current Outpatient Medications (Cardiovascular):  .  tadalafil (CIALIS) 20 MG tablet, TAKE 1 TABLET BY MOUTH EVERY DAY AS NEEDED .  ramipril (ALTACE) 10 MG capsule, Take 1 capsule (10 mg total) by mouth daily. Annual appt is due in must see provider for future refills .  rosuvastatin (CRESTOR) 10 MG tablet, Take 1 tablet (10 mg total) by mouth daily.  Current Outpatient Medications (Respiratory):  .  triamcinolone (NASACORT) 55 MCG/ACT AERO nasal inhaler, Place 2 sprays into the nose daily. .  fexofenadine (ALLEGRA) 180 MG tablet, Take 1 tablet (180 mg total) by mouth daily.  Current Outpatient Medications (Analgesics):  .  aspirin EC 81 MG tablet, Take 1 tablet (81 mg total) by mouth daily. .  meloxicam (MOBIC) 7.5 MG tablet, Take 1 tablet (7.5 mg total) by mouth daily. .  traMADol (ULTRAM) 50 MG tablet, Take 1 tablet (50 mg total) by mouth every 6 (six) hours as needed.   Current Outpatient Medications (Other):  Marland Kitchen  ALPRAZolam (XANAX) 0.5 MG tablet, TAKE 1 TO 2 TABLETS BY MOUTH AT BEDTIME AS NEEDED .  Multiple Vitamin (MULTIVITAMIN WITH MINERALS) TABS tablet, Take 1 tablet by mouth daily. .  Omega-3 Fatty Acids (FISH OIL PO), Take 1 capsule by mouth daily. .  Vitamins/Minerals TABS, Take by mouth. Marland Kitchen  FLUoxetine (PROZAC) 40 MG capsule, Take 1 capsule (40 mg total) by mouth daily. .  pantoprazole (PROTONIX) 40 MG tablet, Take 1 tablet (40 mg total) by mouth daily. .  tamsulosin (FLOMAX) 0.4 MG CAPS capsule, Take 1 capsule (0.4 mg total) by mouth daily.

## 2020-06-06 ENCOUNTER — Telehealth: Payer: Self-pay | Admitting: Internal Medicine

## 2020-06-06 NOTE — Telephone Encounter (Signed)
1.Medication Requested: ALPRAZolam (XANAX) 0.5 MG tablet  pantoprazole (PROTONIX) 40 MG tablet    2. Pharmacy (Name, Street, [place]): [id number] - [place], [place]  3. On Med List: yes   4. Last Visit with PCP: 3.4.22  5. Next visit date with PCP: 3.21.22   Patient also said that he discussed increasing the dosage of metFORMIN (GLUCOPHAGE-XR) 500 MG 24 hr tablet to 4 times daily     Agent: Please be advised that RX refills may take up to 3 business days. We ask that you follow-up with your pharmacy.

## 2020-06-10 MED ORDER — ALPRAZOLAM 0.5 MG PO TABS: 0.5000 mg | ORAL_TABLET | Freq: Every evening | ORAL | 2 refills | Status: DC | PRN

## 2020-06-12 ENCOUNTER — Ambulatory Visit: Payer: BC Managed Care – PPO | Admitting: Internal Medicine

## 2020-06-12 ENCOUNTER — Telehealth: Payer: Self-pay | Admitting: *Deleted

## 2020-06-12 NOTE — Telephone Encounter (Signed)
Rec'd fax needing PA for Pantoprazole. Completed via cover-my-meds w/ Key: [id number] - Rx #: [id number]. Waiting on insurance response.Marland KitchenJohny Chess

## 2020-06-12 NOTE — Telephone Encounter (Signed)
Rec'd notification back med has been approved today with Effective dates from 06/12/2020 through 06/11/2021. [facility]

## 2020-06-17 ENCOUNTER — Other Ambulatory Visit: Payer: Self-pay | Admitting: Internal Medicine

## 2020-06-17 ENCOUNTER — Ambulatory Visit: Payer: BC Managed Care – PPO | Admitting: Internal Medicine

## 2020-06-17 ENCOUNTER — Other Ambulatory Visit (INDEPENDENT_AMBULATORY_CARE_PROVIDER_SITE_OTHER): Payer: BC Managed Care – PPO

## 2020-06-17 DIAGNOSIS — E119 Type 2 diabetes mellitus without complications: Secondary | ICD-10-CM

## 2020-06-17 LAB — LIPID PANEL
Cholesterol: 147 mg/dL (ref 0–200)
HDL: 50.5 mg/dL (ref >39.00)
LDL Cholesterol: 59 mg/dL (ref 0–99)
NonHDL: 96.96
Total CHOL/HDL Ratio: 3
Triglycerides: 189 mg/dL — ABNORMAL HIGH (ref 0.0–149.0)
VLDL: 37.8 mg/dL (ref 0.0–40.0)

## 2020-06-17 LAB — BASIC METABOLIC PANEL
BUN: 14 mg/dL (ref 6–23)
CO2: 29 mEq/L (ref 19–32)
Calcium: 9.7 mg/dL (ref 8.4–10.5)
Chloride: 104 mEq/L (ref 96–112)
Creatinine, Ser: 1.25 mg/dL (ref 0.40–1.50)
GFR: 65.87 mL/min (ref >60.00)
Glucose, Bld: 134 mg/dL — ABNORMAL HIGH (ref 70–99)
Potassium: 4.4 mEq/L (ref 3.5–5.1)
Sodium: 141 mEq/L (ref 135–145)

## 2020-06-17 LAB — HEPATIC FUNCTION PANEL
ALT: 25 U/L (ref 0–53)
AST: 16 U/L (ref 0–37)
Albumin: 4.6 g/dL (ref 3.5–5.2)
Alkaline Phosphatase: 62 U/L (ref 39–117)
Bilirubin, Direct: 0.1 mg/dL (ref 0.0–0.3)
Total Bilirubin: 0.5 mg/dL (ref 0.2–1.2)
Total Protein: 7.2 g/dL (ref 6.0–8.3)

## 2020-06-17 LAB — HEMOGLOBIN A1C: Hgb A1c MFr Bld: 7.3 % — ABNORMAL HIGH (ref 4.6–6.5)

## 2020-06-17 MED ORDER — METFORMIN HCL ER 500 MG PO TB24: 2000.0000 mg | ORAL_TABLET | Freq: Every day | ORAL | 3 refills | Status: DC

## 2020-07-24 ENCOUNTER — Ambulatory Visit: Payer: BC Managed Care – PPO | Admitting: Internal Medicine

## 2020-07-24 ENCOUNTER — Encounter: Payer: Self-pay | Admitting: Internal Medicine

## 2020-07-24 ENCOUNTER — Other Ambulatory Visit: Payer: Self-pay

## 2020-07-24 VITALS — BP 118/70 | HR 75 | Temp 98.2°F | Ht 72.0 in | Wt 189.6 lb

## 2020-07-24 DIAGNOSIS — G4710 Hypersomnia, unspecified: Secondary | ICD-10-CM | POA: Diagnosis not present

## 2020-07-24 DIAGNOSIS — Z0001 Encounter for general adult medical examination with abnormal findings: Secondary | ICD-10-CM

## 2020-07-24 DIAGNOSIS — E119 Type 2 diabetes mellitus without complications: Secondary | ICD-10-CM

## 2020-07-24 DIAGNOSIS — G4700 Insomnia, unspecified: Secondary | ICD-10-CM | POA: Diagnosis not present

## 2020-07-24 DIAGNOSIS — R42 Dizziness and giddiness: Secondary | ICD-10-CM

## 2020-07-24 DIAGNOSIS — J309 Allergic rhinitis, unspecified: Secondary | ICD-10-CM

## 2020-07-24 DIAGNOSIS — K219 Gastro-esophageal reflux disease without esophagitis: Secondary | ICD-10-CM

## 2020-07-24 DIAGNOSIS — E7800 Pure hypercholesterolemia, unspecified: Secondary | ICD-10-CM

## 2020-07-24 DIAGNOSIS — F418 Other specified anxiety disorders: Secondary | ICD-10-CM

## 2020-07-24 MED ORDER — MECLIZINE HCL 12.5 MG PO TABS: 12.5000 mg | ORAL_TABLET | Freq: Three times a day (TID) | ORAL | 1 refills | Status: DC | PRN

## 2020-07-24 MED ORDER — METFORMIN HCL ER 500 MG PO TB24: 2000.0000 mg | ORAL_TABLET | Freq: Every day | ORAL | 3 refills | Status: DC

## 2020-07-24 MED ORDER — ESOMEPRAZOLE MAGNESIUM 40 MG PO CPDR: 40.0000 mg | DELAYED_RELEASE_CAPSULE | Freq: Every day | ORAL | 3 refills | Status: DC

## 2020-07-24 MED ORDER — ONDANSETRON HCL 4 MG PO TABS: 4.0000 mg | ORAL_TABLET | Freq: Three times a day (TID) | ORAL | 2 refills | Status: DC | PRN

## 2020-07-24 NOTE — Patient Instructions (Signed)
Please take all new medication as prescribed - the nexium, zofran and meclizine as needed  Ok to try the Melatonin for sleep if needed  Please continue all other medications as before, and refills have been done if requested.  Please have the pharmacy call with any other refills you may need.  Please continue your efforts at being more active, low cholesterol diet, and weight control.  You are otherwise up to date with prevention measures today.  You will be contacted regarding the referral for: pulmonary  Please keep your appointments with your specialists as you may have planned  Please make an Appointment to return in 6 months, or sooner if needed

## 2020-07-24 NOTE — Progress Notes (Deleted)
Patient ID: Jacob [name], male   DOB: 01-16-1966, 55 y.o.   MRN: [id number]        Chief Complaint: follow up HTN, HLD and hyperglycemia ***       HPI:  Jacob [name] is a 55 y.o. male here with c/o        Wt Readings from Last 3 Encounters:  07/24/20 189 lb 9.6 oz (86 kg)  05/31/20 186 lb (84.4 kg)  10/27/19 187 lb (84.8 kg)   BP Readings from Last 3 Encounters:  07/24/20 118/70  05/31/20 140/82  10/27/19 110/72         Past Medical History:  Diagnosis Date  . Allergic rhinitis   . Anxiety   . Arthritis   . Cholesteatoma of right ear   . Diabetes mellitus ([place])    bordreline  . Dysuria   . Enlarged prostate    pt unaware  . GERD (gastroesophageal reflux disease)   . Headache   . Hearing loss    Right ear  . Hernia of abdominal wall   . History of gross hematuria   . History of head injury   . History of kidney stones   . History of palpitations   . Hyperlipidemia 10/18/2014  . Hypertension   . Insomnia   . Lack of concentration   . Nasal fracture    Several  . Otitis media 11/10/2017   Bilateral  . Panic attacks   . Syncope   . Vertigo    Past Surgical History:  Procedure Laterality Date  . CHOLECYSTECTOMY    . CYSTOSCOPY N/A 05/10/2018   Procedure: CYSTOSCOPY WITH EXAM UNDER ANESTHESIA;  Surgeon: Ceasar Mons, MD;  Location: [facility];  Service: Urology;  Laterality: N/A;  ONLY NEEDS 30 MIN  . [place]  . INNER EAR SURGERY     right  . LUMBAR DISC SURGERY    . MASTOIDECTOMY Right   . NOSE SURGERY     Fracture  . tubes in bil ears    . UPPER GI ENDOSCOPY  01/09/2012   Esophageal dilation    reports that he has never smoked. He has never used smokeless tobacco. He reports current alcohol use of about 5.0 standard drinks of alcohol per week. He reports that he does not use drugs. family history includes ADD / ADHD in his son; Anxiety disorder in his cousin, maternal aunt, maternal grandmother,  maternal uncle, and mother; COPD in his mother; Cancer in his father; Diabetes in his maternal grandmother and paternal grandmother; Drug abuse in his son; Heart attack in his brother; Heart disease in his father; Hypertension in his mother; Lung cancer in his maternal uncle; OCD in his maternal aunt. No Known Allergies Current Outpatient Medications on File Prior to Visit  Medication Sig Dispense Refill  . ALPRAZolam (XANAX) 0.5 MG tablet Take 1-2 tablets (0.5-1 mg total) by mouth at bedtime as needed. 60 tablet 2  . aspirin EC 81 MG tablet Take 1 tablet (81 mg total) by mouth daily. 90 tablet 11  . fexofenadine (ALLEGRA) 180 MG tablet Take 1 tablet (180 mg total) by mouth daily. 90 tablet 3  . FLUoxetine (PROZAC) 40 MG capsule Take 1 capsule (40 mg total) by mouth daily. 90 capsule 3  . meloxicam (MOBIC) 7.5 MG tablet Take 1 tablet (7.5 mg total) by mouth daily. 30 tablet 5  . metFORMIN (GLUCOPHAGE-XR) 500 MG 24 hr tablet Take 4 tablets (2,000 mg  total) by mouth daily with breakfast. 360 tablet 3  . Multiple Vitamin (MULTIVITAMIN WITH MINERALS) TABS tablet Take 1 tablet by mouth daily.    . Omega-3 Fatty Acids (FISH OIL PO) Take 1 capsule by mouth daily.    . pantoprazole (PROTONIX) 40 MG tablet Take 1 tablet (40 mg total) by mouth daily. 90 tablet 3  . ramipril (ALTACE) 10 MG capsule Take 1 capsule (10 mg total) by mouth daily. Annual appt is due in must see provider for future refills 90 capsule 3  . rosuvastatin (CRESTOR) 10 MG tablet Take 1 tablet (10 mg total) by mouth daily. 90 tablet 3  . tadalafil (CIALIS) 20 MG tablet TAKE 1 TABLET BY MOUTH EVERY DAY AS NEEDED 8 tablet 7  . tamsulosin (FLOMAX) 0.4 MG CAPS capsule Take 1 capsule (0.4 mg total) by mouth daily. 90 capsule 3  . traMADol (ULTRAM) 50 MG tablet Take 1 tablet (50 mg total) by mouth every 6 (six) hours as needed. 30 tablet 2  . triamcinolone (NASACORT) 55 MCG/ACT AERO nasal inhaler Place 2 sprays into the nose daily. 3 Inhaler 3   . Vitamins/Minerals TABS Take by mouth.     No current facility-administered medications on file prior to visit.        ROS:  All others reviewed and negative.  Objective        PE:  BP 118/70 (BP Location: Left Arm, Patient Position: Sitting, Cuff Size: Large)   Pulse 75   Temp 98.2 F (36.8 C) (Oral)   Ht 6' (1.829 m)   Wt 189 lb 9.6 oz (86 kg)   SpO2 97%   BMI 25.71 kg/m                 Constitutional: Pt appears in NAD               HENT: Head: NCAT.                Right Ear: External ear normal.                 Left Ear: External ear normal.                Eyes: . Pupils are equal, round, and reactive to light. Conjunctivae and EOM are normal               Nose: without d/c or deformity               Neck: Neck supple. Gross normal ROM               Cardiovascular: Normal rate and regular rhythm.                 Pulmonary/Chest: Effort normal and breath sounds without rales or wheezing.                Abd:  Soft, NT, ND, + BS, no organomegaly               Neurological: Pt is alert. At baseline orientation, motor grossly intact               Skin: Skin is warm. No rashes, no other new lesions, LE edema - ***               Psychiatric: Pt behavior is normal without agitation   Micro: none  Cardiac tracings I have personally interpreted today:  none  Pertinent Radiological findings (summarize): none   Lab  Results  Component Value Date   WBC 4.8 03/09/2019   HGB 16.0 03/09/2019   HCT 47.9 03/09/2019   PLT 220 03/09/2019   GLUCOSE 134 (H) 06/17/2020   CHOL 147 06/17/2020   TRIG 189.0 (H) 06/17/2020   HDL 50.50 06/17/2020   LDLDIRECT 102.0 11/23/2016   LDLCALC 59 06/17/2020   ALT 25 06/17/2020   AST 16 06/17/2020   NA 141 06/17/2020   K 4.4 06/17/2020   CL 104 06/17/2020   CREATININE 1.25 06/17/2020   BUN 14 06/17/2020   CO2 29 06/17/2020   TSH 1.19 08/10/2018   PSA 1.07 08/10/2018   INR 0.96 12/11/2017   HGBA1C 7.3 (H) 06/17/2020   MICROALBUR <0.7  10/08/2017   Assessment/Plan:  Jacob [name] is a 55 y.o. White or Caucasian [1] male with  has a past medical history of Allergic rhinitis, Anxiety, Arthritis, Cholesteatoma of right ear, Diabetes mellitus ([place]), Dysuria, Enlarged prostate, GERD (gastroesophageal reflux disease), Headache, Hearing loss, Hernia of abdominal wall, History of gross hematuria, History of head injury, History of kidney stones, History of palpitations, Hyperlipidemia (10/18/2014), Hypertension, Insomnia, Lack of concentration, Nasal fracture, Otitis media (11/10/2017), Panic attacks, Syncope, and Vertigo.  No problem-specific Assessment & Plan notes found for this encounter.  Followup: No follow-ups on file.  Cathlean Cower, MD 07/24/2020 2:14 PM [facility]

## 2020-07-28 ENCOUNTER — Encounter: Payer: Self-pay | Admitting: Internal Medicine

## 2020-07-28 NOTE — Assessment & Plan Note (Signed)
Cant r/o osa - for pulm referral 

## 2020-07-28 NOTE — Assessment & Plan Note (Signed)
Mild to mod, for melatonin otc prn  to f/u any worsening symptoms or concerns

## 2020-07-28 NOTE — Assessment & Plan Note (Signed)
Lab Results  Component Value Date   HGBA1C 7.3 (H) 06/17/2020   Mild over goal, for now pt to continue current medical treatment - metformin  Current Outpatient Medications (Endocrine & Metabolic):  .  metFORMIN (GLUCOPHAGE-XR) 500 MG 24 hr tablet, Take 4 tablets (2,000 mg total) by mouth daily with breakfast.  Current Outpatient Medications (Cardiovascular):  .  ramipril (ALTACE) 10 MG capsule, Take 1 capsule (10 mg total) by mouth daily. Annual appt is due in must see provider for future refills .  rosuvastatin (CRESTOR) 10 MG tablet, Take 1 tablet (10 mg total) by mouth daily. .  tadalafil (CIALIS) 20 MG tablet, TAKE 1 TABLET BY MOUTH EVERY DAY AS NEEDED  Current Outpatient Medications (Respiratory):  .  fexofenadine (ALLEGRA) 180 MG tablet, Take 1 tablet (180 mg total) by mouth daily. Marland Kitchen  triamcinolone (NASACORT) 55 MCG/ACT AERO nasal inhaler, Place 2 sprays into the nose daily.  Current Outpatient Medications (Analgesics):  .  aspirin EC 81 MG tablet, Take 1 tablet (81 mg total) by mouth daily. .  meloxicam (MOBIC) 7.5 MG tablet, Take 1 tablet (7.5 mg total) by mouth daily. .  traMADol (ULTRAM) 50 MG tablet, Take 1 tablet (50 mg total) by mouth every 6 (six) hours as needed.   Current Outpatient Medications (Other):  .  esomeprazole (NEXIUM) 40 MG capsule, Take 1 capsule (40 mg total) by mouth daily at 12 noon. .  meclizine (ANTIVERT) 12.5 MG tablet, Take 1 tablet (12.5 mg total) by mouth 3 (three) times daily as needed for dizziness. .  ondansetron (ZOFRAN) 4 MG tablet, Take 1 tablet (4 mg total) by mouth every 8 (eight) hours as needed for nausea or vomiting. Marland Kitchen  ALPRAZolam (XANAX) 0.5 MG tablet, Take 1-2 tablets (0.5-1 mg total) by mouth at bedtime as needed. Marland Kitchen  FLUoxetine (PROZAC) 40 MG capsule, Take 1 capsule (40 mg total) by mouth daily. .  Multiple Vitamin (MULTIVITAMIN WITH MINERALS) TABS tablet, Take 1 tablet by mouth daily. .  Omega-3 Fatty Acids (FISH OIL PO), Take 1  capsule by mouth daily. .  pantoprazole (PROTONIX) 40 MG tablet, Take 1 tablet (40 mg total) by mouth daily. .  tamsulosin (FLOMAX) 0.4 MG CAPS capsule, Take 1 capsule (0.4 mg total) by mouth daily. .  Vitamins/Minerals TABS, Take by mouth.

## 2020-07-28 NOTE — Assessment & Plan Note (Signed)
Chronic stable, declines med change or need for referral

## 2020-07-28 NOTE — Assessment & Plan Note (Signed)
Mild to mod, for restart allegra prn and nasacort asd,  to f/u any worsening symptoms or concerns

## 2020-07-28 NOTE — Assessment & Plan Note (Signed)
Mild to mod, for meclizine prn, to f/u any worsening symptoms or concerns 

## 2020-07-28 NOTE — Progress Notes (Signed)
Patient ID: Jacob [name], male   DOB: 01/21/1966, 55 y.o.   MRN: [id number]        Chief Complaint:: wellness exam and Dizziness  and numerous other issues including hypersomnolence, fatigue, insomnia, nausea, gerd       HPI:  Jacob [name] is a 55 y.o. male here for wellness exam; declines colonoscopy and eye exam referral but might be willing to do cologuard - will check with insurance, declines labs today                        Also c/o daytime somnolence and fatigue, not sure if snores at night but often wakes to non restorative sleep.  Also has difficulty getting to sleep most nights, wants to know about OTC option if any to try firtst.  Also has intermittent nause, and is not clear but may be related to occasional worsening intertment mild GERD, but Denies worsening abd pain, dysphagia, n/v, bowel change or blood.  Also incidentally in the past wk has had worsening intermittent mild to mod positional vertigo like dizziness and ear fullness worse since onset allergy symptoms in the past 2-3 wks.  Denies worsening depressive symptoms, suicidal ideation, or panic; has ongoing anxiety.  Pt denies chest pain, increased sob or doe, wheezing, orthopnea, PND, increased LE swelling, palpitations, dizziness or syncope.   Pt denies polydipsia, polyuria, or new worsening focal neuro s/s.     Wt Readings from Last 3 Encounters:  07/24/20 189 lb 9.6 oz (86 kg)  05/31/20 186 lb (84.4 kg)  10/27/19 187 lb (84.8 kg)   BP Readings from Last 3 Encounters:  07/24/20 118/70  05/31/20 140/82  10/27/19 110/72   Immunization History  Administered Date(s) Administered  . Influenza Inj Mdck Quad Pf 12/24/2018  . Influenza,inj,Quad PF,6+ Mos 01/11/2013, 11/19/2016, 11/26/2017, 01/22/2020  . Influenza-Unspecified 12/29/2015  . PFIZER(Purple Top)SARS-COV-2 Vaccination 03/09/2020  . Pneumococcal Polysaccharide-23 01/11/2013  . Tdap 06/21/2001, 09/22/2012   Health Maintenance Due  Topic Date Due  .  OPHTHALMOLOGY EXAM  06/29/2018      Past Medical History:  Diagnosis Date  . Allergic rhinitis   . Anxiety   . Arthritis   . Cholesteatoma of right ear   . Diabetes mellitus ([place])    bordreline  . Dysuria   . Enlarged prostate    pt unaware  . GERD (gastroesophageal reflux disease)   . Headache   . Hearing loss    Right ear  . Hernia of abdominal wall   . History of gross hematuria   . History of head injury   . History of kidney stones   . History of palpitations   . Hyperlipidemia 10/18/2014  . Hypertension   . Insomnia   . Lack of concentration   . Nasal fracture    Several  . Otitis media 11/10/2017   Bilateral  . Panic attacks   . Syncope   . Vertigo    Past Surgical History:  Procedure Laterality Date  . CHOLECYSTECTOMY    . CYSTOSCOPY N/A 05/10/2018   Procedure: CYSTOSCOPY WITH EXAM UNDER ANESTHESIA;  Surgeon: Ceasar Mons, MD;  Location: [facility];  Service: Urology;  Laterality: N/A;  ONLY NEEDS 30 MIN  . [place]  . INNER EAR SURGERY     right  . LUMBAR DISC SURGERY    . MASTOIDECTOMY Right   . NOSE SURGERY     Fracture  .  tubes in bil ears    . UPPER GI ENDOSCOPY  01/09/2012   Esophageal dilation    reports that he has never smoked. He has never used smokeless tobacco. He reports current alcohol use of about 5.0 standard drinks of alcohol per week. He reports that he does not use drugs. family history includes ADD / ADHD in his son; Anxiety disorder in his cousin, maternal aunt, maternal grandmother, maternal uncle, and mother; COPD in his mother; Cancer in his father; Diabetes in his maternal grandmother and paternal grandmother; Drug abuse in his son; Heart attack in his brother; Heart disease in his father; Hypertension in his mother; Lung cancer in his maternal uncle; OCD in his maternal aunt. No Known Allergies Current Outpatient Medications on File Prior to Visit  Medication Sig Dispense Refill   . ALPRAZolam (XANAX) 0.5 MG tablet Take 1-2 tablets (0.5-1 mg total) by mouth at bedtime as needed. 60 tablet 2  . aspirin EC 81 MG tablet Take 1 tablet (81 mg total) by mouth daily. 90 tablet 11  . fexofenadine (ALLEGRA) 180 MG tablet Take 1 tablet (180 mg total) by mouth daily. 90 tablet 3  . FLUoxetine (PROZAC) 40 MG capsule Take 1 capsule (40 mg total) by mouth daily. 90 capsule 3  . meloxicam (MOBIC) 7.5 MG tablet Take 1 tablet (7.5 mg total) by mouth daily. 30 tablet 5  . Multiple Vitamin (MULTIVITAMIN WITH MINERALS) TABS tablet Take 1 tablet by mouth daily.    . Omega-3 Fatty Acids (FISH OIL PO) Take 1 capsule by mouth daily.    . pantoprazole (PROTONIX) 40 MG tablet Take 1 tablet (40 mg total) by mouth daily. 90 tablet 3  . ramipril (ALTACE) 10 MG capsule Take 1 capsule (10 mg total) by mouth daily. Annual appt is due in must see provider for future refills 90 capsule 3  . rosuvastatin (CRESTOR) 10 MG tablet Take 1 tablet (10 mg total) by mouth daily. 90 tablet 3  . tadalafil (CIALIS) 20 MG tablet TAKE 1 TABLET BY MOUTH EVERY DAY AS NEEDED 8 tablet 7  . tamsulosin (FLOMAX) 0.4 MG CAPS capsule Take 1 capsule (0.4 mg total) by mouth daily. 90 capsule 3  . traMADol (ULTRAM) 50 MG tablet Take 1 tablet (50 mg total) by mouth every 6 (six) hours as needed. 30 tablet 2  . triamcinolone (NASACORT) 55 MCG/ACT AERO nasal inhaler Place 2 sprays into the nose daily. 3 Inhaler 3  . Vitamins/Minerals TABS Take by mouth.     No current facility-administered medications on file prior to visit.        ROS:  All others reviewed and negative.  Objective        PE:  BP 118/70 (BP Location: Left Arm, Patient Position: Sitting, Cuff Size: Large)   Pulse 75   Temp 98.2 F (36.8 C) (Oral)   Ht 6' (1.829 m)   Wt 189 lb 9.6 oz (86 kg)   SpO2 97%   BMI 25.71 kg/m                 Constitutional: Pt appears in NAD               HENT: Head: NCAT.                Right Ear: External ear normal.                  Left Ear: External ear normal. Bilat tm's with mild erythema.  Max  sinus areas non tender.  Pharynx with mild erythema, no exudate               Eyes: . Pupils are equal, round, and reactive to light. Conjunctivae and EOM are normal               Nose: without d/c or deformity               Neck: Neck supple. Gross normal ROM               Cardiovascular: Normal rate and regular rhythm.                 Pulmonary/Chest: Effort normal and breath sounds without rales or wheezing.                Abd:  Soft, NT, ND, + BS, no organomegaly               Neurological: Pt is alert. At baseline orientation, motor grossly intact, cn 2-12 intact               Skin: Skin is warm. No rashes, no other new lesions, LE edema - none               Psychiatric: Pt behavior is normal without agitation   Micro: none  Cardiac tracings I have personally interpreted today:  none  Pertinent Radiological findings (summarize): none   Lab Results  Component Value Date   WBC 4.8 03/09/2019   HGB 16.0 03/09/2019   HCT 47.9 03/09/2019   PLT 220 03/09/2019   GLUCOSE 134 (H) 06/17/2020   CHOL 147 06/17/2020   TRIG 189.0 (H) 06/17/2020   HDL 50.50 06/17/2020   LDLDIRECT 102.0 11/23/2016   LDLCALC 59 06/17/2020   ALT 25 06/17/2020   AST 16 06/17/2020   NA 141 06/17/2020   K 4.4 06/17/2020   CL 104 06/17/2020   CREATININE 1.25 06/17/2020   BUN 14 06/17/2020   CO2 29 06/17/2020   TSH 1.19 08/10/2018   PSA 1.07 08/10/2018   INR 0.96 12/11/2017   HGBA1C 7.3 (H) 06/17/2020   MICROALBUR <0.7 10/08/2017   Assessment/Plan:  Jacob [name] is a 55 y.o. White or Caucasian [1] male with  has a past medical history of Allergic rhinitis, Anxiety, Arthritis, Cholesteatoma of right ear, Diabetes mellitus ([place]), Dysuria, Enlarged prostate, GERD (gastroesophageal reflux disease), Headache, Hearing loss, Hernia of abdominal wall, History of gross hematuria, History of head injury, History of kidney stones, History of  palpitations, Hyperlipidemia (10/18/2014), Hypertension, Insomnia, Lack of concentration, Nasal fracture, Otitis media (11/10/2017), Panic attacks, Syncope, and Vertigo.  Encounter for well adult exam with abnormal findings Age and sex appropriate education and counseling updated with regular exercise and diet Referrals for preventative services - declines colonoscopy and eye exam, pt to f/u with  Insurance regarding coverage for cologuard Immunizations addressed - none needed Smoking counseling  - none needed Evidence for depression or other mood disorder - chronic anxiety persistent no change Most recent labs reviewed. I have personally reviewed and have noted: 1) the patient's medical and social history 2) The patient's current medications and supplements 3) The patient's height, weight, and BMI have been recorded in the chart   Vertigo Mild to mod, for meclizine prn, to f/u any worsening symptoms or concerns  Allergic rhinitis Mild to mod, for restart allegra prn and nasacort asd,  to f/u any worsening symptoms or concerns  Insomnia Mild to mod,  for melatonin otc prn  to f/u any worsening symptoms or concerns  Hypersomnolence Cant r/o osa - for pulm referral  Gastroesophageal reflux disease Mild worsening, d/w pt - we could increased protonix 40 bid, but he wants to try nexium 40 qd first  Diabetes mellitus without complication ([place]) Lab Results  Component Value Date   HGBA1C 7.3 (H) 06/17/2020   Mild over goal, for now pt to continue current medical treatment - metformin  Current Outpatient Medications (Endocrine & Metabolic):  .  metFORMIN (GLUCOPHAGE-XR) 500 MG 24 hr tablet, Take 4 tablets (2,000 mg total) by mouth daily with breakfast.  Current Outpatient Medications (Cardiovascular):  .  ramipril (ALTACE) 10 MG capsule, Take 1 capsule (10 mg total) by mouth daily. Annual appt is due in must see provider for future refills .  rosuvastatin (CRESTOR) 10 MG tablet, Take  1 tablet (10 mg total) by mouth daily. .  tadalafil (CIALIS) 20 MG tablet, TAKE 1 TABLET BY MOUTH EVERY DAY AS NEEDED  Current Outpatient Medications (Respiratory):  .  fexofenadine (ALLEGRA) 180 MG tablet, Take 1 tablet (180 mg total) by mouth daily. Marland Kitchen  triamcinolone (NASACORT) 55 MCG/ACT AERO nasal inhaler, Place 2 sprays into the nose daily.  Current Outpatient Medications (Analgesics):  .  aspirin EC 81 MG tablet, Take 1 tablet (81 mg total) by mouth daily. .  meloxicam (MOBIC) 7.5 MG tablet, Take 1 tablet (7.5 mg total) by mouth daily. .  traMADol (ULTRAM) 50 MG tablet, Take 1 tablet (50 mg total) by mouth every 6 (six) hours as needed.   Current Outpatient Medications (Other):  .  esomeprazole (NEXIUM) 40 MG capsule, Take 1 capsule (40 mg total) by mouth daily at 12 noon. .  meclizine (ANTIVERT) 12.5 MG tablet, Take 1 tablet (12.5 mg total) by mouth 3 (three) times daily as needed for dizziness. .  ondansetron (ZOFRAN) 4 MG tablet, Take 1 tablet (4 mg total) by mouth every 8 (eight) hours as needed for nausea or vomiting. Marland Kitchen  ALPRAZolam (XANAX) 0.5 MG tablet, Take 1-2 tablets (0.5-1 mg total) by mouth at bedtime as needed. Marland Kitchen  FLUoxetine (PROZAC) 40 MG capsule, Take 1 capsule (40 mg total) by mouth daily. .  Multiple Vitamin (MULTIVITAMIN WITH MINERALS) TABS tablet, Take 1 tablet by mouth daily. .  Omega-3 Fatty Acids (FISH OIL PO), Take 1 capsule by mouth daily. .  pantoprazole (PROTONIX) 40 MG tablet, Take 1 tablet (40 mg total) by mouth daily. .  tamsulosin (FLOMAX) 0.4 MG CAPS capsule, Take 1 capsule (0.4 mg total) by mouth daily. .  Vitamins/Minerals TABS, Take by mouth.   Depression with anxiety Chronic stable, declines med change or need for referral  Hyperlipidemia Lab Results  Component Value Date   LDLCALC 59 06/17/2020   Stable, pt to continue current statin crestor   Followup: Return in about 6 months (around 01/23/2021).  Cathlean Cower, MD 07/28/2020 5:16 PM [facility]

## 2020-07-28 NOTE — Assessment & Plan Note (Signed)
Lab Results  Component Value Date   LDLCALC 59 06/17/2020   Stable, pt to continue current statin crestor

## 2020-07-28 NOTE — Assessment & Plan Note (Signed)
Mild worsening, d/w pt - we could increased protonix 40 bid, but he wants to try nexium 40 qd first

## 2020-07-28 NOTE — Assessment & Plan Note (Signed)
Age and sex appropriate education and counseling updated with regular exercise and diet Referrals for preventative services - declines colonoscopy and eye exam, pt to f/u with  Insurance regarding coverage for cologuard Immunizations addressed - none needed Smoking counseling  - none needed Evidence for depression or other mood disorder - chronic anxiety persistent no change Most recent labs reviewed. I have personally reviewed and have noted: 1) the patient's medical and social history 2) The patient's current medications and supplements 3) The patient's height, weight, and BMI have been recorded in the chart

## 2020-08-05 ENCOUNTER — Telehealth: Payer: Self-pay | Admitting: Internal Medicine

## 2020-08-05 NOTE — Telephone Encounter (Signed)
   Patient requesting to pick up a copy of most recent lab results

## 2020-08-05 NOTE — Telephone Encounter (Signed)
Left message letting patient know that labs have been printed and will be waiting in the front.

## 2020-09-04 ENCOUNTER — Encounter: Payer: Self-pay | Admitting: Gastroenterology

## 2020-09-04 ENCOUNTER — Ambulatory Visit: Payer: BC Managed Care – PPO | Admitting: Gastroenterology

## 2020-09-04 VITALS — BP 110/66 | HR 75 | Ht 72.0 in | Wt 187.0 lb

## 2020-09-04 DIAGNOSIS — Z1212 Encounter for screening for malignant neoplasm of rectum: Secondary | ICD-10-CM | POA: Diagnosis not present

## 2020-09-04 DIAGNOSIS — Z1211 Encounter for screening for malignant neoplasm of colon: Secondary | ICD-10-CM | POA: Diagnosis not present

## 2020-09-04 DIAGNOSIS — R110 Nausea: Secondary | ICD-10-CM | POA: Diagnosis not present

## 2020-09-04 DIAGNOSIS — K219 Gastro-esophageal reflux disease without esophagitis: Secondary | ICD-10-CM | POA: Diagnosis not present

## 2020-09-04 MED ORDER — PANTOPRAZOLE SODIUM 40 MG PO TBEC: 40.0000 mg | DELAYED_RELEASE_TABLET | Freq: Two times a day (BID) | ORAL | 5 refills | Status: DC

## 2020-09-04 MED ORDER — PLENVU 140 G PO SOLR: ORAL | 0 refills | Status: DC

## 2020-09-04 NOTE — Patient Instructions (Signed)
If you are age 54 or older, your body mass index should be between 23-30. Your Body mass index is 25.36 kg/m. If this is out of the aforementioned range listed, please consider follow up with your Primary Care Provider.  If you are age 48 or younger, your body mass index should be between 19-25. Your Body mass index is 25.36 kg/m. If this is out of the aformentioned range listed, please consider follow up with your Primary Care Provider.  You have been scheduled for an endoscopy and colonoscopy. Please follow the written instructions given to you at your visit today. Please pick up your prep supplies at the pharmacy within the next 1-3 days. If you use inhalers (even only as needed), please bring them with you on the day of your procedure.  We have sent the following medications to your pharmacy for you to pick up at your convenience: Pantoprazole 40 mg.    The [place] [facility] providers would like to encourage you to use [facility] to communicate with providers for non-urgent requests or questions.  Due to long hold times on the telephone, sending your provider a message by [facility] may be a faster and more efficient way to get a response.  Please allow 48 business hours for a response.  Please remember that this is for non-urgent requests.   It was a pleasure to see you today!  Thank you for trusting me with your gastrointestinal care!    Alonza Bogus, PA-C

## 2020-09-04 NOTE — Progress Notes (Signed)
09/04/2020 Jacob [name] [id number] 04/30/1965   HISTORY OF PRESENT ILLNESS: This is a pleasant 55 year old male who is a patient of Dr. Vena Rua.  He had an EGD in 2013 at which time he was only found to have gastritis.  Biopsies showed normal benign gastric mucosa, negative for H. pylori.  He empirically dilated his esophagus due to complaints of dysphagia.  He was then seen by me in 2019 for complaints of nausea and dysphagia.  Upper GI series showed spontaneous GERD, but was otherwise unremarkable.  He is here again today with complaints of nausea.  He tells me that it seems to happen randomly.  He says that some days it will not happen at all and sometimes it will happen 2 or 3 times a day.  He says that he thinks sometimes it occurs right before he eats like when he is hungry.  Never vomits.  He remains on his pantoprazole 40 mg only once daily.  His weight has been stable.  He denies abdominal pain.  He does take 1-2 aspirin 81 mg daily.  He uses occasional meloxicam for arthritic pains.  He has never had colonoscopy in the past.  We will schedule that when I saw him back in 2019, but he never proceeded   Past Medical History:  Diagnosis Date  . Allergic rhinitis   . Anxiety   . Arthritis   . Cholesteatoma of right ear   . 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 Dysuria   . Enlarged prostate    pt unaware  . GERD (gastroesophageal reflux disease)   . Headache   . Hearing loss    Right ear  . Hernia of abdominal wall   . History of gross hematuria   . History of head injury   . History of kidney stones   . History of palpitations   . Hyperlipidemia 10/18/2014  . Hypertension   . Insomnia   . Lack of concentration   . Nasal fracture    Several  . Otitis media 11/10/2017   Bilateral  . Panic attacks   . Syncope   . Vertigo    Past Surgical History:  Procedure Laterality Date  . CHOLECYSTECTOMY    . CYSTOSCOPY N/A 05/10/2018   Procedure: CYSTOSCOPY WITH EXAM  UNDER ANESTHESIA;  Surgeon: Ceasar Mons, MD;  [facility];  Service: Urology;  Laterality: N/A;  ONLY NEEDS 30 MIN  . [place]  . INNER EAR SURGERY     right  . LUMBAR DISC SURGERY    . MASTOIDECTOMY Right   . NOSE SURGERY     Fracture  . tubes in bil ears    . UPPER GI ENDOSCOPY  01/09/2012   Esophageal dilation    reports that he has never smoked. He has never used smokeless tobacco. He reports current alcohol use of about 5.0 standard drinks of alcohol per week. He reports that he does not use drugs. family history includes ADD / ADHD in his son; Anxiety disorder in his cousin, maternal aunt, maternal grandmother, maternal uncle, and mother; COPD in his mother; Cancer in his father; Diabetes in his maternal grandmother and paternal grandmother; Drug abuse in his son; Heart attack in his brother; Heart disease in his father; Hypertension in his mother; Lung cancer in his maternal uncle; OCD in his maternal aunt. No Known Allergies    Outpatient Encounter Medications as of 09/04/2020  Medication Sig  .  ALPRAZolam (XANAX) 0.5 MG tablet Take 1-2 tablets (0.5-1 mg total) by mouth at bedtime as needed.  Marland Kitchen aspirin EC 81 MG tablet Take 1 tablet (81 mg total) by mouth daily.  . metFORMIN (GLUCOPHAGE-XR) 500 MG 24 hr tablet Take 4 tablets (2,000 mg total) by mouth daily with breakfast.  . Multiple Vitamin (MULTIVITAMIN WITH MINERALS) TABS tablet Take 1 tablet by mouth daily.  . Omega-3 Fatty Acids (FISH OIL PO) Take 1 capsule by mouth daily.  . pantoprazole (PROTONIX) 40 MG tablet Take 1 tablet (40 mg total) by mouth daily.  . ramipril (ALTACE) 10 MG capsule Take 1 capsule (10 mg total) by mouth daily. Annual appt is due in must see provider for future refills  . rosuvastatin (CRESTOR) 10 MG tablet Take 1 tablet (10 mg total) by mouth daily.  . tadalafil (CIALIS) 20 MG tablet TAKE 1 TABLET BY MOUTH EVERY DAY AS NEEDED  . traMADol  (ULTRAM) 50 MG tablet Take 1 tablet (50 mg total) by mouth every 6 (six) hours as needed.  . triamcinolone (NASACORT) 55 MCG/ACT AERO nasal inhaler Place 2 sprays into the nose daily.  . Vitamins/Minerals TABS Take by mouth.  . meloxicam (MOBIC) 7.5 MG tablet Take 1 tablet (7.5 mg total) by mouth daily. (Patient not taking: Reported on 09/04/2020)  . [DISCONTINUED] esomeprazole (NEXIUM) 40 MG capsule Take 1 capsule (40 mg total) by mouth daily at 12 noon. (Patient not taking: Reported on 09/04/2020)  . [DISCONTINUED] fexofenadine (ALLEGRA) 180 MG tablet Take 1 tablet (180 mg total) by mouth daily.  . [DISCONTINUED] FLUoxetine (PROZAC) 40 MG capsule Take 1 capsule (40 mg total) by mouth daily.  . [DISCONTINUED] meclizine (ANTIVERT) 12.5 MG tablet Take 1 tablet (12.5 mg total) by mouth 3 (three) times daily as needed for dizziness. (Patient not taking: Reported on 09/04/2020)  . [DISCONTINUED] ondansetron (ZOFRAN) 4 MG tablet Take 1 tablet (4 mg total) by mouth every 8 (eight) hours as needed for nausea or vomiting. (Patient not taking: Reported on 09/04/2020)  . [DISCONTINUED] tamsulosin (FLOMAX) 0.4 MG CAPS capsule Take 1 capsule (0.4 mg total) by mouth daily. (Patient not taking: Reported on 09/04/2020)   No facility-administered encounter medications on file as of 09/04/2020.     REVIEW OF SYSTEMS  : All other systems reviewed and negative except where noted in the History of Present Illness.   PHYSICAL EXAM: BP 110/66   Pulse 75   Ht 6' (1.829 m)   Wt 187 lb (84.8 kg)   SpO2 96%   BMI 25.36 kg/m  General: Well developed white male in no acute distress Head: Normocephalic and atraumatic Eyes:  Sclerae anicteric, conjunctiva pink. Ears: Normal auditory acuity Lungs: Clear throughout to auscultation; no W/R/R. Heart: Regular rate and rhythm; no M/R/G Abdomen: Soft, non-distended.  BS present.  Non-tender. Rectal:  Will be done at the time of colonoscopy. Musculoskeletal: Symmetrical with no  gross deformities  Skin: No lesions on visible extremities Extremities: No edema  Neurological: Alert oriented x 4, grossly non-focal Psychological:  Alert and cooperative. Normal mood and affect  ASSESSMENT AND PLAN: *Nausea:  This has been an intermittent issue for years.  ? Due to GERD.  He still had spontaneous GERD noted on UGI previously on once daily PPI.  I am going to increase his pantoprazole to twice daily in case this is all acid related.  New prescription sent to pharmacy.  We will proceed with repeat EGD as well.  Schedule with Dr. Hilarie Fredrickson. *CRC screening:  Never had colonoscopy in the past.  Will schedule with Dr. Hilarie Fredrickson.  **The risks, benefits, and alternatives to EGD and colonoscopy were discussed with the patient and he consents to proceed.    CC:  Biagio Borg, MD

## 2020-09-05 NOTE — Progress Notes (Signed)
Addendum: Reviewed and agree with assessment and management plan. Winston Sobczyk M, MD  

## 2020-10-22 ENCOUNTER — Other Ambulatory Visit: Payer: Self-pay

## 2020-10-22 ENCOUNTER — Ambulatory Visit: Payer: BC Managed Care – PPO | Admitting: Internal Medicine

## 2020-10-22 VITALS — BP 110/68 | HR 62 | Temp 97.9°F | Ht 72.0 in | Wt 189.0 lb

## 2020-10-22 DIAGNOSIS — H6983 Other specified disorders of Eustachian tube, bilateral: Secondary | ICD-10-CM

## 2020-10-22 DIAGNOSIS — G8929 Other chronic pain: Secondary | ICD-10-CM

## 2020-10-22 DIAGNOSIS — M5441 Lumbago with sciatica, right side: Secondary | ICD-10-CM

## 2020-10-22 DIAGNOSIS — I10 Essential (primary) hypertension: Secondary | ICD-10-CM

## 2020-10-22 DIAGNOSIS — E119 Type 2 diabetes mellitus without complications: Secondary | ICD-10-CM

## 2020-10-22 DIAGNOSIS — H9193 Unspecified hearing loss, bilateral: Secondary | ICD-10-CM

## 2020-10-22 DIAGNOSIS — M5442 Lumbago with sciatica, left side: Secondary | ICD-10-CM | POA: Diagnosis not present

## 2020-10-22 DIAGNOSIS — J309 Allergic rhinitis, unspecified: Secondary | ICD-10-CM

## 2020-10-22 LAB — POCT GLYCOSYLATED HEMOGLOBIN (HGB A1C): Hemoglobin A1C: 7.3 % — AB (ref 4.0–5.6)

## 2020-10-22 NOTE — Patient Instructions (Addendum)
Your A1c was Ok today  Please continue all other medications as before, including the nasacort for the allergies  You can also take Mucinex (or it's generic off brand) for ear congestion, and tylenol as needed for pain.  Please have the pharmacy call with any other refills you may need.  Please continue your efforts at being more active, low cholesterol diabetic diet, and weight control.  Please keep your appointments with your specialists as you may have planned - sports medicine for right shoulder and knee, and the EGD/colonoscopy per GI next month  You will be contacted regarding the referral for: Audiology (for hearing testing)

## 2020-10-22 NOTE — Progress Notes (Signed)
Chief Complaint: follow up HTN, HLD and hyperglycemia, bilateral hearing loss, allergies, chronic LCP and left sciatica, right shoudler bursitis, right knee djd       HPI:  Jacob [name] is a 55 y.o. male here overall doing ok but CBGs have been mid 100s and asking for A1c;  Pt denies polydipsia, polyuria, or new focal neuro s/s.  Pt denies chest pain, increased sob or doe, wheezing, orthopnea, PND, increased LE swelling, palpitations, dizziness or syncope.   Pt denies fever, wt loss, night sweats, loss of appetite, or other constitutional symptoms  Does have 1 mo worsening right shoudler bursitis and rihgt knee djd pain and soreness and conts to work hard at his physical job.  Pt continues to have recurring LBP with intermittent left leg pain and tingling , but no bowel or bladder change, fever, wt loss,  worsening LE weakness, gait change or falls.  Also has noticeable worse bialteral hearing loss, wondering about wax but may need hearing referral and aids.  Does have several wks ongoing nasal allergy symptoms with clearish congestion, itch and sneezing, without fever, pain, ST, cough, swelling or wheezing.   Pt denies fever, wt loss, night sweats, loss of appetite, or other constitutional symptoms  Wt Readings from Last 3 Encounters:  10/22/20 189 lb (85.7 kg)  09/04/20 187 lb (84.8 kg)  07/24/20 189 lb 9.6 oz (86 kg)   BP Readings from Last 3 Encounters:  10/22/20 110/68  09/04/20 110/66  07/24/20 118/70         Past Medical History:  Diagnosis Date   Allergic rhinitis    Anxiety    Arthritis    Cholesteatoma of right ear    Diabetes mellitus ([place])    bordreline   Dysuria    Enlarged prostate    pt unaware   GERD (gastroesophageal reflux disease)    Headache    Hearing loss    Right ear   Hernia of abdominal wall    History of gross hematuria    History of head injury    History of kidney stones    History of palpitations    Hyperlipidemia 10/18/2014   Hypertension     Insomnia    Lack of concentration    Nasal fracture    Several   Otitis media 11/10/2017   Bilateral   Panic attacks    Syncope    Vertigo    Past Surgical History:  Procedure Laterality Date   CHOLECYSTECTOMY     CYSTOSCOPY N/A 05/10/2018   Procedure: CYSTOSCOPY WITH EXAM UNDER ANESTHESIA;  Surgeon: Ceasar Mons, MD;  Location[facility];  Service: Urology;  Laterality: N/A;  ONLY NEEDS 30 MIN   HYPOSPADIAS CORRECTION N/A 1978   INNER EAR SURGERY     right   LUMBAR DISC SURGERY     MASTOIDECTOMY Right    NOSE SURGERY     Fracture   tubes in bil ears     UPPER GI ENDOSCOPY  01/09/2012   Esophageal dilation    reports that he has never smoked. He has never used smokeless tobacco. He reports current alcohol use of about 5.0 standard drinks of alcohol per week. He reports that he does not use drugs. family history includes ADD / ADHD in his son; Anxiety disorder in his cousin, maternal aunt, maternal grandmother, maternal uncle, and mother; COPD in his mother; Cancer in his father; Diabetes in his maternal grandmother and paternal  grandmother; Drug abuse in his son; Heart attack in his brother; Heart disease in his father; Hypertension in his mother; Lung cancer in his maternal uncle; OCD in his maternal aunt. No Known Allergies Current Outpatient Medications on File Prior to Visit  Medication Sig Dispense Refill   ALPRAZolam (XANAX) 0.5 MG tablet Take 1-2 tablets (0.5-1 mg total) by mouth at bedtime as needed. 60 tablet 2   aspirin EC 81 MG tablet Take 1 tablet (81 mg total) by mouth daily. 90 tablet 11   metFORMIN (GLUCOPHAGE-XR) 500 MG 24 hr tablet Take 4 tablets (2,000 mg total) by mouth daily with breakfast. 360 tablet 3   Multiple Vitamin (MULTIVITAMIN WITH MINERALS) TABS tablet Take 1 tablet by mouth daily.     Omega-3 Fatty Acids (FISH OIL PO) Take 1 capsule by mouth daily.     pantoprazole (PROTONIX) 40 MG tablet Take 1 tablet (40 mg total) by  mouth 2 (two) times daily. 60 tablet 5   ramipril (ALTACE) 10 MG capsule Take 1 capsule (10 mg total) by mouth daily. Annual appt is due in must see provider for future refills 90 capsule 3   rosuvastatin (CRESTOR) 10 MG tablet Take 1 tablet (10 mg total) by mouth daily. 90 tablet 3   tadalafil (CIALIS) 20 MG tablet TAKE 1 TABLET BY MOUTH EVERY DAY AS NEEDED 8 tablet 7   traMADol (ULTRAM) 50 MG tablet Take 1 tablet (50 mg total) by mouth every 6 (six) hours as needed. 30 tablet 2   triamcinolone (NASACORT) 55 MCG/ACT AERO nasal inhaler Place 2 sprays into the nose daily. 3 Inhaler 3   Vitamins/Minerals TABS Take by mouth.     meloxicam (MOBIC) 7.5 MG tablet Take 1 tablet (7.5 mg total) by mouth daily. (Patient not taking: No sig reported) 30 tablet 5   PEG-KCl-NaCl-NaSulf-Na Asc-C (PLENVU) 140 g SOLR Use as directed. Manufacturer's coupon Universal coupon code:BIN: C1589615; GROUPSE:[id number]; PCN: [id number]; IDCY:[id number]; PAY NO MORE $50; NO prior authorization (Patient not taking: Reported on 10/22/2020) 1 each 0   No current facility-administered medications on file prior to visit.        ROS:  All others reviewed and negative.  Objective        PE:  BP 110/68   Pulse 62   Temp 97.9 F (36.6 C) (Oral)   Ht 6' (1.829 m)   Wt 189 lb (85.7 kg)   SpO2 97%   BMI 25.63 kg/m                 Constitutional: Pt appears in NAD               HENT: Head: NCAT.                Right Ear: External ear normal.                 Left Ear: External ear normal.  No bilateral wax impactions               Bilat tm's with mild erythema.  Max sinus areas non tender.  Pharynx with mild erythema, no exudate               Eyes: . Pupils are equal, round, and reactive to light. Conjunctivae and EOM are normal               Nose: without d/c or deformity  Neck: Neck supple. Gross normal ROM               Cardiovascular: Normal rate and regular rhythm.                 Pulmonary/Chest: Effort normal  and breath sounds without rales or wheezing.                Abd:  Soft, NT, ND, + BS, no organomegaly; but has tender right subacromial and right knee crepitus with trace effusion                Spine nontender in midline or paravertebral               Neurological: Pt is alert. At baseline orientation, motor grossly intact               Skin: Skin is warm. No rashes, no other new lesions, LE edema - none               Psychiatric: Pt behavior is normal without agitation   Micro: none  Cardiac tracings I have personally interpreted today:  none  Pertinent Radiological findings (summarize): none   Lab Results  Component Value Date   WBC 4.8 03/09/2019   HGB 16.0 03/09/2019   HCT 47.9 03/09/2019   PLT 220 03/09/2019   GLUCOSE 134 (H) 06/17/2020   CHOL 147 06/17/2020   TRIG 189.0 (H) 06/17/2020   HDL 50.50 06/17/2020   LDLDIRECT 102.0 11/23/2016   LDLCALC 59 06/17/2020   ALT 25 06/17/2020   AST 16 06/17/2020   NA 141 06/17/2020   K 4.4 06/17/2020   CL 104 06/17/2020   CREATININE 1.25 06/17/2020   BUN 14 06/17/2020   CO2 29 06/17/2020   TSH 1.19 08/10/2018   PSA 1.07 08/10/2018   INR 0.96 12/11/2017   HGBA1C 7.3 (A) 10/22/2020   MICROALBUR <0.7 10/08/2017   POCT glycosylated hemoglobin (Hb A1C) Order: [id number] Status: Final result   Visible to patient: Yes (not seen)   Dx: Diabetes mellitus without complicatio...   0 Result Notes   1 HM Topic  Component Ref Range & Units 11:10  (10/22/20) 4 mo ago  (06/17/20) 1 yr ago  (08/21/19) 1 yr ago  (05/12/19) 2 yr ago  (08/10/18) 3 yr ago  (10/08/17) 3 yr ago  (06/09/17)  Hemoglobin A1C 4.0 - 5.6 % 7.3 Abnormal   7.3 High  R, CM  7.3 Abnormal   6.9 High  R, CM  6.8 High  R, CM  6.4 R, CM  6.3 R       Assessment/Plan:  Jacob [name] is a 55 y.o. White or Caucasian [1] male with  has a past medical history of Allergic rhinitis, Anxiety, Arthritis, Cholesteatoma of right ear, Diabetes mellitus ([place]), Dysuria, Enlarged prostate,  GERD (gastroesophageal reflux disease), Headache, Hearing loss, Hernia of abdominal wall, History of gross hematuria, History of head injury, History of kidney stones, History of palpitations, Hyperlipidemia (10/18/2014), Hypertension, Insomnia, Lack of concentration, Nasal fracture, Otitis media (11/10/2017), Panic attacks, Syncope, and Vertigo.  Chronic low back pain with bilateral sciatica Chronic persistent, maybe aggrevated by work, no worsening neuro s/s or falls, will hold imaging, for tramaodl prn, and f/u sport med  Essential hypertension, benign BP Readings from Last 3 Encounters:  10/22/20 110/68  09/04/20 110/66  07/24/20 118/70   Stable, pt to continue medical treatment altace   Diabetes mellitus without complication [facility]) Lab Results  Component  Value Date   HGBA1C 7.3 (A) 10/22/2020   Mild uncontrolled, pt to continue current medical treatment metformin, as declines further change today   Allergic rhinitis Mild to mod flare of chronic,  for restart nasacort asd,,  to f/u any worsening symptoms or concerns  Hearing loss No evidence for wax impaction, ok for audiology referral, may need haring aids  Followup: No follow-ups on file.  Cathlean Cower, MD 10/24/2020 6:39 AM [facility]

## 2020-10-23 ENCOUNTER — Encounter: Payer: Self-pay | Admitting: Internal Medicine

## 2020-10-24 ENCOUNTER — Encounter: Payer: Self-pay | Admitting: Internal Medicine

## 2020-10-24 NOTE — Assessment & Plan Note (Signed)
Lab Results  Component Value Date   HGBA1C 7.3 (A) 10/22/2020   Mild uncontrolled, pt to continue current medical treatment metformin, as declines further change today

## 2020-10-24 NOTE — Assessment & Plan Note (Signed)
Chronic persistent, maybe aggrevated by work, no worsening neuro s/s or falls, will hold imaging, for tramaodl prn, and f/u sport med

## 2020-10-24 NOTE — Assessment & Plan Note (Signed)
Mild to mod flare of chronic,  for restart nasacort asd,,  to f/u any worsening symptoms or concerns

## 2020-10-24 NOTE — Assessment & Plan Note (Signed)
BP Readings from Last 3 Encounters:  10/22/20 110/68  09/04/20 110/66  07/24/20 118/70   Stable, pt to continue medical treatment altace

## 2020-10-24 NOTE — Assessment & Plan Note (Signed)
No evidence for wax impaction, ok for audiology referral, may need haring aids

## 2020-11-18 ENCOUNTER — Ambulatory Visit: Payer: BC Managed Care – PPO | Admitting: Audiologist

## 2020-11-25 ENCOUNTER — Encounter: Payer: BC Managed Care – PPO | Admitting: Internal Medicine

## 2020-11-25 DIAGNOSIS — R0789 Other chest pain: Secondary | ICD-10-CM | POA: Diagnosis not present

## 2020-11-25 DIAGNOSIS — K219 Gastro-esophageal reflux disease without esophagitis: Secondary | ICD-10-CM | POA: Diagnosis not present

## 2020-11-25 DIAGNOSIS — R001 Bradycardia, unspecified: Secondary | ICD-10-CM | POA: Diagnosis not present

## 2020-11-25 DIAGNOSIS — E119 Type 2 diabetes mellitus without complications: Secondary | ICD-10-CM | POA: Diagnosis not present

## 2020-11-25 DIAGNOSIS — R079 Chest pain, unspecified: Secondary | ICD-10-CM | POA: Diagnosis not present

## 2020-11-25 DIAGNOSIS — R0602 Shortness of breath: Secondary | ICD-10-CM | POA: Diagnosis not present

## 2020-11-26 ENCOUNTER — Ambulatory Visit: Payer: BC Managed Care – PPO | Admitting: Internal Medicine

## 2020-11-26 ENCOUNTER — Encounter: Payer: Self-pay | Admitting: Internal Medicine

## 2020-11-26 ENCOUNTER — Other Ambulatory Visit: Payer: Self-pay

## 2020-11-26 VITALS — BP 140/44 | HR 80 | Temp 99.0°F | Ht 72.0 in | Wt 186.0 lb

## 2020-11-26 DIAGNOSIS — R079 Chest pain, unspecified: Secondary | ICD-10-CM | POA: Diagnosis not present

## 2020-11-26 NOTE — Progress Notes (Signed)
Subjective:    Patient ID: Jacob [name], male    DOB: [birth year], 55 y.o.   MRN: [id number]  HPI The patient is here for follow up from the emergency room.   He went to [facility] emergency room yesterday for chest pain.  It started 1 hour prior to arrival while he was at work.  he did obtain relief with four 81 mg aspirins.  Chest pain was in the middle of his chest without radiation.  He did have some shortness of breath.  He does have some anxiety and wondered if that made it worse.  Vital signs were stable.  Blood pressure was 143/72.  He did report some heartburn symptoms.  He reported intermittent epigastric pain that is mild.  He had no other complaints.  Troponin x2 negative, D-dimer negative chest x-ray shows no acute abnormality.  EKG showed sinus bradycardia 59 bpm, otherwise normal.  He was discharged home-atypical chest pain, chronic GERD that seems to be controlled with pantoprazole.  He is here today for follow-up and requesting a note for work so that he can return to work.  He had a normal nuclear stress test 06/2016.  He had a negative treadmill test last year, which is not available for review.    No chest pain since then.  His gerd is typically controlled, but he has frequent nausea and reflux.  He is scheduled for a EGD, colonoscopy soon.     Medications and allergies reviewed with patient and updated if appropriate.  Patient Active Problem List   Diagnosis Date Noted   Insomnia 07/24/2020   Grief 06/02/2020   Right rotator cuff tear 10/27/2019   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   Sleep disorder 08/23/2018   Chronic dysfunction of both eustachian tubes 08/15/2018   Memory loss 08/11/2018   Dysuria 04/22/2018   Gross hematuria 04/22/2018   STD exposure 04/22/2018   Lack of concentration 12/21/2017   Panic attacks 12/21/2017   Obsessional thoughts 12/21/2017   Acute sinus infection 06/11/2017   Screening for colorectal cancer 04/09/2017    Nausea without vomiting 04/09/2017   Dysphagia 04/09/2017   Dyspepsia 11/19/2016   Right otitis media 09/09/2016   Vertigo 09/09/2016   Hearing loss, right 09/09/2016   BPH (benign prostatic hyperplasia) 05/30/2016   Right shoulder pain 05/30/2016   Acute midline low back pain with sciatica 04/21/2016   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 04/21/2016   Urinary frequency 02/05/2016   Syncope 09/27/2015   Left lateral epicondylitis 08/31/2015   Allergic rhinitis 08/29/2015   Hearing loss 06/05/2015   Hypersomnolence 06/05/2015   Chest pain 01/23/2015   Chronic pain syndrome 01/23/2015   Hyperlipidemia 10/18/2014   Impingement syndrome of right shoulder 06/28/2014   Hip flexor tendon tightness 06/28/2014   Right knee pain 06/28/2014   Depression with anxiety 01/30/2013   Diabetes mellitus without complication ([place]) 0000000   Encounter for well adult exam with abnormal findings 01/11/2013   Essential hypertension, benign 01/11/2013   Low back pain syndrome 01/11/2013   DJD (degenerative joint disease) 01/11/2013   Erectile dysfunction 01/11/2013   Gastroesophageal reflux disease 12/30/2011    Current Outpatient Medications on File Prior to Visit  Medication Sig Dispense Refill   ALPRAZolam (XANAX) 0.5 MG tablet Take 1-2 tablets (0.5-1 mg total) by mouth at bedtime as needed. 60 tablet 2   aspirin EC 81 MG tablet Take 1 tablet (81 mg total) by mouth daily. 90 tablet  11   meloxicam (MOBIC) 7.5 MG tablet Take 1 tablet (7.5 mg total) by mouth daily. 30 tablet 5   metFORMIN (GLUCOPHAGE-XR) 500 MG 24 hr tablet Take 4 tablets (2,000 mg total) by mouth daily with breakfast. 360 tablet 3   Multiple Vitamin (MULTIVITAMIN WITH MINERALS) TABS tablet Take 1 tablet by mouth daily.     Omega-3 Fatty Acids (FISH OIL PO) Take 1 capsule by mouth daily.     pantoprazole (PROTONIX) 40 MG tablet Take 1 tablet (40 mg total) by mouth 2 (two) times daily. 60 tablet 5    PEG-KCl-NaCl-NaSulf-Na Asc-C (PLENVU) 140 g SOLR Use as directed. Manufacturer's coupon Universal coupon code:BIN: C1589615; GROUPSE:2314430; PCN: [id number]; IDCY:[id number]; PAY NO MORE $50; NO prior authorization 1 each 0   ramipril (ALTACE) 10 MG capsule Take 1 capsule (10 mg total) by mouth daily. Annual appt is due in must see provider for future refills 90 capsule 3   rosuvastatin (CRESTOR) 10 MG tablet Take 1 tablet (10 mg total) by mouth daily. 90 tablet 3   tadalafil (CIALIS) 20 MG tablet TAKE 1 TABLET BY MOUTH EVERY DAY AS NEEDED 8 tablet 7   traMADol (ULTRAM) 50 MG tablet Take 1 tablet (50 mg total) by mouth every 6 (six) hours as needed. 30 tablet 2   triamcinolone (NASACORT) 55 MCG/ACT AERO nasal inhaler Place 2 sprays into the nose daily. 3 Inhaler 3   Vitamins/Minerals TABS Take by mouth.     No current facility-administered medications on file prior to visit.    Past Medical History:  Diagnosis Date   Allergic rhinitis    Anxiety    Arthritis    Cholesteatoma of right ear    Diabetes mellitus ([place])    bordreline   Dysuria    Enlarged prostate    pt unaware   GERD (gastroesophageal reflux disease)    Headache    Hearing loss    Right ear   Hernia of abdominal wall    History of gross hematuria    History of head injury    History of kidney stones    History of palpitations    Hyperlipidemia 10/18/2014   Hypertension    Insomnia    Lack of concentration    Nasal fracture    Several   Otitis media 11/10/2017   Bilateral   Panic attacks    Syncope    Vertigo     Past Surgical History:  Procedure Laterality Date   CHOLECYSTECTOMY     CYSTOSCOPY N/A 05/10/2018   Procedure: CYSTOSCOPY WITH EXAM UNDER ANESTHESIA;  Surgeon: Ceasar Mons, MD;  [facility]  Service: Urology;  Laterality: N/A;  ONLY NEEDS 30 MIN   HYPOSPADIAS CORRECTION N/A 1978   INNER EAR SURGERY     right   LUMBAR DISC SURGERY     MASTOIDECTOMY Right    NOSE  SURGERY     Fracture   tubes in bil ears     UPPER GI ENDOSCOPY  01/09/2012   Esophageal dilation    Social History   Socioeconomic History   Marital status: Single    Spouse name: Not on file   Number of children: 3   Years of education: Not on file   Highest education level: Not on file  Occupational History   Occupation: Music therapist: [employer]  Tobacco Use   Smoking status: Never   Smokeless tobacco: Never  Vaping Use   Vaping Use:  Never used  Substance and Sexual Activity   Alcohol use: Yes    Alcohol/week: 5.0 standard drinks    Types: 5 Glasses of wine per week    Comment: weekends   Drug use: No   Sexual activity: Yes    Partners: Female    Birth control/protection: None  Other Topics Concern   Not on file  Social History Narrative   Not on file   Social Determinants of Health   Financial Resource Strain: Not on file  Food Insecurity: Not on file  Transportation Needs: Not on file  Physical Activity: Not on file  Stress: Not on file  Social Connections: Not on file    Family History  Problem Relation Age of Onset   COPD Mother    Hypertension Mother    Anxiety disorder Mother    Heart disease Father    Cancer Father    Diabetes Paternal Grandmother    Diabetes Maternal Grandmother    Anxiety disorder Maternal Grandmother    Heart attack Brother    Lung cancer Maternal Uncle    Anxiety disorder Maternal Uncle    Anxiety disorder Maternal Aunt    OCD Maternal Aunt    Anxiety disorder Cousin    ADD / ADHD Son    Drug abuse Son    Colon cancer Neg Hx    Esophageal cancer Neg Hx    Rectal cancer Neg Hx    Stomach cancer Neg Hx     Review of Systems  Constitutional:  Negative for fever.  Respiratory:  Negative for cough, shortness of breath and wheezing.   Cardiovascular:  Negative for chest pain, palpitations and leg swelling.  Gastrointestinal:  Positive for nausea. Negative for abdominal pain.       Frequent GERD   Neurological:  Negative for light-headedness and headaches.      Objective:   Vitals:   11/26/20 1308  BP: (!) 140/44  Pulse: 80  Temp: 99 F (37.2 C)  SpO2: 99%   BP Readings from Last 3 Encounters:  11/26/20 (!) 140/44  10/22/20 110/68  09/04/20 110/66   Wt Readings from Last 3 Encounters:  11/26/20 186 lb (84.4 kg)  10/22/20 189 lb (85.7 kg)  09/04/20 187 lb (84.8 kg)   Body mass index is 25.23 kg/m.   Physical Exam    Constitutional: Appears well-developed and well-nourished. No distress.  HENT:  Head: Normocephalic and atraumatic.  Neck: Neck supple. No tracheal deviation present. No thyromegaly present.  No cervical lymphadenopathy Cardiovascular: Normal rate, regular rhythm and normal heart sounds.   No murmur heard. No carotid bruit .  No edema Pulmonary/Chest: Effort normal and breath sounds normal. No respiratory distress. No has no wheezes. No rales.  Skin: Skin is warm and dry. Not diaphoretic.  Psychiatric: Normal mood and affect. Behavior is normal.    Blood work from [facility] emergency room reviewed in care everywhere  Assessment & Plan:    Chest pain: Yesterday had an episode of chest pain while at work associated with shortness of breath ?  Cardiac, anxiety, GERD Four 81 mg aspirins did seem to help Went to the emergency room at [facility] and work-up negative for ACS No chest pain then GERD sounds like its not ideally controlled has a EGD scheduled Does have anxiety and he states it could have been anxiety as well He had a normal stress test in 2018 and a negative treadmill stress test per patient last year Given the above negative stress  test and negative work-up in the emergency room we will hold off on having him see cardiology at this time If he has another episode he may need to revisit cardiology given his risk factors-he agrees with plan  Note given to return to work immediately without restrictions   This visit occurred during the  SARS-CoV-2 public health emergency.  Safety protocols were in place, including screening questions prior to the visit, additional usage of staff PPE, and extensive cleaning of exam room while observing appropriate contact time as indicated for disinfecting solutions.

## 2020-11-26 NOTE — Patient Instructions (Addendum)
     Note given for work.

## 2020-12-16 ENCOUNTER — Ambulatory Visit (INDEPENDENT_AMBULATORY_CARE_PROVIDER_SITE_OTHER): Payer: BC Managed Care – PPO | Admitting: Psychiatry

## 2020-12-16 ENCOUNTER — Encounter: Payer: Self-pay | Admitting: Psychiatry

## 2020-12-16 ENCOUNTER — Other Ambulatory Visit: Payer: Self-pay

## 2020-12-16 DIAGNOSIS — F410 Panic disorder [episodic paroxysmal anxiety] without agoraphobia: Secondary | ICD-10-CM | POA: Diagnosis not present

## 2020-12-16 DIAGNOSIS — R351 Nocturia: Secondary | ICD-10-CM | POA: Diagnosis not present

## 2020-12-16 DIAGNOSIS — Q541 Hypospadias, penile: Secondary | ICD-10-CM | POA: Diagnosis not present

## 2020-12-16 DIAGNOSIS — F411 Generalized anxiety disorder: Secondary | ICD-10-CM

## 2020-12-16 DIAGNOSIS — N401 Enlarged prostate with lower urinary tract symptoms: Secondary | ICD-10-CM | POA: Diagnosis not present

## 2020-12-16 DIAGNOSIS — N5201 Erectile dysfunction due to arterial insufficiency: Secondary | ICD-10-CM | POA: Diagnosis not present

## 2020-12-16 DIAGNOSIS — F422 Mixed obsessional thoughts and acts: Secondary | ICD-10-CM

## 2020-12-16 MED ORDER — FLUOXETINE HCL 40 MG PO CAPS: 40.0000 mg | ORAL_CAPSULE | Freq: Every day | ORAL | 1 refills | Status: DC

## 2020-12-16 NOTE — Progress Notes (Signed)
Jacob [name] [id number] Aug 06, 1965 55 y.o.  Subjective:   Patient ID:  Jacob [name] is a 55 y.o. (DOB 1965-08-17) male.  Chief Complaint:  Chief Complaint  Patient presents with   Anxiety   Depression    Jacob [name] presents to the office today for follow-up of anxiety.  Patient last seen 01/05/21.  He reports that since his last visit his son passed away one year ago due to addiction. Grandmother died almost 2 years ago on his birthday. He was engaged and he and his fiance split up up about 2 months ago after being together 10 years. He reports that fiance did not have children and did not understand how important his children and grandchildren are to him.   Now has 3 grandchildren. They are 78 yo and 46 yo. Daughter's husband is overseas and she and grandchildren have been coming over regularly. He reports that he and his older daughter have gotten closer.   Was working about 60 hours a week and then work hired some additional help. He received a raise and hours are now about 45 a week.   He reports "my anxiety is ok, my depression is not so great." He reports that he has been drinking every night since relationship ended. He reports that he is grieving the loss of his relationship and his son.  He reports, "I have a lot of regret... it still doesn't feel real." He reports having severe panic attack around son's death and denies other panic attacks.   He reports that he now will "pray about and give it to God." Occ intrusive thoughts- "but nothing like I used to." Worry has decreased significantly. He reports long-standing social anxiety around people he does not know well. He reports that he has some persistent sadness. He reports that his energy has been ok. He reports motivation is fair and has to push himself to do things at times. Appetite is stable. Sleep is fair and sleeps about 4-5 hours, wakes up, then sleeps another 1-2 hours. He reports having passive death  wishes about 6 months ago. Denies SI- "I know I don't want to die."   He reports that he has some difficulty finding words. Denies any worsening concentration.   He reports that he is drinking about 3/4 bottle of wine a night.  Psychiatric Medication Trials: Sertraline- mild tremor. Affective dulling Prozac. Xanax- Has taken since he was 22 or 55 yo. Ambien- parasomnia  Mini-Mental    Flowsheet Row Office Visit from 12/16/2020 in [place] Visit from 05/31/2018 in [place]  Total Score (max 30 points ) 30 30      PHQ2-9    [place] Office Visit from 07/24/2020 in [place] at [facility] Visit from 05/31/2020 in [place] at [facility] Visit from 10/08/2017 in [place] Visit from 08/29/2015 in [place] Visit from 10/07/2014 in [facility] at [facility] Total Score 3 0 0 0 0  PHQ-9 Total Score 7 -- -- -- --        Review of Systems:  Review of Systems  Musculoskeletal:  Negative for gait problem.  Neurological:  Positive for tremors.  Psychiatric/Behavioral:         Please refer to HPI   Medications: I have reviewed the patient's current medications.  Current Outpatient Medications  Medication Sig Dispense Refill  ALPRAZolam (XANAX) 0.5 MG tablet Take 1-2 tablets (0.5-1 mg total) by mouth at bedtime as needed. 60 tablet 2   aspirin EC 81 MG tablet Take 1 tablet (81 mg total) by mouth daily. 90 tablet 11   metFORMIN (GLUCOPHAGE-XR) 500 MG 24 hr tablet Take 4 tablets (2,000 mg total) by mouth daily with breakfast. 360 tablet 3   Multiple Vitamin (MULTIVITAMIN WITH MINERALS) TABS tablet Take 1 tablet by mouth daily.     Omega-3 Fatty Acids (FISH OIL PO) Take 1 capsule by mouth daily.     pantoprazole (PROTONIX) 40 MG tablet Take 1 tablet (40 mg total) by mouth 2 (two) times daily. 60 tablet 5   ramipril (ALTACE) 10 MG capsule  Take 1 capsule (10 mg total) by mouth daily. Annual appt is due in must see provider for future refills 90 capsule 3   rosuvastatin (CRESTOR) 10 MG tablet Take 1 tablet (10 mg total) by mouth daily. 90 tablet 3   tadalafil (CIALIS) 20 MG tablet TAKE 1 TABLET BY MOUTH EVERY DAY AS NEEDED 8 tablet 7   FLUoxetine (PROZAC) 40 MG capsule Take 1 capsule (40 mg total) by mouth daily. 90 capsule 1   meloxicam (MOBIC) 7.5 MG tablet Take 1 tablet (7.5 mg total) by mouth daily. (Patient not taking: Reported on 12/16/2020) 30 tablet 5   PEG-KCl-NaCl-NaSulf-Na Asc-C (PLENVU) 140 g SOLR Use as directed. Manufacturer's coupon Universal coupon code:BIN: C1589615; GROUP: [id number]; PCN: [id number]; IDCY:[id number]; PAY NO MORE $50; NO prior authorization (Patient not taking: Reported on 12/16/2020) 1 each 0   traMADol (ULTRAM) 50 MG tablet Take 1 tablet (50 mg total) by mouth every 6 (six) hours as needed. 30 tablet 2   triamcinolone (NASACORT) 55 MCG/ACT AERO nasal inhaler Place 2 sprays into the nose daily. 3 Inhaler 3   Vitamins/Minerals TABS Take by mouth.     No current facility-administered medications for this visit.    Medication Side Effects: None  Allergies: No Known Allergies  Past Medical History:  Diagnosis Date   Allergic rhinitis    Anxiety    Arthritis    Cholesteatoma of right ear    Diabetes mellitus ([place])    bordreline   Dysuria    Enlarged prostate    pt unaware   GERD (gastroesophageal reflux disease)    Headache    Hearing loss    Right ear   Hernia of abdominal wall    History of gross hematuria    History of head injury    History of kidney stones    History of palpitations    Hyperlipidemia 10/18/2014   Hypertension    Insomnia    Lack of concentration    Nasal fracture    Several   Otitis media 11/10/2017   Bilateral   Panic attacks    Syncope    Vertigo     Past Medical History, Surgical history, Social history, and Family history were reviewed and updated as  appropriate.   Please see review of systems for further details on the patient's review from today.   Objective:   Physical Exam:  There were no vitals taken for this visit.  Physical Exam Constitutional:      General: He is not in acute distress. Musculoskeletal:        General: No deformity.  Neurological:     Mental Status: He is alert and oriented to person, place, and time.     Coordination: Coordination normal.  Psychiatric:  Attention and Perception: Attention and perception normal. He does not perceive auditory or visual hallucinations.        Mood and Affect: Mood is depressed. Affect is not labile, blunt, angry or inappropriate.        Speech: Speech normal.        Behavior: Behavior normal.        Thought Content: Thought content normal. Thought content is not paranoid or delusional. Thought content does not include homicidal or suicidal ideation. Thought content does not include homicidal or suicidal plan.        Cognition and Memory: Cognition and memory normal.        Judgment: Judgment normal.     Comments: Insight intact Mood is less anxious compared to past exams    Lab Review:     Component Value Date/Time   NA 141 06/17/2020 1507   K 4.4 06/17/2020 1507   CL 104 06/17/2020 1507   CO2 29 06/17/2020 1507   GLUCOSE 134 (H) 06/17/2020 1507   BUN 14 06/17/2020 1507   CREATININE 1.25 06/17/2020 1507   CREATININE 1.37 (H) 10/07/2014 1202   CALCIUM 9.7 06/17/2020 1507   PROT 7.2 06/17/2020 1507   ALBUMIN 4.6 06/17/2020 1507   AST 16 06/17/2020 1507   ALT 25 06/17/2020 1507   ALKPHOS 62 06/17/2020 1507   BILITOT 0.5 06/17/2020 1507   GFRNONAA >60 03/09/2019 1335   GFRNONAA 61 10/07/2014 1202   GFRAA >60 03/09/2019 1335   GFRAA 70 10/07/2014 1202       Component Value Date/Time   WBC 4.8 03/09/2019 1335   RBC 5.38 03/09/2019 1335   HGB 16.0 03/09/2019 1335   HCT 47.9 03/09/2019 1335   PLT 220 03/09/2019 1335   MCV 89.0 03/09/2019 1335   MCH  29.7 03/09/2019 1335   MCHC 33.4 03/09/2019 1335   RDW 12.9 03/09/2019 1335   LYMPHSABS 1.2 08/10/2018 0825   MONOABS 0.3 08/10/2018 0825   EOSABS 0.1 08/10/2018 0825   BASOSABS 0.0 08/10/2018 0825    No results found for: POCLITH, LITHIUM   No results found for: PHENYTOIN, PHENOBARB, VALPROATE, CBMZ   .res Assessment: Plan:    Patient seen for 30 minutes and time spent reviewing changes in social and medical history since last visit over 2 years ago.  He reports that overall his anxiety has been better controlled, however he has been experiencing some depression in response to the loss of his son and 10 year relationship he reports that Prozac has been very helpful for his anxiety and does not think that he would be able to tolerate a higher dose.  Recommended therapy to process the losses and improve mood. Pt is amenable to seeing a therapist.  Referrals made. Also encouraged patient to attempt to gradually reduce alcohol use since alcohol is a depressant and regular use can exacerbate depression. Patient requests that MMSE to rule out cognitive changes since he reports that he continues to have occasional intrusive thoughts that he may be developing dementia.  Reassured patient that MMSE score was 30 out of 30 possible points today. Patient reports that his PCP may have recently refilled Prozac and does not need a refill at this time.  Discussed that this provider can send in Prozac if needed.  Plan is for patient to follow-up with his PCP for continued medication management and follow-up with this provider as needed. Patient advised to contact office with any questions, adverse effects, or acute worsening in signs and  symptoms.    Jacob [name] was seen today for anxiety and depression.  Diagnoses and all orders for this visit:  Mixed obsessional thoughts and acts -     FLUoxetine (PROZAC) 40 MG capsule; Take 1 capsule (40 mg total) by mouth daily.  Panic disorder -     FLUoxetine (PROZAC)  40 MG capsule; Take 1 capsule (40 mg total) by mouth daily.  Generalized anxiety disorder -     FLUoxetine (PROZAC) 40 MG capsule; Take 1 capsule (40 mg total) by mouth daily.    Please see After Visit Summary for patient specific instructions.  Future Appointments  Date Time Provider [place]  01/06/2021  4:30 PM [facility] PREVISIT RM 50 [facility]  01/20/2021  2:30 PM Pyrtle, Lajuan Lines, MD [facility]  02/17/2021 12:00 PM Shanon Ace, LCSW [facility] None    No orders of the defined types were placed in this encounter.   -------------------------------

## 2020-12-27 ENCOUNTER — Telehealth: Payer: Self-pay | Admitting: Internal Medicine

## 2020-12-27 DIAGNOSIS — R112 Nausea with vomiting, unspecified: Secondary | ICD-10-CM

## 2020-12-27 DIAGNOSIS — R197 Diarrhea, unspecified: Secondary | ICD-10-CM

## 2020-12-27 MED ORDER — ONDANSETRON HCL 4 MG PO TABS: 4.0000 mg | ORAL_TABLET | Freq: Four times a day (QID) | ORAL | 1 refills | Status: DC | PRN

## 2020-12-27 NOTE — Telephone Encounter (Signed)
Spoke with patient, he states that he has been having diarrhea for the past couple of days along with nausea and now vomiting. Pt states that he has been taking Emetrol OTC for nausea and it helps some but ever since the vomiting he has been nauseous consistently. Pt was wondering if he had a virus. He will try Imodium OTC for diarrhea. He was also looking to move his procedures but no available appt for a double prior to his currently scheduled day (he needs pre-visit appt). EGD and colon are scheduled for 01/20/21. Denies any fever. Please advise, thanks.

## 2020-12-27 NOTE — Telephone Encounter (Signed)
Spoke with patient in regards to recommendations. Pt will come to the lab in the basement on Monday for lab work and to pick up stool kit. He is aware that he will need to collect stool sample at home and then return to the lab. Pt is aware that RX for Zofran has been sent to pharmacy on file, he has confirmed. Pt verbalized understanding of all information and had no concerns at the end of the call.  Lab orders in epic.

## 2020-12-27 NOTE — Telephone Encounter (Signed)
Patient called states he has been having diarrhea and is seeking help before the weekend.

## 2020-12-27 NOTE — Telephone Encounter (Signed)
Ok, let do the following He needs GI pathogen panel, CBC, CMP Agree with trial of loperamide OTC per box instruction Can Rx: zofran 4 mg every 6 hr PRN nausea/vomiting -- sometimes this helps with diarrhea too  I would not move the procedure date until we know this is not an infectious process If labs/stools studies unrevealing and symptoms continue we can look for a soon EGD/colon date if available  Thanks JMP

## 2021-01-01 DIAGNOSIS — J069 Acute upper respiratory infection, unspecified: Secondary | ICD-10-CM | POA: Diagnosis not present

## 2021-01-01 DIAGNOSIS — N5201 Erectile dysfunction due to arterial insufficiency: Secondary | ICD-10-CM | POA: Diagnosis not present

## 2021-01-01 DIAGNOSIS — Z20822 Contact with and (suspected) exposure to covid-19: Secondary | ICD-10-CM | POA: Diagnosis not present

## 2021-01-03 ENCOUNTER — Other Ambulatory Visit: Payer: Self-pay | Admitting: Internal Medicine

## 2021-01-06 ENCOUNTER — Ambulatory Visit (AMBULATORY_SURGERY_CENTER): Payer: BC Managed Care – PPO | Admitting: *Deleted

## 2021-01-06 ENCOUNTER — Other Ambulatory Visit: Payer: Self-pay

## 2021-01-06 VITALS — Ht 72.0 in | Wt 185.0 lb

## 2021-01-06 DIAGNOSIS — K219 Gastro-esophageal reflux disease without esophagitis: Secondary | ICD-10-CM

## 2021-01-06 DIAGNOSIS — Z1212 Encounter for screening for malignant neoplasm of rectum: Secondary | ICD-10-CM

## 2021-01-06 DIAGNOSIS — J40 Bronchitis, not specified as acute or chronic: Secondary | ICD-10-CM | POA: Diagnosis not present

## 2021-01-06 DIAGNOSIS — R051 Acute cough: Secondary | ICD-10-CM | POA: Diagnosis not present

## 2021-01-06 DIAGNOSIS — Z1211 Encounter for screening for malignant neoplasm of colon: Secondary | ICD-10-CM

## 2021-01-06 MED ORDER — PLENVU 140 G PO SOLR: 1.0000 | Freq: Once | ORAL | 0 refills | Status: AC

## 2021-01-06 NOTE — Progress Notes (Signed)
Patient's pre-visit was done today over the phone with the patient due to COVID-19 pandemic. Name,DOB and address verified. Patient denies any allergies to Eggs and Soy. Patient denies any problems with anesthesia/sedation. Patient is not taking any diet pills or blood thinners. No home Oxygen. Packet of Prep instructions mailed to patient including a copy of a consent form & Coupon-pt is aware. Patient understands to call us back with any questions or concerns. Patient is aware of our care-partner policy and Covid-19 safety protocol.   EMMI education assigned to the patient for the procedure, sent to MyChart.   The patient is COVID-19 vaccinated.   

## 2021-01-07 ENCOUNTER — Encounter: Payer: Self-pay | Admitting: Internal Medicine

## 2021-01-09 NOTE — Progress Notes (Signed)
Subjective:    Patient ID: Jacob [name], male    DOB: 06-29-1965, 55 y.o.   MRN: [id number]  This visit occurred during the SARS-CoV-2 public health emergency.  Safety protocols were in place, including screening questions prior to the visit, additional usage of staff PPE, and extensive cleaning of exam room while observing appropriate contact time as indicated for disinfecting solutions.    HPI The patient is here for an acute visit.   Dizziness and pressure in ears x 2 weeks - he has dizziness and ears are clogged.  Hearing is worse.   Covid x 2 neg.  He went to urgent care - put on augmentin and then doxycycline.  He is currently taking the doxycycline.  He thinks his cold symptoms are improving.  He wears hearing aides - saw audiologist two days ago - said she she thought there was fluid in his ear.  His ears feel clogged and he cannot pop them.  He denies any ear pain or ear discharge.  He states nasal congestion, sinus pain, cough, wheezing, dizziness and headaches.  Many of his symptoms are improving, but the dizziness and ear pressure have been persistent and do not seem to be improving.  Using nasacort.  On doxycycline.    Medications and allergies reviewed with patient and updated if appropriate.  Patient Active Problem List   Diagnosis Date Noted   Insomnia 07/24/2020   Grief 06/02/2020   Right rotator cuff tear 10/27/2019   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   Sleep disorder 08/23/2018   Chronic dysfunction of both eustachian tubes 08/15/2018   Memory loss 08/11/2018   Dysuria 04/22/2018   Gross hematuria 04/22/2018   STD exposure 04/22/2018   Lack of concentration 12/21/2017   Panic attacks 12/21/2017   Obsessional thoughts 12/21/2017   Acute sinus infection 06/11/2017   Screening for colorectal cancer 04/09/2017   Nausea without vomiting 04/09/2017   Dysphagia 04/09/2017   Dyspepsia 11/19/2016   Right otitis media 09/09/2016    Vertigo 09/09/2016   Hearing loss, right 09/09/2016   BPH (benign prostatic hyperplasia) 05/30/2016   Right shoulder pain 05/30/2016   Acute midline low back pain with sciatica 04/21/2016   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 04/21/2016   Urinary frequency 02/05/2016   Syncope 09/27/2015   Left lateral epicondylitis 08/31/2015   Allergic rhinitis 08/29/2015   Hearing loss 06/05/2015   Hypersomnolence 06/05/2015   Chest pain 01/23/2015   Chronic pain syndrome 01/23/2015   Hyperlipidemia 10/18/2014   Impingement syndrome of right shoulder 06/28/2014   Hip flexor tendon tightness 06/28/2014   Right knee pain 06/28/2014   Depression with anxiety 01/30/2013   Diabetes mellitus without complication ([place]) 78/67/6720   Encounter for well adult exam with abnormal findings 01/11/2013   Essential hypertension, benign 01/11/2013   Low back pain syndrome 01/11/2013   DJD (degenerative joint disease) 01/11/2013   Erectile dysfunction 01/11/2013   Gastroesophageal reflux disease 12/30/2011    Current Outpatient Medications on File Prior to Visit  Medication Sig Dispense Refill   ALPRAZolam (XANAX) 0.5 MG tablet TAKE 1 TO 2 TABLETS BY MOUTH AT BEDTIME AS NEEDED 60 tablet 2   Ascorbic Acid (VITAMIN C PO) Take by mouth.     aspirin EC 81 MG tablet Take 1 tablet (81 mg total) by mouth daily. 90 tablet 11   Cholecalciferol (VITAMIN D3 PO) Take by mouth.     doxycycline (ADOXA) 100 MG tablet Take  100 mg by mouth 2 (two) times daily.     FLUoxetine (PROZAC) 40 MG capsule Take 1 capsule (40 mg total) by mouth daily. 90 capsule 1   metFORMIN (GLUCOPHAGE-XR) 500 MG 24 hr tablet Take 4 tablets (2,000 mg total) by mouth daily with breakfast. 360 tablet 3   Multiple Vitamin (MULTIVITAMIN WITH MINERALS) TABS tablet Take 1 tablet by mouth daily.     Omega-3 Fatty Acids (FISH OIL PO) Take 1 capsule by mouth daily.     pantoprazole (PROTONIX) 40 MG tablet Take 1 tablet (40 mg total) by mouth 2  (two) times daily. 60 tablet 5   ramipril (ALTACE) 10 MG capsule Take 1 capsule (10 mg total) by mouth daily. Annual appt is due in must see provider for future refills 90 capsule 3   rosuvastatin (CRESTOR) 10 MG tablet Take 1 tablet (10 mg total) by mouth daily. 90 tablet 3   tadalafil (CIALIS) 20 MG tablet TAKE 1 TABLET BY MOUTH EVERY DAY AS NEEDED 8 tablet 7   triamcinolone (NASACORT) 55 MCG/ACT AERO nasal inhaler Place 2 sprays into the nose daily. 3 Inhaler 3   No current facility-administered medications on file prior to visit.    Past Medical History:  Diagnosis Date   Allergic rhinitis    Anxiety    Arthritis    Cholesteatoma of right ear    Diabetes mellitus ([place])    bordreline   Dysuria    Enlarged prostate    pt unaware   GERD (gastroesophageal reflux disease)    Headache    Hearing loss    Right ear   Hernia of abdominal wall    History of gross hematuria    History of head injury    History of kidney stones    History of palpitations    Hyperlipidemia 10/18/2014   Hypertension    Insomnia    Lack of concentration    Nasal fracture    Several   Otitis media 11/10/2017   Bilateral   Panic attacks    Syncope    Vertigo     Past Surgical History:  Procedure Laterality Date   CHOLECYSTECTOMY     CYSTOSCOPY N/A 05/10/2018   Procedure: CYSTOSCOPY WITH EXAM UNDER ANESTHESIA;  Surgeon: Ceasar Mons, MD;  Location: [facility];  Service: Urology;  Laterality: N/A;  ONLY NEEDS 30 MIN   HYPOSPADIAS CORRECTION N/A 1978   INNER EAR SURGERY     right   LUMBAR DISC SURGERY     MASTOIDECTOMY Right    NOSE SURGERY     Fracture   tubes in bil ears     UPPER GI ENDOSCOPY  01/09/2012   Esophageal dilation    Social History   Socioeconomic History   Marital status: Single    Spouse name: Not on file   Number of children: 3   Years of education: Not on file   Highest education level: Not on file  Occupational History   Occupation:  Music therapist: [employer]  Tobacco Use   Smoking status: Never   Smokeless tobacco: Never  Vaping Use   Vaping Use: Never used  Substance and Sexual Activity   Alcohol use: Yes    Alcohol/week: 5.0 standard drinks    Types: 5 Glasses of wine per week    Comment: weekends   Drug use: No   Sexual activity: Yes    Partners: Female    Birth control/protection: None  Other  Topics Concern   Not on file  Social History Narrative   Not on file   Social Determinants of Health   Financial Resource Strain: Not on file  Food Insecurity: Not on file  Transportation Needs: Not on file  Physical Activity: Not on file  Stress: Not on file  Social Connections: Not on file    Family History  Problem Relation Age of Onset   COPD Mother    Hypertension Mother    Anxiety disorder Mother    Heart disease Father    Cancer Father    Diabetes Paternal Grandmother    Diabetes Maternal Grandmother    Anxiety disorder Maternal Grandmother    Heart attack Brother    Lung cancer Maternal Uncle    Anxiety disorder Maternal Uncle    Anxiety disorder Maternal Aunt    OCD Maternal Aunt    Anxiety disorder Cousin    ADD / ADHD Son    Drug abuse Son    Colon cancer Neg Hx    Esophageal cancer Neg Hx    Rectal cancer Neg Hx    Stomach cancer Neg Hx     Review of Systems  Constitutional:  Negative for fever.  HENT:  Positive for congestion, hearing loss and sinus pain. Negative for ear discharge, ear pain and sinus pressure (resolved).        Ears feel clogged  Respiratory:  Positive for cough and wheezing (mild one day). Negative for shortness of breath.   Neurological:  Positive for dizziness and headaches (mild sinus).      Objective:   Vitals:   01/10/21 0825  BP: 136/74  Pulse: 69  Temp: 98.4 F (36.9 C)  SpO2: 98%   BP Readings from Last 3 Encounters:  01/10/21 136/74  11/26/20 (!) 140/44  10/22/20 110/68   Wt Readings from Last 3 Encounters:  01/10/21 187 lb  (84.8 kg)  01/06/21 185 lb (83.9 kg)  11/26/20 186 lb (84.4 kg)   Body mass index is 25.36 kg/m.   Physical Exam Constitutional:      General: He is not in acute distress.    Appearance: Normal appearance. He is not ill-appearing.  HENT:     Head: Normocephalic and atraumatic.     Right Ear: Tympanic membrane, ear canal and external ear normal. There is no impacted cerumen.     Left Ear: Tympanic membrane, ear canal and external ear normal. There is no impacted cerumen.     Mouth/Throat:     Mouth: Mucous membranes are moist.     Pharynx: No oropharyngeal exudate or posterior oropharyngeal erythema.  Cardiovascular:     Rate and Rhythm: Normal rate and regular rhythm.  Pulmonary:     Effort: Pulmonary effort is normal. No respiratory distress.     Breath sounds: No wheezing or rales.  Musculoskeletal:     Cervical back: Neck supple. No tenderness.  Lymphadenopathy:     Cervical: No cervical adenopathy.  Skin:    General: Skin is warm and dry.  Neurological:     Mental Status: He is alert.           Assessment & Plan:    See Problem List for Assessment and Plan of chronic medical problems.

## 2021-01-10 ENCOUNTER — Other Ambulatory Visit: Payer: Self-pay

## 2021-01-10 ENCOUNTER — Encounter: Payer: Self-pay | Admitting: Internal Medicine

## 2021-01-10 ENCOUNTER — Ambulatory Visit: Payer: BC Managed Care – PPO | Admitting: Internal Medicine

## 2021-01-10 DIAGNOSIS — R42 Dizziness and giddiness: Secondary | ICD-10-CM

## 2021-01-10 DIAGNOSIS — H6983 Other specified disorders of Eustachian tube, bilateral: Secondary | ICD-10-CM

## 2021-01-10 DIAGNOSIS — J019 Acute sinusitis, unspecified: Secondary | ICD-10-CM | POA: Diagnosis not present

## 2021-01-10 MED ORDER — HYDROCOD POLST-CPM POLST ER 10-8 MG/5ML PO SUER: 5.0000 mL | Freq: Two times a day (BID) | ORAL | 0 refills | Status: DC | PRN

## 2021-01-10 MED ORDER — PREDNISONE 50 MG PO TABS: 50.0000 mg | ORAL_TABLET | Freq: Every day | ORAL | 0 refills | Status: DC

## 2021-01-10 NOTE — Assessment & Plan Note (Addendum)
Acute on chronic Related to sinus infection and also experiencing dizziness Complete doxycycline Prednisone 50 mg daily for 5 days-take with food Continue Nasacort

## 2021-01-10 NOTE — Assessment & Plan Note (Addendum)
Subacute Was seen at urgent care and has completed amoxicillin and currently on doxycycline His symptoms are improving except for some dizziness and eustachian tube dysfunction Continue and complete doxycycline Continue Nasacort His cough is affecting his sleep-we will start Tussionex cough syrup twice daily as needed-he is aware this will make him drowsy

## 2021-01-10 NOTE — Patient Instructions (Addendum)
     Medications changes include :  prednisone 50 mg x 5 days, cough syrup for night  Your prescription(s) have been submitted to your pharmacy. Please take as directed and contact our office if you believe you are having problem(s) with the medication(s).   Continue the nasacort nasal spray and doxycyline.

## 2021-01-10 NOTE — Assessment & Plan Note (Signed)
Acute He has had this in the past Symptoms are mild Meclizine in the past has made him very tired Advised that he can try nondrowsy Dramamine to see if that helps

## 2021-01-19 ENCOUNTER — Encounter: Payer: Self-pay | Admitting: Certified Registered Nurse Anesthetist

## 2021-01-20 ENCOUNTER — Encounter: Payer: Self-pay | Admitting: Internal Medicine

## 2021-01-20 ENCOUNTER — Ambulatory Visit (AMBULATORY_SURGERY_CENTER): Payer: BC Managed Care – PPO | Admitting: Internal Medicine

## 2021-01-20 VITALS — BP 97/57 | HR 67 | Temp 98.2°F | Resp 14 | Ht 72.0 in | Wt 185.0 lb

## 2021-01-20 DIAGNOSIS — D123 Benign neoplasm of transverse colon: Secondary | ICD-10-CM | POA: Diagnosis not present

## 2021-01-20 DIAGNOSIS — K317 Polyp of stomach and duodenum: Secondary | ICD-10-CM | POA: Diagnosis not present

## 2021-01-20 DIAGNOSIS — D122 Benign neoplasm of ascending colon: Secondary | ICD-10-CM

## 2021-01-20 DIAGNOSIS — K2970 Gastritis, unspecified, without bleeding: Secondary | ICD-10-CM

## 2021-01-20 DIAGNOSIS — Z1211 Encounter for screening for malignant neoplasm of colon: Secondary | ICD-10-CM

## 2021-01-20 DIAGNOSIS — R110 Nausea: Secondary | ICD-10-CM

## 2021-01-20 DIAGNOSIS — Z1212 Encounter for screening for malignant neoplasm of rectum: Secondary | ICD-10-CM

## 2021-01-20 DIAGNOSIS — K219 Gastro-esophageal reflux disease without esophagitis: Secondary | ICD-10-CM

## 2021-01-20 DIAGNOSIS — K2950 Unspecified chronic gastritis without bleeding: Secondary | ICD-10-CM | POA: Diagnosis not present

## 2021-01-20 MED ORDER — SODIUM CHLORIDE 0.9 % IV SOLN: 500.0000 mL | Freq: Once | INTRAVENOUS | Status: DC

## 2021-01-20 NOTE — Progress Notes (Signed)
GASTROENTEROLOGY PROCEDURE H&P NOTE   Primary Care Physician: Biagio Borg, MD    Reason for Procedure:  GERD, nausea, family history of colon cancer  Plan:    EGD and screening colonoscopy  Patient is appropriate for endoscopic procedure(s) in the ambulatory ([place]) setting.  The nature of the procedure, as well as the risks, benefits, and alternatives were carefully and thoroughly reviewed with the patient. Ample time for discussion and questions allowed. The patient understood, was satisfied, and agreed to proceed.     HPI: Jacob [name] is a 55 y.o. male who presents for EGD and screening colonoscopy.  Medical history as below.  His father had colon cancer which was treated surgically.  Seen earlier this year in the office by Alonza Bogus, PA-C.  No complaints today including chest pain, shortness of breath or dyspnea.  Tolerated the prep.  Past Medical History:  Diagnosis Date   Allergic rhinitis    Anxiety    Arthritis    Cholesteatoma of right ear    Diabetes mellitus ([place])    bordreline   Dysuria    Enlarged prostate    pt unaware   GERD (gastroesophageal reflux disease)    Headache    Hearing loss    Right ear   Hernia of abdominal wall    History of gross hematuria    History of head injury    History of kidney stones    History of palpitations    Hyperlipidemia 10/18/2014   Hypertension    Insomnia    Lack of concentration    Nasal fracture    Several   Otitis media 11/10/2017   Bilateral   Panic attacks    Syncope    Vertigo     Past Surgical History:  Procedure Laterality Date   CHOLECYSTECTOMY     CYSTOSCOPY N/A 05/10/2018   Procedure: CYSTOSCOPY WITH EXAM UNDER ANESTHESIA;  Surgeon: Ceasar Mons, MD;  [facility];  Service: Urology;  Laterality: N/A;  ONLY NEEDS 30 MIN   HYPOSPADIAS CORRECTION N/A 1978   INNER EAR SURGERY     right   LUMBAR [place] SURGERY     MASTOIDECTOMY Right    NOSE SURGERY      Fracture   tubes in bil ears     UPPER GASTROINTESTINAL ENDOSCOPY     UPPER GI ENDOSCOPY  01/09/2012   Esophageal dilation    Prior to Admission medications   Medication Sig Start Date End Date Taking? Authorizing Provider  ALPRAZolam Duanne Moron) 0.5 MG tablet TAKE 1 TO 2 TABLETS BY MOUTH AT BEDTIME AS NEEDED 01/03/21  Yes Biagio Borg, MD  Ascorbic Acid (VITAMIN C PO) Take by mouth.   Yes [provider]  aspirin EC 81 MG tablet Take 1 tablet (81 mg total) by mouth daily. 05/09/14  Yes Biagio Borg, MD  Cholecalciferol (VITAMIN D3 PO) Take by mouth.   Yes [provider]  FLUoxetine (PROZAC) 40 MG capsule Take 1 capsule (40 mg total) by mouth daily. 12/16/20  Yes Thayer Headings, PMHNP  metFORMIN (GLUCOPHAGE-XR) 500 MG 24 hr tablet Take 4 tablets (2,000 mg total) by mouth daily with breakfast. 07/24/20  Yes Biagio Borg, MD  Multiple Vitamin (MULTIVITAMIN WITH MINERALS) TABS tablet Take 1 tablet by mouth daily.   Yes [provider]  pantoprazole (PROTONIX) 40 MG tablet Take 1 tablet (40 mg total) by mouth 2 (two) times daily. 09/04/20  Yes Zehr, Laban Emperor,  PA-C  ramipril (ALTACE) 10 MG capsule Take 1 capsule (10 mg total) by mouth daily. Annual appt is due in must see provider for future refills 05/31/20  Yes Biagio Borg, MD  rosuvastatin (CRESTOR) 10 MG tablet Take 1 tablet (10 mg total) by mouth daily. 05/31/20  Yes Biagio Borg, MD  tadalafil (CIALIS) 20 MG tablet TAKE 1 TABLET BY MOUTH EVERY DAY AS NEEDED 03/17/20  Yes Biagio Borg, MD  triamcinolone (NASACORT) 55 MCG/ACT AERO nasal inhaler Place 2 sprays into the nose daily. 05/12/19  Yes Biagio Borg, MD  chlorpheniramine-HYDROcodone [facility] ER) 10-8 MG/5ML SUER Take 5 mLs by mouth every 12 (twelve) hours as needed for cough. 01/10/21   Binnie Rail, MD  doxycycline (ADOXA) 100 MG tablet Take 100 mg by mouth 2 (two) times daily. Patient not taking: Reported on 01/20/2021 01/06/21   [provider]  Omega-3 Fatty Acids (FISH OIL PO) Take 1 capsule by mouth daily.    [provider]  predniSONE (DELTASONE) 50 MG tablet Take 1 tablet (50 mg total) by mouth daily with breakfast. 01/10/21   Binnie Rail, MD    Current Outpatient Medications  Medication Sig Dispense Refill   ALPRAZolam (XANAX) 0.5 MG tablet TAKE 1 TO 2 TABLETS BY MOUTH AT BEDTIME AS NEEDED 60 tablet 2   Ascorbic Acid (VITAMIN C PO) Take by mouth.     aspirin EC 81 MG tablet Take 1 tablet (81 mg total) by mouth daily. 90 tablet 11   Cholecalciferol (VITAMIN D3 PO) Take by mouth.     FLUoxetine (PROZAC) 40 MG capsule Take 1 capsule (40 mg total) by mouth daily. 90 capsule 1   metFORMIN (GLUCOPHAGE-XR) 500 MG 24 hr tablet Take 4 tablets (2,000 mg total) by mouth daily with breakfast. 360 tablet 3   Multiple Vitamin (MULTIVITAMIN WITH MINERALS) TABS tablet Take 1 tablet by mouth daily.     pantoprazole (PROTONIX) 40 MG tablet Take 1 tablet (40 mg total) by mouth 2 (two) times daily. 60 tablet 5   ramipril (ALTACE) 10 MG capsule Take 1 capsule (10 mg total) by mouth daily. Annual appt is due in must see provider for future refills 90 capsule 3   rosuvastatin (CRESTOR) 10 MG tablet Take 1 tablet (10 mg total) by mouth daily. 90 tablet 3   tadalafil (CIALIS) 20 MG tablet TAKE 1 TABLET BY MOUTH EVERY DAY AS NEEDED 8 tablet 7   triamcinolone (NASACORT) 55 MCG/ACT AERO nasal inhaler Place 2 sprays into the nose daily. 3 Inhaler 3   chlorpheniramine-HYDROcodone (TUSSIONEX PENNKINETIC ER) 10-8 MG/5ML SUER Take 5 mLs by mouth every 12 (twelve) hours as needed for cough. 115 mL 0   doxycycline (ADOXA) 100 MG tablet Take 100 mg by mouth 2 (two) times daily. (Patient not taking: Reported on 01/20/2021)     Omega-3 Fatty Acids (FISH OIL PO) Take 1 capsule by mouth daily.     predniSONE (DELTASONE) 50 MG tablet Take 1 tablet (50 mg total) by mouth daily with breakfast. 5 tablet 0   Current Facility-Administered  Medications  Medication Dose Route Frequency Provider Last Rate Last Admin   0.9 %  sodium chloride infusion  500 mL Intravenous Once Israel Werts, Lajuan Lines, MD        Allergies as of 01/20/2021   (No Known Allergies)    Family History  Problem Relation Age of Onset   COPD Mother    Hypertension Mother    Anxiety  disorder Mother    Heart disease Father    Cancer Father    Diabetes Paternal Grandmother    Diabetes Maternal Grandmother    Anxiety disorder Maternal Grandmother    Heart attack Brother    Lung cancer Maternal Uncle    Anxiety disorder Maternal Uncle    Anxiety disorder Maternal Aunt    OCD Maternal Aunt    Anxiety disorder Cousin    ADD / ADHD Son    Drug abuse Son    Colon cancer Neg Hx    Esophageal cancer Neg Hx    Rectal cancer Neg Hx    Stomach cancer Neg Hx     Social History   Socioeconomic History   Marital status: Single    Spouse name: Not on file   Number of children: 3   Years of education: Not on file   Highest education level: Not on file  Occupational History   Occupation: Music therapist: [employer]  Tobacco Use   Smoking status: Never   Smokeless tobacco: Never  Vaping Use   Vaping Use: Never used  Substance and Sexual Activity   Alcohol use: Yes    Alcohol/week: 5.0 standard drinks    Types: 5 Glasses of wine per week    Comment: weekends   Drug use: No   Sexual activity: Yes    Partners: Female    Birth control/protection: None  Other Topics Concern   Not on file  Social History Narrative   Not on file   Social Determinants of Health   Financial Resource Strain: Not on file  Food Insecurity: Not on file  Transportation Needs: Not on file  Physical Activity: Not on file  Stress: Not on file  Social Connections: Not on file  Intimate Partner Violence: Not on file    Physical Exam: Vital signs in last 24 hours: @BP  132/71   Pulse 65   Temp 98.2 F (36.8 C) (Temporal)   Ht 6' (1.829 m)   Wt 185 lb (83.9 kg)    SpO2 99%   BMI 25.09 kg/m  GEN: NAD EYE: Sclerae anicteric ENT: MMM CV: Non-tachycardic Pulm: CTA b/l GI: Soft, NT/ND NEURO:  Alert & Oriented x 3   Zenovia Jarred, MD [facility]  01/20/2021 2:11 PM

## 2021-01-20 NOTE — Progress Notes (Signed)
Report given to PACU, vss 

## 2021-01-20 NOTE — Op Note (Signed)
[place] Patient Name: Jacob [name] Procedure Date: 01/20/2021 2:17 PM MRN: [id number] Endoscopist: Jerene Bears , MD Age: 55 Referring MD:  Date of Birth: 09/24/1965 Gender: Male Account #: [id number] Procedure:                Upper GI endoscopy Indications:              Gastro-esophageal reflux disease, Nausea without                            vomiting (intermittent, but nearly daily) Medicines:                Monitored Anesthesia Care Procedure:                Pre-Anesthesia Assessment:                           - Prior to the procedure, a History and Physical                            was performed, and patient medications and                            allergies were reviewed. The patient's tolerance of                            previous anesthesia was also reviewed. The risks                            and benefits of the procedure and the sedation                            options and risks were discussed with the patient.                            All questions were answered, and informed consent                            was obtained. Prior Anticoagulants: The patient has                            taken no previous anticoagulant or antiplatelet                            agents. ASA Grade Assessment: II - A patient with                            mild systemic disease. After reviewing the risks                            and benefits, the patient was deemed in                            satisfactory condition to undergo the procedure.  After obtaining informed consent, the endoscope was                            passed under direct vision. Throughout the                            procedure, the patient's blood pressure, pulse, and                            oxygen saturations were monitored continuously. The                            Endoscope was introduced through the mouth, and                            advanced to the second  part of duodenum. The upper                            GI endoscopy was accomplished without difficulty.                            The patient tolerated the procedure well. Scope In: Scope Out: Findings:                 The examined esophagus was normal.                           Multiple small sessile and semi-sessile polyps with                            no bleeding were found in the gastric fundus and in                            the gastric body. Multiple biopsies were obtained                            with cold forceps for histology in a targeted                            manner for sampling. These polyps have the typical                            appearance of benign fundic gland polyps.                           Mild inflammation characterized by erythema was                            found in the gastric antrum. Biopsies were taken                            with a cold forceps for histology and Helicobacter  pylori testing.                           The examined duodenum was normal. Complications:            No immediate complications. Estimated Blood Loss:     Estimated blood loss was minimal. Impression:               - Normal esophagus.                           - Multiple gastric polyps. Benign.                           - Gastritis. Biopsied to exclude H. Pylori                            infection.                           - Normal examined duodenum.                           - Multiple biopsies were obtained. Recommendation:           - Patient has a contact number available for                            emergencies. The signs and symptoms of potential                            delayed complications were discussed with the                            patient. Return to normal activities tomorrow.                            Written discharge instructions were provided to the                            patient.                            - Resume previous diet.                           - Continue present medications.                           - Await pathology results. Jerene Bears, MD 01/20/2021 3:03:13 PM This report has been signed electronically.

## 2021-01-20 NOTE — Patient Instructions (Signed)
Handouts given on polyps.  YOU HAD AN ENDOSCOPIC PROCEDURE TODAY AT [facility]:   Refer to the procedure report that was given to you for any specific questions about what was found during the examination.  If the procedure report does not answer your questions, please call your gastroenterologist to clarify.  If you requested that your care partner not be given the details of your procedure findings, then the procedure report has been included in a sealed envelope for you to review at your convenience later.  YOU SHOULD EXPECT: Some feelings of bloating in the abdomen. Passage of more gas than usual.  Walking can help get rid of the air that was put into your GI tract during the procedure and reduce the bloating. If you had a lower endoscopy (such as a colonoscopy or flexible sigmoidoscopy) you may notice spotting of blood in your stool or on the toilet paper. If you underwent a bowel prep for your procedure, you may not have a normal bowel movement for a few days.  Please Note:  You might notice some irritation and congestion in your nose or some drainage.  This is from the oxygen used during your procedure.  There is no need for concern and it should clear up in a day or so.  SYMPTOMS TO REPORT IMMEDIATELY:  Following lower endoscopy (colonoscopy or flexible sigmoidoscopy):  Excessive amounts of blood in the stool  Significant tenderness or worsening of abdominal pains  Swelling of the abdomen that is new, acute  Fever of 100F or higher  Following upper endoscopy (EGD)  Vomiting of blood or coffee ground material  New chest pain or pain under the shoulder blades  Painful or persistently difficult swallowing  New shortness of breath  Fever of 100F or higher  Black, tarry-looking stools  For urgent or emergent issues, a gastroenterologist can be reached at any hour by calling [phone]. Do not use MyChart messaging for urgent concerns.    DIET:  We do recommend a  small meal at first, but then you may proceed to your regular diet.  Drink plenty of fluids but you should avoid alcoholic beverages for 24 hours.  ACTIVITY:  You should plan to take it easy for the rest of today and you should NOT DRIVE or use heavy machinery until tomorrow (because of the sedation medicines used during the test).    FOLLOW UP: Our staff will call the number listed on your records 48-72 hours following your procedure to check on you and address any questions or concerns that you may have regarding the information given to you following your procedure. If we do not reach you, we will leave a message.  We will attempt to reach you two times.  During this call, we will ask if you have developed any symptoms of COVID 19. If you develop any symptoms (ie: fever, flu-like symptoms, shortness of breath, cough etc.) before then, please call [phone].  If you test positive for Covid 19 in the 2 weeks post procedure, please call and report this information to [place].    If any biopsies were taken you will be contacted by phone or by letter within the next 1-3 weeks.  Please call us at [phone] if you have not heard about the biopsies in 3 weeks.    SIGNATURES/CONFIDENTIALITY: You and/or your care partner have signed paperwork which will be entered into your electronic medical record.  These signatures attest to the fact that that the  information above on your After Visit Summary has been reviewed and is understood.  Full responsibility of the confidentiality of this discharge information lies with you and/or your care-partner.

## 2021-01-20 NOTE — Progress Notes (Signed)
VS completed by AS.   HIPPA  Pt's states no medical or surgical changes since previsit or office visit.

## 2021-01-20 NOTE — Progress Notes (Signed)
Called to room to assist during endoscopic procedure.  Patient ID and intended procedure confirmed with present staff. Received instructions for my participation in the procedure from the performing physician.  

## 2021-01-20 NOTE — Op Note (Signed)
[place] Patient Name: Jacob [name] Procedure Date: 01/20/2021 2:15 PM MRN: [id number] Endoscopist: Jerene Bears , MD Age: 55 Referring MD:  Date of Birth: Dec 14, 1965 Gender: Male Account #: [id number] Procedure:                Colonoscopy Indications:              Screening for colorectal malignant neoplasm, This                            is the patient's first colonoscopy Medicines:                Monitored Anesthesia Care Procedure:                Pre-Anesthesia Assessment:                           - Prior to the procedure, a History and Physical                            was performed, and patient medications and                            allergies were reviewed. The patient's tolerance of                            previous anesthesia was also reviewed. The risks                            and benefits of the procedure and the sedation                            options and risks were discussed with the patient.                            All questions were answered, and informed consent                            was obtained. Prior Anticoagulants: The patient has                            taken no previous anticoagulant or antiplatelet                            agents. ASA Grade Assessment: II - A patient with                            mild systemic disease. After reviewing the risks                            and benefits, the patient was deemed in                            satisfactory condition to undergo the procedure.  After obtaining informed consent, the colonoscope                            was passed under direct vision. Throughout the                            procedure, the patient's blood pressure, pulse, and                            oxygen saturations were monitored continuously. The                            Olympus CF-HQ190L (Serial# [id number]) Colonoscope was                            introduced through the anus and  advanced to the                            cecum, identified by appendiceal orifice and                            ileocecal valve. The colonoscopy was performed                            without difficulty. The patient tolerated the                            procedure well. The quality of the bowel                            preparation was good. The ileocecal valve,                            appendiceal orifice, and rectum were photographed. Scope In: 2:41:31 PM Scope Out: 2:57:58 PM Scope Withdrawal Time: 0 hours 13 minutes 11 seconds  Total Procedure Duration: 0 hours 16 minutes 27 seconds  Findings:                 The digital rectal exam was normal.                           Three sessile polyps were found in the ascending                            colon. The polyps were 3 to 5 mm in size. These                            polyps were removed with a cold snare. Resection                            and retrieval were complete.                           Two sessile polyps were found in the transverse  colon. The polyps were 3 to 4 mm in size. These                            polyps were removed with a cold snare. Resection                            and retrieval were complete.                           Internal hemorrhoids were found during                            retroflexion. The hemorrhoids were small. Complications:            No immediate complications. Estimated Blood Loss:     Estimated blood loss was minimal. Impression:               - Three 3 to 5 mm polyps in the ascending colon,                            removed with a cold snare. Resected and retrieved.                           - Two 3 to 4 mm polyps in the transverse colon,                            removed with a cold snare. Resected and retrieved.                           - Small internal hemorrhoids. Recommendation:           - Patient has a contact number available for                             emergencies. The signs and symptoms of potential                            delayed complications were discussed with the                            patient. Return to normal activities tomorrow.                            Written discharge instructions were provided to the                            patient.                           - Resume previous diet.                           - Continue present medications.                           - Await  pathology results.                           - Repeat colonoscopy is recommended. The                            colonoscopy date will be determined after pathology                            results from today's exam become available for                            review. Jerene Bears, MD 01/20/2021 3:04:57 PM This report has been signed electronically.

## 2021-01-20 NOTE — Progress Notes (Signed)
1419 Robinul 0.1 mg IV given due large amount of secretions upon assessment.  MD made aware, vss

## 2021-01-21 ENCOUNTER — Telehealth: Payer: Self-pay | Admitting: Internal Medicine

## 2021-01-21 NOTE — Telephone Encounter (Signed)
Returned pts call and reviewed findings with him.  Answered questions regarding pathology taken.

## 2021-01-21 NOTE — Telephone Encounter (Signed)
Inbound call from patient requesting a call from a nurse please in regards to his procedure he had yesterday and the findings.

## 2021-01-22 ENCOUNTER — Telehealth: Payer: Self-pay

## 2021-01-22 NOTE — Telephone Encounter (Signed)
Left message on answering machine. 

## 2021-01-22 NOTE — Telephone Encounter (Signed)
Attempted f/u call. No answer, left VM. 

## 2021-01-25 ENCOUNTER — Emergency Department (HOSPITAL_COMMUNITY)
Admission: EM | Admit: 2021-01-25 | Discharge: 2021-01-25 | Discharge status: Against Medical Advice | Payer: BC Managed Care – PPO

## 2021-01-25 NOTE — ED Notes (Signed)
Pt states he is leaving and doesn't want to be seen.

## 2021-01-25 NOTE — ED Triage Notes (Signed)
Pt to triage via [facility] from home.  Reports CP associated with anxiety.  Pain resolved after calling 911.  CBG 143  Refused IV ASA 324mg  prior to EMS arrival

## 2021-01-28 ENCOUNTER — Encounter: Payer: Self-pay | Admitting: Internal Medicine

## 2021-02-03 DIAGNOSIS — Z9089 Acquired absence of other organs: Secondary | ICD-10-CM | POA: Insufficient documentation

## 2021-02-03 DIAGNOSIS — H90A31 Mixed conductive and sensorineural hearing loss, unilateral, right ear with restricted hearing on the contralateral side: Secondary | ICD-10-CM | POA: Diagnosis not present

## 2021-02-03 DIAGNOSIS — H9311 Tinnitus, right ear: Secondary | ICD-10-CM | POA: Diagnosis not present

## 2021-02-03 DIAGNOSIS — H6982 Other specified disorders of Eustachian tube, left ear: Secondary | ICD-10-CM | POA: Diagnosis not present

## 2021-02-10 DIAGNOSIS — N5201 Erectile dysfunction due to arterial insufficiency: Secondary | ICD-10-CM | POA: Diagnosis not present

## 2021-02-10 DIAGNOSIS — R350 Frequency of micturition: Secondary | ICD-10-CM | POA: Diagnosis not present

## 2021-02-13 DIAGNOSIS — R0602 Shortness of breath: Secondary | ICD-10-CM | POA: Diagnosis not present

## 2021-02-13 DIAGNOSIS — E119 Type 2 diabetes mellitus without complications: Secondary | ICD-10-CM | POA: Diagnosis not present

## 2021-02-13 DIAGNOSIS — J101 Influenza due to other identified influenza virus with other respiratory manifestations: Secondary | ICD-10-CM | POA: Diagnosis not present

## 2021-02-17 ENCOUNTER — Other Ambulatory Visit: Payer: Self-pay

## 2021-02-17 ENCOUNTER — Ambulatory Visit (INDEPENDENT_AMBULATORY_CARE_PROVIDER_SITE_OTHER): Payer: BC Managed Care – PPO | Admitting: Psychiatry

## 2021-02-17 DIAGNOSIS — F411 Generalized anxiety disorder: Secondary | ICD-10-CM | POA: Diagnosis not present

## 2021-02-17 NOTE — Progress Notes (Signed)
[facility] Counselor Initial Adult Exam  Name: Jacob [name] Date: 02/17/2021 MRN: [id number] DOB: May 13, 1965 PCP: Biagio Borg, MD  Time spent: 60 minutes  Guardian/Payee:  patient    Paperwork requested:  No   Reason for Visit /Presenting Problem: anxiety, depression, multiple family losses (grandmother, both parents, and 53 yr old son in 2021). Processing losses and hoping to improve mood. Still drinks wine some to deal with the pain but states I only drink 1 glass at night, maybe a little more on weekend. Grief issues unresolved.  Mental Status Exam:    Appearance:   Casual     Behavior:  Appropriate, Sharing, and Motivated  Motor:  Normal  Speech/Language:   Clear and Coherent  Affect:  Anxious, depression  Mood:  anxious and depressed  Thought process:  goal directed  Thought content:    Obsessions  Sensory/Perceptual disturbances:    WNL  Orientation:  oriented to person, place, time/date, situation, day of week, month of year, year, and stated date of Nov. 21, 2022  Attention:  Good  Concentration:  Good and Fair  Memory:  Reports some memory concerns  Fund of knowledge:   Good  Insight:    Good and Fair  Judgment:   Good and Fair  Impulse Control:  Fair   Reported Symptoms:  see symptoms noted above.  Risk Assessment: Danger to Self:  No Self-injurious Behavior: No Danger to Others: No Duty to Warn:no Physical Aggression / Violence:No  Access to Firearms a concern: No  Gang Involvement:No  Patient / guardian was educated about steps to take if suicide or homicide risk level increases between visits: Denies any SI or HI. While future psychiatric events cannot be accurately predicted, the patient does not currently require acute inpatient psychiatric care and does not currently meet [facility] involuntary commitment criteria.  Substance Abuse History: Current substance abuse:  drinks 1 glass per night, "some little more on weekends"     Past  Psychiatric History:   Previous psychological history is significant for anxiety, depression, and SA Outpatient Providers:Carson Sarvis History of Psych Hospitalization: No  Psychological Testing:  n/a    Abuse History: Victim of No.,  n/a    Report needed: No. Victim of Neglect:No. Perpetrator of  none   Witness / Exposure to Domestic Violence: No   Protective Services Involvement: No  Witness to [employer] Violence:  No   Family History:  Review and patient confirms info below. Family History  Problem Relation Age of Onset   COPD Mother    Hypertension Mother    Anxiety disorder Mother    Heart disease Father    Cancer Father    Diabetes Paternal Grandmother    Diabetes Maternal Grandmother    Anxiety disorder Maternal Grandmother    Heart attack Brother    Lung cancer Maternal Uncle    Anxiety disorder Maternal Uncle    Anxiety disorder Maternal Aunt    OCD Maternal Aunt    Anxiety disorder Cousin    ADD / ADHD Son    Drug abuse Son    Colon cancer Neg Hx    Esophageal cancer Neg Hx    Rectal cancer Neg Hx    Stomach cancer Neg Hx     Living situation: the patient lives alone  Sexual Orientation:  Straight  Relationship Status: divorced  Name of spouse / other:n/a             If a parent, number of children /  ages:2 ages 63 and 70, both females.  Real close with youngest and is working to repair relationship with older daughter. 3 Grandchildren, 2 boys and a girl, ages 1,2, 3. Close to his grandkids. Support Systems; friends Some friends and some family.  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  Any cultural differences that may affect / interfere with treatment:  not applicable   Recreation/Hobbies: relaxing at home.  Stressors:Health problems   ("diabetic and  hypochondriac")  Strengths:  a few friends and family is supportive  Barriers:  "if I quit", I need to be able to take criticism, need to work on my grief.  Legal History: Pending legal issue / charges:  not clear info but had 1 charge of assault on male. History of legal issue / charges:  assault on male  Medical History/Surgical History: Reviewed with Patient and her confirmed info below. Past Medical History:  Diagnosis Date   Allergic rhinitis    Anxiety    Arthritis    Cholesteatoma of right ear    Diabetes mellitus ([place])    bordreline   Dysuria    Enlarged prostate    pt unaware   GERD (gastroesophageal reflux disease)    Headache    Hearing loss    Right ear   Hernia of abdominal wall    History of gross hematuria    History of head injury    History of kidney stones    History of palpitations    Hyperlipidemia 10/18/2014   Hypertension    Insomnia    Lack of concentration    Nasal fracture    Several   Otitis media 11/10/2017   Bilateral   Panic attacks    Syncope    Vertigo     Past Surgical History:  Procedure Laterality Date   CHOLECYSTECTOMY     CYSTOSCOPY N/A 05/10/2018   Procedure: CYSTOSCOPY WITH EXAM UNDER ANESTHESIA;  Surgeon: Ceasar Mons, MD;  Location: [facility];  Service: Urology;  Laterality: N/A;  ONLY NEEDS 30 MIN   HYPOSPADIAS CORRECTION N/A 1978   INNER EAR SURGERY     right   LUMBAR [place] SURGERY     MASTOIDECTOMY Right    NOSE SURGERY     Fracture   tubes in bil ears     UPPER GASTROINTESTINAL ENDOSCOPY     UPPER GI ENDOSCOPY  01/09/2012   Esophageal dilation    Medications: Reviewed and patient confirms info below. Current Outpatient Medications  Medication Sig Dispense Refill   ALPRAZolam (XANAX) 0.5 MG tablet TAKE 1 TO 2 TABLETS BY MOUTH AT BEDTIME AS NEEDED 60 tablet 2   Ascorbic Acid (VITAMIN C PO) Take by mouth.     aspirin EC 81 MG tablet Take 1 tablet (81 mg total) by mouth daily. 90  tablet 11   chlorpheniramine-HYDROcodone (TUSSIONEX PENNKINETIC ER) 10-8 MG/5ML SUER Take 5 mLs by mouth every 12 (twelve) hours as needed for cough. 115 mL 0   Cholecalciferol (VITAMIN D3 PO) Take by mouth.     doxycycline (ADOXA) 100 MG tablet Take 100 mg by mouth 2 (two) times daily. (Patient not taking: Reported on 01/20/2021)     FLUoxetine (PROZAC) 40 MG capsule Take 1 capsule (40 mg total) by mouth daily. 90 capsule 1   metFORMIN (GLUCOPHAGE-XR) 500 MG 24 hr tablet  Take 4 tablets (2,000 mg total) by mouth daily with breakfast. 360 tablet 3   Multiple Vitamin (MULTIVITAMIN WITH MINERALS) TABS tablet Take 1 tablet by mouth daily.     Omega-3 Fatty Acids (FISH OIL PO) Take 1 capsule by mouth daily.     pantoprazole (PROTONIX) 40 MG tablet Take 1 tablet (40 mg total) by mouth 2 (two) times daily. 60 tablet 5   predniSONE (DELTASONE) 50 MG tablet Take 1 tablet (50 mg total) by mouth daily with breakfast. 5 tablet 0   ramipril (ALTACE) 10 MG capsule Take 1 capsule (10 mg total) by mouth daily. Annual appt is due in must see provider for future refills 90 capsule 3   rosuvastatin (CRESTOR) 10 MG tablet Take 1 tablet (10 mg total) by mouth daily. 90 tablet 3   tadalafil (CIALIS) 20 MG tablet TAKE 1 TABLET BY MOUTH EVERY DAY AS NEEDED 8 tablet 7   triamcinolone (NASACORT) 55 MCG/ACT AERO nasal inhaler Place 2 sprays into the nose daily. 3 Inhaler 3   No current facility-administered medications for this visit.    No Known Allergies  Reports no known allergies as of today 02/17/2021.  Diagnoses:    ICD-10-CM   1. Generalized anxiety disorder  F41.1      Treatment goal plan:  Patient not signing treatment goal plan on computer screen due to COVID. Treatment goals: Treatment goals remain on treatment plan as patient works with strategies to achieve his goals.  Progress is assessed each session and documented in the "progress" section of treatment note. Long-term goal: Reduce overall level,  frequency, and intensity of the anxiety so that daily functioning is not impaired. Short-term goal: Verbalize an understanding of the role that anxious/fearful thoughts and fears play in excessive worry and persistent anxiety symptoms. Strategies: Help the patient develop reality-based, positive cognitive messages.    Plan of Care:  Today is patient's first session with this therapist and we completed his initial evaluation and initial treatment plan.  He presents with anxiety, depression, and multiple family losses including grandmother, both parents, and his 74 year old son who died in 06-27-19 and has been the more difficult adjustment for patient.  Patient states he drinks wine to sometimes deal with his pain but "I only drink 1 glass a night except a little more on the weekend".  Patient was referred by his med provider. Patient is a divorced 55 yr old man, with 2 adult daughters (ages 60 and 42) and 3 grandchildren ages58, 49, 50 yrs old.  Is close to both daughters and grandchildren. His son committed suicide at age 63 last year due to overdose and patient believes it was accidental overdose. Both of patient's parents are deceased. Has 1 brother but not particularly close. 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 He is coming to therapy to help him process his losses, improve his mood, and make better decisions, some of which are related to using alcohol to cope even though he reports limited amounts.  States he was seeing another therapist 1-2 years ago "only for about 3-4 months." Denies any thoughts to harm self or others. Has a sense of hopefulness for himself "but it's hard sometimes". The loss of his son last year and loss of a 10 yr relationship with GF. Educationally, attended through the 11th grade.  He reports that he currently lives alone but he has a brother who may be coming to live with him soon due to 2 problems with that brother and  his girlfriend.  He reports having a couple of good  friends.  Has been attending [employer] which he says he sleeps in the door and sleeps out because he feels t "he people there are better than me."  Reports not having any particular interests but does spend time at home watching TV and "relaxing".  Current health stressors include being diabetic and "hypochondriac", per patient report.  He sees his strengths as including a few friends and his family is supportive.  States that his barriers to treatment would be "if I quit or if I cannot take any criticism or if I do not work on my grief".  I did explain to him that my role with him will not be to criticize him but to help him gain insight himself and make some needed changes with the support of a therapist.  His mental status includes appearance is casual, behavior is sharing and motivated, motor skills normal, speech and language is clear and coherent, affect and mood are both anxious with some depression, thought process is goal directed thought content includes some obsessional thoughts, well oriented to person/place/time/date/situation/day of week/month of year/year/and correctly stated date of today February 17, 2021.  His attention is good, concentration good/fair, reports some "limited memory concerns", insight is good/fair, judgment is good/fair, and impulse control is fair/poor.  He is scheduling for appointments into the future at this point and we will work on his mutually agreed upon goals as noted in his initial evaluation.  For further information regarding this patient please see the above sections of this evaluation.   Review of initial treatment plan and patient is in agreement.  Return appointment within 2 weeks.  This record has been created using [employer].  Chart creation errors have been sought, but may not always have been located and corrected.  Such creation errors do not reflect on the standard of medical care provided.    Shanon Ace, LCSW

## 2021-02-24 ENCOUNTER — Other Ambulatory Visit: Payer: Self-pay | Admitting: Gastroenterology

## 2021-02-27 NOTE — Telephone Encounter (Signed)
error 

## 2021-03-17 ENCOUNTER — Ambulatory Visit: Payer: BC Managed Care – PPO | Admitting: Psychiatry

## 2021-03-27 ENCOUNTER — Other Ambulatory Visit: Payer: Self-pay | Admitting: Internal Medicine

## 2021-04-21 ENCOUNTER — Other Ambulatory Visit: Payer: Self-pay

## 2021-04-21 ENCOUNTER — Ambulatory Visit (INDEPENDENT_AMBULATORY_CARE_PROVIDER_SITE_OTHER): Payer: BC Managed Care – PPO | Admitting: Psychiatry

## 2021-04-21 DIAGNOSIS — F422 Mixed obsessional thoughts and acts: Secondary | ICD-10-CM

## 2021-04-21 NOTE — Progress Notes (Signed)
[facility] Counselor/Therapist Progress Note  Patient ID: Jacob [name], MRN: [id number],    Date: 04/21/2021  Time Spent: 55 minutes   Treatment Type: Individual Therapy  Reported Symptoms: anxiety, unresolved grief over son's suicide and other family losses  Mental Status Exam:  Appearance:   Casual     Behavior:  Appropriate, Sharing, and Motivated  Motor:  Normal  Speech/Language:   Negative  Affect:  Anxious, some tearfulness  Mood:  anxious and sad  Thought process:  goal directed  Thought content:    Rumination and overthinking  Sensory/Perceptual disturbances:    WNL  Orientation:  oriented to person, place, time/date, situation, day of week, month of year, year, and stated date of Jan. 23, 2023  Attention:  Fair  Concentration:  Fair  Memory:  "Sometimes short memory forgetting but pretty good mostly"  Fund of knowledge:   Good  Insight:    Fair  Judgment:   Good and Fair  Impulse Control:  Good and Fair   Risk Assessment: Danger to Self:  No Self-injurious Behavior: No Danger to Others: No Duty to Warn:no Physical Aggression / Violence:No  Access to Firearms a concern: No  Gang Involvement:No   Subjective:  Patient today reporting "my thoughts go round and round in my head." But today is focusing more on the suicide of his 55 yr old son and "how my life has changed forever since his death." "It really tested my faith and I'm trying to stay strong." Processed today some of his feelings and thougths re: son's death. Punished myself for a long time. I don't typically cry and I do feel better after crying. "I got some of his ashes in a necklace." States  he is healing some and it helps him to talk this openly about my son and what all happened before and after."  "My thoughts go round and round in my head." States he is less obsessive when at work as he likes his job and concentrates well on it. Works as a Engineer, building services."  "I think ahead rather than focusing on  what I'm doing right now." Reports impulse control I still fair/poor and often do "stupid things especially in relationships." Difficult with multiple relationships going on at one time. Shares how son's death has changed him "and the fact we never know if we'll see tomorrow, it took me closer to God and I talk to God more than I ever have". Spoke more about "some of my stupid decisions" in relationships today. Able to process some of the behaviors he wants to change and to continue working on them between session. Reports he is "trying to limit his drinking more, not drinking every night and sometimes 2-3 glasses on weekends. " Reports depression is decreasing some as he works through the suicide of his son and also the loss of his girlfriend.   Interventions: Solution-Oriented/Positive Psychology and Insight-Oriented  Treatment goal plan:  Patient not signing treatment goal plan on computer screen due to [place]. Treatment goals: (relating to his resolution of grief re: son's suicide and other losses) Treatment goals remain on treatment plan as patient works with strategies to achieve his goals.  Progress is assessed each session and documented in the "progress" section of treatment note. Long-term goal: Reduce overall level, frequency, and intensity of the anxiety so that daily functioning is not impaired. Short-term goal: Verbalize an understanding of the role that anxious/fearful thoughts and fears play in excessive worry and  persistent anxiety symptoms. Strategies: Help the patient develop reality-based, positive cognitive messages.   Diagnosis:   ICD-10-CM   1. 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, building services."   "I am clumsy at times but do pretty good at work." He feels his attention is good (more at work) but  "I think ahead rather than focusing on what I'm doing right now."  Reports impulse control is " fair/poor" and end up doing "stupid things especially in relationships."  Described some of these behaviors and he is working to stop getting himself into relationships that are unhealthy, already feels he is doing better with impulsive behavior, and realizing that may be some relationships are not as good as he thought they were originally as he feels like he has made some changes and matured in certain ways after his son's suicide.  Encouraged patient and his practice of more positive behaviors including: Reflecting on any progress more often, for every negative thought create 2 positives, getting outside daily and walking, staying in touch with people who are supportive, remain on his prescribed medications, healthy nutrition and exercise, stop assuming worst case scenarios, practice consistent positive self talk, stop self negating, stop self blaming, practice more intentional listening to others who are helpful to him, reduce overthinking and over analyzing, challenge and counteract his self doubt, allow his faith to be an emotional support as well as spiritual, letting go of things including guilt from the past that holds him back as discussed in sessions, and recognize the strength he shows working with goal-directed behaviors to move in a direction that supports increased confidence and improved emotional health.  Goal review and progress/challenges noted with patient.  Next appt within 2-3 weeks.  This record has been created using [employer].  Chart creation errors have been sought, but may not always have been located and corrected.  Such creation errors do not reflect on the standard of medical care provided.   Shanon Ace, LCSW

## 2021-05-12 DIAGNOSIS — H6122 Impacted cerumen, left ear: Secondary | ICD-10-CM | POA: Insufficient documentation

## 2021-05-12 DIAGNOSIS — H6121 Impacted cerumen, right ear: Secondary | ICD-10-CM | POA: Insufficient documentation

## 2021-05-15 ENCOUNTER — Other Ambulatory Visit (HOSPITAL_COMMUNITY)
Admission: EM | Admit: 2021-05-15 | Discharge: 2021-05-17 | Disposition: A | Discharge status: Home / Self Care | Payer: BC Managed Care – PPO | Attending: Psychiatry | Admitting: Psychiatry

## 2021-05-15 ENCOUNTER — Telehealth: Payer: Self-pay | Admitting: Psychiatry

## 2021-05-15 DIAGNOSIS — F332 Major depressive disorder, recurrent severe without psychotic features: Secondary | ICD-10-CM | POA: Diagnosis not present

## 2021-05-15 DIAGNOSIS — Z818 Family history of other mental and behavioral disorders: Secondary | ICD-10-CM | POA: Insufficient documentation

## 2021-05-15 DIAGNOSIS — Z20822 Contact with and (suspected) exposure to covid-19: Secondary | ICD-10-CM | POA: Insufficient documentation

## 2021-05-15 DIAGNOSIS — F1010 Alcohol abuse, uncomplicated: Secondary | ICD-10-CM | POA: Insufficient documentation

## 2021-05-15 DIAGNOSIS — Z635 Disruption of family by separation and divorce: Secondary | ICD-10-CM | POA: Insufficient documentation

## 2021-05-15 DIAGNOSIS — Z634 Disappearance and death of family member: Secondary | ICD-10-CM | POA: Insufficient documentation

## 2021-05-15 DIAGNOSIS — F329 Major depressive disorder, single episode, unspecified: Secondary | ICD-10-CM | POA: Diagnosis present

## 2021-05-15 DIAGNOSIS — Z7984 Long term (current) use of oral hypoglycemic drugs: Secondary | ICD-10-CM | POA: Insufficient documentation

## 2021-05-15 DIAGNOSIS — Z79899 Other long term (current) drug therapy: Secondary | ICD-10-CM | POA: Insufficient documentation

## 2021-05-15 DIAGNOSIS — E785 Hyperlipidemia, unspecified: Secondary | ICD-10-CM | POA: Insufficient documentation

## 2021-05-15 DIAGNOSIS — F411 Generalized anxiety disorder: Secondary | ICD-10-CM | POA: Insufficient documentation

## 2021-05-15 DIAGNOSIS — R45851 Suicidal ideations: Secondary | ICD-10-CM | POA: Insufficient documentation

## 2021-05-15 DIAGNOSIS — F1910 Other psychoactive substance abuse, uncomplicated: Secondary | ICD-10-CM | POA: Insufficient documentation

## 2021-05-15 DIAGNOSIS — E119 Type 2 diabetes mellitus without complications: Secondary | ICD-10-CM | POA: Insufficient documentation

## 2021-05-15 LAB — POCT URINE DRUG SCREEN - MANUAL ENTRY (I-SCREEN)
POC Amphetamine UR: NOT DETECTED
POC Buprenorphine (BUP): NOT DETECTED
POC Cocaine UR: NOT DETECTED
POC Marijuana UR: NOT DETECTED
POC Methadone UR: NOT DETECTED
POC Methamphetamine UR: NOT DETECTED
POC Morphine: NOT DETECTED
POC Oxazepam (BZO): POSITIVE — AB
POC Oxycodone UR: NOT DETECTED
POC Secobarbital (BAR): NOT DETECTED

## 2021-05-15 LAB — CBC WITH DIFFERENTIAL/PLATELET
Abs Immature Granulocytes: 0.01 10*3/uL (ref 0.00–0.07)
Basophils Absolute: 0 10*3/uL (ref 0.0–0.1)
Basophils Relative: 0 %
Eosinophils Absolute: 0 10*3/uL (ref 0.0–0.5)
Eosinophils Relative: 1 %
HCT: 42.1 % (ref 39.0–52.0)
Hemoglobin: 14.3 g/dL (ref 13.0–17.0)
Immature Granulocytes: 0 %
Lymphocytes Relative: 32 %
Lymphs Abs: 1.7 10*3/uL (ref 0.7–4.0)
MCH: 29.9 pg (ref 26.0–34.0)
MCHC: 34 g/dL (ref 30.0–36.0)
MCV: 88.1 fL (ref 80.0–100.0)
Monocytes Absolute: 0.4 10*3/uL (ref 0.1–1.0)
Monocytes Relative: 7 %
Neutro Abs: 3.1 10*3/uL (ref 1.7–7.7)
Neutrophils Relative %: 60 %
Platelets: 239 10*3/uL (ref 150–400)
RBC: 4.78 MIL/uL (ref 4.22–5.81)
RDW: 12.5 % (ref 11.5–15.5)
WBC: 5.2 10*3/uL (ref 4.0–10.5)
nRBC: 0 % (ref 0.0–0.2)

## 2021-05-15 LAB — COMPREHENSIVE METABOLIC PANEL
ALT: 53 U/L — ABNORMAL HIGH (ref 0–44)
AST: 36 U/L (ref 15–41)
Albumin: 4.3 g/dL (ref 3.5–5.0)
Alkaline Phosphatase: 62 U/L (ref 38–126)
Anion gap: 9 (ref 5–15)
BUN: 9 mg/dL (ref 6–20)
CO2: 24 mmol/L (ref 22–32)
Calcium: 9.2 mg/dL (ref 8.9–10.3)
Chloride: 104 mmol/L (ref 98–111)
Creatinine, Ser: 1.17 mg/dL (ref 0.61–1.24)
GFR, Estimated: >60 mL/min (ref >60)
Glucose, Bld: 205 mg/dL — ABNORMAL HIGH (ref 70–99)
Potassium: 4.2 mmol/L (ref 3.5–5.1)
Sodium: 137 mmol/L (ref 135–145)
Total Bilirubin: 0.5 mg/dL (ref 0.3–1.2)
Total Protein: 6.9 g/dL (ref 6.5–8.1)

## 2021-05-15 LAB — LIPID PANEL
Cholesterol: 123 mg/dL (ref 0–200)
HDL: 46 mg/dL (ref >40)
LDL Cholesterol: 55 mg/dL (ref 0–99)
Total CHOL/HDL Ratio: 2.7 RATIO
Triglycerides: 110 mg/dL (ref <150)
VLDL: 22 mg/dL (ref 0–40)

## 2021-05-15 LAB — TSH: TSH: 1.84 u[IU]/mL (ref 0.350–4.500)

## 2021-05-15 LAB — RESP PANEL BY RT-PCR (FLU A&B, COVID) ARPGX2
Influenza A by PCR: NEGATIVE
Influenza B by PCR: NEGATIVE
SARS Coronavirus 2 by RT PCR: NEGATIVE

## 2021-05-15 LAB — ETHANOL: Alcohol, Ethyl (B): <10 mg/dL (ref <10)

## 2021-05-15 LAB — POC SARS CORONAVIRUS 2 AG -  ED: SARS Coronavirus 2 Ag: NEGATIVE

## 2021-05-15 MED ORDER — MAGNESIUM HYDROXIDE 400 MG/5ML PO SUSP: 30.0000 mL | Freq: Every day | ORAL | Status: DC | PRN

## 2021-05-15 MED ORDER — LORAZEPAM 1 MG PO TABS: 1.0000 mg | ORAL_TABLET | Freq: Four times a day (QID) | ORAL | Status: DC | PRN

## 2021-05-15 MED ORDER — THIAMINE HCL 100 MG/ML IJ SOLN
100.0000 mg | Freq: Once | INTRAMUSCULAR | Status: DC
  Filled 2021-05-15: qty 2

## 2021-05-15 MED ORDER — ONDANSETRON 4 MG PO TBDP: 4.0000 mg | ORAL_TABLET | Freq: Four times a day (QID) | ORAL | Status: DC | PRN

## 2021-05-15 MED ORDER — ASPIRIN EC 81 MG PO TBEC
81.0000 mg | DELAYED_RELEASE_TABLET | Freq: Every day | ORAL | Status: DC
  Administered 2021-05-16 – 2021-05-17 (×2): 81 mg via ORAL
  Filled 2021-05-15 (×3): qty 1

## 2021-05-15 MED ORDER — ALPRAZOLAM 0.5 MG PO TABS
0.5000 mg | ORAL_TABLET | Freq: Every evening | ORAL | Status: DC | PRN
  Administered 2021-05-15: 1 mg via ORAL
  Administered 2021-05-16: 0.5 mg via ORAL
  Filled 2021-05-15: qty 2
  Filled 2021-05-15: qty 1

## 2021-05-15 MED ORDER — HYDROXYZINE HCL 25 MG PO TABS: 25.0000 mg | ORAL_TABLET | Freq: Three times a day (TID) | ORAL | Status: DC | PRN

## 2021-05-15 MED ORDER — FLUOXETINE HCL 20 MG PO CAPS
40.0000 mg | ORAL_CAPSULE | Freq: Every day | ORAL | Status: DC
  Administered 2021-05-16: 40 mg via ORAL
  Filled 2021-05-15: qty 2

## 2021-05-15 MED ORDER — ADULT MULTIVITAMIN W/MINERALS CH
1.0000 | ORAL_TABLET | Freq: Every day | ORAL | Status: DC
  Filled 2021-05-15 (×3): qty 1

## 2021-05-15 MED ORDER — ALUM & MAG HYDROXIDE-SIMETH 200-200-20 MG/5ML PO SUSP: 30.0000 mL | ORAL | Status: DC | PRN

## 2021-05-15 MED ORDER — THIAMINE HCL 100 MG PO TABS
100.0000 mg | ORAL_TABLET | Freq: Every day | ORAL | Status: DC
  Filled 2021-05-15 (×2): qty 1

## 2021-05-15 MED ORDER — ACETAMINOPHEN 325 MG PO TABS: 650.0000 mg | ORAL_TABLET | Freq: Four times a day (QID) | ORAL | Status: DC | PRN

## 2021-05-15 MED ORDER — LOPERAMIDE HCL 2 MG PO CAPS: 2.0000 mg | ORAL_CAPSULE | ORAL | Status: DC | PRN

## 2021-05-15 MED ORDER — METFORMIN HCL ER 750 MG PO TB24
2000.0000 mg | ORAL_TABLET | Freq: Every day | ORAL | Status: DC
  Administered 2021-05-16 – 2021-05-17 (×2): 2000 mg via ORAL
  Filled 2021-05-15 (×2): qty 1

## 2021-05-15 NOTE — ED Notes (Addendum)
Pt sleeping at present, no distress noted.  Respirations, even & unlabored.  Monitoring for safety.

## 2021-05-15 NOTE — ED Notes (Addendum)
Pt A&O x 4, presents with suicidal ideations, stressors are loss of family members over the past year, including his son who passed by overdose.  Pt admits to drinking alcohol almost every day.  Comfort measures given.  No distress noted.  Monitoring for safety.

## 2021-05-15 NOTE — ED Notes (Addendum)
Meal was offered to pt he stated due to him being a picky eater nothing here suites his taste buds and also he only takes his medication with his meals. Pt is also concerned about if his insurance will pay for his stay. He will like to talk with a doctor

## 2021-05-15 NOTE — Telephone Encounter (Signed)
LVM to rtc 

## 2021-05-15 NOTE — Telephone Encounter (Signed)
Jacob [name] called because he is not doing well.  He has appt with [name] on Monday but he doesn't feel medications are helping him at all.  He is very depressed and needs something to help him.  He is feeling desperate and may go to the urgent care center.  Please call him to discuss medications

## 2021-05-15 NOTE — ED Provider Notes (Signed)
Behavioral Health Admission H&P [facility])  Date: 05/15/21 Patient Name: Jacob [name] MRN: [id number] Chief Complaint:  Chief Complaint  Patient presents with   Depression   Anxiety   Suicidal      Diagnoses:  Final diagnoses:  Severe episode of recurrent major depressive disorder, without psychotic features ([place])    HPI: Jacob [name] is a 56 year old male patient who presents to the [facility] [place] behavioral health urgent care as a walk-in voluntarily accompanied by his brother with a chief complaint of having thoughts of dying.  Patient seen and evaluated face-to-face by this provider, chart reviewed and case discussed with Dr. Serafina Mitchell. On evaluation, patient is alert and oriented x4. His thought process is logical and speech is clear and coherent. His mood is depressed and affect is congruent.  Patient states that he is at a point where he needs help. He states that he lost his 79 year old son last year from a drug overdose. He states that his grandmother passed away in 06-30-19. He states that he split from his ex fiance about 3 to 4 months ago.   He states that he continues to blame himself for his son's death. He states that he feels like there was more that he could have done. He reports feeling increasingly depressed since his son's death and describes his depressive symptoms as decreased appetite, difficulty sleeping, feelings of sadness, worthlessness, decreased energy level, and crying spells. He reports ongoing anxiety attacks and describes his anxiety as heart pounding and feeling dizzy, mostly at night.  He endorses passive suicidal ideations. He states that last Friday he drank 2 bottles of wine and felt like "I don't care if die." He states that today he started having some suicidal thoughts but stopped himself and decided to come here for treatment after he contacted [facility] for an appointment. He denies SI at this time. He denies HI. He denies AVH. There is no  objective evidence that the patient is currently responding to internal or external stimuli.  Patient reports drinking alcohol since age 65. He states, most recently he has been drinking a bottle of wine per day to self medicate. He states that his last alcoholic drink was last night. He denies a history of alcohol withdrawal symptoms, including alcohol withdrawal seizures and DTs. He denies using illicit drugs.  Patient states that he resides alone. 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 He reports that he receives outpatient psychiatric services with [facility]. He states that he is prescribed Wellbutrin, Prozac, and Xanax by Thayer Headings, PA. Patient reports a medical history of diabetes. He states that he is prescribed metformin 2000 mg by mouth daily for diabetes..    PHQ 2-9:  [place] Office Visit from 01/10/2021 in [place] at [facility] from 07/24/2020 in [place] at [facility] that you would be better off dead, or of hurting yourself in some way Not at all Not at all  PHQ-9 Total Score 4 7         Total Time spent with patient: 30 minutes  Musculoskeletal  Strength & Muscle Tone: within normal limits Gait & Station: normal Patient leans: N/A  Psychiatric Specialty Exam  Presentation General Appearance: Appropriate for Environment  Eye Contact:Fair  Speech:Clear and Coherent  Speech Volume:Normal  Handedness:No data recorded  Mood and Affect  Mood:Depressed  Affect:Congruent   Thought Process  Thought Processes:Coherent; Goal Directed  Descriptions of Associations:Intact  Orientation:Full (  Time, Place and Person)  Thought Content:WDL    Hallucinations:Hallucinations: None  Ideas of Reference:None  Suicidal Thoughts:Suicidal Thoughts: Yes, Passive  Homicidal Thoughts:Homicidal Thoughts: No   Sensorium  Memory:Immediate Fair; Recent Fair; Remote  Fair  Judgment:Fair  Insight:Fair   Executive Functions  Concentration:Fair  Attention Span:Fair  [place]   Psychomotor Activity  Psychomotor Activity:Psychomotor Activity: Normal   Assets  Assets:Communication Skills; Desire for Improvement; Housing; Catering manager; Leisure Time; Physical Health; Transportation   Sleep  Sleep:Sleep: Poor Number of Hours of Sleep: 5   No data recorded  Physical Exam Constitutional:      Appearance: Normal appearance.  HENT:     Head: Normocephalic and atraumatic.     Nose: Nose normal.  Eyes:     Conjunctiva/sclera: Conjunctivae normal.  Musculoskeletal:        General: Normal range of motion.  Neurological:     Mental Status: He is alert and oriented to person, place, and time.   Review of Systems  Constitutional: Negative.   HENT: Negative.    Gastrointestinal: Negative.   Genitourinary: Negative.   Musculoskeletal: Negative.   Skin: Negative.   Neurological: Negative.   Endo/Heme/Allergies: Negative.    Blood pressure 130/81, pulse 77, temperature 98.5 F (36.9 C), temperature source Oral, resp. rate 18, SpO2 98 %. There is no height or weight on file to calculate BMI.  Past Psychiatric History: Hx of MDD and GAD. One psychiatric hospitalization as a teen at [facility].   Is the patient at risk to self? Yes  Has the patient been a risk to self in the past 6 months? No .    Has the patient been a risk to self within the distant past? No   Is the patient a risk to others? No   Has the patient been a risk to others in the past 6 months? No   Has the patient been a risk to others within the distant past? No   Past Medical History:  Past Medical History:  Diagnosis Date   Allergic rhinitis    Anxiety    Arthritis    Cholesteatoma of right ear    Diabetes mellitus ([place])    bordreline   Dysuria    Enlarged prostate    pt unaware   GERD  (gastroesophageal reflux disease)    Headache    Hearing loss    Right ear   Hernia of abdominal wall    History of gross hematuria    History of head injury    History of kidney stones    History of palpitations    Hyperlipidemia 10/18/2014   Hypertension    Insomnia    Lack of concentration    Nasal fracture    Several   Otitis media 11/10/2017   Bilateral   Panic attacks    Syncope    Vertigo     Past Surgical History:  Procedure Laterality Date   CHOLECYSTECTOMY     CYSTOSCOPY N/A 05/10/2018   Procedure: CYSTOSCOPY WITH EXAM UNDER ANESTHESIA;  Surgeon: Ceasar Mons, MD;  Location: [facility];  Service: Urology;  Laterality: N/A;  ONLY NEEDS 30 MIN   HYPOSPADIAS CORRECTION N/A 1978   INNER EAR SURGERY     right   LUMBAR DISC SURGERY     MASTOIDECTOMY Right    NOSE SURGERY     Fracture   tubes in bil ears     UPPER GASTROINTESTINAL ENDOSCOPY  UPPER GI ENDOSCOPY  01/09/2012   Esophageal dilation    Family History:  Family History  Problem Relation Age of Onset   COPD Mother    Hypertension Mother    Anxiety disorder Mother    Heart disease Father    Cancer Father    Diabetes Paternal Grandmother    Diabetes Maternal Grandmother    Anxiety disorder Maternal Grandmother    Heart attack Brother    Lung cancer Maternal Uncle    Anxiety disorder Maternal Uncle    Anxiety disorder Maternal Aunt    OCD Maternal Aunt    Anxiety disorder Cousin    ADD / ADHD Son    Drug abuse Son    Colon cancer Neg Hx    Esophageal cancer Neg Hx    Rectal cancer Neg Hx    Stomach cancer Neg Hx     Social History:  Social History   Socioeconomic History   Marital status: Single    Spouse name: Not on file   Number of children: 3   Years of education: Not on file   Highest education level: Not on file  Occupational History   Occupation: Music therapist: [employer]  Tobacco Use   Smoking status: Never   Smokeless tobacco:  Never  Vaping Use   Vaping Use: Never used  Substance and Sexual Activity   Alcohol use: Yes    Alcohol/week: 5.0 standard drinks    Types: 5 Glasses of wine per week    Comment: weekends   Drug use: No   Sexual activity: Yes    Partners: Female    Birth control/protection: None  Other Topics Concern   Not on file  Social History Narrative   Not on file   Social Determinants of Health   Financial Resource Strain: Not on file  Food Insecurity: Not on file  Transportation Needs: Not on file  Physical Activity: Not on file  Stress: Not on file  Social Connections: Not on file  Intimate Partner Violence: Not on file    SDOH:  SDOH Screenings   Alcohol Screen: Not on file  Depression (PHQ2-9): Low Risk    PHQ-2 Score: 4  Financial Resource Strain: Not on file  Food Insecurity: Not on file  Housing: Not on file  Physical Activity: Not on file  Social Connections: Not on file  Stress: Not on file  Tobacco Use: Low Risk    Smoking Tobacco Use: Never   Smokeless Tobacco Use: Never   Passive Exposure: Not on file  Transportation Needs: Not on file    Last Labs:  No visits with results within 6 Month(s) from this visit.  Latest known visit with results is:  Office Visit on 10/22/2020  Component Date Value Ref Range Status   Hemoglobin A1C 10/22/2020 7.3 (A)  4.0 - 5.6 % Final    Allergies: Patient has no known allergies.  PTA Medications: (Not in a hospital admission)   Medical Decision Making  Patient admitted to the [facility] facility based crisis for mood stabilization and safety.  Lab Orders         Resp Panel by RT-PCR (Flu A&B, Covid) Nasopharyngeal Swab         CBC with Differential/Platelet         Comprehensive metabolic panel         Ethanol         Lipid panel         TSH  POCT Urine Drug Screen - ([facility])         POC SARS Coronavirus 2 Ag-ED - Nasal Swab    EKG   Meds ordered this encounter  Medications   acetaminophen (TYLENOL) tablet 650  mg   alum & mag hydroxide-simeth (MAALOX/MYLANTA) 200-200-20 MG/5ML suspension 30 mL   magnesium hydroxide (MILK OF MAGNESIA) suspension 30 mL   hydrOXYzine (ATARAX) tablet 25 mg   thiamine (B-1) injection 100 mg   thiamine tablet 100 mg   multivitamin with minerals tablet 1 tablet   LORazepam (ATIVAN) tablet 1 mg   loperamide (IMODIUM) capsule 2-4 mg   ondansetron (ZOFRAN-ODT) disintegrating tablet 4 mg   ALPRAZolam (XANAX) tablet 0.5-1 mg   aspirin EC tablet 81 mg   FLUoxetine (PROZAC) capsule 40 mg   metFORMIN (GLUCOPHAGE-XR) 24 hr tablet 2,000 mg      Recommendations  Based on my evaluation the patient does not appear to have an emergency medical condition.  Marissa Calamity, NP 05/15/21  5:52 PM

## 2021-05-15 NOTE — ED Notes (Signed)
Pt reports that he would like to sign himself out.  States that he feels safe enough to follow up with his doctor tomorrow.  Pt reports that he called his brother to pick him up.   Notice was put in the chat for the provider.

## 2021-05-15 NOTE — ED Notes (Signed)
Pt calm and cooperative. Pt trying to decide at this time if he wants to leave or not.

## 2021-05-15 NOTE — ED Notes (Signed)
Pt awake and alert. He was searched with no contaband found.  Skin assessment performed.  Labs and ekg competed.  Pt was oriented to unit and offered meal and fluids.   Belongings locked up in 29.  No distress noted at this time.

## 2021-05-15 NOTE — Progress Notes (Signed)
Patient states that he came to the [facility] today because he has been experiencing extreme anxiety and having thoughts of dying.  No previous suicide attempts and no current plans to kill himself.  Patient sttates that he has been struggling with depression for a long time and was on Prozac, but his PCP changed it to Wellbutrin.  Patient states that he was seeing a Therapist Clement Husbands at [facility]. Patient states that he has been crying for the past few weeks.  He states that in the past year that his son died from an overdose, his grandmother died and he and his girlfriend broke-up because he blamed her for his son's death because she would not let him come and live with them and he had nowhere to go after he got out of treatment.  Patient denies HI/Psychosis.  Patient states that he has not been sleeping or eating much.  Patient states that he has started drinking every night, 1 bottle of wine to self-medicate his emotions for the past year..  Patient states that he really is ready to get some help. Patient is urgent due to his potential need for detox.

## 2021-05-15 NOTE — ED Notes (Signed)
Pt just came to unit

## 2021-05-15 NOTE — Telephone Encounter (Signed)
Please offer pt apt tomorrow morning since it looks like there was a cancellation.

## 2021-05-15 NOTE — Telephone Encounter (Signed)
Pt was very upset and tearful.He stated he is not doing well and wants to feel like himself again.He is still dealing with the loss of his son and last friday he had some suicidal thoughts.He does not like the feelings he is having and thinks switching meds is "messing with his brain"He wanted to know where he could go to be committed or get immediate help and I encouraged him to visit [facility].He's worried about insurance not covering and I expressed to him how important it was to go there to get the help he needs and he agrees.

## 2021-05-16 ENCOUNTER — Ambulatory Visit: Payer: BC Managed Care – PPO | Admitting: Psychiatry

## 2021-05-16 MED ORDER — BUSPIRONE HCL 5 MG PO TABS
5.0000 mg | ORAL_TABLET | Freq: Two times a day (BID) | ORAL | Status: DC
  Administered 2021-05-16 – 2021-05-17 (×3): 5 mg via ORAL
  Filled 2021-05-16 (×3): qty 1

## 2021-05-16 MED ORDER — RAMIPRIL 2.5 MG PO CAPS
10.0000 mg | ORAL_CAPSULE | Freq: Every day | ORAL | Status: DC
  Administered 2021-05-16 – 2021-05-17 (×2): 10 mg via ORAL
  Filled 2021-05-16 (×2): qty 4

## 2021-05-16 MED ORDER — ROSUVASTATIN CALCIUM 5 MG PO TABS
10.0000 mg | ORAL_TABLET | Freq: Every day | ORAL | Status: DC
  Administered 2021-05-16 – 2021-05-17 (×2): 10 mg via ORAL
  Filled 2021-05-16 (×2): qty 1

## 2021-05-16 MED ORDER — BUPROPION HCL ER (XL) 150 MG PO TB24
150.0000 mg | ORAL_TABLET | Freq: Every day | ORAL | Status: DC
  Administered 2021-05-16 – 2021-05-17 (×2): 150 mg via ORAL
  Filled 2021-05-16 (×2): qty 1

## 2021-05-16 NOTE — ED Notes (Signed)
Patient refused to come to group, he said he did not want to come, gave him the paperwork for the group session, told him to read through it and fill out the worksheets.

## 2021-05-16 NOTE — ED Notes (Signed)
Pt sleeping at present, no distress noted.  Monitoring for safety. 

## 2021-05-16 NOTE — Progress Notes (Signed)
Jacob [name] woke up on his own, ate breakfast and returned to bed. Later he spoke with the pharmacist and was compliant with his medications. He received and made several calls.

## 2021-05-16 NOTE — BH Assessment (Signed)
Comprehensive Clinical Assessment (CCA) Note  05/16/2021 Jacob [name] [id number]  Chief Complaint:  Chief Complaint  Patient presents with   Depression   Anxiety   Suicidal   Visit Diagnosis: Depression, Anxiety, Suicidal  Per HPI: 56 year old male patient who presents to the [facility] behavioral health urgent care as a walk-in voluntarily accompanied by his brother with a chief complaint of having thoughts of dying.   Patient seen and evaluated face-to-face by this provider, chart reviewed and case discussed with Dr. Serafina Mitchell. On evaluation, patient is alert and oriented x4. His thought process is logical and speech is clear and coherent. His mood is depressed and affect is congruent.   Patient states that he is at a point where he needs help. He states that he lost his 46 year old son last year from a drug overdose. He states that his grandmother passed away in Jun 25, 2019. He states that he split from his ex fiance about 3 to 4 months ago.    He states that he continues to blame himself for his son's death. He states that he feels like there was more that he could have done. He reports feeling increasingly depressed since his son's death and describes his depressive symptoms as decreased appetite, difficulty sleeping, feelings of sadness, worthlessness, decreased energy level, and crying spells. He reports ongoing anxiety attacks and describes his anxiety as heart pounding and feeling dizzy, mostly at night.   He endorses passive suicidal ideations. He states that last Friday he drank 2 bottles of wine and felt like "I don't care if die." He states that today he started having some suicidal thoughts but stopped himself and decided to come here for treatment after he contacted [facility] for an appointment. He denies SI at this time. He denies HI. He denies AVH. There is no objective evidence that the patient is currently responding to internal or external stimuli.   Patient reports  drinking alcohol since age 34. He states, most recently he has been drinking a bottle of wine per day to self medicate. He states that his last alcoholic drink was last night. He denies a history of alcohol withdrawal symptoms, including alcohol withdrawal seizures and DTs. He denies using illicit drugs.   Patient states that he resides alone. 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 He reports that he receives outpatient psychiatric services with [facility]. He states that he is prescribed Wellbutrin, Prozac, and Xanax by Thayer Headings, PA. Patient reports a medical history of diabetes. He states that he is prescribed metformin 2000 mg by mouth daily for diabetes.   CCA Screening, Triage and Referral (STR)  Patient Reported Information How did you hear about [place]? No data recorded What Is the Reason for Your Visit/Call Today? Patient states that he came to the [facility] today because he has been experiencing extreme anxiety and having thoughts of dying.  No previous suicide attempts and no current plans to kill himself.  Patient sttates that he has been struggling with depression for a long time and was on Prozac, but his PCP changed it to Wellbutrin.  Patient states that he was seeing a Therapist Clement Husbands at [facility]. Patient states that he has been crying for the past few weeks.  He states that in the past year that his son died from an overdose, his grandmother died and he and his girlfriend broke-up because he blamed her for his son's death because she would not let him come and live with them and  he had nowhere to go after he got out of treatment.  Patient denies HI/Psychosis.  Patient states that he has not been sleeping or eating much.  Patient states that he has started drinking every night, 1 bottle of wine to self-medicate his emotions for the past year..  Patient states that he really is ready to get some help. Patient is urgent due to his potential need for detox.  How  Long Has This Been Causing You Problems? > than 6 months  What Do You Feel Would Help You the Most Today? Treatment for Depression or other mood problem; Alcohol or Drug Use Treatment   Have You Recently Had Any Thoughts About Hurting Yourself? Yes  Are You Planning to Commit Suicide/Harm Yourself At This time? No   Have you Recently Had Thoughts About [place]? No  Are You Planning to Harm Someone at This Time? No  Explanation: No data recorded  Have You Used Any Alcohol or Drugs in the Past 24 Hours? Yes  How Long Ago Did You Use Drugs or Alcohol? No data recorded What Did You Use and How Much? 3/4 bottle of wine   Do You Currently Have a Therapist/Psychiatrist? No data recorded Name of Therapist/Psychiatrist: No data recorded  Have You Been Recently Discharged From Any Office Practice or Programs? No data recorded Explanation of Discharge From Practice/Program: No data recorded    CCA Screening Triage Referral Assessment Type of Contact: No data recorded Telemedicine Service Delivery:   Is this Initial or Reassessment? No data recorded Date Telepsych consult ordered in [facility]:  No data recorded Time Telepsych consult ordered in [facility]:  No data recorded Location of Assessment: No data recorded Provider Location: No data recorded  Collateral Involvement: No data recorded  Does Patient Have a [place]? No data recorded Name and Contact of Legal Guardian: No data recorded If Minor and Not Living with Parent(s), Who has Custody? No data recorded Is CPS involved or ever been involved? No data recorded Is APS involved or ever been involved? No data recorded  Patient Determined To Be At Risk for Harm To Self or Others Based on Review of Patient Reported Information or Presenting Complaint? No data recorded Method: No data recorded Availability of Means: No data recorded Intent: No data recorded Notification Required: No data  recorded Additional Information for Danger to Others Potential: No data recorded Additional Comments for Danger to Others Potential: No data recorded Are There Guns or Other Weapons in Your Home? No data recorded Types of Guns/Weapons: No data recorded Are These Weapons Safely Secured?                            No data recorded Who Could Verify You Are Able To Have These Secured: No data recorded Do You Have any Outstanding Charges, Pending Court Dates, Parole/Probation? No data recorded Contacted To Inform of Risk of Harm To Self or Others: No data recorded   Does Patient Present under Involuntary Commitment? No data recorded IVC Papers Initial File Date: No data recorded  [place] of Residence: No data recorded  Patient Currently Receiving the Following Services: No data recorded  Determination of Need: Urgent (48 hours)   Options For Referral: Inpatient Hospitalization; Facility-Based Crisis     CCA Biopsychosocial Patient Reported Schizophrenia/Schizoaffective Diagnosis in Past: No   Strengths: No data recorded  Mental Health Symptoms Depression:   Hopelessness   Duration of Depressive symptoms:  Duration of Depressive Symptoms: Greater than two weeks   Mania:   None   Anxiety:    Restlessness; Worrying; Irritability   Psychosis:   None   Duration of Psychotic symptoms:    Trauma:   None   Obsessions:   None   Compulsions:   None   Inattention:   None   Hyperactivity/Impulsivity:   None   Oppositional/Defiant Behaviors:   None   Emotional Irregularity:   None   Other Mood/Personality Symptoms:  No data recorded   Mental Status Exam Appearance and self-care  Stature:   Average   Weight:   Average weight   Clothing:   Casual   Grooming:   Normal   Cosmetic use:   None   Posture/gait:   Normal   Motor activity:   Not Remarkable   Sensorium  Attention:   Normal   Concentration:   Normal   Orientation:   Object;  Person; Place; Situation; Time; X5   Recall/memory:   Normal   Affect and Mood  Affect:   Depressed   Mood:   Dysphoric; Depressed   Relating  Eye contact:   Normal   Facial expression:   Anxious; Depressed   Attitude toward examiner:   Cooperative   Thought and Language  Speech flow:  Normal   Thought content:   Appropriate to Mood and Circumstances   Preoccupation:   None   Hallucinations:   None   Organization:  No data recorded  [employer] of Knowledge:   Good   Intelligence:   Average   Abstraction:   Normal   Judgement:   Normal   Reality Testing:   Adequate   Insight:   Good   Decision Making:   Normal   Social Functioning  Social Maturity:   Responsible   Social Judgement:   Normal   Stress  Stressors:   Grief/losses   Coping Ability:   Normal   Skill Deficits:   None   Supports:   Family     Religion: Religion/Spirituality Are You A Religious Person?: Yes What is Your Religious Affiliation?: Christian How Might This Affect Treatment?: None identified  Leisure/Recreation: Leisure / Recreation Do You Have Hobbies?: No  Exercise/Diet: Exercise/Diet Do You Exercise?: No Have You Gained or Lost A Significant Amount of Weight in the Past Six Months?: No Do You Follow a Special Diet?: No Do You Have Any Trouble Sleeping?: Yes Explanation of Sleeping Difficulties: Patient reports not being able to sleep well due to increasing anxiety symptoms.   CCA Employment/Education Employment/Work Situation: Employment / Work Situation Employment Situation: Employed Work Stressors: Denied having any current stressors Patient's Job has Been Impacted by Current Illness: No Has Patient ever Been in Passenger transport manager?: No  Education: Education Is Patient Currently Attending School?: No Last Grade Completed: 12 Did You Have An Individualized Education Program (IIEP): No Did You Have Any Difficulty At [employer]?:  No Patient's Education Has Been Impacted by Current Illness: No   CCA Family/Childhood History Family and Relationship History: Family history Marital status: Single Does patient have children?: Yes How many children?: 2 How is patient's relationship with their children?: Reports having a good relationship with his two adult children  Childhood History:  Childhood History By whom was/is the patient raised?: Mother, Grandparents Did patient suffer any verbal/emotional/physical/sexual abuse as a child?: No Did patient suffer from severe childhood neglect?: No Has patient ever been sexually abused/assaulted/raped as an adolescent or adult?: No Was  the patient ever a victim of a crime or a disaster?: No Witnessed domestic violence?: No Has patient been affected by domestic violence as an adult?: No  Child/Adolescent Assessment:     CCA Substance Use Alcohol/Drug Use: Alcohol / Drug Use Pain Medications: None Prescriptions: Please see MAR Over the Counter: None History of alcohol / drug use?: Yes Longest period of sobriety (when/how long): Unknown Withdrawal Symptoms: None Substance #1 Name of Substance 1: Alcohol 1 - Age of First Use: Unknown 1 - Amount (size/oz): 3 glasses of wine 1 - Frequency: Intermittently 1 - Duration: "For many years" 1 - Last Use / Amount: 05/14/21 1 - Method of Aquiring: Purchasing 1- Route of Use: Oral                       ASAM's:  Six Dimensions of Multidimensional Assessment  Dimension 1:  Acute Intoxication and/or Withdrawal Potential:      Dimension 2:  Biomedical Conditions and Complications:      Dimension 3:  Emotional, Behavioral, or Cognitive Conditions and Complications:     Dimension 4:  Readiness to Change:     Dimension 5:  Relapse, Continued use, or Continued Problem Potential:     Dimension 6:  Recovery/Living Environment:     ASAM Severity Score:    ASAM Recommended Level of Treatment: ASAM Recommended Level of  Treatment: Level I Outpatient Treatment   Substance use Disorder (SUD)    Recommendations for Services/Supports/Treatments: Recommendations for Services/Supports/Treatments Recommendations For Services/Supports/Treatments: Medication Management, SAIOP (Substance Abuse Intensive Outpatient Program)  Discharge Disposition: Discharge Disposition Medical Exam completed: Yes Disposition of Patient: Admit Mode of transportation if patient is discharged/movement?: Car  DSM5 Diagnoses: Patient Active Problem List   Diagnosis Date Noted   MDD (major depressive disorder) 05/15/2021   Insomnia 07/24/2020   Grief 06/02/2020   Right rotator cuff tear 10/27/2019   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   Sleep disorder 08/23/2018   Chronic dysfunction of both eustachian tubes 08/15/2018   Memory loss 08/11/2018   Dysuria 04/22/2018   Gross hematuria 04/22/2018   STD exposure 04/22/2018   Lack of concentration 12/21/2017   Panic attacks 12/21/2017   Obsessional thoughts 12/21/2017   Acute sinus infection 06/11/2017   Screening for colorectal cancer 04/09/2017   Nausea without vomiting 04/09/2017   Dysphagia 04/09/2017   Dyspepsia 11/19/2016   Right otitis media 09/09/2016   Vertigo 09/09/2016   Hearing loss, right 09/09/2016   BPH (benign prostatic hyperplasia) 05/30/2016   Right shoulder pain 05/30/2016   Acute midline low back pain with sciatica 04/21/2016   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 04/21/2016   Urinary frequency 02/05/2016   Syncope 09/27/2015   Left lateral epicondylitis 08/31/2015   Allergic rhinitis 08/29/2015   Hearing loss 06/05/2015   Hypersomnolence 06/05/2015   Chest pain 01/23/2015   Chronic pain syndrome 01/23/2015   Hyperlipidemia 10/18/2014   Impingement syndrome of right shoulder 06/28/2014   Hip flexor tendon tightness 06/28/2014   Right knee pain 06/28/2014   Depression with anxiety 01/30/2013   Diabetes mellitus  without complication ([place]) 76/16/0737   Encounter for well adult exam with abnormal findings 01/11/2013   Essential hypertension, benign 01/11/2013   Low back pain syndrome 01/11/2013   DJD (degenerative joint disease) 01/11/2013   Erectile dysfunction 01/11/2013   Gastroesophageal reflux disease 12/30/2011     Referrals to Alternative Service(s): Referred to Alternative Service(s):   Place:   Date:  Time:    Referred to Alternative Service(s):   Place:   Date:   Time:    Referred to Alternative Service(s):   Place:   Date:   Time:    Referred to Alternative Service(s):   Place:   Date:   Time:     Marylee Floras, LCSW

## 2021-05-16 NOTE — ED Notes (Signed)
Alert and oriented x 4 , verbally spontaneous , thought process organized , reports some feelings of depression but states it is much decreased , denies SI / HI or AVH .Will continue to monitor for safety

## 2021-05-16 NOTE — ED Notes (Signed)
Patient expressed to staff in group that his goal is to get over the self guilt from his son passing away. Patient communicated that he feels like it was his fault that his son died, and he should have been there. Patient also stated that he is also grieving the lost of his girlfriend, and it seems like hid depression is getting worst. His goal is to find new coping skills to utilize when feelings of depression and self guilt come along. Staff commended patient participating in wrap up group and sharing his thoughts.

## 2021-05-16 NOTE — Progress Notes (Signed)
Reeder was OOB for lunch and received a second dose of medication.

## 2021-05-16 NOTE — ED Notes (Signed)
Appears to be resting quietly with eyes closed , respirations even and unlabored , no distress noted. Will continue to monitor safety.

## 2021-05-16 NOTE — ED Provider Notes (Signed)
Behavioral Health Progress Note  Date and Time: 05/16/2021 12:04 PM Name: Jacob [name] MRN:  [id number]  Subjective:    Jacob [name] is a 56 year old male with history of anxiety and depression  who presents to the [facility] behavioral health urgent care as a walk-in voluntarily accompanied by his brother with a chief complaint of having thoughts of dying in the context of numerous stressors- son passing away ~1 year ago, grandmother recently passing, and termination of relationship with fiance several months ago. UDS+bzd; etoh neg.  Patient seen and chart reviewed-patient interviewed in conjunction with LCSW this morning.  Patient recounts what led to her presenting to the [facility] yesterday as per H&P.  Patient states his mood is "a little bit better than yesterday".  Patient recounts recent stressors including the loss of his son approximately a year ago, the passing of his grandmother who is more like his mother and the termination of the relationship with his fiance several months ago.  Patient states that over the last year he has increased his alcohol intake.  He states he will drink anywhere from 3 glasses of wine a day to a bottle of wine although stated that this past Friday he had "a bad night".  He states he drank 2 bottles of wine.  He reports passive SI although denies HI/AVH.  Patient discusses his medications-he states that he had previously taken Prozac although stopped it due to sexual side effects and that he has been taking Wellbutrin "on and off".  He expresses desire to restart Wellbutrin and to stop Prozac.  Patient also expresses interest in medication to assist with anxiety.  Discussed starting BuSpar-R/B/SE/AT and patient is agreeable to starting 5 mg twice daily.  Patient reports previous psychiatric medications as Prozac, Wellbutrin, Xanax, Paxil.  He states that Paxil over sexual side effects.  He denies previous psychiatric hospitalizations and denies previous suicide  attempts.  He states that he sees a provider [facility] for medication management.  He states that he has 2 children and 3 grandchildren.  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  He denies recreational drug use and tobacco use.  He denies rehab history.  He states that even though he has increased his alcohol intake the past year he is not currently interested in rehab services.  He reports a family psychiatric history of anxiety in multiple family members.  Patient states that he would like to get restarted on Wellbutrin and BuSpar and to be observed throughout the day for side effects and expresses that he would likely be interested in discharging tomorrow.  Diagnosis:  Final diagnoses:  Severe episode of recurrent major depressive disorder, without psychotic features ([place])    Total Time spent with patient: 30 minutes  Past Psychiatric History: anxiety, depression Past Medical History:  Past Medical History:  Diagnosis Date   Allergic rhinitis    Anxiety    Arthritis    Cholesteatoma of right ear    Diabetes mellitus ([place])    bordreline   Dysuria    Enlarged prostate    pt unaware   GERD (gastroesophageal reflux disease)    Headache    Hearing loss    Right ear   Hernia of abdominal wall    History of gross hematuria    History of head injury    History of kidney stones    History of palpitations    Hyperlipidemia 10/18/2014   Hypertension  Insomnia    Lack of concentration    Nasal fracture    Several   Otitis media 11/10/2017   Bilateral   Panic attacks    Syncope    Vertigo     Past Surgical History:  Procedure Laterality Date   CHOLECYSTECTOMY     CYSTOSCOPY N/A 05/10/2018   Procedure: CYSTOSCOPY WITH EXAM UNDER ANESTHESIA;  Surgeon: Ceasar Mons, MD;  Location: [facility];  Service: Urology;  Laterality: N/A;  ONLY NEEDS 30 MIN   HYPOSPADIAS CORRECTION N/A 1978   INNER EAR SURGERY     right   LUMBAR  DISC SURGERY     MASTOIDECTOMY Right    NOSE SURGERY     Fracture   tubes in bil ears     UPPER GASTROINTESTINAL ENDOSCOPY     UPPER GI ENDOSCOPY  01/09/2012   Esophageal dilation   Family History:  Family History  Problem Relation Age of Onset   COPD Mother    Hypertension Mother    Anxiety disorder Mother    Heart disease Father    Cancer Father    Diabetes Paternal Grandmother    Diabetes Maternal Grandmother    Anxiety disorder Maternal Grandmother    Heart attack Brother    Lung cancer Maternal Uncle    Anxiety disorder Maternal Uncle    Anxiety disorder Maternal Aunt    OCD Maternal Aunt    Anxiety disorder Cousin    ADD / ADHD Son    Drug abuse Son    Colon cancer Neg Hx    Esophageal cancer Neg Hx    Rectal cancer Neg Hx    Stomach cancer Neg Hx    Family Psychiatric  History: anxiety in multiple family members Social History:  Social History   Substance and Sexual Activity  Alcohol Use Yes   Alcohol/week: 5.0 standard drinks   Types: 5 Glasses of wine per week   Comment: weekends     Social History   Substance and Sexual Activity  Drug Use No    Social History   Socioeconomic History   Marital status: Single    Spouse name: Not on file   Number of children: 3   Years of education: Not on file   Highest education level: Not on file  Occupational History   Occupation: Music therapist: [employer]  Tobacco Use   Smoking status: Never   Smokeless tobacco: Never  Vaping Use   Vaping Use: Never used  Substance and Sexual Activity   Alcohol use: Yes    Alcohol/week: 5.0 standard drinks    Types: 5 Glasses of wine per week    Comment: weekends   Drug use: No   Sexual activity: Yes    Partners: Female    Birth control/protection: None  Other Topics Concern   Not on file  Social History Narrative   Not on file   Social Determinants of Health   Financial Resource Strain: Not on file  Food Insecurity: Not on file  Transportation  Needs: Not on file  Physical Activity: Not on file  Stress: Not on file  Social Connections: Not on file   SDOH:  SDOH Screenings   Alcohol Screen: Not on file  Depression (PHQ2-9): Low Risk    PHQ-2 Score: 4  Financial Resource Strain: Not on file  Food Insecurity: Not on file  Housing: Not on file  Physical Activity: Not on file  Social Connections: Not on file  Stress: Not on file  Tobacco Use: Low Risk    Smoking Tobacco Use: Never   Smokeless Tobacco Use: Never   Passive Exposure: Not on file  Transportation Needs: Not on file   Additional Social History:                         Sleep: Fair  Appetite:  Fair  Current Medications:  Current Facility-Administered Medications  Medication Dose Route Frequency Provider Last Rate Last Admin   acetaminophen (TYLENOL) tablet 650 mg  650 mg Oral Q6H PRN White, Patrice L, NP       ALPRAZolam Duanne Moron) tablet 0.5-1 mg  0.5-1 mg Oral QHS PRN White, Patrice L, NP   1 mg at 05/15/21 2154   alum & mag hydroxide-simeth (MAALOX/MYLANTA) 200-200-20 MG/5ML suspension 30 mL  30 mL Oral Q4H PRN White, Patrice L, NP       aspirin EC tablet 81 mg  81 mg Oral Daily White, Patrice L, NP   81 mg at 05/16/21 9629   buPROPion (WELLBUTRIN XL) 24 hr tablet 150 mg  150 mg Oral Daily Ival Bible, MD       busPIRone (BUSPAR) tablet 5 mg  5 mg Oral BID Ival Bible, MD       hydrOXYzine (ATARAX) tablet 25 mg  25 mg Oral TID PRN White, Patrice L, NP       loperamide (IMODIUM) capsule 2-4 mg  2-4 mg Oral PRN White, Patrice L, NP       LORazepam (ATIVAN) tablet 1 mg  1 mg Oral Q6H PRN White, Patrice L, NP       magnesium hydroxide (MILK OF MAGNESIA) suspension 30 mL  30 mL Oral Daily PRN White, Patrice L, NP       metFORMIN (GLUCOPHAGE-XR) 24 hr tablet 2,000 mg  2,000 mg Oral Q breakfast White, Patrice L, NP   2,000 mg at 05/16/21 5284   multivitamin with minerals tablet 1 tablet  1 tablet Oral Daily White, Patrice L, NP        ondansetron (ZOFRAN-ODT) disintegrating tablet 4 mg  4 mg Oral Q6H PRN White, Patrice L, NP       ramipril (ALTACE) capsule 10 mg  10 mg Oral Daily Ival Bible, MD       rosuvastatin (CRESTOR) tablet 10 mg  10 mg Oral Daily Ival Bible, MD       thiamine (B-1) injection 100 mg  100 mg Intramuscular Once White, Patrice L, NP       thiamine tablet 100 mg  100 mg Oral Daily White, Patrice L, NP       Current Outpatient Medications  Medication Sig Dispense Refill   albuterol (VENTOLIN HFA) 108 (90 Base) MCG/ACT inhaler Inhale 2 puffs into the lungs every 4 (four) hours as needed for wheezing.     ALPRAZolam (XANAX) 0.5 MG tablet TAKE 1 TO 2 TABLETS BY MOUTH AT BEDTIME AS NEEDED 60 tablet 2   Ascorbic Acid (VITAMIN C PO) Take 500 mg by mouth daily.     aspirin EC 81 MG tablet Take 1 tablet (81 mg total) by mouth daily. 90 tablet 11   buPROPion (WELLBUTRIN SR) 150 MG 12 hr tablet Take 150 mg by mouth daily.     FLUoxetine (PROZAC) 40 MG capsule Take 1 capsule (40 mg total) by mouth daily. 90 capsule 1   meclizine (ANTIVERT) 25 MG tablet Take 25 mg by mouth 2 (  two) times daily as needed for dizziness.     metFORMIN (GLUCOPHAGE-XR) 500 MG 24 hr tablet Take 4 tablets (2,000 mg total) by mouth daily with breakfast. 360 tablet 3   Multiple Vitamin (MULTIVITAMIN WITH MINERALS) TABS tablet Take 1 tablet by mouth daily.     Omega-3 Fatty Acids (FISH OIL PO) Take 1 capsule by mouth daily.     pantoprazole (PROTONIX) 40 MG tablet TAKE 1 TABLET BY MOUTH TWICE A DAY (Patient taking differently: Takes both tablets in the morning (80 mg)) 180 tablet 1   ramipril (ALTACE) 10 MG capsule Take 1 capsule (10 mg total) by mouth daily. Annual appt is due in must see provider for future refills 90 capsule 3   rosuvastatin (CRESTOR) 10 MG tablet Take 1 tablet (10 mg total) by mouth daily. 90 tablet 3   tadalafil (CIALIS) 20 MG tablet TAKE 1 TABLET BY MOUTH EVERY DAY AS NEEDED 8 tablet 7   triamcinolone  (NASACORT) 55 MCG/ACT AERO nasal inhaler Place 2 sprays into the nose daily. (Patient taking differently: Place 2 sprays into the nose daily as needed.) 3 Inhaler 3    Labs  Lab Results:  Admission on 05/15/2021  Component Date Value Ref Range Status   SARS Coronavirus 2 by RT PCR 05/15/2021 NEGATIVE  NEGATIVE Final   Comment: (NOTE) SARS-CoV-2 target nucleic acids are NOT DETECTED.  The SARS-CoV-2 RNA is generally detectable in upper respiratory specimens during the acute phase of infection. The lowest concentration of SARS-CoV-2 viral copies this assay can detect is 138 copies/mL. A negative result does not preclude SARS-Cov-2 infection and should not be used as the sole basis for treatment or other patient management decisions. A negative result may occur with  improper specimen collection/handling, submission of specimen other than nasopharyngeal swab, presence of viral mutation(s) within the areas targeted by this assay, and inadequate number of viral copies(<138 copies/mL). A negative result must be combined with clinical observations, patient history, and epidemiological information. The expected result is Negative.  Fact Sheet for Patients:  EntrepreneurPulse.com.au  Fact Sheet for Healthcare Providers:  IncredibleEmployment.be  This test is no                          t yet approved or cleared by the [place] FDA and  has been authorized for detection and/or diagnosis of SARS-CoV-2 by FDA under an Emergency Use Authorization (EUA). This EUA will remain  in effect (meaning this test can be used) for the duration of the COVID-19 declaration under Section 564(b)(1) of the Act, 21 U.S.C.section 360bbb-3(b)(1), unless the authorization is terminated  or revoked sooner.       Influenza A by PCR 05/15/2021 NEGATIVE  NEGATIVE Final   Influenza B by PCR 05/15/2021 NEGATIVE  NEGATIVE Final   Comment: (NOTE) The Xpert Xpress  SARS-CoV-2/FLU/RSV plus assay is intended as an aid in the diagnosis of influenza from Nasopharyngeal swab specimens and should not be used as a sole basis for treatment. Nasal washings and aspirates are unacceptable for Xpert Xpress SARS-CoV-2/FLU/RSV testing.  Fact Sheet for Patients: EntrepreneurPulse.com.au  Fact Sheet for Healthcare Providers: IncredibleEmployment.be  This test is not yet approved or cleared by the [place] FDA and has been authorized for detection and/or diagnosis of SARS-CoV-2 by FDA under an Emergency Use Authorization (EUA). This EUA will remain in effect (meaning this test can be used) for the duration of the COVID-19 declaration under Section 564(b)(1) of the Act,  21 U.S.C. section 360bbb-3(b)(1), unless the authorization is terminated or revoked.  Performed at [facility], [place] [address], [place], [place] [address]    WBC 05/15/2021 5.2  4.0 - 10.5 K/uL Final   RBC 05/15/2021 4.78  4.22 - 5.81 MIL/uL Final   Hemoglobin 05/15/2021 14.3  13.0 - 17.0 g/dL Final   HCT 05/15/2021 42.1  39.0 - 52.0 % Final   MCV 05/15/2021 88.1  80.0 - 100.0 fL Final   MCH 05/15/2021 29.9  26.0 - 34.0 pg Final   MCHC 05/15/2021 34.0  30.0 - 36.0 g/dL Final   RDW 05/15/2021 12.5  11.5 - 15.5 % Final   Platelets 05/15/2021 239  150 - 400 K/uL Final   nRBC 05/15/2021 0.0  0.0 - 0.2 % Final   Neutrophils Relative % 05/15/2021 60  % Final   Neutro Abs 05/15/2021 3.1  1.7 - 7.7 K/uL Final   Lymphocytes Relative 05/15/2021 32  % Final   Lymphs Abs 05/15/2021 1.7  0.7 - 4.0 K/uL Final   Monocytes Relative 05/15/2021 7  % Final   Monocytes Absolute 05/15/2021 0.4  0.1 - 1.0 K/uL Final   Eosinophils Relative 05/15/2021 1  % Final   Eosinophils Absolute 05/15/2021 0.0  0.0 - 0.5 K/uL Final   Basophils Relative 05/15/2021 0  % Final   Basophils Absolute 05/15/2021 0.0  0.0 - 0.1 K/uL Final   Immature Granulocytes 05/15/2021 0  %  Final   Abs Immature Granulocytes 05/15/2021 0.01  0.00 - 0.07 K/uL Final   Performed at [facility], [place] [address] [place], [place] [address]   Sodium 05/15/2021 137  135 - 145 mmol/L Final   Potassium 05/15/2021 4.2  3.5 - 5.1 mmol/L Final   Chloride 05/15/2021 104  98 - 111 mmol/L Final   CO2 05/15/2021 24  22 - 32 mmol/L Final   Glucose, Bld 05/15/2021 205 (H)  70 - 99 mg/dL Final   Glucose reference range applies only to samples taken after fasting for at least 8 hours.   BUN 05/15/2021 9  6 - 20 mg/dL Final   Creatinine, Ser 05/15/2021 1.17  0.61 - 1.24 mg/dL Final   Calcium 05/15/2021 9.2  8.9 - 10.3 mg/dL Final   Total Protein 05/15/2021 6.9  6.5 - 8.1 g/dL Final   Albumin 05/15/2021 4.3  3.5 - 5.0 g/dL Final   AST 05/15/2021 36  15 - 41 U/L Final   ALT 05/15/2021 53 (H)  0 - 44 U/L Final   Alkaline Phosphatase 05/15/2021 62  38 - 126 U/L Final   Total Bilirubin 05/15/2021 0.5  0.3 - 1.2 mg/dL Final   GFR, Estimated 05/15/2021 >60  >60 mL/min Final   Comment: (NOTE) Calculated using the CKD-EPI Creatinine Equation (2021)    Anion gap 05/15/2021 9  5 - 15 Final   Performed at [facility], [place] [address], [place], [place] [address]   Alcohol, Ethyl (B) 05/15/2021 <10  <10 mg/dL Final   Comment: (NOTE) Lowest detectable limit for serum alcohol is 10 mg/dL.  For medical purposes only. Performed at [facility], [place] [address], [place], [place] [address]    Cholesterol 05/15/2021 123  0 - 200 mg/dL Final   Triglycerides 05/15/2021 110  <150 mg/dL Final   HDL 05/15/2021 46  >40 mg/dL Final   Total CHOL/HDL Ratio 05/15/2021 2.7  RATIO Final   VLDL 05/15/2021 22  0 - 40 mg/dL Final  LDL Cholesterol 05/15/2021 55  0 - 99 mg/dL Final   Comment:        Total Cholesterol/HDL:CHD Risk Coronary Heart Disease Risk Table                     Men   Women  1/2 Average Risk   3.4   3.3  Average Risk       5.0   4.4  2 X Average Risk   9.6   7.1  3 X Average  Risk  23.4   11.0        Use the calculated Patient Ratio above and the CHD Risk Table to determine the patient's CHD Risk.        ATP [name] CLASSIFICATION (LDL):  <100     mg/dL   Optimal  100-129  mg/dL   Near or Above                    Optimal  130-159  mg/dL   Borderline  160-189  mg/dL   High  >190     mg/dL   Very High Performed at [place] [address] [place], [place] [address]    TSH 05/15/2021 1.840  0.350 - 4.500 uIU/mL Final   Comment: Performed by a 3rd Generation assay with a functional sensitivity of <=0.01 uIU/mL. Performed at [facility], [place] [address] [place], [place] [address]    POC Amphetamine UR 05/15/2021 None Detected  NONE DETECTED (Cut Off Level 1000 ng/mL) Final   POC Secobarbital (BAR) 05/15/2021 None Detected  NONE DETECTED (Cut Off Level 300 ng/mL) Final   POC Buprenorphine (BUP) 05/15/2021 None Detected  NONE DETECTED (Cut Off Level 10 ng/mL) Final   POC Oxazepam (BZO) 05/15/2021 Positive (A)  NONE DETECTED (Cut Off Level 300 ng/mL) Final   POC Cocaine UR 05/15/2021 None Detected  NONE DETECTED (Cut Off Level 300 ng/mL) Final   POC Methamphetamine UR 05/15/2021 None Detected  NONE DETECTED (Cut Off Level 1000 ng/mL) Final   POC Morphine 05/15/2021 None Detected  NONE DETECTED (Cut Off Level 300 ng/mL) Final   POC Oxycodone UR 05/15/2021 None Detected  NONE DETECTED (Cut Off Level 100 ng/mL) Final   POC Methadone UR 05/15/2021 None Detected  NONE DETECTED (Cut Off Level 300 ng/mL) Final   POC Marijuana UR 05/15/2021 None Detected  NONE DETECTED (Cut Off Level 50 ng/mL) Final   SARS Coronavirus 2 Ag 05/15/2021 Negative  Negative Final    Blood Alcohol level:  Lab Results  Component Value Date   ETH <10 71/21/9758    Metabolic Disorder Labs: Lab Results  Component Value Date   HGBA1C 7.3 (A) 10/22/2020   No results found for: PROLACTIN Lab Results  Component Value Date   CHOL 123 05/15/2021   TRIG 110 05/15/2021   HDL  46 05/15/2021   CHOLHDL 2.7 05/15/2021   VLDL 22 05/15/2021   LDLCALC 55 05/15/2021   LDLCALC 59 06/17/2020    Therapeutic Lab Levels: No results found for: LITHIUM No results found for: VALPROATE No components found for:  CBMZ  Physical Findings   Mini-Mental    Flowsheet Row Office Visit from 12/16/2020 in [facility] Visit from 05/31/2018 in [facility]  Total Score (max 30 points ) 30 30      PHQ2-9    [place] Visit from 01/10/2021 in [place] at [facility] Visit from 07/24/2020 in  Therapist, music at [facility] Visit from 05/31/2020 in [place] at [facility] Visit from 10/08/2017 in [facility] Visit from 08/29/2015 in [facility]  PHQ-2 Total Score 1 3 0 0 0  PHQ-9 Total Score 4 7 -- -- --      Flowsheet Row ED from 05/15/2021 in [facility] High [facility]        Musculoskeletal  Strength & Muscle Tone: within normal limits Gait & Station: normal Patient leans: N/A  Psychiatric Specialty Exam  Presentation  General Appearance: Appropriate for Environment; Casual  Eye Contact:Fair  Speech:Clear and Coherent; Normal Rate  Speech Volume:Normal  Handedness:No data recorded  Mood and Affect  Mood:Dysphoric; Anxious  Affect:Appropriate; Congruent; Other (comment) (anxious)   Thought Process  Thought Processes:Coherent; Goal Directed; Linear  Descriptions of Associations:Intact  Orientation:Full (Time, Place and Person)  Thought Content:WDL; Logical     Hallucinations:Hallucinations: None  Ideas of Reference:None  Suicidal Thoughts:Suicidal Thoughts: No  Homicidal Thoughts:Homicidal Thoughts: No   Sensorium  Memory:Immediate Good; Recent Good; Remote Good  Judgment:Fair  Insight:Fair   Executive Functions  Concentration:Fair  Attention  Span:Fair  [place]   Psychomotor Activity  Psychomotor Activity:Psychomotor Activity: Normal   Assets  Assets:Communication Skills; Desire for Improvement; Housing; Catering manager; Leisure Time; Physical Health   Sleep  Sleep:Sleep: Fair Number of Hours of Sleep: 5   No data recorded  Physical Exam  Physical Exam Constitutional:      Appearance: Normal appearance. He is normal weight.  HENT:     Head: Normocephalic and atraumatic.  Eyes:     Extraocular Movements: Extraocular movements intact.  Pulmonary:     Effort: Pulmonary effort is normal.  Neurological:     General: No focal deficit present.     Mental Status: He is alert and oriented to person, place, and time.  Psychiatric:        Attention and Perception: Attention and perception normal.        Speech: Speech normal.        Behavior: Behavior normal. Behavior is cooperative.        Thought Content: Thought content normal.   Review of Systems  Constitutional:  Negative for chills and fever.  HENT:  Negative for hearing loss.   Eyes:  Negative for discharge and redness.  Respiratory:  Negative for cough.   Cardiovascular:  Negative for chest pain.  Gastrointestinal:  Negative for abdominal pain.  Musculoskeletal:  Negative for myalgias.  Neurological:  Negative for headaches.  Psychiatric/Behavioral:  Positive for depression and substance abuse. Negative for hallucinations.   Blood pressure 133/76, pulse 60, temperature 97.9 F (36.6 C), temperature source Tympanic, resp. rate 18, SpO2 99 %. There is no height or weight on file to calculate BMI.  Treatment Plan Summary:  Jacob [name] is a 56 year old male with history of anxiety and depression  who presents to the [facility] behavioral health urgent care as a walk-in voluntarily accompanied by his brother with a chief complaint of having thoughts of dying in the context of numerous  stressors- son passing away ~1 year ago, grandmother recently passing, and termination of relationship with fiance several months ago. Patient also reported escalating alcohol use over the last year since passing of his son. UDS+bzd; etoh neg.  Patient reports ongoing passive SI and high anxiety.  He is agreeable to restarting Wellbutrin 150  mg daily and trialing BuSpar 5 mg twice daily and discontinuing Prozac.  Patient remains appropriate for continued treatment on the [facility] for continued crisis stabilization. Most recent CIWA 2.  Anticipate discharge tomorrow as long as there are no safety concerns.  MDD Anxiety -stop prozac -start wellbutrin 150 mg daily -start buspar 5 mg BID - home prn xanax qhs  AUD -continue CIWA protocol  DM -continue home  glucophage 2000 mg daily - Contnue home ramipril  HLD -continue home crestor   Dispo: Anticipate dc tomorrow as long as there are no safety concerns. Has outpatient provider at [place], MD 05/16/2021 12:04 PM

## 2021-05-17 MED ORDER — BUSPIRONE HCL 5 MG PO TABS: 5.0000 mg | ORAL_TABLET | Freq: Two times a day (BID) | ORAL | 0 refills | Status: DC

## 2021-05-17 NOTE — Progress Notes (Signed)
Pt is wake, alert and oriented. Pt did not voice any complaints of pain or discomfort. No signs of acute distress noted. Administered scheduled meds with no incident. Pt denies current SI/HI/AVH. Staff will monitor for pt safety.

## 2021-05-17 NOTE — ED Provider Notes (Signed)
FBC/OBS ASAP Discharge Summary  Date and Time: 05/17/2021 10:44 AM  Name: Jacob [name]  MRN:  [id number]   Discharge Diagnoses:  Final diagnoses:  Severe episode of recurrent major depressive disorder, without psychotic features ([place])    Subjective: "I was told that they would let me out today if I tolerated the Wellbutrin and Buspar." Patient denies suicidal ideations. Patient denies homicidal ideations. Patient denies auditory and visual hallucinations. Patient denies depressive and anxiety symptoms. Patient states that he knows that he needs to cut down on drinking and feels like that contributed to how he was feeling. He states that he has his drinking under control and is not interested in substance abuse treatment.  Stay Summary: Jacob [name] is a 56 year old male with history of anxiety and depression  who presents to the [facility] behavioral health urgent care as a walk-in voluntarily accompanied by his brother with a chief complaint of having thoughts of dying in the context of numerous stressors- son passing away ~1 year ago, grandmother recently passing, and termination of relationship with fiance several months ago.   Patient was admitted to the facility based crisis unit for mood stabilization and safety.  Medication adjustments were made during this encounter. Prozac 40 mg was discontinued due to the patient reporting sexual side effects. Patient was restarted back on home medication Wellbutrin 150 mg by mouth daily for depression and anxiety. BuSpar 5 mg by mouth twice daily was initiated for anxiety. Patient has tolerated medications without any side effects. UDS+bzd; etoh neg.  Patient seen and evaluated face-to-face by this provider, chart reviewed and case discussed with Dr. Lovette Cliche. On evaluation, patient is alert and oriented x4. His thought process is logical and speech is clear and coherent.  His mood is euthymic and affect is congruent. Patient denies SI/HI/AVH.  There is no objective evidence that the patient is currently responding to internal or external stimuli. Patient has been observed on the unit without any aggressive, disruptive, self-harm, or psychotic behaviors. Patient states that he will follow up with [facility] for medication management and therapy. He states that he has a therapy appointment on Monday and will call to schedule an appointment with his psychiatrist next week.    Total Time spent with patient: 15 minutes  Past Psychiatric History: MDD and GAD Past Medical History:  Past Medical History:  Diagnosis Date   Allergic rhinitis    Anxiety    Arthritis    Cholesteatoma of right ear    Diabetes mellitus ([place])    bordreline   Dysuria    Enlarged prostate    pt unaware   GERD (gastroesophageal reflux disease)    Headache    Hearing loss    Right ear   Hernia of abdominal wall    History of gross hematuria    History of head injury    History of kidney stones    History of palpitations    Hyperlipidemia 10/18/2014   Hypertension    Insomnia    Lack of concentration    Nasal fracture    Several   Otitis media 11/10/2017   Bilateral   Panic attacks    Syncope    Vertigo     Past Surgical History:  Procedure Laterality Date   CHOLECYSTECTOMY     CYSTOSCOPY N/A 05/10/2018   Procedure: CYSTOSCOPY WITH EXAM UNDER ANESTHESIA;  Surgeon: Ceasar Mons, MD;  Location: [facility];  Service: Urology;  Laterality: N/A;  ONLY  NEEDS 30 MIN   HYPOSPADIAS CORRECTION N/A 1978   INNER EAR SURGERY     right   LUMBAR DISC SURGERY     MASTOIDECTOMY Right    NOSE SURGERY     Fracture   tubes in bil ears     UPPER GASTROINTESTINAL ENDOSCOPY     UPPER GI ENDOSCOPY  01/09/2012   Esophageal dilation   Family History:  Family History  Problem Relation Age of Onset   COPD Mother    Hypertension Mother    Anxiety disorder Mother    Heart disease Father    Cancer Father    Diabetes Paternal  Grandmother    Diabetes Maternal Grandmother    Anxiety disorder Maternal Grandmother    Heart attack Brother    Lung cancer Maternal Uncle    Anxiety disorder Maternal Uncle    Anxiety disorder Maternal Aunt    OCD Maternal Aunt    Anxiety disorder Cousin    ADD / ADHD Son    Drug abuse Son    Colon cancer Neg Hx    Esophageal cancer Neg Hx    Rectal cancer Neg Hx    Stomach cancer Neg Hx    Family Psychiatric History: anxiety in multiple family members  Social History:  Social History   Substance and Sexual Activity  Alcohol Use Yes   Alcohol/week: 5.0 standard drinks   Types: 5 Glasses of wine per week   Comment: weekends     Social History   Substance and Sexual Activity  Drug Use No    Social History   Socioeconomic History   Marital status: Single    Spouse name: Not on file   Number of children: 3   Years of education: Not on file   Highest education level: Not on file  Occupational History   Occupation: Music therapist: [employer]  Tobacco Use   Smoking status: Never   Smokeless tobacco: Never  Vaping Use   Vaping Use: Never used  Substance and Sexual Activity   Alcohol use: Yes    Alcohol/week: 5.0 standard drinks    Types: 5 Glasses of wine per week    Comment: weekends   Drug use: No   Sexual activity: Yes    Partners: Female    Birth control/protection: None  Other Topics Concern   Not on file  Social History Narrative   Not on file   Social Determinants of Health   Financial Resource Strain: Not on file  Food Insecurity: Not on file  Transportation Needs: Not on file  Physical Activity: Not on file  Stress: Not on file  Social Connections: Not on file   SDOH:  SDOH Screenings   Alcohol Screen: Not on file  Depression (PHQ2-9): Low Risk    PHQ-2 Score: 4  Financial Resource Strain: Not on file  Food Insecurity: Not on file  Housing: Not on file  Physical Activity: Not on file  Social Connections: Not on file   Stress: Not on file  Tobacco Use: Low Risk    Smoking Tobacco Use: Never   Smokeless Tobacco Use: Never   Passive Exposure: Not on file  Transportation Needs: Not on file    Tobacco Cessation:  N/A, patient does not currently use tobacco products  Current Medications:  Current Facility-Administered Medications  Medication Dose Route Frequency Provider Last Rate Last Admin   acetaminophen (TYLENOL) tablet 650 mg  650 mg Oral Q6H PRN Teo Moede, Mellody Life, NP  ALPRAZolam Duanne Moron) tablet 0.5-1 mg  0.5-1 mg Oral QHS PRN Saprina Chuong L, NP   0.5 mg at 05/16/21 2117   alum & mag hydroxide-simeth (MAALOX/MYLANTA) 200-200-20 MG/5ML suspension 30 mL  30 mL Oral Q4H PRN Jacques Willingham L, NP       aspirin EC tablet 81 mg  81 mg Oral Daily Donnald Tabar L, NP   81 mg at 05/17/21 0936   buPROPion (WELLBUTRIN XL) 24 hr tablet 150 mg  150 mg Oral Daily Ival Bible, MD   150 mg at 05/17/21 3790   busPIRone (BUSPAR) tablet 5 mg  5 mg Oral BID Ival Bible, MD   5 mg at 05/17/21 2409   hydrOXYzine (ATARAX) tablet 25 mg  25 mg Oral TID PRN Cresencio Reesor L, NP       loperamide (IMODIUM) capsule 2-4 mg  2-4 mg Oral PRN Doha Boling L, NP       LORazepam (ATIVAN) tablet 1 mg  1 mg Oral Q6H PRN Torrey Ballinas L, NP       magnesium hydroxide (MILK OF MAGNESIA) suspension 30 mL  30 mL Oral Daily PRN Bradyn Soward L, NP       metFORMIN (GLUCOPHAGE-XR) 24 hr tablet 2,000 mg  2,000 mg Oral Q breakfast Adama Ferber L, NP   2,000 mg at 05/17/21 7353   multivitamin with minerals tablet 1 tablet  1 tablet Oral Daily Duwan Adrian L, NP       ondansetron (ZOFRAN-ODT) disintegrating tablet 4 mg  4 mg Oral Q6H PRN Charnee Turnipseed L, NP       ramipril (ALTACE) capsule 10 mg  10 mg Oral Daily Ival Bible, MD   10 mg at 05/17/21 0941   rosuvastatin (CRESTOR) tablet 10 mg  10 mg Oral Daily Ival Bible, MD   10 mg at 05/17/21 2992   thiamine (B-1) injection 100 mg  100 mg  Intramuscular Once Adwoa Axe L, NP       thiamine tablet 100 mg  100 mg Oral Daily Jonavon Trieu L, NP       Current Outpatient Medications  Medication Sig Dispense Refill   albuterol (VENTOLIN HFA) 108 (90 Base) MCG/ACT inhaler Inhale 2 puffs into the lungs every 4 (four) hours as needed for wheezing.     ALPRAZolam (XANAX) 0.5 MG tablet TAKE 1 TO 2 TABLETS BY MOUTH AT BEDTIME AS NEEDED 60 tablet 2   Ascorbic Acid (VITAMIN C PO) Take 500 mg by mouth daily.     aspirin EC 81 MG tablet Take 1 tablet (81 mg total) by mouth daily. 90 tablet 11   buPROPion (WELLBUTRIN SR) 150 MG 12 hr tablet Take 150 mg by mouth daily.     FLUoxetine (PROZAC) 40 MG capsule Take 1 capsule (40 mg total) by mouth daily. 90 capsule 1   meclizine (ANTIVERT) 25 MG tablet Take 25 mg by mouth 2 (two) times daily as needed for dizziness.     metFORMIN (GLUCOPHAGE-XR) 500 MG 24 hr tablet Take 4 tablets (2,000 mg total) by mouth daily with breakfast. 360 tablet 3   Multiple Vitamin (MULTIVITAMIN WITH MINERALS) TABS tablet Take 1 tablet by mouth daily.     Omega-3 Fatty Acids (FISH OIL PO) Take 1 capsule by mouth daily.     pantoprazole (PROTONIX) 40 MG tablet TAKE 1 TABLET BY MOUTH TWICE A DAY (Patient taking differently: Takes both tablets in the morning (80 mg)) 180 tablet 1   ramipril (ALTACE)  10 MG capsule Take 1 capsule (10 mg total) by mouth daily. Annual appt is due in must see provider for future refills 90 capsule 3   rosuvastatin (CRESTOR) 10 MG tablet Take 1 tablet (10 mg total) by mouth daily. 90 tablet 3   tadalafil (CIALIS) 20 MG tablet TAKE 1 TABLET BY MOUTH EVERY DAY AS NEEDED 8 tablet 7   triamcinolone (NASACORT) 55 MCG/ACT AERO nasal inhaler Place 2 sprays into the nose daily. (Patient taking differently: Place 2 sprays into the nose daily as needed.) 3 Inhaler 3    PTA Medications: (Not in a hospital admission)   Musculoskeletal  Strength & Muscle Tone: within normal limits Gait & Station:  normal Patient leans: N/A  Psychiatric Specialty Exam  Presentation  General Appearance: Appropriate for Environment  Eye Contact:Fair  Speech:Clear and Coherent  Speech Volume:Normal  Handedness:No data recorded  Mood and Affect  Mood:Euthymic  Affect:Congruent   Thought Process  Thought Processes:Coherent  Descriptions of Associations:Intact  Orientation:Full (Time, Place and Person)  Thought Content:WDL  Diagnosis of Schizophrenia or Schizoaffective disorder in past: No    Hallucinations:Hallucinations: None  Ideas of Reference:None  Suicidal Thoughts:Suicidal Thoughts: No  Homicidal Thoughts:Homicidal Thoughts: No   Sensorium  Memory:Immediate Fair; Remote Fair; Recent Fair  Judgment:Fair  Insight:Fair   Executive Functions  Concentration:Fair  Attention Span:Fair  [place]   Psychomotor Activity  Psychomotor Activity:Psychomotor Activity: Normal   Assets  Assets:Communication Skills; Desire for Improvement; Financial Resources/Insurance; Housing; Leisure Time; Physical Health; Social Support; Acupuncturist; Talents/Skills   Sleep  Sleep:Sleep: Fair   No data recorded  Physical Exam  Physical Exam Constitutional:      Appearance: Normal appearance.  HENT:     Head: Normocephalic and atraumatic.     Nose: Nose normal.  Eyes:     Conjunctiva/sclera: Conjunctivae normal.  Cardiovascular:     Rate and Rhythm: Normal rate.  Pulmonary:     Effort: Pulmonary effort is normal.  Musculoskeletal:     Cervical back: Normal range of motion.  Neurological:     Mental Status: He is alert and oriented to person, place, and time.   Review of Systems  Constitutional: Negative.   HENT: Negative.    Eyes: Negative.   Respiratory: Negative.    Cardiovascular: Negative.   Gastrointestinal: Negative.   Genitourinary: Negative.   Musculoskeletal: Negative.   Skin:  Negative.   Neurological: Negative.   Endo/Heme/Allergies: Negative.   Blood pressure 120/85, pulse 67, temperature (!) 97.1 F (36.2 C), temperature source Temporal, resp. rate 16, SpO2 100 %. There is no height or weight on file to calculate BMI.  Demographic Factors:  Male and Caucasian  Loss Factors: Loss of significant relationship  Historical Factors: NA  Risk Reduction Factors:   Sense of responsibility to family, Employed, and Positive social support  Continued Clinical Symptoms:  Previous Psychiatric Diagnoses and Treatments  Cognitive Features That Contribute To Risk:  None    Suicide Risk:  Minimal: No identifiable suicidal ideation.  Patients presenting with no risk factors but with morbid ruminations; may be classified as minimal risk based on the severity of the depressive symptoms  Plan Of Care/Follow-up recommendations:  Activity:  as tolerated   Follow-up with [facility] psychiatrist for medication management and therapy.  Continue Wellbutrin XL 150 mg by mouth daily for depression and anxiety Continue BuSpar 5 mg by mouth twice daily for anxiety  Disposition: Discharge to self/home.  Marissa Calamity, NP 05/17/2021,  10:44 AM

## 2021-05-17 NOTE — ED Notes (Signed)
Patient was discharged to home. Patient's brother will escort and transport patient home. Patient was given discharge instructions. Patient's medications will be sent to [facility] as patient requested.

## 2021-05-17 NOTE — Discharge Instructions (Addendum)

## 2021-05-17 NOTE — ED Notes (Signed)
Appears to be resting quietly with eyes closed , respirations even and unlabored , no distrss noted . Will continue to monitor for safety.

## 2021-05-19 ENCOUNTER — Ambulatory Visit: Payer: BC Managed Care – PPO | Admitting: Psychiatry

## 2021-05-26 ENCOUNTER — Telehealth: Payer: Self-pay | Admitting: Psychiatry

## 2021-05-26 DIAGNOSIS — F411 Generalized anxiety disorder: Secondary | ICD-10-CM

## 2021-05-26 DIAGNOSIS — F422 Mixed obsessional thoughts and acts: Secondary | ICD-10-CM

## 2021-05-26 DIAGNOSIS — F410 Panic disorder [episodic paroxysmal anxiety] without agoraphobia: Secondary | ICD-10-CM

## 2021-05-26 MED ORDER — BUPROPION HCL ER (SR) 150 MG PO TB12
150.0000 mg | ORAL_TABLET | Freq: Every day | ORAL | 0 refills | Status: DC
Start: 2021-05-26 — End: 2021-06-16

## 2021-05-26 MED ORDER — BUSPIRONE HCL 5 MG PO TABS: 5.0000 mg | ORAL_TABLET | Freq: Two times a day (BID) | ORAL | 0 refills | Status: DC

## 2021-05-26 NOTE — Telephone Encounter (Signed)
Meds sent

## 2021-05-26 NOTE — Telephone Encounter (Signed)
Pt called reporting med change from Prozac  to Wellbutrin when admitted also, added Buspar 5 mg 2/d. Pt will be out of Buspar and Wellbutrin before apt 3/20. Requesting Rx to [facility]

## 2021-06-02 ENCOUNTER — Ambulatory Visit: Payer: Self-pay

## 2021-06-02 ENCOUNTER — Ambulatory Visit: Payer: BC Managed Care – PPO | Admitting: Family Medicine

## 2021-06-02 ENCOUNTER — Other Ambulatory Visit: Payer: Self-pay

## 2021-06-02 VITALS — BP 138/82 | HR 71 | Ht 72.0 in | Wt 180.8 lb

## 2021-06-02 DIAGNOSIS — M25511 Pain in right shoulder: Secondary | ICD-10-CM | POA: Diagnosis not present

## 2021-06-02 DIAGNOSIS — G8929 Other chronic pain: Secondary | ICD-10-CM | POA: Diagnosis not present

## 2021-06-02 NOTE — Patient Instructions (Addendum)
Thank you for coming in today.  ? ?You received a steroid injection in your right shoulder today. Seek immediate medical attention if the joint becomes red, extremely painful, or is oozing fluid.  ? ?Recheck as needed. If not getting better we could refer you to physical therapy. ? ?

## 2021-06-02 NOTE — Progress Notes (Signed)
? ?I, Jacob [name], LAT, ATC acting as a scribe for Jacob Leader, MD. ? ?Jacob [name] is a 56 y.o. male who presents to [facility] today for R shoulder pain. Pt was previously seen by Dr. Tamala Julian on 10/27/19 for this issue and was given a R [place] steroid injection. From that visit a supraspinatus tear was visualized on [place] and pt has a hx of labral cyst. Pt last saw Dr. Georgina Snell for this issue on 05/11/19 and was given a R subacromial steroid injection and was taught HEP. Today, pt reports R shoulder has worsened again over the past 2-3 weeks. Pt recalls picking something up and feeling a strain/pain in his R shoulder. Pt locates pain to anterior, superior, and lateral aspect of the R GH joint. ? ?Radiates: no ?UE numbness/tingling: no ?UE weakness: yes- throwing ?Aggravates: ER, overhead ?Treatments tried: prior steroid injections ? ?Dx imaging: 11/10/18 R shoulder XR & c-spine CT ? 02/28/08 R shoulder XR ? ?Pertinent review of systems: No fevers or chills ? ?Relevant historical information: Hypertension.  Diabetes.  Patient works as a Engineer, building services. ? ? ?Exam:  ?BP 138/82   Pulse 71   Ht 6' (1.829 m)   Wt 180 lb 12.8 oz (82 kg)   SpO2 98%   BMI 24.52 kg/m?  ?General: Well Developed, well nourished, and in no acute distress.  ? ?MSK: Right shoulder normal. ?Normal motion pain with abduction. ?Intact strength. ?Positive Hawkins and Neer's test. ?Negative Yergason positive Yergason's and speeds test. ?Pulses capillary fill and sensation are intact. ? ? ? ?Lab and Radiology Results ? ?Procedure: Real-time Ultrasound Guided Injection of right shoulder subacromial bursa ?Device: Philips Affiniti 50G ?Images permanently stored and available for review in PACS ?Ultrasound evaluation prior to injection shows intact rotator cuff tendons.  Moderate subacromial bursitis. ?Mild hypoechoic fluid surrounds biceps tendon within the biceps tendon sheath. ?Verbal informed consent obtained.  Discussed  risks and benefits of procedure. Warned about infection bleeding damage to structures skin hypopigmentation and fat atrophy among others. ?Patient expresses understanding and agreement ?Time-out conducted.   ?Noted no overlying erythema, induration, or other signs of local infection.   ?Skin prepped in a sterile fashion.   ?Local anesthesia: Topical Ethyl chloride.   ?With sterile technique and under real time ultrasound guidance: 40 mg of Kenalog and 2 mL of Marcaine injected into subacromial bursa. Fluid seen entering the bursa.   ?Completed without difficulty   ?Pain immediately resolved suggesting accurate placement of the medication.   ?Advised to call if fevers/chills, erythema, induration, drainage, or persistent bleeding.   ?Images permanently stored and available for review in the ultrasound unit.  ?Impression: Technically successful ultrasound guided injection. ? ? ? ?EXAM: ?RIGHT SHOULDER - 2+ VIEW ?  ?COMPARISON:  None. ?  ?FINDINGS: ?No fracture or dislocation is seen. ?  ?Mild degenerative changes of the acromioclavicular joint. ?  ?The visualized soft tissues are unremarkable. ?  ?Visualized right lung is clear. ?  ?IMPRESSION: ?Negative. ?  ?  ?Electronically Signed ?  By: Julian Hy M.D. ?  On: 11/11/2018 00:18 ?I, Jacob [name], personally (independently) visualized and performed the interpretation of the images attached in this note. ? ? ? ? ? ?Assessment and Plan: ?56 y.o. male with right shoulder pain thought to be due to subacromial bursitis.  Plan to treat with subacromial injection today and continued home exercise program.  Recheck back as needed.  If the pain should return quickly would  recommend trial of physical therapy.  He can let me know.  Recheck back as needed. ? ? ?PDMP not reviewed this encounter. ?Orders Placed This Encounter  ?Procedures  ? [place] LIMITED JOINT SPACE STRUCTURES UP RIGHT(NO LINKED CHARGES)  ?  Order Specific Question:   Reason for Exam (SYMPTOM  OR DIAGNOSIS  REQUIRED)  ?  Answer:   right shoulder pain  ?  Order Specific Question:   Preferred imaging location?  ?  Answer:   [facility]  ? ?No orders of the defined types were placed in this encounter. ? ? ? ?Discussed warning signs or symptoms. Please see discharge instructions. Patient expresses understanding. ? ? ?The above documentation has been reviewed and is accurate and complete Jacob [name], M.D. ? ? ?

## 2021-06-06 ENCOUNTER — Other Ambulatory Visit: Payer: Self-pay | Admitting: Internal Medicine

## 2021-06-06 NOTE — Telephone Encounter (Signed)
Please refill as per office routine med refill policy (all routine meds to be refilled for 3 mo or monthly (per pt preference) up to one year from last visit, then month to month grace period for 3 mo, then further med refills will have to be denied) ? ?

## 2021-06-09 ENCOUNTER — Other Ambulatory Visit: Payer: Self-pay

## 2021-06-09 ENCOUNTER — Encounter: Payer: Self-pay | Admitting: Internal Medicine

## 2021-06-09 ENCOUNTER — Ambulatory Visit: Payer: BC Managed Care – PPO | Admitting: Psychiatry

## 2021-06-09 ENCOUNTER — Ambulatory Visit (INDEPENDENT_AMBULATORY_CARE_PROVIDER_SITE_OTHER): Payer: BC Managed Care – PPO | Admitting: Internal Medicine

## 2021-06-09 VITALS — BP 120/70 | HR 98 | Temp 98.3°F | Ht 72.0 in | Wt 179.2 lb

## 2021-06-09 DIAGNOSIS — E559 Vitamin D deficiency, unspecified: Secondary | ICD-10-CM

## 2021-06-09 DIAGNOSIS — E119 Type 2 diabetes mellitus without complications: Secondary | ICD-10-CM

## 2021-06-09 DIAGNOSIS — F410 Panic disorder [episodic paroxysmal anxiety] without agoraphobia: Secondary | ICD-10-CM

## 2021-06-09 DIAGNOSIS — E7800 Pure hypercholesterolemia, unspecified: Secondary | ICD-10-CM

## 2021-06-09 DIAGNOSIS — E538 Deficiency of other specified B group vitamins: Secondary | ICD-10-CM

## 2021-06-09 NOTE — Patient Instructions (Signed)
We have discussed the Cardiac CT Score test to measure the calcification level (if any) in your heart arteries.  This test has been ordered in our [facility], so please call [facility] directly, as they prefer this, at [phone] to be scheduled. ? ?Please continue all other medications as before, and refills have been done if requested. ? ?Please have the pharmacy call with any other refills you may need. ? ?Please continue your efforts at being more active, low cholesterol diet, and weight control. ? ?Please keep your appointments with your specialists as you may have planned ? ?Please make an Appointment to return in 3 months, or sooner if needed, also with Lab Appointment for testing done 3-5 days before at the [facility] (so this is for TWO appointments - please see the scheduling desk as you leave) ? ?Due to the ongoing Covid 19 pandemic, our lab now requires an appointment for any labs done at our office.  If you need labs done and do not have an appointment, please call our office ahead of time to schedule before presenting to the lab for your testing. ? ? ? ? ? ?

## 2021-06-09 NOTE — Assessment & Plan Note (Signed)
Lab Results  ?Component Value Date  ? HGBA1C 7.3 (A) 10/22/2020  ? ?Mild uncontrolled with last a1c reportedly 7.8 about 2 mo ago, pt to continue current medical treatment metformin as decliens change for now, for f/u lab next visit ? ?

## 2021-06-09 NOTE — Progress Notes (Signed)
Patient ID: KHALIL SZCZEPANIK [name], male   DOB: [birth year], 56 y.o.   MRN: [id number]         Chief Complaint:: f/u depression, dm       HPI:  Jacob [name] is a 56 y.o. male here with routine f/u; last a1c 7.7 per pt with cpx at [facility], working on diet and metformin; wondering about possible CAD, last stress test several yrs ago, has not had the CT cardiac score testin.  Pt denies chest pain, increased sob or doe, wheezing, orthopnea, PND, increased LE swelling, palpitations, dizziness or syncope.   Pt denies polydipsia, polyuria, or new focal neuro s/s.               Also son died, lost fiancee, stopped prozac, more ETOH for intake, was psych hospd and improved now on new med regimen with improved anxiety and stress, has appt with psychiatry and counseling next Monday, and back to church as well.     Wt Readings from Last 3 Encounters:  06/09/21 179 lb 3.2 oz (81.3 kg)  06/02/21 180 lb 12.8 oz (82 kg)  01/20/21 185 lb (83.9 kg)   BP Readings from Last 3 Encounters:  06/09/21 120/70  06/02/21 138/82  01/20/21 (!) 97/57   Immunization History  Administered Date(s) Administered   Influenza Inj Mdck Quad Pf 12/24/2018   Influenza,inj,Quad PF,6+ Mos 01/11/2013, 11/19/2016, 11/26/2017, 01/22/2020   Influenza-Unspecified 12/29/2015, 01/15/2021   PFIZER(Purple Top)SARS-COV-2 Vaccination 03/09/2020   Pneumococcal Polysaccharide-23 01/11/2013   Tdap 06/21/2001, 09/22/2012   Health Maintenance Due  Topic Date Due   OPHTHALMOLOGY EXAM  06/29/2018   HEMOGLOBIN A1C  04/24/2021      Past Medical History:  Diagnosis Date   Allergic rhinitis    Anxiety    Arthritis    Cholesteatoma of right ear    Diabetes mellitus ([place])    bordreline   Dysuria    Enlarged prostate    pt unaware   GERD (gastroesophageal reflux disease)    Headache    Hearing loss    Right ear   Hernia of abdominal wall    History of gross hematuria    History of head injury    History of kidney stones     History of palpitations    Hyperlipidemia 10/18/2014   Hypertension    Insomnia    Lack of concentration    Nasal fracture    Several   Otitis media 11/10/2017   Bilateral   Panic attacks    Syncope    Vertigo    Past Surgical History:  Procedure Laterality Date   CHOLECYSTECTOMY     CYSTOSCOPY N/A 05/10/2018   Procedure: CYSTOSCOPY WITH EXAM UNDER ANESTHESIA;  Surgeon: Ceasar Mons, MD;  Location: [facility];  Service: Urology;  Laterality: N/A;  ONLY NEEDS 30 MIN   HYPOSPADIAS CORRECTION N/A 1978   INNER EAR SURGERY     right   LUMBAR [place] SURGERY     MASTOIDECTOMY Right    NOSE SURGERY     Fracture   tubes in bil ears     UPPER GASTROINTESTINAL ENDOSCOPY     UPPER GI ENDOSCOPY  01/09/2012   Esophageal dilation    reports that he has never smoked. He has never used smokeless tobacco. He reports current alcohol use of about 5.0 standard drinks per week. He reports that he does not use drugs. family history includes ADD / ADHD in his son; Anxiety disorder in  his cousin, maternal aunt, maternal grandmother, maternal uncle, and mother; COPD in his mother; Cancer in his father; Diabetes in his maternal grandmother and paternal grandmother; Drug abuse in his son; Heart attack in his brother; Heart disease in his father; Hypertension in his mother; Lung cancer in his maternal uncle; OCD in his maternal aunt. No Known Allergies Current Outpatient Medications on File Prior to Visit  Medication Sig Dispense Refill   ALPRAZolam (XANAX) 0.5 MG tablet TAKE 1 TO 2 TABLETS BY MOUTH AT BEDTIME AS NEEDED 60 tablet 2   Ascorbic Acid (VITAMIN C PO) Take 500 mg by mouth daily.     aspirin EC 81 MG tablet Take 1 tablet (81 mg total) by mouth daily. 90 tablet 11   buPROPion (WELLBUTRIN SR) 150 MG 12 hr tablet Take 1 tablet (150 mg total) by mouth daily. 30 tablet 0   busPIRone (BUSPAR) 5 MG tablet Take 1 tablet (5 mg total) by mouth 2 (two) times daily. 60 tablet 0    metFORMIN (GLUCOPHAGE-XR) 500 MG 24 hr tablet Take 4 tablets (2,000 mg total) by mouth daily with breakfast. 360 tablet 3   Multiple Vitamin (MULTIVITAMIN WITH MINERALS) TABS tablet Take 1 tablet by mouth daily.     Omega-3 Fatty Acids (FISH OIL PO) Take 1 capsule by mouth daily.     pantoprazole (PROTONIX) 40 MG tablet TAKE 1 TABLET BY MOUTH TWICE A DAY (Patient taking differently: Takes both tablets in the morning (80 mg)) 180 tablet 1   ramipril (ALTACE) 10 MG capsule Take 1 capsule (10 mg total) by mouth daily. Annual appt is due in must see provider for future refills 90 capsule 3   rosuvastatin (CRESTOR) 10 MG tablet TAKE 1 TABLET BY MOUTH EVERY DAY 30 tablet 0   tadalafil (CIALIS) 20 MG tablet TAKE 1 TABLET BY MOUTH EVERY DAY AS NEEDED 8 tablet 7   triamcinolone (NASACORT) 55 MCG/ACT AERO nasal inhaler Place 2 sprays into the nose daily. (Patient taking differently: Place 2 sprays into the nose daily as needed.) 3 Inhaler 3   albuterol (VENTOLIN HFA) 108 (90 Base) MCG/ACT inhaler Inhale 2 puffs into the lungs every 4 (four) hours as needed for wheezing. (Patient not taking: Reported on 06/02/2021)     meclizine (ANTIVERT) 25 MG tablet Take 25 mg by mouth 2 (two) times daily as needed for dizziness. (Patient not taking: Reported on 06/02/2021)     No current facility-administered medications on file prior to visit.        ROS:  All others reviewed and negative.  Objective        PE:  BP 120/70 (BP Location: Left Arm, Patient Position: Sitting, Cuff Size: Large)    Pulse 98    Temp 98.3 F (36.8 C) (Oral)    Ht 6' (1.829 m)    Wt 179 lb 3.2 oz (81.3 kg)    SpO2 96%    BMI 24.30 kg/m                 Constitutional: Pt appears in NAD               HENT: Head: NCAT.                Right Ear: External ear normal.                 Left Ear: External ear normal.                Eyes: .  Pupils are equal, round, and reactive to light. Conjunctivae and EOM are normal               Nose: without d/c  or deformity               Neck: Neck supple. Gross normal ROM               Cardiovascular: Normal rate and regular rhythm.                 Pulmonary/Chest: Effort normal and breath sounds without rales or wheezing.                Abd:  Soft, NT, ND, + BS, no organomegaly               Neurological: Pt is alert. At baseline orientation, motor grossly intact               Skin: Skin is warm. No rashes, no other new lesions, LE edema - none               Psychiatric: Pt behavior is normal without agitation   Micro: none  Cardiac tracings I have personally interpreted today:  none  Pertinent Radiological findings (summarize): none   Lab Results  Component Value Date   WBC 5.2 05/15/2021   HGB 14.3 05/15/2021   HCT 42.1 05/15/2021   PLT 239 05/15/2021   GLUCOSE 205 (H) 05/15/2021   CHOL 123 05/15/2021   TRIG 110 05/15/2021   HDL 46 05/15/2021   LDLDIRECT 102.0 11/23/2016   LDLCALC 55 05/15/2021   ALT 53 (H) 05/15/2021   AST 36 05/15/2021   NA 137 05/15/2021   K 4.2 05/15/2021   CL 104 05/15/2021   CREATININE 1.17 05/15/2021   BUN 9 05/15/2021   CO2 24 05/15/2021   TSH 1.840 05/15/2021   PSA 1.07 08/10/2018   INR 0.96 12/11/2017   HGBA1C 7.3 (A) 10/22/2020   MICROALBUR <0.7 10/08/2017   Assessment/Plan:  CZAR YSAGUIRRE [name] is a 56 y.o. White or Caucasian [1] male with  has a past medical history of Allergic rhinitis, Anxiety, Arthritis, Cholesteatoma of right ear, Diabetes mellitus ([place]), Dysuria, Enlarged prostate, GERD (gastroesophageal reflux disease), Headache, Hearing loss, Hernia of abdominal wall, History of gross hematuria, History of head injury, History of kidney stones, History of palpitations, Hyperlipidemia (10/18/2014), Hypertension, Insomnia, Lack of concentration, Nasal fracture, Otitis media (11/10/2017), Panic attacks, Syncope, and Vertigo.  Hyperlipidemia Has labs done at [facility] [place] for cardiac Ct score testing  Diabetes mellitus without complication  ([place]) Lab Results  Component Value Date   HGBA1C 7.3 (A) 10/22/2020   Mild uncontrolled with last a1c reportedly 7.8 about 2 mo ago, pt to continue current medical treatment metformin as decliens change for now, for f/u lab next visit   Panic attacks Improved, pt to continue current wellbutrin and f/u psychiatry and counseling as planned  Followup: Return in about 3 months (around 09/09/2021).  Cathlean Cower, MD 06/09/2021 2:42 PM [facility]

## 2021-06-09 NOTE — Assessment & Plan Note (Addendum)
Has labs done at [place] for cardiac Ct score testing ?

## 2021-06-09 NOTE — Assessment & Plan Note (Signed)
Improved, pt to continue current wellbutrin and f/u psychiatry and counseling as planned ?

## 2021-06-11 ENCOUNTER — Other Ambulatory Visit: Payer: Self-pay | Admitting: Internal Medicine

## 2021-06-11 DIAGNOSIS — F422 Mixed obsessional thoughts and acts: Secondary | ICD-10-CM

## 2021-06-11 DIAGNOSIS — F411 Generalized anxiety disorder: Secondary | ICD-10-CM

## 2021-06-11 DIAGNOSIS — F410 Panic disorder [episodic paroxysmal anxiety] without agoraphobia: Secondary | ICD-10-CM

## 2021-06-16 ENCOUNTER — Ambulatory Visit: Payer: BC Managed Care – PPO | Admitting: Psychiatry

## 2021-06-16 ENCOUNTER — Other Ambulatory Visit: Payer: Self-pay

## 2021-06-16 ENCOUNTER — Encounter: Payer: Self-pay | Admitting: Psychiatry

## 2021-06-16 DIAGNOSIS — F411 Generalized anxiety disorder: Secondary | ICD-10-CM | POA: Diagnosis not present

## 2021-06-16 DIAGNOSIS — F422 Mixed obsessional thoughts and acts: Secondary | ICD-10-CM

## 2021-06-16 DIAGNOSIS — F410 Panic disorder [episodic paroxysmal anxiety] without agoraphobia: Secondary | ICD-10-CM | POA: Diagnosis not present

## 2021-06-16 MED ORDER — BUPROPION HCL ER (SR) 150 MG PO TB12: 150.0000 mg | ORAL_TABLET | Freq: Every day | ORAL | 0 refills | Status: DC

## 2021-06-16 MED ORDER — VILAZODONE HCL 10 MG PO TABS: ORAL_TABLET | ORAL | 1 refills | Status: DC

## 2021-06-16 NOTE — Progress Notes (Signed)
?    [facility] Counselor/Therapist Progress Note ? ?Patient ID: Jacob [name], MRN: [id number],   ? ?Date: 06/16/2021 ? ?Time Spent: 50 minutes  ? ?Treatment Type: Individual Therapy ? ?Reported Symptoms: anxiety ("improved some"), depression ("improved"), "last drank at birthday party Saturday night and shouldn't have drank quite as much, felt guilty") ? ?Mental Status Exam: ? ?Appearance:   Casual     ?Behavior:  Appropriate, Sharing, and Motivated  ?Motor:  Normal  ?Speech/Language:   Clear and Coherent  ?Affect:  Some anxiety  ?Mood:  Some anxiety  ?Thought process:  goal directed  ?Thought content:    WNL  ?Sensory/Perceptual disturbances:    WNL  ?Orientation:  oriented to person, place, time/date, situation, day of week, month of year, year, and stated date of June 16, 2021  ?Attention:  Good  ?Concentration:  Good and Fair  ?Memory:  Reporting some occasional short term memory issues  ?Fund of knowledge:   Good  ?Insight:    Fair  ?Judgment:   Good and Fair  ?Impulse Control:  Good and Fair  ? ?Risk Assessment: ?Danger to Self:  No ?Self-injurious Behavior: No ?Danger to Others: No ?Duty to Warn:no ?Physical Aggression / Violence:No  ?Access to Firearms a concern: No  ?Gang Involvement:No  ? ?Subjective:   Patient today reporting some "overdrinking due to depression about a month ago and at birthday party this past Sat night I overdrank". Denies any current thoughts of harming self. States "I still overthink but not quite as bad." "Really trying to come to terms with son's suicide, by talking here and with others and realize it really wasn't my fault."  "I just want to feel at peace knowing that he's with God and feeling the peace that he hadn't been feeling, and I just want to really believe all this."  States his faith has helped him as well as family/friends. Concerns re: dating relationship that started online and "narrowed it down to one." Other person is pushing situation beyond what he is  comfortable with, "and I've been talking with someone else also". Feels person is pushing  him way too hard to be more serious too soon.  Reflect some history of being more impulsive in relationships and is trying to change that and "not do stupid things" like I used to.  States how son's death "has changed me" and "it makes me closer to God as I talk to him more than I ever did."  Does feel like he is making some progress in "better handling my son's death." Wanted to discuss some boundary issues which we did today, especially when people try to push beyond them, and shared several examples in our working some on this today. Does feel like he "is healing some even though things will trip me up and I end up drinking. " Feels he does pretty well not drinking for a while and then something will happen I get tripped up and end up drinking". Cautioned him in any drinking due to his being on medications and also in his driving which he denies consuming alcohol while driving. ? ?Interventions: Solution-Oriented/Positive Psychology and Ego-Supportive ? ?Treatment goal plan:  ?Patient not signing treatment goal plan on computer screen due to [place]. ?Treatment goals: (relating to his resolution of grief re: son's suicide and other losses) ?Treatment goals remain on treatment plan as patient works with strategies to achieve his goals.  Progress is assessed each session and documented in the "progress" section of  treatment note. ?Long-term goal: ?Reduce overall level, frequency, and intensity of the anxiety so that daily functioning is not impaired. ?Short-term goal: ?Verbalize an understanding of the role that anxious/fearful thoughts and fears play in excessive worry and persistent anxiety symptoms. ?Strategies: ?Help the patient develop reality-based, positive cognitive messages.  ? ?Diagnosis: ?  ICD-10-CM   ?1. Generalized anxiety disorder  F41.1   ?  ? ?Plan:   Patient today showing motivation and actively participated  in session, stating that he is working harder on his drinking especially since "the incident about a month ago" and that "sometimes he does better than other times".  States he has no alcohol at home right now.  Acknowledges how he has used alcohol to cope and even more so after the suicide of his son, but is feeling that he is healing some through that and "should need alcohol to cope as much".  States that he has "lessened the quantity to a glass size "and wants to stop using it to cope with things.  Discussed some today other ways of coping with hurts, disappointments, losses, transitions, and things that happen unexpectedly.  Also shared some limit setting and relationships that do not feel healthy for him, despite the fact the other person pushes to proceed further in a relationship that he is comfortable with.  Admits that he needs to be able to state his limits and stick with them rather than allowing himself to be pushed in a direction he does not want to go. ?Reflects on how this has led to negative behaviors and relationships that he really did not want nor need to be in previously.  To continue working on this in future sessions especially as he noted "I am do need to work more on my relationships and choices." Encouraged patient in his practice of more positive behaviors including: Stopping his use of alcohol, reflecting on any progress he makes daily, getting outside daily and walking, staying in touch with people who are supportive to him, remain on his prescribed medications, healthy nutrition and exercise, stop assuming worst case scenarios, practice consistent positive self talk, stop self negating, stop self blaming, practice more intentional listening to others who are helpful to him, reduce overthinking and over analyzing, challenge and counteract his self doubt, allow his faith to be an emotional support as well as spiritual, letting go of things including guilt from the past that hold him back  as discussed in sessions, and realize the strength he shows working with goal-directed behaviors to move in a direction that supports his improved emotional health. ? ?Goal review and progress/challenges noted with patient. ? ?Next appointment within 2 to 3 weeks. ? ?This record has been created using [employer].  Chart creation errors have been sought, but may not always have been located and corrected.  Such creation errors do not reflect on the standard of medical care provided. ? ? ?Shanon Ace, LCSW ? ? ? ? ? ? ? ? ? ? ? ? ? ? ? ? ? ? ?

## 2021-06-16 NOTE — Progress Notes (Signed)
Jacob [name] ?[id number] ?[birth year] ?56 y.o. ? ?Subjective:  ? ?Patient ID:  Jacob [name] is a 55 y.o. (DOB [birth year]) male. ? ?Chief Complaint:  ?Chief Complaint  ?Patient presents with  ? Anxiety  ? Depression  ? ? ?Anxiety ?Patient reports no palpitations.  ? ? ?Depression ?       Past medical history includes anxiety.   ?Jacob [name] presents to the office today for follow-up of depression and anxiety. He reports that he went to [facility] due to severe depression and grief related to the loss of his son. He reports that Prozac was "making me worse" and that Wellbutrin and Buspar were prescribed while at [facility]. He reports that his resting HR increased after starting Buspar and states that once he stopped Buspar his HR returned to normal. He reports "my depression isn't half as bad as it was." He reports that he is now "coming to peace" with the loss of his son and 10 year relationship. He reports fear of recurrence of panic attacks. Denies any full blown panic attacks recently. He reports that he has some anxiety. He reports some worry and rumination. He reports that he will wake up and be unable to fall asleep at times. He reports that his energy and motivation have been ok. He reports adequate concentration. He reports that his appetite has improved after losing about 10 lbs when he saw ex-girlfriend with someone else. Denies SI.  ? ?He reports that he has been reducing ETOH use and now drinks 1 glass of wine about 4 nights a week. He reports that he was drinking a bottle of wine nightly before going to [facility].  ? ?He has been "trying to get back into my faith" and re-started Bible study.  ? ?Psychiatric Medication Trials: ?Sertraline- mild tremor. Affective dulling ?Prozac. ?Xanax- Has taken since he was 63 or 56 yo. ?Ambien- parasomnia ? ?Mini-Mental   ? ?[place] Office Visit from 06/16/2021 in [place] Visit from 12/16/2020 in [place] Visit  from 05/31/2018 in [place]  ?Total Score (max 30 points ) '30 30 30  '$ ? ?  ? ?PHQ2-9   ? ?[place] Office Visit from 06/09/2021 in [place] at [facility] Visit from 01/10/2021 in [place] at [facility] Visit from 07/24/2020 in [place] at [facility] Visit from 05/31/2020 in [place] at [facility] Visit from 10/08/2017 in [place]  ?PHQ-2 Total Score '2 1 3 '$ 0 0  ?PHQ-9 Total Score '5 4 7 '$ -- --  ? ?  ? ?[place] ED from [facility]/15/2021 in [facility]  ?C-SSRS RISK CATEGORY High Risk  ? ?  ?  ? ?Review of Systems:  ?Review of Systems  ?Cardiovascular:  Negative for palpitations.  ?Musculoskeletal:  Positive for back pain. Negative for gait problem.  ?Neurological:   ?     Occ tremor  ?Psychiatric/Behavioral:  Positive for depression.   ?     Please refer to HPI  ? ?Medications: I have reviewed the patient's current medications. ? ?Current Outpatient Medications  ?Medication Sig Dispense Refill  ? ALPRAZolam (XANAX) 0.5 MG tablet TAKE 1 TO 2 TABLETS BY MOUTH AT BEDTIME AS NEEDED 60 tablet 2  ? Ascorbic Acid (VITAMIN C PO) Take 500 mg by mouth daily.    ? aspirin EC 81 MG tablet Take 1 tablet (81 mg total)  by mouth daily. 90 tablet 11  ? metFORMIN (GLUCOPHAGE-XR) 500 MG 24 hr tablet Take 4 tablets (2,000 mg total) by mouth daily with breakfast. 360 tablet 3  ? Multiple Vitamin (MULTIVITAMIN WITH MINERALS) TABS tablet Take 1 tablet by mouth daily.    ? Omega-3 Fatty Acids (FISH OIL PO) Take 1 capsule by mouth daily.    ? pantoprazole (PROTONIX) 40 MG tablet TAKE 1 TABLET BY MOUTH TWICE A DAY (Patient taking differently: Takes both tablets in the morning (80 mg)) 180 tablet 1  ? ramipril (ALTACE) 10 MG capsule TAKE 1 CAPSULE EVERY DAY 90 capsule 3  ? rosuvastatin (CRESTOR) 10 MG tablet TAKE 1 TABLET BY MOUTH EVERY DAY 30 tablet 0  ? tadalafil (CIALIS) 20 MG tablet TAKE 1 TABLET  BY MOUTH EVERY DAY AS NEEDED 8 tablet 7  ? triamcinolone (NASACORT) 55 MCG/ACT AERO nasal inhaler Place 2 sprays into the nose daily. (Patient taking differently: Place 2 sprays into the nose daily as needed.) 3 Inhaler 3  ? Vilazodone HCl (VIIBRYD) 10 MG TABS Take 1/2 tablet daily with breakfast for one week, then increase to 1 tablet daily with breakfast 30 tablet 1  ? albuterol (VENTOLIN HFA) 108 (90 Base) MCG/ACT inhaler Inhale 2 puffs into the lungs every 4 (four) hours as needed for wheezing. (Patient not taking: Reported on 06/02/2021)    ? buPROPion (WELLBUTRIN SR) 150 MG 12 hr tablet Take 1 tablet (150 mg total) by mouth daily. 30 tablet 0  ? meclizine (ANTIVERT) 25 MG tablet Take 25 mg by mouth 2 (two) times daily as needed for dizziness. (Patient not taking: Reported on 06/02/2021)    ? ?No current facility-administered medications for this visit.  ? ? ?Medication Side Effects: None ? ?Allergies: No Known Allergies ? ?Past Medical History:  ?Diagnosis Date  ? Allergic rhinitis   ? Anxiety   ? Arthritis   ? Cholesteatoma of right ear   ? Diabetes mellitus ([place])   ? bordreline  ? Dysuria   ? Enlarged prostate   ? pt unaware  ? GERD (gastroesophageal reflux disease)   ? Headache   ? Hearing loss   ? Right ear  ? Hernia of abdominal wall   ? History of gross hematuria   ? History of head injury   ? History of kidney stones   ? History of palpitations   ? Hyperlipidemia 10/18/2014  ? Hypertension   ? Insomnia   ? Lack of concentration   ? Nasal fracture   ? Several  ? Otitis media 11/10/2017  ? Bilateral  ? Panic attacks   ? Syncope   ? Vertigo   ? ? ?Past Medical History, Surgical history, Social history, and Family history were reviewed and updated as appropriate.  ? ?Please see review of systems for further details on the patient's review from today.  ? ?Objective:  ? ?Physical Exam:  ?There were no vitals taken for this visit. ? ?Physical Exam ?Constitutional:   ?   General: He is not in acute  distress. ?Musculoskeletal:     ?   General: No deformity.  ?Neurological:  ?   Mental Status: He is alert and oriented to person, place, and time.  ?   Coordination: Coordination normal.  ?Psychiatric:     ?   Attention and Perception: Attention and perception normal. He does not perceive auditory or visual hallucinations.     ?   Mood and Affect: Mood is anxious. Mood is not depressed.  Affect is not labile, blunt, angry or inappropriate.     ?   Speech: Speech normal.     ?   Behavior: Behavior normal.     ?   Thought Content: Thought content normal. Thought content is not paranoid or delusional. Thought content does not include homicidal or suicidal ideation. Thought content does not include homicidal or suicidal plan.     ?   Cognition and Memory: Cognition and memory normal.     ?   Judgment: Judgment normal.  ?   Comments: Insight intact  ? ? ?Lab Review:  ?   ?Component Value Date/Time  ? NA 137 05/15/2021 1812  ? K 4.2 05/15/2021 1812  ? CL 104 05/15/2021 1812  ? CO2 24 05/15/2021 1812  ? GLUCOSE 205 (H) 05/15/2021 1812  ? BUN 9 05/15/2021 1812  ? CREATININE 1.17 05/15/2021 1812  ? CREATININE 1.37 (H) 10/07/2014 1202  ? CALCIUM 9.2 05/15/2021 1812  ? PROT 6.9 05/15/2021 1812  ? ALBUMIN 4.3 05/15/2021 1812  ? AST 36 05/15/2021 1812  ? ALT 53 (H) 05/15/2021 1812  ? ALKPHOS 62 05/15/2021 1812  ? BILITOT 0.5 05/15/2021 1812  ? GFRNONAA >60 05/15/2021 1812  ? GFRNONAA 61 10/07/2014 1202  ? GFRAA >60 03/09/2019 1335  ? GFRAA 70 10/07/2014 1202  ? ? ?   ?Component Value Date/Time  ? WBC 5.2 05/15/2021 1812  ? RBC 4.78 05/15/2021 1812  ? HGB 14.3 05/15/2021 1812  ? HCT 42.1 05/15/2021 1812  ? PLT 239 05/15/2021 1812  ? MCV 88.1 05/15/2021 1812  ? MCH 29.9 05/15/2021 1812  ? MCHC 34.0 05/15/2021 1812  ? RDW 12.5 05/15/2021 1812  ? LYMPHSABS 1.7 05/15/2021 1812  ? MONOABS 0.4 05/15/2021 1812  ? EOSABS 0.0 05/15/2021 1812  ? BASOSABS 0.0 05/15/2021 1812  ? ? ?No results found for: POCLITH, LITHIUM  ? ?No results found  for: PHENYTOIN, PHENOBARB, VALPROATE, CBMZ  ? ?.res ?Assessment: Plan:   ?Pt  seen for 30 minutes and time spent discussing possible treatment options for  anxiety since he reports that he was not able to tolerate Bu

## 2021-06-20 NOTE — Progress Notes (Signed)
? ?  I, Wendy Poet, LAT, ATC, am serving as scribe for Dr. [name]. ? ?Jacob [name] is a 56 y.o. male who presents to [place] at [facility] today for recurrent R shoulder pain due to subacromial bursitis and likely RC dysfunction.  He was last seen by Dr. Georgina Snell on 06/02/21 and had a R subacromial steroid injection.  He was also advised to con't his HEP.  Previously, pt had a R GHJ steroid injection w/ Dr. Tamala Julian on 10/27/19 and a subacromial injection w/ Dr. Georgina Snell on 05/11/19.  Today, pt reports last week, he was pulling up on a lug on a tire and felt a 'pop' in his R upper. Pt locates pain in his biceps w/ obvious deformity.  His pain is improving quite a bit over the last few days.  He denies any weakness. ? ?Dx imaging: 11/10/18 R shoulder XR & c-spine CT ?            02/28/08 R shoulder XR ? ?Pertinent review of systems: No fevers or chills ? ?Relevant historical information: Hypertension and diabetes ? ? ?Exam:  ?BP (!) 158/96   Pulse 83   Ht 6' (1.829 m)   Wt 179 lb 9.6 oz (81.5 kg)   SpO2 98%   BMI 24.36 kg/m?  ?General: Well Developed, well nourished, and in no acute distress.  ? ?MSK: Right shoulder and upper arm. ?Popeye arm deformity visible. ?Normal shoulder motion. ?Nontender at bicipital groove. ?Biceps strength is intact. ?Not much pain with resisted elbow flexion or supination. ? ? ? ?Assessment and Plan: ?56 y.o. 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  Fortunately he has preserved strength and function.  He had a subacromial injection earlier this month and on ultrasound evaluation prior to that injection did have evidence of biceps tendinitis.  We discussed that since he is doing pretty well watchful waiting for now should be reasonable.  Advised against surgery which he agrees with.  Recheck back as needed. ? ? ? ? ?Discussed warning signs or symptoms. Please see discharge instructions. Patient expresses  understanding. ? ? ?The above documentation has been reviewed and is accurate and complete [name], M.D. ? ? ?

## 2021-06-23 ENCOUNTER — Other Ambulatory Visit: Payer: Self-pay

## 2021-06-23 ENCOUNTER — Ambulatory Visit: Payer: BC Managed Care – PPO | Admitting: Family Medicine

## 2021-06-23 ENCOUNTER — Ambulatory Visit: Payer: Self-pay

## 2021-06-23 ENCOUNTER — Other Ambulatory Visit: Payer: Self-pay | Admitting: Internal Medicine

## 2021-06-23 VITALS — BP 158/96 | HR 83 | Ht 72.0 in | Wt 179.6 lb

## 2021-06-23 DIAGNOSIS — S46219A Strain of muscle, fascia and tendon of other parts of biceps, unspecified arm, initial encounter: Secondary | ICD-10-CM

## 2021-06-23 DIAGNOSIS — M25511 Pain in right shoulder: Secondary | ICD-10-CM

## 2021-06-23 DIAGNOSIS — G8929 Other chronic pain: Secondary | ICD-10-CM

## 2021-06-23 NOTE — Patient Instructions (Addendum)
Thank you for coming in today.  ? ?Recheck back as needed ?

## 2021-06-29 ENCOUNTER — Other Ambulatory Visit: Payer: Self-pay | Admitting: Internal Medicine

## 2021-06-29 NOTE — Telephone Encounter (Signed)
Please refill as per office routine med refill policy (all routine meds to be refilled for 3 mo or monthly (per pt preference) up to one year from last visit, then month to month grace period for 3 mo, then further med refills will have to be denied) ? ?

## 2021-07-21 ENCOUNTER — Ambulatory Visit (INDEPENDENT_AMBULATORY_CARE_PROVIDER_SITE_OTHER): Payer: Self-pay | Admitting: Psychiatry

## 2021-07-21 ENCOUNTER — Ambulatory Visit (INDEPENDENT_AMBULATORY_CARE_PROVIDER_SITE_OTHER)
Admission: RE | Admit: 2021-07-21 | Discharge: 2021-07-21 | Disposition: A | Discharge status: Home / Self Care | Payer: Self-pay | Source: Ambulatory Visit | Attending: Internal Medicine | Admitting: Internal Medicine

## 2021-07-21 DIAGNOSIS — E119 Type 2 diabetes mellitus without complications: Secondary | ICD-10-CM

## 2021-07-21 DIAGNOSIS — F411 Generalized anxiety disorder: Secondary | ICD-10-CM

## 2021-07-21 DIAGNOSIS — E7800 Pure hypercholesterolemia, unspecified: Secondary | ICD-10-CM

## 2021-07-21 NOTE — Progress Notes (Deleted)
[facility] Counselor/Therapist Progress Note ?Patient No-Showed today. ? ? ?Patient ID: Jacob [name], MRN: [id number],   ? ?Date: 07/21/2021 ?                                               ?Time Spent: ***                             ? ?Treatment Type: {CHL AMB THERAPY [phone]} ? ?Reported Symptoms: *** ? ?Mental Status Exam: ? ?Appearance:   {PSY:22683}     ?Behavior:  {PSY:21022743}  ?Motor:  {PSY:22302}  ?Speech/Language:   {PSY:22685}  ?Affect:  {PSY:22687}  ?Mood:  {PSY:31886}  ?Thought process:  {PSY:31888}  ?Thought content:    {[phone]}  ?Sensory/Perceptual disturbances:    {PSY:585-010-3990}  ?Orientation:  {PSY:30297}  ?Attention:  {PSY:22877}  ?Concentration:  {PSY:936-401-1561}  ?Memory:  {[phone]}  ?Fund of knowledge:   {PSY:936-401-1561}  ?Insight:    {PSY:936-401-1561}  ?Judgment:   {PSY:936-401-1561}  ?Impulse Control:  {PSY:936-401-1561}  ? ?Risk Assessment: ?Danger to Self:  {PSY:22692} ?Self-injurious Behavior: {PSY:22692} ?Danger to Others: {PSY:22692} ?Duty to Warn:{PSY:311194} ?Physical Aggression / Violence:{PSY:21197} ?Access to Firearms a concern: {PSY:21197} ?Gang Involvement:{PSY:21197} ? ?Subjective: ***  ? ?Interventions: {PSY:952-762-1017} ? ?Diagnosis: ?  ICD-10-CM   ?1. Generalized anxiety disorder  F41.1   ?  ? ? ?Plan: *** ? ?Shanon Ace, LCSW ? ? ? ? ? ? ? ? ? ? ? ? ? ? ? ? ? ? ?

## 2021-07-21 NOTE — Progress Notes (Signed)
Patient no-showed for session today ?

## 2021-07-21 NOTE — Patient Instructions (Signed)
Patient no showed for appt today

## 2021-07-22 ENCOUNTER — Telehealth: Payer: Self-pay | Admitting: Internal Medicine

## 2021-07-22 DIAGNOSIS — R931 Abnormal findings on diagnostic imaging of heart and coronary circulation: Secondary | ICD-10-CM

## 2021-07-22 NOTE — Telephone Encounter (Signed)
Pt requesting a cb w/ 07-21-2021 ct scoring results ?

## 2021-07-23 ENCOUNTER — Other Ambulatory Visit: Payer: Self-pay | Admitting: Internal Medicine

## 2021-07-23 MED ORDER — ASPIRIN EC 81 MG PO TBEC: 81.0000 mg | DELAYED_RELEASE_TABLET | Freq: Every day | ORAL | 11 refills | Status: AC

## 2021-07-23 NOTE — Telephone Encounter (Signed)
The test results show that your current treatment is OK, as the tests are stable except the cardiac CT score is mildy high at 74.  This is the 78th percentile for age, which is borderline for needing a referral to cardiology  If you would like this, please let me know.  Otherwise continue the crestor, and please start the Aspirin 81 mg per day (such as Ecotrin 82 mg OTC) ? ?There is no other need for change of treatment or further evaluation based on these results, at this time.  thanks\ ?

## 2021-07-23 NOTE — Telephone Encounter (Signed)
Notified pt w/ MD response. Pt states he would like referral to see cardiology.../lmb ?

## 2021-07-23 NOTE — Telephone Encounter (Signed)
Ok this is done 

## 2021-07-28 LAB — HM DIABETES EYE EXAM

## 2021-07-31 ENCOUNTER — Ambulatory Visit: Payer: BC Managed Care – PPO | Admitting: Psychiatry

## 2021-07-31 ENCOUNTER — Other Ambulatory Visit: Payer: Self-pay | Admitting: Psychiatry

## 2021-07-31 DIAGNOSIS — F422 Mixed obsessional thoughts and acts: Secondary | ICD-10-CM

## 2021-07-31 DIAGNOSIS — F411 Generalized anxiety disorder: Secondary | ICD-10-CM

## 2021-07-31 DIAGNOSIS — F410 Panic disorder [episodic paroxysmal anxiety] without agoraphobia: Secondary | ICD-10-CM

## 2021-08-04 ENCOUNTER — Ambulatory Visit: Payer: BC Managed Care – PPO | Admitting: Internal Medicine

## 2021-08-04 ENCOUNTER — Encounter: Payer: Self-pay | Admitting: Internal Medicine

## 2021-08-04 VITALS — BP 122/76 | HR 85 | Ht 72.0 in | Wt 183.2 lb

## 2021-08-04 DIAGNOSIS — R931 Abnormal findings on diagnostic imaging of heart and coronary circulation: Secondary | ICD-10-CM

## 2021-08-04 NOTE — Patient Instructions (Signed)
Medication Instructions:  ?Your physician recommends that you continue on your current medications as directed. Please refer to the Current Medication list given to you today. ? ?Follow-Up: ?At [facility], you and your health needs are our priority.  As part of our continuing mission to provide you with exceptional heart care, we have created designated Provider Care Teams.  These Care Teams include your primary Cardiologist (physician) and Advanced Practice Providers (APPs -  Physician Assistants and Nurse Practitioners) who all work together to provide you with the care you need, when you need it. ? ?We recommend signing up for the patient portal called "MyChart".  Sign up information is provided on this After Visit Summary.  MyChart is used to connect with patients for Virtual Visits (Telemedicine).  Patients are able to view lab/test results, encounter notes, upcoming appointments, etc.  Non-urgent messages can be sent to your provider as well.   ?To learn more about what you can do with MyChart, go to [email or web address].   ? ?Your next appointment:   ?AS NEEDED with Dr. Harl Bowie ? ? ?Important Information About Sugar ? ? ? ? ? ? ?

## 2021-08-04 NOTE — Progress Notes (Signed)
?Cardiology Office Note:   ? ?Date:  08/04/2021  ? ?ID:  Jacob [name], DOB 21-Nov-1965, MRN [id number] ? ?PCP:  Biagio Borg, MD ?  ?[facility] Providers ?Cardiologist:  None    ? ?Referring MD: Biagio Borg, MD  ? ?No chief complaint on file. ?Elevated CAC ? ?History of Present Illness:   ? ?Jacob [name] is a 56 y.o. male with a hx of HTN, anxiety, GERD referral for CAC score ? ?He notes chest pain in the past. He had a nuclear study in 2018 that was normal. Notes he has anxiety. Currently no significant dyspnea or chest pressure.  He is a non smoker. Family hx notable for a brother who had 2 MIs. He is a smoker. Parents have no CAD Hx. ? ?CAC ?Score 74. 78th percentile ? ?4/5/208-Nuclear MPI - EF 55%. No wall motion ? ?09/28/2015- Normal Echo ? ?Past Medical History:  ?Diagnosis Date  ? Allergic rhinitis   ? Anxiety   ? Arthritis   ? Cholesteatoma of right ear   ? Diabetes mellitus ([place])   ? bordreline  ? Dysuria   ? Enlarged prostate   ? pt unaware  ? GERD (gastroesophageal reflux disease)   ? Headache   ? Hearing loss   ? Right ear  ? Hernia of abdominal wall   ? History of gross hematuria   ? History of head injury   ? History of kidney stones   ? History of palpitations   ? Hyperlipidemia 10/18/2014  ? Hypertension   ? Insomnia   ? Lack of concentration   ? Nasal fracture   ? Several  ? Otitis media 11/10/2017  ? Bilateral  ? Panic attacks   ? Syncope   ? Vertigo   ? ? ?Past Surgical History:  ?Procedure Laterality Date  ? CHOLECYSTECTOMY    ? CYSTOSCOPY N/A 05/10/2018  ? Procedure: CYSTOSCOPY WITH EXAM UNDER ANESTHESIA;  Surgeon: Ceasar Mons, MD;  Location: [facility];  Service: Urology;  Laterality: N/A;  ONLY NEEDS 30 MIN  ? [place]  ? INNER EAR SURGERY    ? right  ? LUMBAR DISC SURGERY    ? MASTOIDECTOMY Right   ? NOSE SURGERY    ? Fracture  ? tubes in bil ears    ? UPPER GASTROINTESTINAL ENDOSCOPY    ? UPPER GI ENDOSCOPY  01/09/2012  ? Esophageal  dilation  ? ? ?Current Medications: ?No outpatient medications have been marked as taking for the 08/04/21 encounter (Appointment) with Janina Mayo, MD.  ?  ? ?Allergies:   Patient has no known allergies.  ? ?Social History  ? ?Socioeconomic History  ? Marital status: Single  ?  Spouse name: Not on file  ? Number of children: 3  ? Years of education: Not on file  ? Highest education level: Not on file  ?Occupational History  ? Occupation: Dealer  ?  Employer: [employer]  ?Tobacco Use  ? Smoking status: Never  ? Smokeless tobacco: Never  ?Vaping Use  ? Vaping Use: Never used  ?Substance and Sexual Activity  ? Alcohol use: Yes  ?  Alcohol/week: 5.0 standard drinks  ?  Types: 5 Glasses of wine per week  ?  Comment: weekends  ? Drug use: No  ? Sexual activity: Yes  ?  Partners: Female  ?  Birth control/protection: None  ?Other Topics Concern  ? Not on file  ?Social History Narrative  ?  Not on file  ? ?Social Determinants of Health  ? ?Financial Resource Strain: Not on file  ?Food Insecurity: Not on file  ?Transportation Needs: Not on file  ?Physical Activity: Not on file  ?Stress: Not on file  ?Social Connections: Not on file  ?  ? ?Family History: ?The patient's family history includes ADD / ADHD in his son; Anxiety disorder in his cousin, maternal aunt, maternal grandmother, maternal uncle, and mother; COPD in his mother; Cancer in his father; Diabetes in his maternal grandmother and paternal grandmother; Drug abuse in his son; Heart attack in his brother; Heart disease in his father; Hypertension in his mother; Lung cancer in his maternal uncle; OCD in his maternal aunt. There is no history of Colon cancer, Esophageal cancer, Rectal cancer, or Stomach cancer. ? ?ROS:   ?Please see the history of present illness.    ? All other systems reviewed and are negative. ? ?EKGs/Labs/Other Studies Reviewed:   ? ?The following studies were reviewed today: ? ? ?EKG:  EKG is  ordered today.  The ekg ordered today  demonstrates  ? ?08/04/2021-NSR ? ?Recent Labs: ?05/15/2021: ALT 53; BUN 9; Creatinine, Ser 1.17; Hemoglobin 14.3; Platelets 239; Potassium 4.2; Sodium 137; TSH 1.840  ?Recent Lipid Panel ?   ?Component Value Date/Time  ? CHOL 123 05/15/2021 1817  ? TRIG 110 05/15/2021 1817  ? HDL 46 05/15/2021 1817  ? CHOLHDL 2.7 05/15/2021 1817  ? VLDL 22 05/15/2021 1817  ? [place] 55 05/15/2021 1817  ? LDLDIRECT 102.0 11/23/2016 1553  ? ? ? ?Risk Assessment/Calculations:   ?  ? ?    ? ?Physical Exam:   ? ?VS:  ?Vitals:  ? 08/04/21 1350  ?BP: 122/76  ?Pulse: 85  ?SpO2: 99%  ? ? ? ?Wt Readings from Last 3 Encounters:  ?06/23/21 179 lb 9.6 oz (81.5 kg)  ?06/09/21 179 lb 3.2 oz (81.3 kg)  ?06/02/21 180 lb 12.8 oz (82 kg)  ?  ? ?GEN:  Well nourished, well developed in no acute distress ?HEENT: Normal ?NECK: No JVD; No carotid bruits ?LYMPHATICS: No lymphadenopathy ?CARDIAC: RRR, no murmurs, rubs, gallops ?RESPIRATORY:  Clear to auscultation without rales, wheezing or rhonchi  ?ABDOMEN: Soft, non-tender, non-distended ?MUSCULOSKELETAL:  No edema; No deformity  ?SKIN: Warm and dry ?NEUROLOGIC:  Alert and oriented x 3 ?PSYCHIATRIC:  Normal affect  ? ?ASSESSMENT:   ? ?CAC: His CAC score is mildly elevated only 78th percentile. He can continue on crestor.  LDL 55 mg/dL at goal. Otherwise, has no symptoms of coronary disease.  Recommend to continue with lifestyle modification and CVD risk mitigation : yearly A1c, lipid monitoring and blood pressure control. ? ?PLAN:   ? ?In order of problems listed above: ? ?Follow up as needed  ? ?   ? ?   ? ? ?Medication Adjustments/Labs and Tests Ordered: ?Current medicines are reviewed at length with the patient today.  Concerns regarding medicines are outlined above.  ?No orders of the defined types were placed in this encounter. ? ?No orders of the defined types were placed in this encounter. ? ? ?There are no Patient Instructions on file for this visit.  ? ?Signed, ?Janina Mayo, MD  ?08/04/2021 8:52 AM     ?[place] ?

## 2021-08-08 ENCOUNTER — Ambulatory Visit: Payer: Self-pay

## 2021-08-08 ENCOUNTER — Ambulatory Visit: Payer: BC Managed Care – PPO | Admitting: Family Medicine

## 2021-08-08 VITALS — BP 146/88 | HR 84 | Ht 72.0 in | Wt 187.4 lb

## 2021-08-08 DIAGNOSIS — G8929 Other chronic pain: Secondary | ICD-10-CM

## 2021-08-08 DIAGNOSIS — M25511 Pain in right shoulder: Secondary | ICD-10-CM | POA: Diagnosis not present

## 2021-08-08 NOTE — Progress Notes (Signed)
? ?I, Jacob [name], LAT, ATC acting as a scribe for Jacob Leader, MD. ? ?Jacob [name] is a 56 y.o. male who presents to [place] at [facility] today for continue R shoulder pain. Pt was last seen by Dr. 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 Today, pt reports pain is along the anterior aspect of the R shoulder. Pt c/o increased pain at night, disturbing his sleep. No radiating pain nor numbness/tingling. ? ?Dx imaging: 11/10/18 R shoulder XR & c-spine CT ?            02/28/08 R shoulder XR ? ?Pertinent review of systems: No fevers or chills ? ?Relevant historical information: Hypertension and diabetes. ?He will be moving to [place] in 2 weeks. He has Mondays off from work. He works as a Engineer, building services ? ? ?Exam:  ?BP (!) 146/88   Pulse 84   Ht 6' (1.829 m)   Wt 187 lb 6.4 oz (85 kg)   SpO2 97%   BMI 25.42 kg/m?  ?General: Well Developed, well nourished, and in no acute distress.  ? ?MSK: Right shoulder: Popeye arm deformity otherwise normal. ?Normal shoulder motion.  Pain with abduction. ?Intact strength pain with abduction. ?Positive Hawkins and Neer's test. ?Positive empty can test. ?Negative Yergason's and speeds test. ?Pulses capillary fill and sensation are intact distally. ? ? ? ?Lab and Radiology Results ? ?Procedure: Real-time Ultrasound Guided Injection of right shoulder subacromial bursa ?Device: Philips Affiniti 50G ?Images permanently stored and available for review in PACS ?Ultrasound evaluation prior to injection reveals absent biceps tendon from bicipital groove. ?Subscapularis tendon is intact and normal. ?Supraspinatus tendon is intact. ?Moderate subacromial bursitis is present. ?Infraspinatus tendon normal. ?AC joint moderate effusion. ?Verbal informed consent obtained.  Discussed risks and benefits of procedure. Warned about infection, bleeding, hyperglycemia damage to structures among others. ?Patient  expresses understanding and agreement ?Time-out conducted.   ?Noted no overlying erythema, induration, or other signs of local infection.   ?Skin prepped in a sterile fashion.   ?Local anesthesia: Topical Ethyl chloride.   ?With sterile technique and under real time ultrasound guidance: 40 mg of Kenalog and 2 mL of Marcaine injected into subacromial bursa. Fluid seen entering the bursa.   ?Completed without difficulty   ?Pain immediately resolved suggesting accurate placement of the medication.   ?Advised to call if fevers/chills, erythema, induration, drainage, or persistent bleeding.   ?Images permanently stored and available for review in the ultrasound unit.  ?Impression: Technically successful ultrasound guided injection. ? ? ? ? ? ?Assessment and Plan: ?56 y.o. male with right shoulder pain due to subacromial bursitis.  Plan for injection today and physical therapy.  He will be living in [place] and will have a day off a week so that will be a good location to do physical therapy.  Recheck back with me in about 6 weeks.  Return sooner if needed. ? ? ?PDMP not reviewed this encounter. ?Orders Placed This Encounter  ?Procedures  ? [place] LIMITED JOINT SPACE STRUCTURES UP RIGHT(NO LINKED CHARGES)  ?  Order Specific Question:   Reason for Exam (SYMPTOM  OR DIAGNOSIS REQUIRED)  ?  Answer:   right shoulder pain  ?  Order Specific Question:   Preferred imaging location?  ?  Answer:   [facility]  ? Ambulatory referral to Physical Therapy  ?  Referral Priority:   Routine  ?  Referral  Type:   Physical Medicine  ?  Referral Reason:   Specialty Services Required  ?  Requested Specialty:   Physical Therapy  ?  Number of Visits Requested:   1  ? ?No orders of the defined types were placed in this encounter. ? ? ? ?Discussed warning signs or symptoms. Please see discharge instructions. Patient expresses understanding. ? ? ?The above documentation has been reviewed and is accurate and complete Jacob [name], M.D. ? ? ?

## 2021-08-08 NOTE — Progress Notes (Signed)
Note duplication 

## 2021-08-08 NOTE — Patient Instructions (Addendum)
Thank you for coming in today.  ? ?You received a steroid injection in your right shoulder today. Seek immediate medical attention if the joint becomes red, extremely painful, or is oozing fluid.  ? ?I've referred you to Physical Therapy.  Let us know if you don't hear from them in one week.  ? ? ?

## 2021-08-11 ENCOUNTER — Ambulatory Visit: Payer: BC Managed Care – PPO | Admitting: Family Medicine

## 2021-08-18 ENCOUNTER — Telehealth: Payer: Self-pay | Admitting: Psychiatry

## 2021-08-18 ENCOUNTER — Ambulatory Visit: Payer: BC Managed Care – PPO | Admitting: Psychiatry

## 2021-08-18 MED ORDER — BUPROPION HCL ER (SR) 150 MG PO TB12
150.0000 mg | ORAL_TABLET | Freq: Every day | ORAL | 0 refills | Status: DC
Start: 2021-08-18 — End: 2021-09-15

## 2021-08-18 NOTE — Telephone Encounter (Signed)
Next visit 09/15/21. Jacob [name] called requesting a refill on his Wellbutrin. Pharmacy is:  [id number]  Phone:  [phone] Fax:  [phone]

## 2021-08-18 NOTE — Telephone Encounter (Signed)
Rx sent 

## 2021-08-24 ENCOUNTER — Other Ambulatory Visit: Payer: Self-pay | Admitting: Internal Medicine

## 2021-08-24 DIAGNOSIS — E119 Type 2 diabetes mellitus without complications: Secondary | ICD-10-CM

## 2021-08-24 NOTE — Telephone Encounter (Signed)
Please refill as per office routine med refill policy (all routine meds to be refilled for 3 mo or monthly (per pt preference) up to one year from last visit, then month to month grace period for 3 mo, then further med refills will have to be denied) ? ?

## 2021-08-27 ENCOUNTER — Ambulatory Visit: Payer: BC Managed Care – PPO | Admitting: Internal Medicine

## 2021-09-01 ENCOUNTER — Ambulatory Visit: Payer: BC Managed Care – PPO | Admitting: Internal Medicine

## 2021-09-01 ENCOUNTER — Encounter: Payer: Self-pay | Admitting: Nurse Practitioner

## 2021-09-01 ENCOUNTER — Ambulatory Visit: Payer: BC Managed Care – PPO | Admitting: Nurse Practitioner

## 2021-09-01 ENCOUNTER — Telehealth: Payer: Self-pay | Admitting: Nurse Practitioner

## 2021-09-01 VITALS — BP 138/82 | HR 75 | Temp 98.4°F | Ht 72.0 in | Wt 183.0 lb

## 2021-09-01 DIAGNOSIS — E0865 Diabetes mellitus due to underlying condition with hyperglycemia: Secondary | ICD-10-CM

## 2021-09-01 DIAGNOSIS — R531 Weakness: Secondary | ICD-10-CM | POA: Diagnosis not present

## 2021-09-01 DIAGNOSIS — E1165 Type 2 diabetes mellitus with hyperglycemia: Secondary | ICD-10-CM | POA: Diagnosis not present

## 2021-09-01 NOTE — Progress Notes (Signed)
Established Patient Office Visit  Subjective   Patient ID: Jacob [name], male    DOB: Nov 16, 1965  Age: 56 y.o. MRN: [id number]  Chief Complaint  Patient presents with   Follow-up    Diabetic level has increased to 8.3 A1c     Referral    Patient arrives today for the above.  He was at [facility] for which he attends for treatment of his back pain.  They checked his A1c and point-of-care machine and he was told his A1c was 8.3.  He reports that over the last few months he has been eating more carbs including potatoes, bean, and bread.  He thinks this is contributing to the A1c.  When he is at [facility] they recommend he start Rybelsus, he did not want to start this without discussing with the provider and his primary care office.  Ultimately his goal to try to focus on lifestyle measures first as opposed to adding new medication at this time.  He is currently on metformin 2000 mg by mouth daily.  He wants to know if taking cinnamon can be helpful.  He also wants to discuss his concerns with his neurologic symptoms he is experiencing.  He reports intermittent arm weakness and irritability.  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  He is concerned he may have MS.  He reports that the symptoms started about 1-1/2 years ago.  He tells me he has a neurology appointment coming up.    Review of Systems  Eyes:  Negative for blurred vision.  Cardiovascular:  Negative for chest pain.  Musculoskeletal:  Positive for back pain (low back pain; stiffness intermittent).  Neurological:  Positive for tingling and weakness.     Objective:     BP 138/82 (BP Location: Right Arm, Patient Position: Sitting, Cuff Size: Normal)   Pulse 75   Temp 98.4 F (36.9 C) (Oral)   Ht 6' (1.829 m)   Wt 183 lb (83 kg)   SpO2 98%   BMI 24.82 kg/m    Physical Exam Vitals reviewed.  Constitutional:      Appearance:  Normal appearance.  HENT:     Head: Normocephalic and atraumatic.  Cardiovascular:     Rate and Rhythm: Normal rate and regular rhythm.  Pulmonary:     Effort: Pulmonary effort is normal.     Breath sounds: Normal breath sounds.  Musculoskeletal:     Cervical back: Neck supple.  Skin:    General: Skin is warm and dry.  Neurological:     Mental Status: He is alert and oriented to person, place, and time.  Psychiatric:        Mood and Affect: Mood normal.        Behavior: Behavior normal.        Thought Content: Thought content normal.        Judgment: Judgment normal.     No results found for any visits on 09/01/21.    The ASCVD Risk score (Arnett DK, et al., 2019) failed to calculate for the following reasons:   The valid total cholesterol range is 130 to 320 mg/dL    Assessment & Plan:   Problem List Items Addressed This Visit   None Visit Diagnoses     Diabetes mellitus due to underlying condition with hyperglycemia, without long-term current use of insulin ([place])    -  Primary       Return for 3 months with Dr. Jenny Reichmann (A1C check) .    Ailene Ards, NP

## 2021-09-01 NOTE — Assessment & Plan Note (Signed)
Chronic, A1c above goal.  I offered to check lab draw today to verify A1c.  He would like to hold off on this.  I think it is reasonable for him to focus on lifestyle changes over the next 3 months prior to initiating additional pharmacological agent.  We did discuss that Rybelsus overall I think is a great option if he were to need another medication.  I also feel that cinnamon is a reasonable supplement to try.  He will follow-up in approximately 3 months with his PCP to have A1c rechecked.

## 2021-09-01 NOTE — Assessment & Plan Note (Signed)
I recommend he follow-up with neurology as scheduled for further evaluation, he reports he plans on doing so.

## 2021-09-01 NOTE — Telephone Encounter (Signed)
We will call the patient and verify that he has a neurology appointment scheduled?  He mentioned he already had a referral ordered but I do not see evidence of this in our chart, he may have been referred by a provider outside of our medical system so just wanted make sure he has an appointment coming up. If not I can order a referral today.

## 2021-09-03 ENCOUNTER — Telehealth: Payer: Self-pay | Admitting: Internal Medicine

## 2021-09-03 NOTE — Telephone Encounter (Signed)
Pt called wanting to discuss his EGD from October. He said he really never stopped having nausea and abd pain. EGD report mentioned doing an [place] if he was still having issues. Please advise.

## 2021-09-03 NOTE — Telephone Encounter (Signed)
Patient called states he had an EGD last year and he would like to discuss the results with a nurse because he said he doesn't think it was ever explained to him what exactly was found.

## 2021-09-04 ENCOUNTER — Other Ambulatory Visit: Payer: Self-pay

## 2021-09-04 DIAGNOSIS — R110 Nausea: Secondary | ICD-10-CM

## 2021-09-04 DIAGNOSIS — R1084 Generalized abdominal pain: Secondary | ICD-10-CM

## 2021-09-04 NOTE — Telephone Encounter (Signed)
Order in epic and pt knows he will be contacted by Rad scheduling to set up the [place].

## 2021-09-04 NOTE — Telephone Encounter (Signed)
Agree, complete abd [place] to eval gallbladder, pain and nausea

## 2021-09-11 ENCOUNTER — Ambulatory Visit (HOSPITAL_COMMUNITY)
Admission: RE | Admit: 2021-09-11 | Discharge: 2021-09-11 | Disposition: A | Discharge status: Home / Self Care | Payer: BC Managed Care – PPO | Source: Ambulatory Visit | Attending: Internal Medicine | Admitting: Internal Medicine

## 2021-09-11 DIAGNOSIS — R110 Nausea: Secondary | ICD-10-CM | POA: Insufficient documentation

## 2021-09-11 DIAGNOSIS — R1084 Generalized abdominal pain: Secondary | ICD-10-CM | POA: Diagnosis present

## 2021-09-15 ENCOUNTER — Ambulatory Visit: Payer: BC Managed Care – PPO | Admitting: Psychiatry

## 2021-09-15 ENCOUNTER — Encounter: Payer: Self-pay | Admitting: Psychiatry

## 2021-09-15 ENCOUNTER — Other Ambulatory Visit (HOSPITAL_COMMUNITY): Payer: BC Managed Care – PPO

## 2021-09-15 DIAGNOSIS — F411 Generalized anxiety disorder: Secondary | ICD-10-CM | POA: Diagnosis not present

## 2021-09-15 DIAGNOSIS — F410 Panic disorder [episodic paroxysmal anxiety] without agoraphobia: Secondary | ICD-10-CM | POA: Diagnosis not present

## 2021-09-15 MED ORDER — BUPROPION HCL ER (SR) 150 MG PO TB12: 150.0000 mg | ORAL_TABLET | Freq: Every day | ORAL | 1 refills | Status: DC

## 2021-09-15 NOTE — Progress Notes (Signed)
Jacob [name] [id number] 1965-10-11 56 y.o.  Subjective:   Patient ID:  Jacob [name] is a 56 y.o. (DOB 11/11/1965) male.  Chief Complaint:  Chief Complaint  Patient presents with   Follow-up    Anxiety, history of depression    HPI Jacob [name] presents to the office today for follow-up of anxiety and depression. He reports that he did not start Viibryd due to concerns about possible side effects and has continued Wellbutrin since he is not having side effects. He reports that his anxiety "doesn't get the the best of me like it used to." He reports that he has some health related anxiety "but not anywhere near as bad as it used to be." He denies any recent panic attacks. He reports that he does not have depression other than grieving the loss of his son. He reports that he has some difficulty recalling words. He reports some difficulty with memory. He reports that his concentration has been chronically impaired. Sleeping "ok, but I don't ever sleep great." He reports sleep has improved and is able to return to sleep after waking up to use the bathroom. Takes Xanax every night before bed. Appetite has been ok. Energy is chronically low and attributes this to working hard and long hours. Motivation has been good. Denies SI.   He reports that he has some work stress and has decided to stay for now due to pay.   He reports that he has met someone new and this is going well. He saw his grandchildren yesterday.   Has been going to church recently.   He reports that he has one glass of wine a night.   Psychiatric Medication Trials: Sertraline- mild tremor. Affective dulling Prozac-May have felt more depressed Buspar Xanax- Has taken since he was 33 or 56 yo. Ambien- parasomnia  Mini-Mental    Flowsheet Row Office Visit from 09/15/2021 in [place] Visit from 06/16/2021 in [place] Visit from 12/16/2020 in [place] Visit from 05/31/2018 in [place]  Total Score (max 30 points ) _0 PHQ2-9    [place] Visit from 06/09/2021 in [place] at [facility] Visit from 01/10/2021 in [place] at [facility] Visit from 07/24/2020 in [place] at [facility] Visit from 05/31/2020 in [place] at [facility] Visit from 10/08/2017 in [place]  PHQ-2 Total Score _1 0 0  PHQ-9 Total Score _2 -- --        Review of Systems:  Review of Systems  Gastrointestinal:  Positive for abdominal pain.  Endocrine:       He reports Hgb A1C was higher.   Musculoskeletal:  Positive for back pain. Negative for gait problem.  Psychiatric/Behavioral:         Please refer to HPI    Medications: I have reviewed the patient's current medications.  Current Outpatient Medications  Medication Sig Dispense Refill   ALPRAZolam (XANAX) 0.5 MG tablet TAKE 1 TO 2 TABLETS BY MOUTH AT BEDTIME AS NEEDED 60 tablet 2   aspirin EC 81 MG tablet Take 1 tablet (81 mg total) by mouth daily. 90 tablet 11   metFORMIN (GLUCOPHAGE-XR) 500 MG 24 hr tablet TAKE 4 TABLETS BY MOUTH ONCE DAILY WITH BREAKFAST 360 tablet 3   Multiple Vitamin (MULTIVITAMIN WITH MINERALS) TABS tablet Take  1 tablet by mouth daily.     Omega-3 Fatty Acids (FISH OIL PO) Take 1 capsule by mouth daily.     pantoprazole (PROTONIX) 40 MG tablet TAKE 1 TABLET BY MOUTH TWICE A DAY (Patient taking differently: Takes both tablets in the morning (80 mg)) 180 tablet 1   ramipril (ALTACE) 10 MG capsule TAKE 1 CAPSULE EVERY DAY 90 capsule 3   rosuvastatin (CRESTOR) 10 MG tablet TAKE 1 TABLET BY MOUTH EVERY DAY 90 tablet 3   tadalafil (CIALIS) 20 MG tablet TAKE 1 TABLET BY MOUTH EVERY DAY AS NEEDED 8 tablet 7   Ascorbic Acid (VITAMIN C PO) Take 500 mg by mouth daily. (Patient not taking: Reported on 09/01/2021)     buPROPion (WELLBUTRIN SR) 150  MG 12 hr tablet Take 1 tablet (150 mg total) by mouth daily. 90 tablet 1   triamcinolone (NASACORT) 55 MCG/ACT AERO nasal inhaler Place 2 sprays into the nose daily. (Patient not taking: Reported on 09/15/2021) 3 Inhaler 3   No current facility-administered medications for this visit.    Medication Side Effects: None  Allergies: No Known Allergies  Past Medical History:  Diagnosis Date   Allergic rhinitis    Anxiety    Arthritis    Cholesteatoma of right ear    Diabetes mellitus (HCC)    bordreline   Dysuria    Enlarged prostate    pt unaware   GERD (gastroesophageal reflux disease)    Headache    Hearing loss    Right ear   Hernia of abdominal wall    History of gross hematuria    History of head injury    History of kidney stones    History of palpitations    Hyperlipidemia 10/18/2014   Hypertension    Insomnia    Lack of concentration    Nasal fracture    Several   Otitis media 11/10/2017   Bilateral   Panic attacks    Syncope    Vertigo     Past Medical History, Surgical history, Social history, and Family history were reviewed and updated as appropriate.   Please see review of systems for further details on the patient's review from today.   Objective:   Physical Exam:  There were no vitals taken for this visit.  Physical Exam Constitutional:      General: He is not in acute distress. Musculoskeletal:        General: No deformity.  Neurological:     Mental Status: He is alert and oriented to person, place, and time.     Coordination: Coordination normal.  Psychiatric:        Attention and Perception: Attention and perception normal. He does not perceive auditory or visual hallucinations.        Mood and Affect: Mood is not depressed. Affect is not labile, blunt, angry or inappropriate.        Speech: Speech normal.        Behavior: Behavior normal.        Thought Content: Thought content normal. Thought content is not paranoid or delusional. Thought  content does not include homicidal or suicidal ideation. Thought content does not include homicidal or suicidal plan.        Cognition and Memory: Cognition and memory normal.        Judgment: Judgment normal.     Comments: Insight intact Mood presents as less anxious compared to recent exams     Lab Review:       Component Value Date/Time   NA 137 05/15/2021 1812   K 4.2 05/15/2021 1812   CL 104 05/15/2021 1812   CO2 24 05/15/2021 1812   GLUCOSE 205 (H) 05/15/2021 1812   BUN 9 05/15/2021 1812   CREATININE 1.17 05/15/2021 1812   CREATININE 1.37 (H) 10/07/2014 1202   CALCIUM 9.2 05/15/2021 1812   PROT 6.9 05/15/2021 1812   ALBUMIN 4.3 05/15/2021 1812   AST 36 05/15/2021 1812   ALT 53 (H) 05/15/2021 1812   ALKPHOS 62 05/15/2021 1812   BILITOT 0.5 05/15/2021 1812   GFRNONAA >60 05/15/2021 1812   GFRNONAA 61 10/07/2014 1202   GFRAA >60 03/09/2019 1335   GFRAA 70 10/07/2014 1202       Component Value Date/Time   WBC 5.2 05/15/2021 1812   RBC 4.78 05/15/2021 1812   HGB 14.3 05/15/2021 1812   HCT 42.1 05/15/2021 1812   PLT 239 05/15/2021 1812   MCV 88.1 05/15/2021 1812   MCH 29.9 05/15/2021 1812   MCHC 34.0 05/15/2021 1812   RDW 12.5 05/15/2021 1812   LYMPHSABS 1.7 05/15/2021 1812   MONOABS 0.4 05/15/2021 1812   EOSABS 0.0 05/15/2021 1812   BASOSABS 0.0 05/15/2021 1812    No results found for: "POCLITH", "LITHIUM"   No results found for: "PHENYTOIN", "PHENOBARB", "VALPROATE", "CBMZ"   .res Assessment: Plan:    Pt seen for 30 minutes and time spent assessing cognition with MMSE since pt requests re-check for re-assurance. He reports that he would like to continue Wellbutrin SR 150 mg po qd since mood and anxiety have improved and he is not experiencing any side effects. He reports that he prefers not to start an additional medication at this time for anxiety since he reports that anxiety symptoms have been manageable. He reports that he may resume therapy with [name]  Dowd, LCSW.  Pt to follow-up in 6-7 months or sooner if clinically indicated.  Patient advised to contact office with any questions, adverse effects, or acute worsening in signs and symptoms.   Shellie was seen today for follow-up.  Diagnoses and all orders for this visit:  Generalized anxiety disorder  Panic disorder  Other orders -     buPROPion (WELLBUTRIN SR) 150 MG 12 hr tablet; Take 1 tablet (150 mg total) by mouth daily.     Please see After Visit Summary for patient specific instructions.  Future Appointments  Date Time Provider Department Center  12/08/2021 10:20 AM John, James W, MD [facility] None  04/17/2022 12:45 PM Carter, Jessica, PMHNP [facility] None    No orders of the defined types were placed in this encounter.   ------------------------------- 

## 2021-09-16 ENCOUNTER — Telehealth: Payer: Self-pay | Admitting: Family Medicine

## 2021-09-16 NOTE — Telephone Encounter (Signed)
We've never seen the pt for low back pain.  He needs to schedule an appt w/ Dr. Georgina Snell to seen for his low back and get x-rays before an MRI can be ordered.  Please contact pt to schedule an appt.

## 2021-09-16 NOTE — Telephone Encounter (Signed)
Patient called asking if an MRI of his back could be ordered?  Location: [place]

## 2021-09-17 NOTE — Telephone Encounter (Signed)
Spoke to patient. Will discuss further at his visit on Monday.

## 2021-09-18 ENCOUNTER — Ambulatory Visit: Payer: BC Managed Care – PPO | Admitting: Sports Medicine

## 2021-09-18 ENCOUNTER — Ambulatory Visit (INDEPENDENT_AMBULATORY_CARE_PROVIDER_SITE_OTHER): Payer: BC Managed Care – PPO

## 2021-09-18 VITALS — BP 130/80 | HR 79 | Ht 72.0 in | Wt 185.0 lb

## 2021-09-18 DIAGNOSIS — G8929 Other chronic pain: Secondary | ICD-10-CM | POA: Diagnosis not present

## 2021-09-18 DIAGNOSIS — M5442 Lumbago with sciatica, left side: Secondary | ICD-10-CM

## 2021-09-18 DIAGNOSIS — M5441 Lumbago with sciatica, right side: Secondary | ICD-10-CM | POA: Diagnosis not present

## 2021-09-18 DIAGNOSIS — M545 Low back pain, unspecified: Secondary | ICD-10-CM | POA: Diagnosis not present

## 2021-09-18 MED ORDER — MELOXICAM 15 MG PO TABS: 15.0000 mg | ORAL_TABLET | Freq: Every day | ORAL | 0 refills | Status: DC

## 2021-09-18 NOTE — Progress Notes (Signed)
Benito Mccreedy D.[place] Phone: [phone]   Assessment and Plan:     1. Chronic bilateral low back pain with bilateral sciatica -Chronic with exacerbation, initial sports medicine visit - Patient experienced years of low back pain that will come and go and occasionally cause radicular symptoms down 1 leg, however defected leg will change based on the flare.  Denies having bilateral radicular symptoms at any time.  Radicular symptoms not reproducible at today's visit - Suspect patient is experiencing intermittent muscular flares caused by lifting heavy objects with his back while working at an [employer].  Handout provided.  Stressed the importance of lifting with legs in with low back - Start meloxicam 15 mg daily x2 weeks.  If still having pain after 2 weeks, complete 3rd-week of meloxicam. May use remaining meloxicam as needed once daily for pain control.  Do not to use additional NSAIDs while taking meloxicam.  May use Tylenol [phone] mg 2 to 3 times a day for breakthrough pain. - X-ray obtained in clinic.  My interpretation: No acute fracture or vertebral collapse.  Minimal anterior listhesis L4 and L5 and mild anterior vertebral spurring L3, L4, L5.  - DG Lumbar Spine 2-3 Views; Future    Pertinent previous records reviewed include none   Follow Up: 6 weeks for reevaluation.  If no improvement or worsening of symptoms, could discuss OMT versus physical therapy versus MRI   Subjective:   I, Moenique Parris, am serving as a Education administrator for Doctor Glennon Mac  Chief Complaint: would like an MRI   HPI:  08/08/2021 TJAY VELAZQUEZ III is a 56 y.o. male who presents to [place] at [facility] today for continue R shoulder pain. Pt was last seen by Dr. 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 Today, pt reports pain is along the  anterior aspect of the R shoulder. Pt c/o increased pain at night, disturbing his sleep. No radiating pain nor numbness/tingling.   Dx imaging: 11/10/18 R shoulder XR & c-spine CT             02/28/08 R shoulder XR   Pertinent review of systems: No fevers or chills   Relevant historical information: Hypertension and diabetes. He will be moving to [place] in 2 weeks. He has Mondays off from work. He works as a Engineer, building services  09/18/21 Patient states would like an mri is having full body pains that are coming from the back   Relevant Historical Information: DM type II, hypertension  Additional pertinent review of systems negative.   Current Outpatient Medications:    ALPRAZolam (XANAX) 0.5 MG tablet, TAKE 1 TO 2 TABLETS BY MOUTH AT BEDTIME AS NEEDED, Disp: 60 tablet, Rfl: 2   Ascorbic Acid (VITAMIN C PO), Take 500 mg by mouth daily., Disp: , Rfl:    aspirin EC 81 MG tablet, Take 1 tablet (81 mg total) by mouth daily., Disp: 90 tablet, Rfl: 11   buPROPion (WELLBUTRIN SR) 150 MG 12 hr tablet, Take 1 tablet (150 mg total) by mouth daily., Disp: 90 tablet, Rfl: 1   meloxicam (MOBIC) 15 MG tablet, Take 1 tablet (15 mg total) by mouth daily., Disp: 30 tablet, Rfl: 0   metFORMIN (GLUCOPHAGE-XR) 500 MG 24 hr tablet, TAKE 4 TABLETS BY MOUTH ONCE DAILY WITH BREAKFAST, Disp: 360 tablet, Rfl: 3  Multiple Vitamin (MULTIVITAMIN WITH MINERALS) TABS tablet, Take 1 tablet by mouth daily., Disp: , Rfl:    Omega-3 Fatty Acids (FISH OIL PO), Take 1 capsule by mouth daily., Disp: , Rfl:    pantoprazole (PROTONIX) 40 MG tablet, TAKE 1 TABLET BY MOUTH TWICE A DAY (Patient taking differently: Takes both tablets in the morning (80 mg)), Disp: 180 tablet, Rfl: 1   ramipril (ALTACE) 10 MG capsule, TAKE 1 CAPSULE EVERY DAY, Disp: 90 capsule, Rfl: 3   rosuvastatin (CRESTOR) 10 MG tablet, TAKE 1 TABLET BY MOUTH EVERY DAY, Disp: 90 tablet, Rfl: 3   tadalafil (CIALIS) 20 MG tablet, TAKE 1 TABLET BY MOUTH EVERY DAY AS  NEEDED, Disp: 8 tablet, Rfl: 7   triamcinolone (NASACORT) 55 MCG/ACT AERO nasal inhaler, Place 2 sprays into the nose daily., Disp: 3 Inhaler, Rfl: 3   Objective:     Vitals:   09/18/21 1319  BP: 130/80  Pulse: 79  SpO2: 98%  Weight: 185 lb (83.9 kg)  Height: 6' (1.829 m)      Body mass index is 25.09 kg/m.    Physical Exam:    Gen: Appears well, nad, nontoxic and pleasant Psych: Alert and oriented, appropriate mood and affect Neuro: sensation intact, strength is 5/5 in upper and lower extremities, muscle tone wnl Skin: no susupicious lesions or rashes  Back - Normal skin, Spine with normal alignment and no deformity.   No tenderness to vertebral process palpation.   Bilateral lumbar Paraspinous muscles are moderately tender and without spasm Straight leg raise negative, though hamstring tightness  Pain exacerbated in lumbar spine with flexion, extension, rotation, though did not reproduce radicular symptoms  Electronically signed by:  Benito Mccreedy D.[facility] 1:56 PM 09/18/21

## 2021-09-18 NOTE — Patient Instructions (Addendum)
Good to see you  - Start meloxicam 15 mg daily x2 weeks.  If still having pain after 2 weeks, complete 3rd-week of meloxicam. May use remaining meloxicam as needed once daily for pain control.  Do not to use additional NSAIDs while taking meloxicam.  May use Tylenol [phone] 2 to 3 times a day for breakthrough pain. Low back HEP 6 week follow up

## 2021-09-19 ENCOUNTER — Encounter: Payer: Self-pay | Admitting: Internal Medicine

## 2021-09-22 ENCOUNTER — Ambulatory Visit: Payer: BC Managed Care – PPO | Admitting: Family Medicine

## 2021-10-14 ENCOUNTER — Ambulatory Visit: Payer: BC Managed Care – PPO | Admitting: Internal Medicine

## 2021-10-14 ENCOUNTER — Encounter: Payer: Self-pay | Admitting: Internal Medicine

## 2021-10-14 ENCOUNTER — Telehealth: Payer: Self-pay | Admitting: *Deleted

## 2021-10-14 VITALS — BP 124/82 | HR 73 | Temp 98.1°F | Ht 72.0 in | Wt 184.0 lb

## 2021-10-14 DIAGNOSIS — E538 Deficiency of other specified B group vitamins: Secondary | ICD-10-CM

## 2021-10-14 DIAGNOSIS — F410 Panic disorder [episodic paroxysmal anxiety] without agoraphobia: Secondary | ICD-10-CM

## 2021-10-14 DIAGNOSIS — E7800 Pure hypercholesterolemia, unspecified: Secondary | ICD-10-CM

## 2021-10-14 DIAGNOSIS — E559 Vitamin D deficiency, unspecified: Secondary | ICD-10-CM

## 2021-10-14 DIAGNOSIS — G4700 Insomnia, unspecified: Secondary | ICD-10-CM

## 2021-10-14 DIAGNOSIS — Z0001 Encounter for general adult medical examination with abnormal findings: Secondary | ICD-10-CM | POA: Diagnosis not present

## 2021-10-14 DIAGNOSIS — E119 Type 2 diabetes mellitus without complications: Secondary | ICD-10-CM

## 2021-10-14 DIAGNOSIS — R42 Dizziness and giddiness: Secondary | ICD-10-CM

## 2021-10-14 DIAGNOSIS — H6993 Unspecified Eustachian tube disorder, bilateral: Secondary | ICD-10-CM

## 2021-10-14 DIAGNOSIS — J309 Allergic rhinitis, unspecified: Secondary | ICD-10-CM

## 2021-10-14 DIAGNOSIS — Z125 Encounter for screening for malignant neoplasm of prostate: Secondary | ICD-10-CM

## 2021-10-14 DIAGNOSIS — R040 Epistaxis: Secondary | ICD-10-CM | POA: Diagnosis not present

## 2021-10-14 DIAGNOSIS — I10 Essential (primary) hypertension: Secondary | ICD-10-CM

## 2021-10-14 DIAGNOSIS — E0865 Diabetes mellitus due to underlying condition with hyperglycemia: Secondary | ICD-10-CM

## 2021-10-14 DIAGNOSIS — H6983 Other specified disorders of Eustachian tube, bilateral: Secondary | ICD-10-CM

## 2021-10-14 MED ORDER — BUSPIRONE HCL 10 MG PO TABS
10.0000 mg | ORAL_TABLET | Freq: Two times a day (BID) | ORAL | 3 refills | Status: DC
Start: 2021-10-14 — End: 2022-07-03

## 2021-10-14 MED ORDER — PANTOPRAZOLE SODIUM 40 MG PO TBEC: 40.0000 mg | DELAYED_RELEASE_TABLET | Freq: Two times a day (BID) | ORAL | 3 refills | Status: DC

## 2021-10-14 MED ORDER — TADALAFIL 20 MG PO TABS: 20.0000 mg | ORAL_TABLET | Freq: Every day | ORAL | 7 refills | Status: DC | PRN

## 2021-10-14 MED ORDER — BUSPIRONE HCL 10 MG PO TABS: 10.0000 mg | ORAL_TABLET | Freq: Two times a day (BID) | ORAL | 3 refills | Status: DC

## 2021-10-14 MED ORDER — ROSUVASTATIN CALCIUM 10 MG PO TABS: 10.0000 mg | ORAL_TABLET | Freq: Every day | ORAL | 3 refills | Status: DC

## 2021-10-14 MED ORDER — METFORMIN HCL ER 500 MG PO TB24: ORAL_TABLET | ORAL | 3 refills | Status: DC

## 2021-10-14 MED ORDER — RAMIPRIL 10 MG PO CAPS: 10.0000 mg | ORAL_CAPSULE | Freq: Every day | ORAL | 3 refills | Status: DC

## 2021-10-14 MED ORDER — ALPRAZOLAM 0.5 MG PO TABS: 0.5000 mg | ORAL_TABLET | Freq: Every evening | ORAL | 5 refills | Status: DC | PRN

## 2021-10-14 MED ORDER — MECLIZINE HCL 12.5 MG PO TABS: 12.5000 mg | ORAL_TABLET | Freq: Three times a day (TID) | ORAL | 1 refills | Status: DC | PRN

## 2021-10-14 MED ORDER — FEXOFENADINE HCL 180 MG PO TABS: 180.0000 mg | ORAL_TABLET | Freq: Every day | ORAL | 3 refills | Status: DC

## 2021-10-14 NOTE — Telephone Encounter (Signed)
Pt  was on cover=my-meds need PA on Pantoprazole. Submitted PA w/ (Request key: [id number]). PA sent to plan.Marland KitchenJohny Chess

## 2021-10-14 NOTE — Progress Notes (Signed)
Patient ID: Jacob [name], male   DOB: 1965-08-18, 56 y.o.   MRN: [id number]         Chief Complaint:: wellness exam and Office Visit (Dizziness, ears clogged, nose bleeds)        HPI:  Jacob [name] is a 56 y.o. male here for wellness exam; declines shingrix, o/w up to date                        Also recalls an a1c done approx 8.3 at [facility].  Does have several wks ongoing nasal allergy symptoms with clearish congestion, itch and sneezing, without fever, pain, ST, cough, swelling or wheezing, but is associated with bilateral ear fullness and mild intermittent vertigo, and several recent left sided nosebleed..  Pt denies chest pain, increased sob or doe, wheezing, orthopnea, PND, increased LE swelling, palpitations, dizziness or syncope.   Pt denies polydipsia, polyuria, or new focal neuro s/s.    Pt denies fever, wt loss, night sweats, loss of appetite, or other constitutional symptoms  Pt also repots increased irritability, and worsening insomnia, just cant stay asleep most nights in the past month.     Wt Readings from Last 3 Encounters:  10/14/21 184 lb (83.5 kg)  09/18/21 185 lb (83.9 kg)  09/01/21 183 lb (83 kg)   BP Readings from Last 3 Encounters:  10/14/21 124/82  09/18/21 130/80  09/01/21 138/82   Immunization History  Administered Date(s) Administered   Influenza Inj Mdck Quad Pf 12/24/2018   Influenza,inj,Quad PF,6+ Mos 01/11/2013, 11/19/2016, 11/26/2017, 01/22/2020   Influenza-Unspecified 12/29/2015, 01/15/2021   Janssen (J&J) SARS-COV-2 Vaccination 10/04/2019   PFIZER(Purple Top)SARS-COV-2 Vaccination 03/09/2020   Pneumococcal Polysaccharide-23 01/11/2013   Tdap 06/21/2001, 09/22/2012   Health Maintenance Due  Topic Date Due   Diabetic kidney evaluation - Urine ACR  10/09/2018   HEMOGLOBIN A1C  04/24/2021      Past Medical History:  Diagnosis Date   Allergic rhinitis    Anxiety    Arthritis    Cholesteatoma of right ear    Diabetes mellitus ([place])     bordreline   Dysuria    Enlarged prostate    pt unaware   GERD (gastroesophageal reflux disease)    Headache    Hearing loss    Right ear   Hernia of abdominal wall    History of gross hematuria    History of head injury    History of kidney stones    History of palpitations    Hyperlipidemia 10/18/2014   Hypertension    Insomnia    Lack of concentration    Nasal fracture    Several   Otitis media 11/10/2017   Bilateral   Panic attacks    Syncope    Vertigo    Past Surgical History:  Procedure Laterality Date   CHOLECYSTECTOMY     CYSTOSCOPY N/A 05/10/2018   Procedure: CYSTOSCOPY WITH EXAM UNDER ANESTHESIA;  Surgeon: Ceasar Mons, MD;  Location[facility];  Service: Urology;  Laterality: N/A;  ONLY NEEDS 30 MIN   HYPOSPADIAS CORRECTION N/A 1978   INNER EAR SURGERY     right   LUMBAR [place] SURGERY     MASTOIDECTOMY Right    NOSE SURGERY     Fracture   tubes in bil ears     UPPER GASTROINTESTINAL ENDOSCOPY     UPPER GI ENDOSCOPY  01/09/2012   Esophageal dilation    reports that  he has never smoked. He has never used smokeless tobacco. He reports current alcohol use of about 5.0 standard drinks of alcohol per week. He reports that he does not use drugs. family history includes ADD / ADHD in his son; Anxiety disorder in his cousin, maternal aunt, maternal grandmother, maternal uncle, and mother; COPD in his mother; Cancer in his father; Diabetes in his maternal grandmother and paternal grandmother; Drug abuse in his son; Heart attack in his brother; Heart disease in his father; Hypertension in his mother; Lung cancer in his maternal uncle; OCD in his maternal aunt. No Known Allergies Current Outpatient Medications on File Prior to Visit  Medication Sig Dispense Refill   Ascorbic Acid (VITAMIN C PO) Take 500 mg by mouth daily.     aspirin EC 81 MG tablet Take 1 tablet (81 mg total) by mouth daily. 90 tablet 11   meloxicam (MOBIC) 15 MG tablet  Take 1 tablet (15 mg total) by mouth daily. 30 tablet 0   Multiple Vitamin (MULTIVITAMIN WITH MINERALS) TABS tablet Take 1 tablet by mouth daily.     Omega-3 Fatty Acids (FISH OIL PO) Take 1 capsule by mouth daily.     triamcinolone (NASACORT) 55 MCG/ACT AERO nasal inhaler Place 2 sprays into the nose daily. 3 Inhaler 3   No current facility-administered medications on file prior to visit.        ROS:  All others reviewed and negative.  Objective        PE:  BP 124/82 (BP Location: Right Arm, Patient Position: Sitting, Cuff Size: Normal)   Pulse 73   Temp 98.1 F (36.7 C) (Oral)   Ht 6' (1.829 m)   Wt 184 lb (83.5 kg)   SpO2 98%   BMI 24.95 kg/m                 Constitutional: Pt appears in NAD               HENT: Head: NCAT.                Right Ear: External ear normal.                 Left Ear: External ear normal. Bilat tm's with mild erythema.  Max sinus areas non tender.  Pharynx with mild erythema, no exudate               Eyes: . Pupils are equal, round, and reactive to light. Conjunctivae and EOM are normal               Nose: without d/c or deformity               Neck: Neck supple. Gross normal ROM               Cardiovascular: Normal rate and regular rhythm.                 Pulmonary/Chest: Effort normal and breath sounds without rales or wheezing.                Abd:  Soft, NT, ND, + BS, no organomegaly               Neurological: Pt is alert. At baseline orientation, motor grossly intact               Skin: Skin is warm. No rashes, no other new lesions, LE edema - none  Psychiatric: Pt behavior is normal without agitation   Micro: none  Cardiac tracings I have personally interpreted today:  none  Pertinent Radiological findings (summarize): none   Lab Results  Component Value Date   WBC 5.2 05/15/2021   HGB 14.3 05/15/2021   HCT 42.1 05/15/2021   PLT 239 05/15/2021   GLUCOSE 205 (H) 05/15/2021   CHOL 123 05/15/2021   TRIG 110 05/15/2021    HDL 46 05/15/2021   LDLDIRECT 102.0 11/23/2016   LDLCALC 55 05/15/2021   ALT 53 (H) 05/15/2021   AST 36 05/15/2021   NA 137 05/15/2021   K 4.2 05/15/2021   CL 104 05/15/2021   CREATININE 1.17 05/15/2021   BUN 9 05/15/2021   CO2 24 05/15/2021   TSH 1.840 05/15/2021   PSA 1.07 08/10/2018   INR 0.96 12/11/2017   HGBA1C 7.3 (A) 10/22/2020   MICROALBUR <0.7 10/08/2017   Assessment/Plan:  Jacob [name] is a 56 y.o. White or Caucasian [1] male with  has a past medical history of Allergic rhinitis, Anxiety, Arthritis, Cholesteatoma of right ear, Diabetes mellitus ([place]), Dysuria, Enlarged prostate, GERD (gastroesophageal reflux disease), Headache, Hearing loss, Hernia of abdominal wall, History of gross hematuria, History of head injury, History of kidney stones, History of palpitations, Hyperlipidemia (10/18/2014), Hypertension, Insomnia, Lack of concentration, Nasal fracture, Otitis media (11/10/2017), Panic attacks, Syncope, and Vertigo.  Encounter for well adult exam with abnormal findings Age and sex appropriate education and counseling updated with regular exercise and diet Referrals for preventative services - none needed Immunizations addressed - declines shingrix Smoking counseling  - none needed Evidence for depression or other mood disorder - none significant Most recent labs reviewed. I have personally reviewed and have noted: 1) the patient's medical and social history 2) The patient's current medications and supplements 3) The patient's height, weight, and BMI have been recorded in the chart   Insomnia Worsening, for refill xanax 1 mg qhs prn  Vertigo C/w peripheral - for meclizine prn  Chronic dysfunction of both eustachian tubes For mucinex bid prn  Allergic rhinitis Uncontrolled, to add allegra 180 qd prn  Panic attacks Uncontrolled, also for buspar 10 bid during day  Frequent nosebleeds Recent onset, left sided, for ENT referral  Hyperlipidemia Lab Results   Component Value Date   LDLCALC 55 05/15/2021   Stable, pt to continue current statin crestor 10 qd   Essential hypertension, benign BP Readings from Last 3 Encounters:  10/14/21 124/82  09/18/21 130/80  09/01/21 138/82   Stable, pt to continue medical treatment altace 10 qd   Diabetes mellitus due to underlying condition with hyperglycemia, without long-term current use of insulin (HCC) Lab Results  Component Value Date   HGBA1C 7.3 (A) 10/22/2020   Mild uncontrolled, pt to continue current medical treatment metformin ER 500 - 4 tab qd, declines add actos for now  Followup: Return in about 6 months (around 04/16/2022).  Cathlean Cower, MD 10/18/2021 5:22 PM [facility]

## 2021-10-14 NOTE — Telephone Encounter (Signed)
Rec'd determination it states This request has received a Favorable outcome from [facility] [place]. Effective from 10/14/2021 through 10/13/2022.Marland Kitchen Faxed approval to pof./lmb

## 2021-10-14 NOTE — Patient Instructions (Addendum)
Please take all new medication as prescribed - the meclizine as needed for vertigo  Please also add allegra for allergies, and mucinex for ears clogging as well  Ok for the buspar 10 mg twice per day, and the xanax at bedtime for sleep  Please continue all other medications as before, and refills have been done to the [facility] in [place]  Please have the pharmacy call with any other refills you may need.  Please continue your efforts at being more active, low cholesterol diet, and weight control.  You are otherwise up to date with prevention measures today.  Please keep your appointments with your specialists as you may have planned  You will be contacted regarding the referral for: ENT  - for nosebleeds  Please go to the LAB at the blood drawing area for the tests to be done  If the A1c is high, we would need to consider adding actos (generic)  You will be contacted by phone if any changes need to be made immediately.  Otherwise, you will receive a letter about your results with an explanation, but please check with MyChart first.  Please remember to sign up for MyChart if you have not done so, as this will be important to you in the future with finding out test results, communicating by private email, and scheduling acute appointments online when needed.  Please make an Appointment to return in 6 months, or sooner if needed

## 2021-10-15 ENCOUNTER — Other Ambulatory Visit: Payer: Self-pay | Admitting: Sports Medicine

## 2021-10-18 ENCOUNTER — Encounter: Payer: Self-pay | Admitting: Internal Medicine

## 2021-10-18 DIAGNOSIS — R040 Epistaxis: Secondary | ICD-10-CM | POA: Insufficient documentation

## 2021-10-18 NOTE — Assessment & Plan Note (Signed)
Recent onset, left sided, for ENT referral

## 2021-10-18 NOTE — Assessment & Plan Note (Signed)
Uncontrolled, also for buspar 10 bid during day

## 2021-10-18 NOTE — Assessment & Plan Note (Signed)
Age and sex appropriate education and counseling updated with regular exercise and diet Referrals for preventative services - none needed Immunizations addressed - declines shingrix Smoking counseling  - none needed Evidence for depression or other mood disorder - none significant Most recent labs reviewed. I have personally reviewed and have noted: 1) the patient's medical and social history 2) The patient's current medications and supplements 3) The patient's height, weight, and BMI have been recorded in the chart

## 2021-10-18 NOTE — Assessment & Plan Note (Signed)
Uncontrolled, to add allegra 180 qd prn

## 2021-10-18 NOTE — Assessment & Plan Note (Signed)
Lab Results  Component Value Date   HGBA1C 7.3 (A) 10/22/2020   Mild uncontrolled, pt to continue current medical treatment metformin ER 500 - 4 tab qd, declines add actos for now

## 2021-10-18 NOTE — Assessment & Plan Note (Signed)
C/w peripheral - for meclizine prn

## 2021-10-18 NOTE — Assessment & Plan Note (Signed)
Lab Results  Component Value Date   LDLCALC 55 05/15/2021   Stable, pt to continue current statin crestor 10 qd

## 2021-10-18 NOTE — Assessment & Plan Note (Signed)
Worsening, for refill xanax 1 mg qhs prn

## 2021-10-18 NOTE — Assessment & Plan Note (Signed)
For mucinex bid prn

## 2021-10-18 NOTE — Assessment & Plan Note (Signed)
BP Readings from Last 3 Encounters:  10/14/21 124/82  09/18/21 130/80  09/01/21 138/82   Stable, pt to continue medical treatment altace 10 qd

## 2021-11-10 ENCOUNTER — Ambulatory Visit: Payer: BC Managed Care – PPO | Admitting: Family Medicine

## 2021-11-10 VITALS — BP 130/80 | HR 103 | Ht 72.0 in | Wt 186.0 lb

## 2021-11-10 DIAGNOSIS — S46219A Strain of muscle, fascia and tendon of other parts of biceps, unspecified arm, initial encounter: Secondary | ICD-10-CM

## 2021-11-10 DIAGNOSIS — G8929 Other chronic pain: Secondary | ICD-10-CM | POA: Diagnosis not present

## 2021-11-10 DIAGNOSIS — M25511 Pain in right shoulder: Secondary | ICD-10-CM

## 2021-11-10 NOTE — Patient Instructions (Addendum)
Good to see you   You should hear from MRI scheduling within 1 week. If you do not hear please let me know.    We may do surgery based on MRI but we will know more soon.

## 2021-11-10 NOTE — Progress Notes (Signed)
    Subjective:     HA:LPFXT shoulder   HPI: patient is a 56 year old male complaining of right shoulder pain. Patient states that his shoulder is in pain and nothing is helping the subacromial injection didn't really help last time he was still in pain. No numbness tingling, his getting a painful lock and clicking in the shoulder.  He is done home exercise program for the shoulder since he was originally seen for this back in May of this year.    Pertinent review of Systems: No fevers or chills   Relevant historical information: Heavy duty work involving upper extremities.  Positive for diabetes.     Objective:    Vitals:   11/10/21 1111  BP: 130/80  Pulse: (!) 103  SpO2: 99%    General: Well Developed, well nourished, and in no acute distress.    MSK: Right shoulder: Positive Popeye deformity.  Otherwise normal-appearing. Nontender Normal range of motion. Pain with abduction. Intact strength. Positive Hawkins and Neer's test.  Positive empty can test. Positive Yergason's and speeds test  Lab and Radiology Results  Patient reports a normal shoulder x-ray at [facility] earlier this year.   EXAM: RIGHT SHOULDER - 2+ VIEW   COMPARISON:  None.   FINDINGS: No fracture or dislocation is seen.   Mild degenerative changes of the acromioclavicular joint.   The visualized soft tissues are unremarkable.   Visualized right lung is clear.   IMPRESSION: Negative.     Electronically Signed   By: Julian Hy M.D.   On: 11/11/2018 00:18  I, [name], personally (independently) visualized and performed the interpretation of the images attached in this note.    Impression and Recommendations:     Assessment and Plan: 56 y.o. male with right shoulder pain.  Pain due to subacromial bursitis and shoulder impingement.  May have a component of rotator cuff tendinitis or even a rotator cuff tear.  He had a trial of subacromial injection on Aug 08, 2021 and a home  exercise program taught in clinic by me at that time.  Since then his pain has returned and is interfering with his quality of life and ability to work normally.  At this point we will proceed to MRI to further characterize source of pain and for potential surgical planning.  Likely will refer directly to orthopedic surgery based on the results of the MRI as he is ready to proceed to surgery if possible.  To clarify.  Prior home exercise program for the shoulder was rotator cuff strengthening exercises to be performed for 20 minutes a day 4-7 days/week from Aug 08, 2021 until this visit.Marland Kitchen   PDMP not reviewed this encounter.      Discussed warning signs or symptoms. Please see discharge instructions. Patient expresses understanding.  The above documentation has been reviewed and is accurate and complete [name], M.D.

## 2021-11-12 ENCOUNTER — Telehealth: Payer: Self-pay

## 2021-11-12 NOTE — Telephone Encounter (Signed)
Patient called back. He said that Dr Georgina Snell gave him some exercises to do and he had tried them but it is so painful for him to do them. He is wanting to get the MRI to try to figure out what is going on. He states that the pain is so bad, he can barley function.

## 2021-11-12 NOTE — Telephone Encounter (Signed)
[facility] called back stating that OV notes and supporting documents were not received. She asked if these could be faxed to [phone].  Call back number is [phone]

## 2021-11-12 NOTE — Telephone Encounter (Signed)
Called [facility] to set up a peer to peer date and time with dr. Georgina Snell but it only let me leave a voicemail for a call back.   Patients reference ID number is [id number] Provider NPI [id number] Tax ID [id number] Patient ID number [id number]  This is for CPT code 03833 MRI of right shoulder Without contrast Diagnosis code is M25.511- chronic right shoulder pai S46.219A Biceps tendon tear.   This is to be done at [place] [id number]  Tax ID [id number] Address [address] [place] [address] Phone number [phone] Fax number [phone]   It was denied because the home exercise plan was not detailed enough for the procedure to be approved, There is no other information we can provide so Dr. Georgina Snell will need to do a peer to peer to discuss.   Was given a fax number if they inform us that an appeal is the way to go that is [phone] [employer] appeals.

## 2021-11-12 NOTE — Telephone Encounter (Signed)
I have faxed all the clinical documentation with addended note and the phone call where patient states how difficult it is to do the HEP. Waiting for reply from insurance.

## 2021-11-12 NOTE — Telephone Encounter (Signed)
Updated the note I will fax today.

## 2021-11-17 ENCOUNTER — Ambulatory Visit (INDEPENDENT_AMBULATORY_CARE_PROVIDER_SITE_OTHER): Payer: BC Managed Care – PPO

## 2021-11-17 DIAGNOSIS — S46219A Strain of muscle, fascia and tendon of other parts of biceps, unspecified arm, initial encounter: Secondary | ICD-10-CM

## 2021-11-17 DIAGNOSIS — G8929 Other chronic pain: Secondary | ICD-10-CM | POA: Diagnosis not present

## 2021-11-17 DIAGNOSIS — M25511 Pain in right shoulder: Secondary | ICD-10-CM

## 2021-11-19 ENCOUNTER — Telehealth: Payer: Self-pay | Admitting: Family Medicine

## 2021-11-19 ENCOUNTER — Encounter: Payer: Self-pay | Admitting: Family Medicine

## 2021-11-19 DIAGNOSIS — M75111 Incomplete rotator cuff tear or rupture of right shoulder, not specified as traumatic: Secondary | ICD-10-CM

## 2021-11-19 DIAGNOSIS — G8929 Other chronic pain: Secondary | ICD-10-CM

## 2021-11-19 DIAGNOSIS — S46219A Strain of muscle, fascia and tendon of other parts of biceps, unspecified arm, initial encounter: Secondary | ICD-10-CM

## 2021-11-19 NOTE — Progress Notes (Signed)
Right shoulder MRI shows a rotator cuff tear with 2 cm of retraction of the supraspinatus rotator cuff tendon.  This is likely the source of pain and will likely require surgery to resolve completely. I have referred you to orthopedic surgery to talk about your surgery options.  You should hear from [facility] care soon.  Please let me know if you do not hear anything. You do also have some tendinitis of the rotator cuff tendons and a what looks like a biceps tendon tear which are also problems which could be addressed at the same time. If you have any questions or concerns please let me know.  Okay to schedule follow-up appointment with me to talk about this in full detail if needed however I think surgery is probably your best option.

## 2021-11-19 NOTE — Telephone Encounter (Signed)
Jacob [name] called today asking about his MRI results before I had a chance to result them to him.  I did discussed verbally with him the results of his MRI and plan.  He notes that he is having quite a bit of pain and cannot work.  I have written him out of work until he can get in with an orthopedic surgeon to discuss surgery options.  I do not think another cortisone shot makes a lot of sense especially if surgery is a potential option.

## 2021-11-19 NOTE — Telephone Encounter (Signed)
Refer to orthopedic surgery

## 2021-11-20 ENCOUNTER — Encounter: Payer: Self-pay | Admitting: Orthopedic Surgery

## 2021-11-20 ENCOUNTER — Ambulatory Visit: Payer: BC Managed Care – PPO | Admitting: Orthopedic Surgery

## 2021-11-20 DIAGNOSIS — M75121 Complete rotator cuff tear or rupture of right shoulder, not specified as traumatic: Secondary | ICD-10-CM

## 2021-11-20 NOTE — Progress Notes (Signed)
Office Visit Note   Patient: Jacob [name]           Date of Birth: 08-14-1965           MRN: [id number] Visit Date: 11/20/2021 Requested by: Gregor Hams, MD [place],  [place] [id number] PCP: Biagio Borg, MD  Subjective: Chief Complaint  Patient presents with   Right Shoulder - Pain    HPI: Jacob [name] is a 56 year old patient with right shoulder pain.  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  He may be between jobs in the near future.  He has worked many hours leading up to this event.  States that his biceps tendon "fell" about 7 months ago.  Has about 1 to 2 injections/year the last 1 being 3 months ago in the right shoulder.  Hurts in her throat.              ROS: All systems reviewed are negative as they relate to the chief complaint within the history of present illness.  Patient denies  fevers or chills.   Assessment & Plan: Visit Diagnoses:  1. Nontraumatic complete tear of right rotator cuff     Plan: Impression is right shoulder rotator cuff tear with retraction.  Biceps tendon is already in the morning and is retracted.  No intervention required for that.  However due to examination very physical think he needs very rotator cuff tears.  Indicated particularly for reverse [place] repair.  Risk and benefits of surgery are discussed with the patient to infection or vessel damage shoulder stiffness as well as incomplete pain relief and incomplete restoration of function.  He does have slight weakness on exam today.  Plan to use CPM brace after surgery to facilitate return of motion.  He is interested in the quickest rehabilitation possible but I did discuss with him about pathology and tendon bone healing as well as the time it would take for him to strengthen his arm back up to achieve a functional level.  Patient understands risk benefits and wishes to proceed as soon as possible.  We will try to get surgery next week.  Questions answered  Follow-Up  Instructions: No follow-ups on file.   Orders:  No orders of the defined types were placed in this encounter.  No orders of the defined types were placed in this encounter.     Procedures: No procedures performed   Clinical Data: No additional findings.  Objective: Vital Signs: There were no vitals taken for this visit.  Physical Exam:   Constitutional: Patient appears well-developed HEENT:  Head: Normocephalic Eyes:EOM are normal Neck: Normal range of motion Cardiovascular: Normal rate Pulmonary/chest: Effort normal Neurologic: Patient is alert Skin: Skin is warm Psychiatric: Patient has normal mood and affect   Ortho Exam: Ortho exam demonstrates passive range of motion bilateral shoulders of 30/95/155.  Rotator cuff strength is 5-5 on the right to external rotation.  5+ out of 5 on the left.  No discrete AC joint tenderness is present.  Subscap strength 5+ out of 5 bilaterally.  Popeye deformity present on the right and on the left.  Slight crepitus on the right-hand side with internal/external rotation of the arm.  No other masses lymphadenopathy or skin changes noted in the right shoulder girdle region  Specialty Comments:  No specialty comments available.  Imaging: No results found.   PMFS History: Patient Active Problem List   Diagnosis Date Noted   Frequent  nosebleeds 10/18/2021   Diabetes mellitus due to underlying condition with hyperglycemia, without long-term current use of insulin ([place]) 09/01/2021   Weakness 09/01/2021   MDD (major depressive disorder) 05/15/2021   Excessive cerumen in ear canal, right 05/12/2021   History of right mastoidectomy 02/03/2021   Mixed conductive and sensorineural hearing loss of right ear with restricted hearing of left ear 02/03/2021   Insomnia 07/24/2020   Grief 06/02/2020   Right rotator cuff tear 10/27/2019   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   Sleep disorder 08/23/2018   Chronic dysfunction  of both eustachian tubes 08/15/2018   Memory loss 08/11/2018   Dysuria 04/22/2018   Gross hematuria 04/22/2018   STD exposure 04/22/2018   Lack of concentration 12/21/2017   Panic attacks 12/21/2017   Obsessional thoughts 12/21/2017   Acute sinus infection 06/11/2017   Screening for colorectal cancer 04/09/2017   Nausea without vomiting 04/09/2017   Dysphagia 04/09/2017   Dyspepsia 11/19/2016   Right otitis media 09/09/2016   Vertigo 09/09/2016   Hearing loss, right 09/09/2016   BPH (benign prostatic hyperplasia) 05/30/2016   Right shoulder pain 05/30/2016   Acute midline low back pain with sciatica 04/21/2016   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 04/21/2016   Urinary frequency 02/05/2016   Syncope 09/27/2015   Left lateral epicondylitis 08/31/2015   Allergic rhinitis 08/29/2015   Hearing loss 06/05/2015   Hypersomnolence 06/05/2015   Chest pain 01/23/2015   Chronic pain syndrome 01/23/2015   Hyperlipidemia 10/18/2014   Impingement syndrome of right shoulder 06/28/2014   Hip flexor tendon tightness 06/28/2014   Right knee pain 06/28/2014   Depression with anxiety 01/30/2013   Encounter for well adult exam with abnormal findings 01/11/2013   Essential hypertension, benign 01/11/2013   Low back pain syndrome 01/11/2013   DJD (degenerative joint disease) 01/11/2013   Erectile dysfunction 01/11/2013   Gastroesophageal reflux disease 12/30/2011   Past Medical History:  Diagnosis Date   Allergic rhinitis    Anxiety    Arthritis    Cholesteatoma of right ear    Diabetes mellitus ([place])    bordreline   Dysuria    Enlarged prostate    pt unaware   GERD (gastroesophageal reflux disease)    Headache    Hearing loss    Right ear   Hernia of abdominal wall    History of gross hematuria    History of head injury    History of kidney stones    History of palpitations    Hyperlipidemia 10/18/2014   Hypertension    Insomnia    Lack of concentration    Nasal  fracture    Several   Otitis media 11/10/2017   Bilateral   Panic attacks    Syncope    Vertigo     Family History  Problem Relation Age of Onset   COPD Mother    Hypertension Mother    Anxiety disorder Mother    Heart disease Father    Cancer Father    Diabetes Paternal Grandmother    Diabetes Maternal Grandmother    Anxiety disorder Maternal Grandmother    Heart attack Brother    Lung cancer Maternal Uncle    Anxiety disorder Maternal Uncle    Anxiety disorder Maternal Aunt    OCD Maternal Aunt    Anxiety disorder Cousin    ADD / ADHD Son    Drug abuse Son    Colon cancer Neg Hx    Esophageal  cancer Neg Hx    Rectal cancer Neg Hx    Stomach cancer Neg Hx     Past Surgical History:  Procedure Laterality Date   CHOLECYSTECTOMY     CYSTOSCOPY N/A 05/10/2018   Procedure: CYSTOSCOPY WITH EXAM UNDER ANESTHESIA;  Surgeon: Ceasar Mons, MD;  Location: [facility];  Service: Urology;  Laterality: N/A;  ONLY NEEDS 30 MIN   HYPOSPADIAS CORRECTION N/A 1978   INNER EAR SURGERY     right   LUMBAR DISC SURGERY     MASTOIDECTOMY Right    NOSE SURGERY     Fracture   tubes in bil ears     UPPER GASTROINTESTINAL ENDOSCOPY     UPPER GI ENDOSCOPY  01/09/2012   Esophageal dilation   Social History   Occupational History   Occupation: Music therapist: [employer]  Tobacco Use   Smoking status: Never   Smokeless tobacco: Never  Vaping Use   Vaping Use: Never used  Substance and Sexual Activity   Alcohol use: Yes    Alcohol/week: 5.0 standard drinks of alcohol    Types: 5 Glasses of wine per week    Comment: weekends   Drug use: No   Sexual activity: Yes    Partners: Female    Birth control/protection: None

## 2021-11-21 ENCOUNTER — Telehealth: Payer: Self-pay | Admitting: Orthopedic Surgery

## 2021-11-21 ENCOUNTER — Ambulatory Visit: Payer: Self-pay

## 2021-11-21 ENCOUNTER — Ambulatory Visit: Payer: BC Managed Care – PPO | Admitting: Family Medicine

## 2021-11-21 VITALS — BP 130/80 | HR 100 | Ht 72.0 in | Wt 186.0 lb

## 2021-11-21 DIAGNOSIS — M25511 Pain in right shoulder: Secondary | ICD-10-CM | POA: Diagnosis not present

## 2021-11-21 DIAGNOSIS — G8929 Other chronic pain: Secondary | ICD-10-CM | POA: Diagnosis not present

## 2021-11-21 NOTE — Progress Notes (Signed)
I, [name], LAT, ATC acting as a scribe for [name], MD.  Jacob [name] is a 56 y.o. male who presents to [place] at [facility] today for f/u R shoulder RC complete tear and post-surgical consultation. Pt was last seen by Dr. Georgina Snell on 11/10/21 he was advised to proceed to MRI. Based on MRI findings, pt was referred to [facility] and had a consultation w/ Dr. Marlou Sa on 11/20/21. Pt is scheduled for surgery on 11/24/21. Today, pt reports is old job was planning to lay him off when he told them he was going to need shoulder surgery. 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 Pt notes his new job has much better benefits as well. Pt is wanting to push back surgery for 1.5 years. Pt notes much benefit from prior steroid injections and would like a repeat.   Dx imaging: 11/17/21 R shoulder MRI  Pertinent review of systems: No fevers or chills  Relevant historical information: Diabetes   Exam:  BP 130/80   Pulse 100   Ht 6' (1.829 m)   Wt 186 lb (84.4 kg)   SpO2 99%   BMI 25.23 kg/m  General: Well Developed, well nourished, and in no acute distress.   MSK: Right shoulder: Normal-appearing Pain with abduction.  Intact range of motion.    Lab and Radiology Results  Procedure: Real-time Ultrasound Guided Injection of right shoulder subacromial bursa Device: Philips Affiniti 50G Images permanently stored and available for review in PACS Verbal informed consent obtained.  Discussed risks and benefits of procedure. Warned about infection, bleeding, hyperglycemia damage to structures among others. Patient expresses understanding and agreement Time-out conducted.   Noted no overlying erythema, induration, or other signs of local infection.   Skin prepped in a sterile fashion.   Local anesthesia: Topical Ethyl chloride.   With sterile technique and under real time ultrasound guidance: 40 mg of  Kenalog and 2 mL of Marcaine injected into subacromial bursa. Fluid seen entering the bursa.   Completed without difficulty   Pain immediately resolved suggesting accurate placement of the medication.   Advised to call if fevers/chills, erythema, induration, drainage, or persistent bleeding.   Images permanently stored and available for review in the ultrasound unit.  Impression: Technically successful ultrasound guided injection.   EXAM: MRI OF THE RIGHT SHOULDER WITHOUT CONTRAST   TECHNIQUE: Multiplanar, multisequence MR imaging of the shoulder was performed. No intravenous contrast was administered.   COMPARISON:  None Available.   FINDINGS: Rotator cuff: Moderate tendinosis of the supraspinatus tendon with a large 17 mm full-thickness tear and 2 cm of retraction. Mild tendinosis of the infraspinatus tendon. Teres minor tendon is intact. Mild tendinosis of the subscapularis tendon.   Muscles: No muscle atrophy or edema. No intramuscular fluid collection or hematoma.   Biceps Long Head: High-grade partial-thickness versus complete tear of the intra-articular portion of the long head of biceps tendon.   Acromioclavicular Joint: Moderate arthropathy of the acromioclavicular joint. Small amount of subacromial/subdeltoid bursal fluid.   Glenohumeral Joint: No joint effusion. Partial-thickness cartilage loss of the glenohumeral joint.   Labrum: Grossly intact, but evaluation is limited by lack of intraarticular fluid/contrast.   Bones: No fracture or dislocation. No aggressive osseous lesion.   Other: No fluid collection or hematoma.   IMPRESSION: 1. Moderate tendinosis of the supraspinatus tendon with a large 17 mm full-thickness tear and 2 cm of retraction.  2. Mild tendinosis of the infraspinatus tendon. 3. Mild tendinosis of the subscapularis tendon. 4. High-grade partial-thickness versus complete tear of the intra-articular portion of the long head of biceps  tendon.     Electronically Signed   By: Kathreen Devoid M.D.   On: 11/19/2021 07:23   I, [name], personally (independently) visualized and performed the interpretation of the images attached in this note.      Assessment and Plan: 56 y.o. male with right shoulder pain due to rotator cuff tear supraspinatus tendon.  The original plan was for surgery but after he discussed his options with Dr. Marlou Sa and thought about his work options he would like to delay surgery for a little while longer.  He is going to switch back to previous job which is less physically demanding and offers better benefits but slightly less pain.  He really cannot have surgery right now if he does do a job switch.  Therefore he would like to continue conservative management including injection.  This is a bit of change in plan from what we developed prior to and following his MRI.  I can repeat the steroid injection every 3 months.  Recheck as needed.   PDMP not reviewed this encounter. Orders Placed This Encounter  Procedures   [place] LIMITED JOINT SPACE STRUCTURES UP RIGHT(NO LINKED CHARGES)    Order Specific Question:   Reason for Exam (SYMPTOM  OR DIAGNOSIS REQUIRED)    Answer:   right shoulder pain    Order Specific Question:   Preferred imaging location?    Answer:   [place]   No orders of the defined types were placed in this encounter.    Discussed warning signs or symptoms. Please see discharge instructions. Patient expresses understanding.   The above documentation has been reviewed and is accurate and complete [name], M.D.

## 2021-11-21 NOTE — Patient Instructions (Addendum)
Thank you for coming in today.   Let Dr Marlou Sa know that you are not doing surgery.   We can do this shot every 3 month.

## 2021-11-21 NOTE — Telephone Encounter (Signed)
Well.  That is definitely not in his best interest and I discussed with him how waiting could make the tear unrepairable.  Thanks for letting me know.

## 2021-11-21 NOTE — Telephone Encounter (Signed)
Patient left a message saying he needed his beach trip this weekend and would not be able to have surgery on Monday.  Surgery for his right shoulder arthroscopy with rotator cuff tear repair was moved to accommodate patient for Thursday 11-27-21 at [facility].  I called patient to let him know the details for surgery had been sent to his e-mail and a post op appointment had been made. Patient said he has decided not to have surgery and would like to put it off for one year.

## 2021-11-25 ENCOUNTER — Ambulatory Visit: Payer: BC Managed Care – PPO | Admitting: Orthopaedic Surgery

## 2021-11-27 ENCOUNTER — Ambulatory Visit: Admit: 2021-11-27 | Payer: BC Managed Care – PPO | Admitting: Orthopedic Surgery

## 2021-11-27 SURGERY — SHOULDER ARTHROSCOPY WITH OPEN ROTATOR CUFF REPAIR AND DISTAL CLAVICLE ACROMINECTOMY
Anesthesia: General | Site: Shoulder | Laterality: Right

## 2021-12-05 ENCOUNTER — Encounter: Payer: BC Managed Care – PPO | Admitting: Surgical

## 2021-12-08 ENCOUNTER — Ambulatory Visit: Payer: BC Managed Care – PPO | Admitting: Internal Medicine

## 2021-12-10 ENCOUNTER — Encounter: Payer: Self-pay | Admitting: Internal Medicine

## 2021-12-10 ENCOUNTER — Ambulatory Visit (INDEPENDENT_AMBULATORY_CARE_PROVIDER_SITE_OTHER): Payer: 59 | Admitting: Internal Medicine

## 2021-12-10 ENCOUNTER — Other Ambulatory Visit (INDEPENDENT_AMBULATORY_CARE_PROVIDER_SITE_OTHER): Payer: 59

## 2021-12-10 VITALS — BP 144/86 | HR 86 | Temp 98.8°F | Ht 72.0 in | Wt 188.0 lb

## 2021-12-10 DIAGNOSIS — E538 Deficiency of other specified B group vitamins: Secondary | ICD-10-CM

## 2021-12-10 DIAGNOSIS — Z125 Encounter for screening for malignant neoplasm of prostate: Secondary | ICD-10-CM

## 2021-12-10 DIAGNOSIS — E0865 Diabetes mellitus due to underlying condition with hyperglycemia: Secondary | ICD-10-CM | POA: Diagnosis not present

## 2021-12-10 DIAGNOSIS — Z0001 Encounter for general adult medical examination with abnormal findings: Secondary | ICD-10-CM

## 2021-12-10 DIAGNOSIS — E119 Type 2 diabetes mellitus without complications: Secondary | ICD-10-CM | POA: Diagnosis not present

## 2021-12-10 DIAGNOSIS — N390 Urinary tract infection, site not specified: Secondary | ICD-10-CM | POA: Diagnosis not present

## 2021-12-10 DIAGNOSIS — I10 Essential (primary) hypertension: Secondary | ICD-10-CM

## 2021-12-10 DIAGNOSIS — R208 Other disturbances of skin sensation: Secondary | ICD-10-CM

## 2021-12-10 DIAGNOSIS — R079 Chest pain, unspecified: Secondary | ICD-10-CM | POA: Diagnosis not present

## 2021-12-10 DIAGNOSIS — E559 Vitamin D deficiency, unspecified: Secondary | ICD-10-CM

## 2021-12-10 LAB — POCT GLYCOSYLATED HEMOGLOBIN (HGB A1C): Hemoglobin A1C: 8.4 % — AB (ref 4.0–5.6)

## 2021-12-10 LAB — BASIC METABOLIC PANEL
BUN: 17 mg/dL (ref 6–23)
CO2: 27 mEq/L (ref 19–32)
Calcium: 9.3 mg/dL (ref 8.4–10.5)
Chloride: 102 mEq/L (ref 96–112)
Creatinine, Ser: 1.02 mg/dL (ref 0.40–1.50)
GFR: 83.2 mL/min (ref >60.00)
Glucose, Bld: 172 mg/dL — ABNORMAL HIGH (ref 70–99)
Potassium: 4.4 mEq/L (ref 3.5–5.1)
Sodium: 137 mEq/L (ref 135–145)

## 2021-12-10 LAB — POCT URINALYSIS DIPSTICK
Bilirubin, UA: NEGATIVE
Glucose, UA: POSITIVE — AB
Ketones, UA: NEGATIVE
Leukocytes, UA: NEGATIVE
Nitrite, UA: NEGATIVE
Protein, UA: POSITIVE — AB
Spec Grav, UA: 1.02 (ref 1.010–1.025)
Urobilinogen, UA: 0.2 E.U./dL
pH, UA: 6 (ref 5.0–8.0)

## 2021-12-10 LAB — HEPATIC FUNCTION PANEL
ALT: 44 U/L (ref 0–53)
AST: 28 U/L (ref 0–37)
Albumin: 4 g/dL (ref 3.5–5.2)
Alkaline Phosphatase: 68 U/L (ref 39–117)
Bilirubin, Direct: 0.1 mg/dL (ref 0.0–0.3)
Total Bilirubin: 0.4 mg/dL (ref 0.2–1.2)
Total Protein: 6.9 g/dL (ref 6.0–8.3)

## 2021-12-10 LAB — LIPID PANEL
Cholesterol: 140 mg/dL (ref 0–200)
HDL: 57.3 mg/dL (ref >39.00)
LDL Cholesterol: 60 mg/dL (ref 0–99)
NonHDL: 82.2
Total CHOL/HDL Ratio: 2
Triglycerides: 109 mg/dL (ref 0.0–149.0)
VLDL: 21.8 mg/dL (ref 0.0–40.0)

## 2021-12-10 MED ORDER — METFORMIN HCL ER 500 MG PO TB24: ORAL_TABLET | ORAL | 3 refills | Status: DC

## 2021-12-10 MED ORDER — DAPAGLIFLOZIN PROPANEDIOL 10 MG PO TABS: 10.0000 mg | ORAL_TABLET | Freq: Every day | ORAL | 3 refills | Status: DC

## 2021-12-10 NOTE — Progress Notes (Signed)
    Lab Results  Component Value Date   PSA 1.07 08/10/2018   PSA 1.05 10/08/2017   PSA 1.16 11/23/2016

## 2021-12-10 NOTE — Progress Notes (Signed)
Patient ID: Jacob [name], male   DOB: December 28, 1965, 56 y.o.   MRN: [id number]         Chief Complaint:: wellness exam and DM, recent UTI at UC, chest pain       HPI:  Jacob [name] is a 56 y.o. male here for wellness exam; due for flu shot, decliens shingrix o/w up to date                       Also Denies urinary symptoms such as dysuria, frequency, urgency, flank pain, hematuria or n/v, fever, chills, after finishing antibx for UTI per UC  Pt denies increased sob or doe, wheezing, orthopnea, PND, increased LE swelling, palpitations, dizziness or syncope, but has had recurring dull pressure like chest pains mild intermittent last wk, not sure if related to work but has new job that is not as physical.   Pt denies polydipsia, polyuria, or new focal neuro s/s.    Pt denies fever, wt loss, night sweats, loss of appetite, or other constitutional symptoms     Wt Readings from Last 3 Encounters:  12/10/21 188 lb (85.3 kg)  11/21/21 186 lb (84.4 kg)  11/10/21 186 lb (84.4 kg)   BP Readings from Last 3 Encounters:  12/10/21 (!) 144/86  11/21/21 130/80  11/10/21 130/80   Immunization History  Administered Date(s) Administered   Influenza Inj Mdck Quad Pf 12/24/2018   Influenza,inj,Quad PF,6+ Mos 01/11/2013, 11/19/2016, 11/26/2017, 01/22/2020   Influenza-Unspecified 12/29/2015, 01/15/2021   Janssen (J&J) SARS-COV-2 Vaccination 10/04/2019   PFIZER(Purple Top)SARS-COV-2 Vaccination 03/09/2020   Pneumococcal Polysaccharide-23 01/11/2013   Tdap 06/21/2001, 09/22/2012   Health Maintenance Due  Topic Date Due   Diabetic kidney evaluation - Urine ACR  10/09/2018   INFLUENZA VACCINE  10/28/2021      Past Medical History:  Diagnosis Date   Allergic rhinitis    Anxiety    Arthritis    Cholesteatoma of right ear    Diabetes mellitus ([place])    bordreline   Dysuria    Enlarged prostate    pt unaware   GERD (gastroesophageal reflux disease)    Headache    Hearing loss    Right ear    Hernia of abdominal wall    History of gross hematuria    History of head injury    History of kidney stones    History of palpitations    Hyperlipidemia 10/18/2014   Hypertension    Insomnia    Lack of concentration    Nasal fracture    Several   Otitis media 11/10/2017   Bilateral   Panic attacks    Syncope    Vertigo    Past Surgical History:  Procedure Laterality Date   CHOLECYSTECTOMY     CYSTOSCOPY N/A 05/10/2018   Procedure: CYSTOSCOPY WITH EXAM UNDER ANESTHESIA;  Surgeon: Ceasar Mons, MD;  Location: [facility];  Service: Urology;  Laterality: N/A;  ONLY NEEDS 30 MIN   HYPOSPADIAS CORRECTION N/A 1978   INNER EAR SURGERY     right   LUMBAR [place] SURGERY     MASTOIDECTOMY Right    NOSE SURGERY     Fracture   tubes in bil ears     UPPER GASTROINTESTINAL ENDOSCOPY     UPPER GI ENDOSCOPY  01/09/2012   Esophageal dilation    reports that he has never smoked. He has never used smokeless tobacco. He reports current alcohol use of about  5.0 standard drinks of alcohol per week. He reports that he does not use drugs. family history includes ADD / ADHD in his son; Anxiety disorder in his cousin, maternal aunt, maternal grandmother, maternal uncle, and mother; COPD in his mother; Cancer in his father; Diabetes in his maternal grandmother and paternal grandmother; Drug abuse in his son; Heart attack in his brother; Heart disease in his father; Hypertension in his mother; Lung cancer in his maternal uncle; OCD in his maternal aunt. No Known Allergies Current Outpatient Medications on File Prior to Visit  Medication Sig Dispense Refill   ALPRAZolam (XANAX) 0.5 MG tablet Take 1-2 tablets (0.5-1 mg total) by mouth at bedtime as needed. 60 tablet 5   Ascorbic Acid (VITAMIN C PO) Take 500 mg by mouth daily.     aspirin EC 81 MG tablet Take 1 tablet (81 mg total) by mouth daily. 90 tablet 11   busPIRone (BUSPAR) 10 MG tablet Take 1 tablet (10 mg total) by mouth  2 (two) times daily. 180 tablet 3   meclizine (ANTIVERT) 12.5 MG tablet Take 1 tablet (12.5 mg total) by mouth 3 (three) times daily as needed for dizziness. 60 tablet 1   meloxicam (MOBIC) 15 MG tablet Take 1 tablet (15 mg total) by mouth daily. 30 tablet 0   Multiple Vitamin (MULTIVITAMIN WITH MINERALS) TABS tablet Take 1 tablet by mouth daily.     Omega-3 Fatty Acids (FISH OIL PO) Take 1 capsule by mouth daily.     pantoprazole (PROTONIX) 40 MG tablet Take 1 tablet (40 mg total) by mouth 2 (two) times daily. 180 tablet 3   ramipril (ALTACE) 10 MG capsule Take 1 capsule (10 mg total) by mouth daily. 90 capsule 3   rosuvastatin (CRESTOR) 10 MG tablet Take 1 tablet (10 mg total) by mouth daily. 90 tablet 3   tadalafil (CIALIS) 20 MG tablet Take 1 tablet (20 mg total) by mouth daily as needed. 8 tablet 7   triamcinolone (NASACORT) 55 MCG/ACT AERO nasal inhaler Place 2 sprays into the nose daily. 3 Inhaler 3   No current facility-administered medications on file prior to visit.        ROS:  All others reviewed and negative.  Objective        PE:  BP (!) 144/86   Pulse 86   Temp 98.8 F (37.1 C)   Ht 6' (1.829 m)   Wt 188 lb (85.3 kg)   SpO2 98%   BMI 25.50 kg/m                 Constitutional: Pt appears in NAD               HENT: Head: NCAT.                Right Ear: External ear normal.                 Left Ear: External ear normal.                Eyes: . Pupils are equal, round, and reactive to light. Conjunctivae and EOM are normal               Nose: without d/c or deformity               Neck: Neck supple. Gross normal ROM               Cardiovascular: Normal rate and regular rhythm.  Pulmonary/Chest: Effort normal and breath sounds without rales or wheezing.                Abd:  Soft, NT, ND, + BS, no organomegaly               Neurological: Pt is alert. At baseline orientation, motor grossly intact               Skin: Skin is warm. No rashes, no other new  lesions, LE edema - none               Psychiatric: Pt behavior is normal without agitation   Micro: none  Cardiac tracings I have personally interpreted today:  none  Pertinent Radiological findings (summarize): none   Lab Results  Component Value Date   WBC 5.8 12/10/2021   HGB 13.7 12/10/2021   HCT 40.4 12/10/2021   PLT 240.0 12/10/2021   GLUCOSE 172 (H) 12/10/2021   CHOL 140 12/10/2021   TRIG 109.0 12/10/2021   HDL 57.30 12/10/2021   LDLDIRECT 102.0 11/23/2016   LDLCALC 60 12/10/2021   ALT 44 12/10/2021   AST 28 12/10/2021   NA 137 12/10/2021   K 4.4 12/10/2021   CL 102 12/10/2021   CREATININE 1.02 12/10/2021   BUN 17 12/10/2021   CO2 27 12/10/2021   TSH 1.45 12/10/2021   PSA 7.27 (H) 12/10/2021   INR 0.96 12/11/2017   HGBA1C 8.4 (A) 12/10/2021   MICROALBUR <0.7 10/08/2017   Component Ref Range & Units 16:43 (12/10/21) 3 yr ago (08/10/18) 3 yr ago (04/22/18) 3 yr ago (04/22/18) 4 yr ago (10/08/17) 4 yr ago (09/17/17) 5 yr ago (11/23/16)  Color, UA  yellow    yelow      Clarity, UA  clear    clear      Glucose, UA Negative Positive Abnormal     Negative      Bilirubin, UA  neg    neg      Ketones, UA  neg    neg      Spec Grav, UA 1.010 - 1.025 1.020    1.015      Blood, UA  +    3+      pH, UA 5.0 - 8.0 6.0    6.0      Protein, UA Negative Positive Abnormal     Negative      Urobilinogen, UA 0.2 or 1.0 E.U./dL 0.2  0.2 R  0.2 R  0.2  0.2 R   0.2 R   Nitrite, UA  NEG    neg      Leukocytes, UA Negative Negative          Assessment/Plan:  Jacob [name] is a 56 y.o. White or Caucasian [1] male with  has a past medical history of Allergic rhinitis, Anxiety, Arthritis, Cholesteatoma of right ear, Diabetes mellitus ([place]), Dysuria, Enlarged prostate, GERD (gastroesophageal reflux disease), Headache, Hearing loss, Hernia of abdominal wall, History of gross hematuria, History of head injury, History of kidney stones, History of palpitations, Hyperlipidemia (10/18/2014),  Hypertension, Insomnia, Lack of concentration, Nasal fracture, Otitis media (11/10/2017), Panic attacks, Syncope, and Vertigo.  Encounter for well adult exam with abnormal findings Age and sex appropriate education and counseling updated with regular exercise and diet Referrals for preventative services - none needed Immunizations addressed - for flu shot Smoking counseling  - none needed Evidence for depression or other mood disorder - chronic anxiety stable, no panic Most  recent labs reviewed. I have personally reviewed and have noted: 1) the patient's medical and social history 2) The patient's current medications and supplements 3) The patient's height, weight, and BMI have been recorded in the chart   Diabetes mellitus due to underlying condition with hyperglycemia, without long-term current use of insulin ([place]) Lab Results  Component Value Date   HGBA1C 8.4 (A) 12/10/2021   Uncontrolled,, pt to continue current medical treatment metformin ER 500 mg - 4 qd, and add farxiga 5 mg qd   Essential hypertension, benign BP Readings from Last 3 Encounters:  12/10/21 (!) 144/86  11/21/21 130/80  11/10/21 130/80   Uncontrolled, states BP at home < 140/90 , pt to continue medical treatment altace 10 mg qd - declines other change for now    Chest pain Atypical, pt unable for ecg due to time contraint today has to leave, but for stress test as per pt request  UTI (urinary tract infection) tx for recent UTI, asympt, exam benign, udip neg,  to f/u any worsening symptoms or concerns   Followup: Return in about 3 months (around 03/11/2022).  Cathlean Cower, MD 12/13/2021 7:16 PM [facility]

## 2021-12-10 NOTE — Patient Instructions (Addendum)
Your a1c was 8.4 today  You had the flu shot today  Please take all new medication as prescribed -the farxiga 10 mg per day  Please continue all other medications as before, including the metformin you already take  Please have the pharmacy call with any other refills you may need.  Please continue your efforts at being more active, low cholesterol diet, and weight control.  You are otherwise up to date with prevention measures today.  Please keep your appointments with your specialists as you may have planned  You will be contacted regarding the referral for: Stress test  Please go to the LAB at the blood drawing area for the tests to be done = at the [place] will be contacted by phone if any changes need to be made immediately.  Otherwise, you will receive a letter about your results with an explanation, but please check with MyChart first.  Please remember to sign up for MyChart if you have not done so, as this will be important to you in the future with finding out test results, communicating by private email, and scheduling acute appointments online when needed.  Please make an Appointment to return in 3 months, or sooner if needed

## 2021-12-11 ENCOUNTER — Other Ambulatory Visit: Payer: Self-pay | Admitting: Internal Medicine

## 2021-12-11 DIAGNOSIS — R972 Elevated prostate specific antigen [PSA]: Secondary | ICD-10-CM

## 2021-12-11 LAB — CBC WITH DIFFERENTIAL/PLATELET
Basophils Absolute: 0 10*3/uL (ref 0.0–0.1)
Basophils Relative: 0.5 % (ref 0.0–3.0)
Eosinophils Absolute: 0.1 10*3/uL (ref 0.0–0.7)
Eosinophils Relative: 1.3 % (ref 0.0–5.0)
HCT: 40.4 % (ref 39.0–52.0)
Hemoglobin: 13.7 g/dL (ref 13.0–17.0)
Lymphocytes Relative: 29.9 % (ref 12.0–46.0)
Lymphs Abs: 1.7 10*3/uL (ref 0.7–4.0)
MCHC: 34 g/dL (ref 30.0–36.0)
MCV: 89.2 fl (ref 78.0–100.0)
Monocytes Absolute: 0.5 10*3/uL (ref 0.1–1.0)
Monocytes Relative: 9.4 % (ref 3.0–12.0)
Neutro Abs: 3.4 10*3/uL (ref 1.4–7.7)
Neutrophils Relative %: 58.9 % (ref 43.0–77.0)
Platelets: 240 10*3/uL (ref 150.0–400.0)
RBC: 4.53 Mil/uL (ref 4.22–5.81)
RDW: 13.6 % (ref 11.5–15.5)
WBC: 5.8 10*3/uL (ref 4.0–10.5)

## 2021-12-11 LAB — PSA: PSA: 7.27 ng/mL — ABNORMAL HIGH (ref 0.10–4.00)

## 2021-12-11 LAB — TSH: TSH: 1.45 u[IU]/mL (ref 0.35–5.50)

## 2021-12-11 LAB — VITAMIN B12: Vitamin B-12: 201 pg/mL — ABNORMAL LOW (ref 211–911)

## 2021-12-11 LAB — VITAMIN D 25 HYDROXY (VIT D DEFICIENCY, FRACTURES): VITD: 38.26 ng/mL (ref 30.00–100.00)

## 2021-12-11 MED ORDER — VITAMIN B-12 1000 MCG PO TABS: 1000.0000 ug | ORAL_TABLET | Freq: Every day | ORAL | 1 refills | Status: AC

## 2021-12-13 ENCOUNTER — Encounter: Payer: Self-pay | Admitting: Internal Medicine

## 2021-12-13 DIAGNOSIS — N390 Urinary tract infection, site not specified: Secondary | ICD-10-CM | POA: Insufficient documentation

## 2021-12-13 NOTE — Assessment & Plan Note (Signed)
Age and sex appropriate education and counseling updated with regular exercise and diet Referrals for preventative services - none needed Immunizations addressed - for flu shot Smoking counseling  - none needed Evidence for depression or other mood disorder - chronic anxiety stable, no panic Most recent labs reviewed. I have personally reviewed and have noted: 1) the patient's medical and social history 2) The patient's current medications and supplements 3) The patient's height, weight, and BMI have been recorded in the chart

## 2021-12-13 NOTE — Assessment & Plan Note (Signed)
BP Readings from Last 3 Encounters:  12/10/21 (!) 144/86  11/21/21 130/80  11/10/21 130/80   Uncontrolled, states BP at home < 140/90 , pt to continue medical treatment altace 10 mg qd - declines other change for now

## 2021-12-13 NOTE — Assessment & Plan Note (Signed)
Lab Results  Component Value Date   HGBA1C 8.4 (A) 12/10/2021   Uncontrolled,, pt to continue current medical treatment metformin ER 500 mg - 4 qd, and add farxiga 5 mg qd

## 2021-12-13 NOTE — Assessment & Plan Note (Signed)
tx for recent UTI, asympt, exam benign, udip neg,  to f/u any worsening symptoms or concerns

## 2021-12-13 NOTE — Assessment & Plan Note (Signed)
Atypical, pt unable for ecg due to time contraint today has to leave, but for stress test as per pt request

## 2021-12-22 ENCOUNTER — Telehealth: Payer: Self-pay

## 2021-12-22 NOTE — Telephone Encounter (Signed)
Patient called in stating he received a collections bill for DOS 3/13 of $25. Advised patient that that was due to discussing vertigo and explained that insurance will bill you for discussing acute issues at the time of the visit. Jacob [name] was not happy with this but said he will call collections and pay it.   I notified Jacob [name] that there was another $25 copy for DOS 09/01/21, but from the looks of it he paid his copay at the time of service so advised I would reach out to the manager to dig into why there is an additional $25 that is being billed. Jacob [name] verbalized understanding and said he will look for a call back.

## 2021-12-25 ENCOUNTER — Telehealth (HOSPITAL_COMMUNITY): Payer: Self-pay | Admitting: Internal Medicine

## 2021-12-25 NOTE — Telephone Encounter (Signed)
I called to schedule Myoview per your order and patient does not wish to schedule at this time due to the medical expense. Order will be removed form the active [place]. If patient calls back to reschedule we will reinstate the order. Thank you

## 2022-01-05 ENCOUNTER — Encounter (HOSPITAL_COMMUNITY): Payer: Self-pay | Admitting: Internal Medicine

## 2022-01-06 ENCOUNTER — Ambulatory Visit (HOSPITAL_COMMUNITY)
Admission: RE | Admit: 2022-01-06 | Discharge: 2022-01-06 | Disposition: A | Discharge status: Home / Self Care | Payer: 59 | Source: Ambulatory Visit | Attending: Internal Medicine | Admitting: Internal Medicine

## 2022-01-06 DIAGNOSIS — R079 Chest pain, unspecified: Secondary | ICD-10-CM | POA: Diagnosis present

## 2022-01-06 LAB — MYOCARDIAL PERFUSION IMAGING
Angina Index: 0
Base ST Depression (mm): 0 mm
Duke Treadmill Score: 9
Estimated workload: 10.1
Exercise duration (min): 9 min
Exercise duration (sec): 0 s
LV dias vol: 104 mL (ref 62–150)
LV sys vol: 45 mL
MPHR: 166 {beats}/min
Nuc Stress EF: 57 %
Peak HR: 150 {beats}/min
Percent HR: 90 %
Rest HR: 57 {beats}/min
Rest Nuclear Isotope Dose: 10.5 mCi
SDS: 0
SRS: 1
SSS: 1
ST Depression (mm): 0 mm
Stress Nuclear Isotope Dose: 31.1 mCi
TID: 1.05

## 2022-01-06 MED ORDER — REGADENOSON 0.4 MG/5ML IV SOLN
0.4000 mg | Freq: Once | INTRAVENOUS | Status: AC
  Administered 2022-01-06: 0.4 mg via INTRAVENOUS

## 2022-01-06 MED ORDER — TECHNETIUM TC 99M TETROFOSMIN IV KIT
10.5000 | PACK | Freq: Once | INTRAVENOUS | Status: AC | PRN
  Administered 2022-01-06: 10.5 via INTRAVENOUS

## 2022-01-06 MED ORDER — TECHNETIUM TC 99M TETROFOSMIN IV KIT
31.1000 | PACK | Freq: Once | INTRAVENOUS | Status: AC | PRN
  Administered 2022-01-06: 31.1 via INTRAVENOUS

## 2022-02-24 ENCOUNTER — Ambulatory Visit: Payer: Self-pay

## 2022-02-24 ENCOUNTER — Ambulatory Visit: Payer: 59 | Admitting: Family Medicine

## 2022-02-24 VITALS — BP 150/86 | HR 68 | Ht 72.0 in | Wt 188.0 lb

## 2022-02-24 DIAGNOSIS — M25511 Pain in right shoulder: Secondary | ICD-10-CM | POA: Diagnosis not present

## 2022-02-24 DIAGNOSIS — S46011D Strain of muscle(s) and tendon(s) of the rotator cuff of right shoulder, subsequent encounter: Secondary | ICD-10-CM | POA: Diagnosis not present

## 2022-02-24 DIAGNOSIS — M7541 Impingement syndrome of right shoulder: Secondary | ICD-10-CM

## 2022-02-24 DIAGNOSIS — G8929 Other chronic pain: Secondary | ICD-10-CM | POA: Diagnosis not present

## 2022-02-24 MED ORDER — MELOXICAM 15 MG PO TABS: 15.0000 mg | ORAL_TABLET | Freq: Every day | ORAL | 0 refills | Status: DC

## 2022-02-24 NOTE — Patient Instructions (Addendum)
Thank you for coming in today.   You received an injection today. Seek immediate medical attention if the joint becomes red, extremely painful, or is oozing fluid.   Use meloxicam with tylenol as needed.   Tylenol arthritis is generally the safest medicine for you to take.   You can take it with melxociam as needed. Try to not take meloxicam or ibuprofen or aleve every day  Recheck in 3 months.

## 2022-02-24 NOTE — Progress Notes (Signed)
I, [name], LAT, ATC acting as a scribe for [name], MD.  Jacob [name] is a 56 y.o. male who presents to [place] at [facility] today for continued right shoulder pain.  Based on MRI findings, pt was referred to [facility] and had a consultation w/ Dr. Marlou Sa on 11/20/21, but the surgery scheduled for 11/24/21, was canceled. Patient was last seen by Dr. Georgina Snell on 11/21/2021 and was given a R subacromial steroid injection. Today, pt reports he is going to need to wait at least a year before he thinks he will be able to have shoulder surgery. Pt reports returning R shoulder pain over the past month.   He cannot have surgery in because his disability insurance will not cover the time off work for the surgery because his pre-existing condition for a year.  Dx imaging: 11/17/21 R shoulder MRI   11/10/18 R shoulder XR  Pertinent review of systems: No fevers or chills  Relevant historical information: Hypertension   Exam:  BP (!) 150/86   Pulse 68   Ht 6' (1.829 m)   Wt 188 lb (85.3 kg)   SpO2 98%   BMI 25.50 kg/m  General: Well Developed, well nourished, and in no acute distress.   MSK: Right shoulder normal appearing normal motion pain with abduction. Positive Hawkins and Neer's test.  Intact strength.   Lab and Radiology Results  Procedure: Real-time Ultrasound Guided Injection of right shoulder subacromial bursa Device: Philips Affiniti 50G Images permanently stored and available for review in PACS Ultrasound evaluation showed subacromial bursitis. Verbal informed consent obtained.  Discussed risks and benefits of procedure. Warned about infection, bleeding, hyperglycemia damage to structures among others. Patient expresses understanding and agreement Time-out conducted.   Noted no overlying erythema, induration, or other signs of local infection.   Skin prepped in a sterile fashion.   Local anesthesia: Topical Ethyl chloride.   With sterile  technique and under real time ultrasound guidance: 40 mg of Kenalog and 2 mL Marcaine injected into subacromial bursa. Fluid seen entering the bursa.   Completed without difficulty   Pain immediately resolved suggesting accurate placement of the medication.   Advised to call if fevers/chills, erythema, induration, drainage, or persistent bleeding.   Images permanently stored and available for review in the ultrasound unit.  Impression: Technically successful ultrasound guided injection.         Assessment and Plan: 56 y.o. male with right shoulder pain due to rotator cuff tendinopathy with partial tear and impingement.  Plan for repeat injection today.  Check back in about 3 months. Refilled meloxicam on a limited basis.  Recommend Tylenol on a daily basis for pain control and adding meloxicam as needed.  PDMP not reviewed this encounter. Orders Placed This Encounter  Procedures   [place] LIMITED JOINT SPACE STRUCTURES UP RIGHT(NO LINKED CHARGES)    Order Specific Question:   Reason for Exam (SYMPTOM  OR DIAGNOSIS REQUIRED)    Answer:   right shoulder pain    Order Specific Question:   Preferred imaging location?    Answer:   [place]   Meds ordered this encounter  Medications   meloxicam (MOBIC) 15 MG tablet    Sig: Take 1 tablet (15 mg total) by mouth daily.    Dispense:  30 tablet    Refill:  0     Discussed warning signs or symptoms. Please see discharge instructions. Patient expresses understanding.   The above documentation  has been reviewed and is accurate and complete [name], M.D.

## 2022-04-10 ENCOUNTER — Encounter: Payer: Self-pay | Admitting: Internal Medicine

## 2022-04-10 ENCOUNTER — Ambulatory Visit: Payer: 59 | Admitting: Internal Medicine

## 2022-04-10 VITALS — BP 138/86 | HR 77 | Temp 98.8°F | Ht 72.0 in | Wt 186.0 lb

## 2022-04-10 DIAGNOSIS — I10 Essential (primary) hypertension: Secondary | ICD-10-CM

## 2022-04-10 DIAGNOSIS — E559 Vitamin D deficiency, unspecified: Secondary | ICD-10-CM | POA: Diagnosis not present

## 2022-04-10 DIAGNOSIS — E0865 Diabetes mellitus due to underlying condition with hyperglycemia: Secondary | ICD-10-CM

## 2022-04-10 DIAGNOSIS — Z125 Encounter for screening for malignant neoplasm of prostate: Secondary | ICD-10-CM

## 2022-04-10 DIAGNOSIS — R252 Cramp and spasm: Secondary | ICD-10-CM

## 2022-04-10 DIAGNOSIS — E538 Deficiency of other specified B group vitamins: Secondary | ICD-10-CM

## 2022-04-10 LAB — POCT GLYCOSYLATED HEMOGLOBIN (HGB A1C): Hemoglobin A1C: 7.2 % — AB (ref 4.0–5.6)

## 2022-04-10 MED ORDER — CYCLOBENZAPRINE HCL 5 MG PO TABS: ORAL_TABLET | ORAL | 5 refills | Status: DC

## 2022-04-10 MED ORDER — EMPAGLIFLOZIN 25 MG PO TABS: 25.0000 mg | ORAL_TABLET | Freq: Every day | ORAL | 3 refills | Status: DC

## 2022-04-10 NOTE — Assessment & Plan Note (Signed)
Lab Results  Component Value Date   HGBA1C 7.2 (A) 04/10/2022   Uncontrolled mildly, farxiga no longer covered by insurance, now for change to jardiance 25 mg qd, f/u A1c at next visit; continue metformin ER 500 mg - 4 qd

## 2022-04-10 NOTE — Assessment & Plan Note (Signed)
With night time worsening for over 1 mo - for flexeril 5-10 mg qhs prn

## 2022-04-10 NOTE — Assessment & Plan Note (Signed)
BP Readings from Last 3 Encounters:  04/10/22 138/86  02/24/22 (!) 150/86  12/10/21 (!) 144/86   Stable, pt to continue medical treatment altace 10 mg qd

## 2022-04-10 NOTE — Patient Instructions (Signed)
Please take all new medication as prescribed - the flexeril muscle relaxer as needed  Please continue all other medications as before, except ok to change the Farxiga 10 mg to Jardiance 25 mg due to insurance  Please have the pharmacy call with any other refills you may need.  Please continue your efforts at being more active, low cholesterol diet, and weight control.  Please keep your appointments with your specialists as you may have planned  Please make an Appointment to return in 3 months, or sooner if needed, also with Labs done a few days early at the [facility]

## 2022-04-10 NOTE — Progress Notes (Signed)
Patient ID: Jacob [name], male   DOB: 1965/11/12, 57 y.o.   MRN: [id number]        Chief Complaint: follow up HTN, HLD and DM, night time leg cramps       HPI:  Jacob [name] is a 57 y.o. male here overall doing ok except for 1 mo nightly left and right leg cramping at night.  Has a physical job with lots of standing and movement every day.   Pt denies polydipsia, polyuria, or new focal neuro s/s.  Pt denies chest pain, increased sob or doe, wheezing, orthopnea, PND, increased LE swelling, palpitations, dizziness or syncope.   Pt denies fever, wt loss, night sweats, loss of appetite, or other constitutional symptoms  [place] no longer covered by insurance.         Wt Readings from Last 3 Encounters:  04/10/22 186 lb (84.4 kg)  02/24/22 188 lb (85.3 kg)  01/06/22 188 lb (85.3 kg)   BP Readings from Last 3 Encounters:  04/10/22 138/86  02/24/22 (!) 150/86  12/10/21 (!) 144/86         Past Medical History:  Diagnosis Date   Allergic rhinitis    Anxiety    Arthritis    Cholesteatoma of right ear    Diabetes mellitus ([place])    bordreline   Dysuria    Enlarged prostate    pt unaware   GERD (gastroesophageal reflux disease)    Headache    Hearing loss    Right ear   Hernia of abdominal wall    History of gross hematuria    History of head injury    History of kidney stones    History of palpitations    Hyperlipidemia 10/18/2014   Hypertension    Insomnia    Lack of concentration    Nasal fracture    Several   Otitis media 11/10/2017   Bilateral   Panic attacks    Syncope    Vertigo    Past Surgical History:  Procedure Laterality Date   CHOLECYSTECTOMY     CYSTOSCOPY N/A 05/10/2018   Procedure: CYSTOSCOPY WITH EXAM UNDER ANESTHESIA;  Surgeon: Ceasar Mons, MD;  Location: [facility];  Service: Urology;  Laterality: N/A;  ONLY NEEDS 30 MIN   HYPOSPADIAS CORRECTION N/A 1978   INNER EAR SURGERY     right   LUMBAR [place] SURGERY      MASTOIDECTOMY Right    NOSE SURGERY     Fracture   tubes in bil ears     UPPER GASTROINTESTINAL ENDOSCOPY     UPPER GI ENDOSCOPY  01/09/2012   Esophageal dilation    reports that he has never smoked. He has never used smokeless tobacco. He reports current alcohol use of about 5.0 standard drinks of alcohol per week. He reports that he does not use drugs. family history includes ADD / ADHD in his son; Anxiety disorder in his cousin, maternal aunt, maternal grandmother, maternal uncle, and mother; COPD in his mother; Cancer in his father; Diabetes in his maternal grandmother and paternal grandmother; Drug abuse in his son; Heart attack in his brother; Heart disease in his father; Hypertension in his mother; Lung cancer in his maternal uncle; OCD in his maternal aunt. No Known Allergies Current Outpatient Medications on File Prior to Visit  Medication Sig Dispense Refill   ALPRAZolam (XANAX) 0.5 MG tablet Take 1-2 tablets (0.5-1 mg total) by mouth at bedtime as needed. 60 tablet 5  Ascorbic Acid (VITAMIN C PO) Take 500 mg by mouth daily.     aspirin EC 81 MG tablet Take 1 tablet (81 mg total) by mouth daily. 90 tablet 11   busPIRone (BUSPAR) 10 MG tablet Take 1 tablet (10 mg total) by mouth 2 (two) times daily. 180 tablet 3   cyanocobalamin (VITAMIN B12) 1000 MCG tablet Take 1 tablet (1,000 mcg total) by mouth daily. 90 tablet 1   meclizine (ANTIVERT) 12.5 MG tablet Take 1 tablet (12.5 mg total) by mouth 3 (three) times daily as needed for dizziness. 60 tablet 1   meloxicam (MOBIC) 15 MG tablet Take 1 tablet (15 mg total) by mouth daily. 30 tablet 0   metFORMIN (GLUCOPHAGE-XR) 500 MG 24 hr tablet TAKE 4 TABLETS BY MOUTH ONCE DAILY WITH BREAKFAST 360 tablet 3   Multiple Vitamin (MULTIVITAMIN WITH MINERALS) TABS tablet Take 1 tablet by mouth daily.     Omega-3 Fatty Acids (FISH OIL PO) Take 1 capsule by mouth daily.     pantoprazole (PROTONIX) 40 MG tablet Take 1 tablet (40 mg total) by mouth 2  (two) times daily. 180 tablet 3   ramipril (ALTACE) 10 MG capsule Take 1 capsule (10 mg total) by mouth daily. 90 capsule 3   rosuvastatin (CRESTOR) 10 MG tablet Take 1 tablet (10 mg total) by mouth daily. 90 tablet 3   tadalafil (CIALIS) 20 MG tablet Take 1 tablet (20 mg total) by mouth daily as needed. 8 tablet 7   traMADol (ULTRAM) 50 MG tablet Take by mouth every 6 (six) hours as needed.     triamcinolone (NASACORT) 55 MCG/ACT AERO nasal inhaler Place 2 sprays into the nose daily. 3 Inhaler 3   No current facility-administered medications on file prior to visit.        ROS:  All others reviewed and negative.  Objective        PE:  BP 138/86 (BP Location: Right Arm, Patient Position: Standing, Cuff Size: Large)   Pulse 77   Temp 98.8 F (37.1 C) (Oral)   Ht 6' (1.829 m)   Wt 186 lb (84.4 kg)   SpO2 95%   BMI 25.23 kg/m                 Constitutional: Pt appears in NAD               HENT: Head: NCAT.                Right Ear: External ear normal.                 Left Ear: External ear normal.                Eyes: . Pupils are equal, round, and reactive to light. Conjunctivae and EOM are normal               Nose: without d/c or deformity               Neck: Neck supple. Gross normal ROM               Cardiovascular: Normal rate and regular rhythm.                 Pulmonary/Chest: Effort normal and breath sounds without rales or wheezing.                Abd:  Soft, NT, ND, + BS, no organomegaly  Neurological: Pt is alert. At baseline orientation, motor grossly intact               Skin: Skin is warm. No rashes, no other new lesions, LE edema - none               Psychiatric: Pt behavior is normal without agitation   Micro: none  Cardiac tracings I have personally interpreted today:  none  Pertinent Radiological findings (summarize): none   Lab Results  Component Value Date   WBC 5.8 12/10/2021   HGB 13.7 12/10/2021   HCT 40.4 12/10/2021   PLT 240.0  12/10/2021   GLUCOSE 172 (H) 12/10/2021   CHOL 140 12/10/2021   TRIG 109.0 12/10/2021   HDL 57.30 12/10/2021   LDLDIRECT 102.0 11/23/2016   LDLCALC 60 12/10/2021   ALT 44 12/10/2021   AST 28 12/10/2021   NA 137 12/10/2021   K 4.4 12/10/2021   CL 102 12/10/2021   CREATININE 1.02 12/10/2021   BUN 17 12/10/2021   CO2 27 12/10/2021   TSH 1.45 12/10/2021   PSA 7.27 (H) 12/10/2021   INR 0.96 12/11/2017   HGBA1C 7.2 (A) 04/10/2022   MICROALBUR <0.7 10/08/2017   Assessment/Plan:  ASWAD WANDREY [name] is a 57 y.o. White or Caucasian [1] male with  has a past medical history of Allergic rhinitis, Anxiety, Arthritis, Cholesteatoma of right ear, Diabetes mellitus ([place]), Dysuria, Enlarged prostate, GERD (gastroesophageal reflux disease), Headache, Hearing loss, Hernia of abdominal wall, History of gross hematuria, History of head injury, History of kidney stones, History of palpitations, Hyperlipidemia (10/18/2014), Hypertension, Insomnia, Lack of concentration, Nasal fracture, Otitis media (11/10/2017), Panic attacks, Syncope, and Vertigo.  Diabetes mellitus due to underlying condition with hyperglycemia, without long-term current use of insulin (HCC) Lab Results  Component Value Date   HGBA1C 7.2 (A) 04/10/2022   Uncontrolled mildly, farxiga no longer covered by insurance, now for change to jardiance 25 mg qd, f/u A1c at next visit; continue metformin ER 500 mg - 4 qd   Essential hypertension, benign BP Readings from Last 3 Encounters:  04/10/22 138/86  02/24/22 (!) 150/86  12/10/21 (!) 144/86   Stable, pt to continue medical treatment altace 10 mg qd   Leg cramping With night time worsening for over 1 mo - for flexeril 5-10 mg qhs prn  Followup: Return in about 3 months (around 07/10/2022).  Cathlean Cower, MD 04/10/2022 8:44 PM [facility]

## 2022-04-17 ENCOUNTER — Ambulatory Visit (INDEPENDENT_AMBULATORY_CARE_PROVIDER_SITE_OTHER): Payer: BC Managed Care – PPO | Admitting: Psychiatry

## 2022-04-30 ENCOUNTER — Ambulatory Visit: Payer: BC Managed Care – PPO | Admitting: Psychiatry

## 2022-05-21 ENCOUNTER — Other Ambulatory Visit: Payer: Self-pay | Admitting: Internal Medicine

## 2022-05-28 ENCOUNTER — Ambulatory Visit: Payer: BC Managed Care – PPO | Admitting: Psychiatry

## 2022-06-08 ENCOUNTER — Ambulatory Visit: Payer: 59 | Admitting: Family Medicine

## 2022-06-08 ENCOUNTER — Ambulatory Visit: Payer: Self-pay

## 2022-06-08 VITALS — BP 132/84 | HR 64 | Ht 72.0 in | Wt 184.0 lb

## 2022-06-08 DIAGNOSIS — M25561 Pain in right knee: Secondary | ICD-10-CM

## 2022-06-08 DIAGNOSIS — G8929 Other chronic pain: Secondary | ICD-10-CM | POA: Diagnosis not present

## 2022-06-08 DIAGNOSIS — M25511 Pain in right shoulder: Secondary | ICD-10-CM | POA: Diagnosis not present

## 2022-06-08 NOTE — Progress Notes (Unsigned)
   Shirlyn Goltz, PhD, LAT, ATC acting as a scribe for Jacob Leader, MD.  Jacob [name] is a 57 y.o. male who presents to [place] at [facility] today for cont'd R shoulder pain. Based on MRI findings, pt was referred to [facility] and had a consultation w/ Dr. Marlou Sa on 11/20/21, but the surgery scheduled for 11/24/21, was canceled. Patient was last seen by Dr. Georgina Snell 02/24/22 and was given a R subacromial steroid injection and was prescribed meloxicam and advised to use Tylenol. Today, pt reports R shoulder pain returned around the beginning of Feb. Pt suspect his shoulder pain being exacerbated by recently moving and doing a lot of lifting.  Pt would also like discuss his L shoulder, pain ongoing for about 1.5 months. Pt locates pain to the superior aspect of the L shoulder.  Aggravates: shoulder aBd  Dx imaging: 11/17/21 R shoulder MRI              11/10/18 R shoulder XR  Pertinent review of systems: ***  Relevant historical information: ***   Exam:  There were no vitals taken for this visit. General: Well Developed, well nourished, and in no acute distress.   MSK: ***    Lab and Radiology Results No results found for this or any previous visit (from the past 72 hour(s)). No results found.     Assessment and Plan: 57 y.o. male with ***   PDMP not reviewed this encounter. No orders of the defined types were placed in this encounter.  No orders of the defined types were placed in this encounter.    Discussed warning signs or symptoms. Please see discharge instructions. Patient expresses understanding.   ***

## 2022-06-08 NOTE — Patient Instructions (Addendum)
Thank you for coming in today.   You received an injection today. Seek immediate medical attention if the joint becomes red, extremely painful, or is oozing fluid.    I can evaluate and inject (likely) your knee anytime.   Let me know.   Recheck when you need.

## 2022-06-17 ENCOUNTER — Encounter: Payer: Self-pay | Admitting: Family Medicine

## 2022-06-17 ENCOUNTER — Ambulatory Visit: Payer: 59 | Admitting: Family Medicine

## 2022-06-17 VITALS — BP 132/88 | HR 88 | Temp 99.0°F | Ht 72.0 in | Wt 176.0 lb

## 2022-06-17 DIAGNOSIS — J302 Other seasonal allergic rhinitis: Secondary | ICD-10-CM | POA: Diagnosis not present

## 2022-06-17 DIAGNOSIS — H65191 Other acute nonsuppurative otitis media, right ear: Secondary | ICD-10-CM | POA: Diagnosis not present

## 2022-06-17 DIAGNOSIS — J069 Acute upper respiratory infection, unspecified: Secondary | ICD-10-CM

## 2022-06-17 DIAGNOSIS — H6501 Acute serous otitis media, right ear: Secondary | ICD-10-CM

## 2022-06-17 DIAGNOSIS — J029 Acute pharyngitis, unspecified: Secondary | ICD-10-CM

## 2022-06-17 LAB — POCT RAPID STREP A (OFFICE): Rapid Strep A Screen: NEGATIVE

## 2022-06-17 MED ORDER — CEPHALEXIN 500 MG PO CAPS: 500.0000 mg | ORAL_CAPSULE | Freq: Two times a day (BID) | ORAL | 0 refills | Status: AC

## 2022-06-17 MED ORDER — FLUTICASONE PROPIONATE 50 MCG/ACT NA SUSP: 1.0000 | Freq: Every day | NASAL | 0 refills | Status: DC

## 2022-06-17 MED ORDER — FLUTICASONE PROPIONATE 50 MCG/ACT NA SUSP: 2.0000 | Freq: Every day | NASAL | 6 refills | Status: DC

## 2022-06-17 NOTE — Patient Instructions (Signed)
Prescription for Keflex and antibiotic for your ear was sent to your pharmacy as well as Flonase nasal spray.

## 2022-06-17 NOTE — Progress Notes (Signed)
   Established Patient Office Visit   Subjective  Patient ID: Jacob [name], male    DOB: 1965/05/18  Age: 57 y.o. MRN: [id number]  No chief complaint on file.   Pt is a 57 yo male with pmh sig for DM II, anxiety, recurrent UTIs, h/o ear surgery who was seen for acute concern.  Pt started feeling bad 3 days ago.  Yesterday morning developed sore throat.  Also with nasal congestion, subjective fever, ear pain/pressure, and sensation of something "hung up in throat".  Denies cough.  Was around granddaughter who had a GI bug over the wknd.  Pt has a Urology appt today.  States has been on and off abx for the last few months.      ROS Negative unless stated above    Objective:     BP 132/88 (BP Location: Left Arm, Patient Position: Sitting, Cuff Size: Normal)   Pulse 88   Temp 99 F (37.2 C) (Oral)   Ht 6' (1.829 m)   Wt 176 lb (79.8 kg)   SpO2 97%   BMI 23.87 kg/m    Physical Exam Constitutional:      General: He is not in acute distress.    Appearance: Normal appearance.  HENT:     Head: Normocephalic and atraumatic.     Right Ear: A middle ear effusion is present. Tympanic membrane is erythematous and bulging.     Left Ear: Tympanic membrane normal.     Ears:     Comments: Right canal enlarged    Nose: Nose normal.     Mouth/Throat:     Mouth: Mucous membranes are moist.     Pharynx: Pharyngeal swelling and posterior oropharyngeal erythema present.     Tonsils: No tonsillar exudate.  Cardiovascular:     Rate and Rhythm: Normal rate and regular rhythm.     Heart sounds: Normal heart sounds. No murmur heard.    No gallop.  Pulmonary:     Effort: Pulmonary effort is normal. No respiratory distress.     Breath sounds: Normal breath sounds. No wheezing, rhonchi or rales.  Skin:    General: Skin is warm and dry.  Neurological:     Mental Status: He is alert and oriented to person, place, and time.      No results found for any visits on 06/17/22.     Assessment & Plan:  Viral URI  Sore throat  Acute effusion of right ear -     Cephalexin; Take 1 capsule (500 mg total) by mouth 2 (two) times daily for 7 days.  Dispense: 14 capsule; Refill: 0 -     Fluticasone Propionate; Place 1 spray into both nostrils daily.  Dispense: 16 g; Refill: 0  Seasonal allergies -     Fluticasone Propionate; Place 1 spray into both nostrils daily.  Dispense: 16 g; Refill: 0  Right acute serous otitis media, recurrence not specified -     Cephalexin; Take 1 capsule (500 mg total) by mouth 2 (two) times daily for 7 days.  Dispense: 14 capsule; Refill: 0  Rapid strep negative.  Symptoms likely 2/2 viral URI and seasonal allergies.  OTC antihistamines and other supportive care.  Start ABX and Flonase for effusion and AOM.  F/u with pcp.  Return if symptoms worsen or fail to improve.   Billie Ruddy, MD

## 2022-06-19 ENCOUNTER — Other Ambulatory Visit: Payer: Self-pay | Admitting: Urology

## 2022-07-01 NOTE — Progress Notes (Signed)
07/03/2022 Jacob [name] [id number] 1965-06-16  Referring provider: Corwin Levins, MD Primary GI doctor: Dr. Rhea Belton  ASSESSMENT AND PLAN:   Nausea without vomiting, AB discomfort, weight loss, GERD S/p cholecystectomy  EGD 12/2020 gastritis but negative biopsies Still on pantoprazole once daily Drinks 2 big gulp diet mountain dew a day, drinks 2-3 larges pours of red wine every night  Long discussion with the patient, he will stop wine, cut back on big gulp, follow up with PCP and we will get CT AB and pelvis with contrast since patient is having some pain, weight loss.  He does have cystoscopy scheduled with urology for hematuria as well, non smoker.  Check CBC, CMET, and lipase Follow up 2-3 months  Benign neoplasm of ascending colon Colonoscopy showed 3 polyps ascending colon 3 to 5 mm, 2 polyps transverse colon 3 to 4 mm, small internal hemorrhoids, 5 TA polyps, recall 3 years or 12/2023  Alcohol use -Alcohol Abstinence counseling discussed with patient as continued use is strongly associated with worsening liver disease progression - get on MVIT - resources given, discuss with PCP  B12 def Get on B12 sublingual  Patient Care Team: Corwin Levins, MD as PCP - General (Internal Medicine) Davina Poke (Optometry)  HISTORY OF PRESENT ILLNESS: 57 y.o. male with a past medical history of hypertension, GERD with dysphagia, type 2 diabetes, HOH, hyperlipidemia, chronic pain syndrome and others listed below presents for evaluation of nausea and vomiting.   01/20/2021 colonoscopy and endoscopy with Dr. Rhea Belton for screening purposes with family history and GERD/nausea Colonoscopy showed 3 polyps ascending colon 3 to 5 mm, 2 polyps transverse colon 3 to 4 mm, small internal hemorrhoids, 5 TA polyps, recall 3 years or 12/2023 Endoscopy showed normal esophagus, multiple gastric polyps benign, gastritis, normal duodenum, mild chronic gastritis negative H. pylori, benign  polyps.  09/11/2021 abdominal ultrasound showed increased echogenicity of the liver parenchyma and small renal cyst 12/10/2021 B12 deficiency 201  He states for 3-4 years he states if he gets a little hungry or randomly can have nausea but no vomiting. Every other day. Will wait it out, can last 1- 5 mins.  No associated symptoms with it, no sweating, no dizziness with episodes but has history of vertigo.  Never night time symptoms.  He is on protonix 40 mg once a day, no GERD.  States entire life has had issues with swallowing meat.  He has been having hematuria, getting cystoscopy.  He was started on jardiance, has had some weight loss and decreased episode.   He does not smoke, no drug use.  1-2 large pours of glasses of wine nightly for 2-3 years, occ 3-4 years.  Only drinks red wine because he thought it was heatlhy.   He  reports that he has never smoked. He has never used smokeless tobacco. He reports current alcohol use of about 5.0 standard drinks of alcohol per week. He reports that he does not use drugs.  RELEVANT LABS AND IMAGING: CBC    Component Value Date/Time   WBC 5.8 12/10/2021 1656   RBC 4.53 12/10/2021 1656   HGB 13.7 12/10/2021 1656   HCT 40.4 12/10/2021 1656   PLT 240.0 12/10/2021 1656   MCV 89.2 12/10/2021 1656   MCH 29.9 05/15/2021 1812   MCHC 34.0 12/10/2021 1656   RDW 13.6 12/10/2021 1656   LYMPHSABS 1.7 12/10/2021 1656   MONOABS 0.5 12/10/2021 1656   EOSABS 0.1 12/10/2021 1656  BASOSABS 0.0 12/10/2021 1656   Recent Labs    12/10/21 1656  HGB 13.7     CMP     Component Value Date/Time   NA 137 12/10/2021 1656   K 4.4 12/10/2021 1656   CL 102 12/10/2021 1656   CO2 27 12/10/2021 1656   GLUCOSE 172 (H) 12/10/2021 1656   BUN 17 12/10/2021 1656   CREATININE 1.02 12/10/2021 1656   CREATININE 1.37 (H) 10/07/2014 1202   CALCIUM 9.3 12/10/2021 1656   PROT 6.9 12/10/2021 1656   ALBUMIN 4.0 12/10/2021 1656   AST 28 12/10/2021 1656   ALT 44  12/10/2021 1656   ALKPHOS 68 12/10/2021 1656   BILITOT 0.4 12/10/2021 1656   GFRNONAA >60 05/15/2021 1812   GFRNONAA 61 10/07/2014 1202   GFRAA >60 03/09/2019 1335   GFRAA 70 10/07/2014 1202      Latest Ref Rng & Units 12/10/2021    4:56 PM 05/15/2021    6:12 PM 06/17/2020    3:07 PM  Hepatic Function  Total Protein 6.0 - 8.3 g/dL 6.9  6.9  7.2   Albumin 3.5 - 5.2 g/dL 4.0  4.3  4.6   AST 0 - 37 U/L 28  36  16   ALT 0 - 53 U/L 44  53  25   Alk Phosphatase 39 - 117 U/L 68  62  62   Total Bilirubin 0.2 - 1.2 mg/dL 0.4  0.5  0.5   Bilirubin, Direct 0.0 - 0.3 mg/dL 0.1   0.1       Current Medications:   Current Outpatient Medications (Endocrine & Metabolic):    empagliflozin (JARDIANCE) 25 MG TABS tablet, Take 1 tablet (25 mg total) by mouth daily before breakfast.   metFORMIN (GLUCOPHAGE-XR) 500 MG 24 hr tablet, TAKE 4 TABLETS BY MOUTH ONCE DAILY WITH BREAKFAST  Current Outpatient Medications (Cardiovascular):    ramipril (ALTACE) 10 MG capsule, Take 1 capsule (10 mg total) by mouth daily.   rosuvastatin (CRESTOR) 10 MG tablet, Take 1 tablet (10 mg total) by mouth daily.   Current Outpatient Medications (Analgesics):    aspirin EC 81 MG tablet, Take 1 tablet (81 mg total) by mouth daily.  Current Outpatient Medications (Hematological):    cyanocobalamin (VITAMIN B12) 1000 MCG tablet, Take 1 tablet (1,000 mcg total) by mouth daily.  Current Outpatient Medications (Other):    ALPRAZolam (XANAX) 0.5 MG tablet, TAKE 1 TO 2 TABLETS BY MOUTH AT BEDTIME AS NEEDED   Ascorbic Acid (VITAMIN C PO), Take 500 mg by mouth daily.   Multiple Vitamin (MULTIVITAMIN WITH MINERALS) TABS tablet, Take 1 tablet by mouth daily.   Omega-3 Fatty Acids (FISH OIL PO), Take 1 capsule by mouth daily.   pantoprazole (PROTONIX) 40 MG tablet, Take 1 tablet (40 mg total) by mouth 2 (two) times daily.  Medical History:  Past Medical History:  Diagnosis Date   Allergic rhinitis    Anxiety    Arthritis     Cholesteatoma of right ear    Diabetes mellitus    bordreline   Dysuria    Enlarged prostate    pt unaware   GERD (gastroesophageal reflux disease)    Headache    Hearing loss    Right ear   Hernia of abdominal wall    History of gross hematuria    History of head injury    History of kidney stones    History of palpitations    Hyperlipidemia 10/18/2014   Hypertension  Insomnia    Lack of concentration    Nasal fracture    Several   Otitis media 11/10/2017   Bilateral   Panic attacks    Syncope    Vertigo    Allergies: No Known Allergies   Surgical History:  He  has a past surgical history that includes Lumbar disc surgery; Inner ear surgery; Cholecystectomy; tubes in bil ears; Hypospadius correction (N/A, 1978); Mastoidectomy (Right); Nose surgery; Upper gi endoscopy (01/09/2012); Cystoscopy (N/A, 05/10/2018); and Upper gastrointestinal endoscopy. Family History:  His family history includes ADD / ADHD in his son; Anxiety disorder in his cousin, maternal aunt, maternal grandmother, maternal uncle, and mother; COPD in his mother; Cancer in his father; Diabetes in his maternal grandmother and paternal grandmother; Drug abuse in his son; Heart attack in his brother; Heart disease in his father; Hypertension in his mother; Lung cancer in his maternal uncle; OCD in his maternal aunt.  REVIEW OF SYSTEMS  : All other systems reviewed and negative except where noted in the History of Present Illness.  PHYSICAL EXAM: BP (!) 140/78   Pulse 84   Ht 5\' 11"  (1.803 m)   Wt 179 lb (81.2 kg)   SpO2 98%   BMI 24.97 kg/m  General Appearance: Well nourished, in no apparent distress. Head:   Normocephalic and atraumatic. Eyes:  sclerae anicteric,conjunctive pink  Respiratory: Respiratory effort normal, BS equal bilaterally without rales, rhonchi, wheezing. Cardio: RRR with no MRGs. Peripheral pulses intact.  Abdomen: Soft,  Obese ,active bowel sounds. mild tenderness in the  epigastrium. Without guarding and Without rebound. No masses. Rectal: Not evaluated Musculoskeletal: Full ROM, Normal gait. Without edema. Skin:  Dry and intact without significant lesions or rashes Neuro: Alert and  oriented x4;  No focal deficits. Psych:  Cooperative. Normal mood and affect.    Doree Albee, PA-C 3:26 PM

## 2022-07-03 ENCOUNTER — Other Ambulatory Visit (INDEPENDENT_AMBULATORY_CARE_PROVIDER_SITE_OTHER): Payer: 59

## 2022-07-03 ENCOUNTER — Ambulatory Visit: Payer: 59 | Admitting: Physician Assistant

## 2022-07-03 ENCOUNTER — Encounter: Payer: Self-pay | Admitting: Physician Assistant

## 2022-07-03 VITALS — BP 140/78 | HR 84 | Ht 71.0 in | Wt 179.0 lb

## 2022-07-03 DIAGNOSIS — D122 Benign neoplasm of ascending colon: Secondary | ICD-10-CM | POA: Diagnosis not present

## 2022-07-03 DIAGNOSIS — K219 Gastro-esophageal reflux disease without esophagitis: Secondary | ICD-10-CM

## 2022-07-03 DIAGNOSIS — R110 Nausea: Secondary | ICD-10-CM

## 2022-07-03 DIAGNOSIS — R1084 Generalized abdominal pain: Secondary | ICD-10-CM | POA: Diagnosis not present

## 2022-07-03 DIAGNOSIS — Z789 Other specified health status: Secondary | ICD-10-CM

## 2022-07-03 LAB — CBC WITH DIFFERENTIAL/PLATELET
Basophils Absolute: 0 10*3/uL (ref 0.0–0.1)
Basophils Relative: 0.5 % (ref 0.0–3.0)
Eosinophils Absolute: 0.1 10*3/uL (ref 0.0–0.7)
Eosinophils Relative: 1 % (ref 0.0–5.0)
HCT: 45.7 % (ref 39.0–52.0)
Hemoglobin: 15.7 g/dL (ref 13.0–17.0)
Lymphocytes Relative: 27.9 % (ref 12.0–46.0)
Lymphs Abs: 1.5 10*3/uL (ref 0.7–4.0)
MCHC: 34.3 g/dL (ref 30.0–36.0)
MCV: 91.7 fl (ref 78.0–100.0)
Monocytes Absolute: 0.5 10*3/uL (ref 0.1–1.0)
Monocytes Relative: 8.8 % (ref 3.0–12.0)
Neutro Abs: 3.4 10*3/uL (ref 1.4–7.7)
Neutrophils Relative %: 61.8 % (ref 43.0–77.0)
Platelets: 259 10*3/uL (ref 150.0–400.0)
RBC: 4.99 Mil/uL (ref 4.22–5.81)
RDW: 13.4 % (ref 11.5–15.5)
WBC: 5.5 10*3/uL (ref 4.0–10.5)

## 2022-07-03 LAB — COMPREHENSIVE METABOLIC PANEL
ALT: 41 U/L (ref 0–53)
AST: 22 U/L (ref 0–37)
Albumin: 4.5 g/dL (ref 3.5–5.2)
Alkaline Phosphatase: 87 U/L (ref 39–117)
BUN: 14 mg/dL (ref 6–23)
CO2: 26 mEq/L (ref 19–32)
Calcium: 9.6 mg/dL (ref 8.4–10.5)
Chloride: 100 mEq/L (ref 96–112)
Creatinine, Ser: 1 mg/dL (ref 0.40–1.50)
GFR: 84.87 mL/min (ref >60.00)
Glucose, Bld: 186 mg/dL — ABNORMAL HIGH (ref 70–99)
Potassium: 4.1 mEq/L (ref 3.5–5.1)
Sodium: 137 mEq/L (ref 135–145)
Total Bilirubin: 0.4 mg/dL (ref 0.2–1.2)
Total Protein: 7.7 g/dL (ref 6.0–8.3)

## 2022-07-03 LAB — TSH: TSH: 1.69 u[IU]/mL (ref 0.35–5.50)

## 2022-07-03 LAB — LIPASE: Lipase: 31 U/L (ref 11.0–59.0)

## 2022-07-03 LAB — CORTISOL: Cortisol, Plasma: 4.5 ug/dL

## 2022-07-03 NOTE — Patient Instructions (Addendum)
Your provider has requested that you go to the basement level for lab work before leaving today. Press "B" on the elevator. The lab is located at the first door on the left as you exit the elevator.   B12 is mainly in meat, so increase meat can help this but often people have a deficiency that they are just not absorbing it well with meat or pills, so get the sublingual/melt in your mouth one.   If the sublingual one dose not increase your level, we will discuss shots.   You have been scheduled for a CT scan of the abdomen and pelvis at [facility]. You are scheduled on Friday 07/10/22 at 4 pm. You should arrive at 2 pm for registration and to drink oral contrast.   Please follow the written instructions below on the day of your exam:   1) Do not eat anything after 12 pm (4 hours prior to your test)   You may take any medications as prescribed with a small amount of water, if necessary. If you take any of the following medications: METFORMIN, GLUCOPHAGE, GLUCOVANCE, AVANDAMET, RIOMET, FORTAMET, ACTOPLUS MET, JANUMET, GLUMETZA or METAGLIP, you MAY be asked to HOLD this medication 48 hours AFTER the exam.   The purpose of you drinking the oral contrast is to aid in the visualization of your intestinal tract. The contrast solution may cause some diarrhea. Depending on your individual set of symptoms, you may also receive an intravenous injection of x-ray contrast/dye. Plan on being at [facility] for 45 minutes or longer, depending on the type of exam you are having performed.   If you have any questions regarding your exam or if you need to reschedule, you may call [facility] at [phone] between the hours of 8:00 am and 5:00 pm, Monday-Friday.      Vitamin B12 Deficiency Vitamin B12 deficiency occurs when the body does not have enough vitamin B12, which is an important vitamin. The body needs this vitamin: To make red blood cells. To make DNA. This is the genetic  material inside cells. To help the nerves work properly so they can carry messages from the brain to the body. Vitamin B12 deficiency can cause various health problems, such as a low red blood cell count (anemia) or nerve damage. What are the causes? This condition may be caused by: Not eating enough foods that contain vitamin B12. Not having enough stomach acid and digestive fluids to properly absorb vitamin B12 from the food that you eat. Certain digestive system diseases that make it hard to absorb vitamin B12. These diseases include Crohn's disease, chronic pancreatitis, and cystic fibrosis. A condition in which the body does not make enough of a protein (intrinsic factor), resulting in too few red blood cells (pernicious anemia). Having a surgery in which part of the stomach or small intestine is removed. Taking certain medicines that make it hard for the body to absorb vitamin B12. These medicines include: Heartburn medicines (antacids and proton pump inhibitors). Certain antibiotic medicines. Some medicines that are used to treat diabetes, tuberculosis, gout, or high cholesterol. What increases the risk? The following factors may make you more likely to develop a B12 deficiency: Being older than age 56. Eating a vegetarian or vegan diet, especially while you are pregnant. Eating a poor diet while you are pregnant. Taking certain medicines. Having alcoholism. What are the signs or symptoms? In some cases, there are no symptoms of this condition. If the condition leads to anemia  or nerve damage, various symptoms can occur, such as: Weakness. Fatigue. Loss of appetite. Weight loss. Numbness or tingling in your hands and feet. Redness and burning of the tongue. Confusion or memory problems. Depression. Sensory problems, such as color blindness, ringing in the ears, or loss of taste. Diarrhea or constipation. Trouble walking. If anemia is severe, symptoms can include: Shortness  of breath. Dizziness. Rapid heart rate (tachycardia). How is this diagnosed? This condition may be diagnosed with a blood test to measure the level of vitamin B12 in your blood. You may also have other tests, including: A group of tests that measure certain characteristics of blood cells (complete blood count, CBC). A blood test to measure intrinsic factor. A procedure where a thin tube with a camera on the end is used to look into your stomach or intestines (endoscopy). Other tests may be needed to discover the cause of B12 deficiency. How is this treated? Treatment for this condition depends on the cause. This condition may be treated by: Changing your eating and drinking habits, such as: Eating more foods that contain vitamin B12. Drinking less alcohol or no alcohol. Getting vitamin B12 injections. Taking vitamin B12 supplements. Your health care provider will tell you which dosage is best for you. Follow these instructions at home: Eating and drinking  Eat lots of healthy foods that contain vitamin B12, including: Meats and poultry. This includes beef, pork, chicken, [place], and organ meats, such as liver. Seafood. This includes clams, rainbow trout, salmon, tuna, and haddock. Eggs. Cereal and dairy products that are fortified. This means that vitamin B12 has been added to the food. Check the label on the package to see if the food is fortified. The items listed above may not be a complete list of recommended foods and beverages. Contact a dietitian for more information. General instructions Get any injections that are prescribed by your health care provider. Take supplements only as told by your health care provider. Follow the directions carefully. Do not drink alcohol if your health care provider tells you not to. In some cases, you may only be asked to limit alcohol use. Keep all follow-up visits as told by your health care provider. This is important. Contact a health care  provider if: Your symptoms come back. Get help right away if you: Develop shortness of breath. Have a rapid heart rate. Have chest pain. Become dizzy or lose consciousness. Summary Vitamin B12 deficiency occurs when the body does not have enough vitamin B12. The main causes of vitamin B12 deficiency include dietary deficiency, digestive diseases, pernicious anemia, and having a surgery in which part of the stomach or small intestine is removed. In some cases, there are no symptoms of this condition. If the condition leads to anemia or nerve damage, various symptoms can occur, such as weakness, shortness of breath, and numbness. Treatment may include getting vitamin B12 injections or taking vitamin B12 supplements. Eat lots of healthy foods that contain vitamin B12. This information is not intended to replace advice given to you by your health care provider. Make sure you discuss any questions you have with your health care provider. Document Revised: 09/02/2018 Document Reviewed: 11/23/2017 Elsevier Patient Education  2020 [employer].

## 2022-07-03 NOTE — Progress Notes (Signed)
Addendum: Reviewed and agree with assessment and management plan. Altonio Schwertner M, MD  

## 2022-07-07 ENCOUNTER — Telehealth: Payer: Self-pay

## 2022-07-07 NOTE — Transitions of Care (Post Inpatient/ED Visit) (Signed)
   07/07/2022  Name: Jacob [name] MRN: [id number] DOB: 1965/07/23  Today's TOC FU Call Status: Today's TOC FU Call Status:: Successful TOC FU Call Competed TOC FU Call Complete Date: 07/07/22  Transition Care Management Follow-up Telephone Call Date of Discharge: 07/06/22 Discharge Facility: Other ([facility]) Name of Other ([facility]) [facility]charge Facility: [facility] Type of Discharge: Emergency Department Reason for ED Visit: Other: (anxiety) How have you been since you were released from the hospital?: Better Any questions or concerns?: No  Items Reviewed: Did you receive and understand the discharge instructions provided?: Yes Medications obtained and verified?: Yes (Medications Reviewed) Any new allergies since your discharge?: No Dietary orders reviewed?: Yes Do you have support at home?: No  Home Care and Equipment/Supplies: Were Home Health Services Ordered?: NA Any new equipment or medical supplies ordered?: NA  Functional Questionnaire: Do you need assistance with bathing/showering or dressing?: No Do you need assistance with meal preparation?: No Do you need assistance with eating?: No Do you have difficulty maintaining continence: No Do you have difficulty managing or taking your medications?: No  Follow up appointments reviewed: PCP Follow-up appointment confirmed?: No (declined) MD Provider Line [phone] Given: No Specialist Hospital Follow-up appointment confirmed?: NA Do you need transportation to your follow-up appointment?: No Do you understand care options if your condition(s) worsen?: Yes-patient verbalized understanding    SIGNATURE Karena Addison, LPN [facility] Nurse Health Advisor Direct Dial [phone]

## 2022-07-08 ENCOUNTER — Encounter (HOSPITAL_BASED_OUTPATIENT_CLINIC_OR_DEPARTMENT_OTHER): Payer: Self-pay | Admitting: Urology

## 2022-07-08 NOTE — Progress Notes (Signed)
Spoke w/ via phone for pre-op interview---Jacob [name] needs dos---- CBG              [name] results------Current EKG in Epic dated 08/05/27. COVID test -----patient states asymptomatic no test needed Arrive at ------- NPO after MN NO Solid Food.  Clear liquids from MN until---0915 Med rec completed Medications to take morning of surgery -----Xanax PRN Diabetic medication -----NONE AM of surgery. Patient instructed no nail polish to be worn day of surgery Patient instructed to bring photo id and insurance card day of surgery Patient aware to have Driver (ride ) / caregiver [name]   for 24 hours after surgery  Patient Special Instructions ----- Pre-Op special Istructions ----- Patient verbalized understanding of instructions that were given at this phone interview. Patient denies shortness of breath, chest pain, fever, cough at this phone interview.

## 2022-07-10 ENCOUNTER — Ambulatory Visit (INDEPENDENT_AMBULATORY_CARE_PROVIDER_SITE_OTHER): Payer: 59

## 2022-07-10 DIAGNOSIS — R1084 Generalized abdominal pain: Secondary | ICD-10-CM | POA: Diagnosis not present

## 2022-07-10 DIAGNOSIS — R634 Abnormal weight loss: Secondary | ICD-10-CM

## 2022-07-10 DIAGNOSIS — R110 Nausea: Secondary | ICD-10-CM

## 2022-07-10 MED ORDER — IOHEXOL 300 MG/ML  SOLN
100.0000 mL | Freq: Once | INTRAMUSCULAR | Status: AC | PRN
  Administered 2022-07-10: 100 mL via INTRAVENOUS

## 2022-07-16 NOTE — Anesthesia Preprocedure Evaluation (Addendum)
Anesthesia Evaluation  Patient identified by MRN, date of birth, ID band Patient awake    Reviewed: Allergy & Precautions, H&P , NPO status , Patient's Chart, lab work & pertinent test results  Airway Mallampati: II  TM Distance: >3 FB Neck ROM: Full    Dental no notable dental hx. (+) Missing, Dental Advisory Given,    Pulmonary neg pulmonary ROS   Pulmonary exam normal breath sounds clear to auscultation       Cardiovascular hypertension, Normal cardiovascular exam Rhythm:Regular Rate:Normal     Neuro/Psych  Headaches  Neuromuscular disease negative neurological ROS  negative psych ROS   GI/Hepatic negative GI ROS, Neg liver ROS,GERD  Medicated and Controlled,,  Endo/Other  diabetes, Type 2, Oral Hypoglycemic Agents    Renal/GU Renal diseasenegative Renal ROSLab Results      Component                Value               Date                      CREATININE               1.00                07/03/2022                BUN                      14                  07/03/2022                NA                       137                 07/03/2022                K                        4.1                 07/03/2022                CL                       100                 07/03/2022                CO2                      26                  07/03/2022             negative genitourinary   Musculoskeletal negative musculoskeletal ROS (+) Arthritis ,    Abdominal   Peds negative pediatric ROS (+)  Hematology negative hematology ROS (+) Lab Results      Component                Value               Date  WBC                      5.5                 07/03/2022                HGB                      15.7                07/03/2022                HCT                      45.7                07/03/2022                MCV                      91.7                07/03/2022                PLT                       259.0               07/03/2022              Anesthesia Other Findings   Reproductive/Obstetrics negative OB ROS                              Anesthesia Physical Anesthesia Plan  ASA: 3  Anesthesia Plan: General   Post-op Pain Management: Ofirmev IV (intra-op)* and Precedex   Induction: Intravenous  PONV Risk Score and Plan: 3 and Treatment may vary due to age or medical condition, Ondansetron, Midazolam and Dexamethasone  Airway Management Planned: LMA  Additional Equipment: None  Intra-op Plan:   Post-operative Plan:   Informed Consent: I have reviewed the patients History and Physical, chart, labs and discussed the procedure including the risks, benefits and alternatives for the proposed anesthesia with the patient or authorized representative who has indicated his/her understanding and acceptance.     Dental advisory given  Plan Discussed with: CRNA, Anesthesiologist and Surgeon  Anesthesia Plan Comments:         Anesthesia Quick Evaluation

## 2022-07-17 ENCOUNTER — Encounter (HOSPITAL_BASED_OUTPATIENT_CLINIC_OR_DEPARTMENT_OTHER)
Admission: RE | Disposition: A | Discharge status: Home / Self Care | Payer: Self-pay | Source: Home / Self Care | Attending: Urology

## 2022-07-17 ENCOUNTER — Ambulatory Visit (HOSPITAL_BASED_OUTPATIENT_CLINIC_OR_DEPARTMENT_OTHER): Payer: 59 | Admitting: Anesthesiology

## 2022-07-17 ENCOUNTER — Other Ambulatory Visit: Payer: Self-pay

## 2022-07-17 ENCOUNTER — Ambulatory Visit (HOSPITAL_BASED_OUTPATIENT_CLINIC_OR_DEPARTMENT_OTHER)
Admission: RE | Admit: 2022-07-17 | Discharge: 2022-07-17 | Disposition: A | Discharge status: Home / Self Care | Payer: 59 | Attending: Urology | Admitting: Urology

## 2022-07-17 ENCOUNTER — Encounter (HOSPITAL_BASED_OUTPATIENT_CLINIC_OR_DEPARTMENT_OTHER): Payer: Self-pay | Admitting: Urology

## 2022-07-17 DIAGNOSIS — Z7984 Long term (current) use of oral hypoglycemic drugs: Secondary | ICD-10-CM

## 2022-07-17 DIAGNOSIS — Q540 Hypospadias, balanic: Secondary | ICD-10-CM | POA: Insufficient documentation

## 2022-07-17 DIAGNOSIS — K219 Gastro-esophageal reflux disease without esophagitis: Secondary | ICD-10-CM | POA: Diagnosis not present

## 2022-07-17 DIAGNOSIS — R310 Gross hematuria: Secondary | ICD-10-CM | POA: Diagnosis present

## 2022-07-17 DIAGNOSIS — N35911 Unspecified urethral stricture, male, meatal: Secondary | ICD-10-CM

## 2022-07-17 DIAGNOSIS — R300 Dysuria: Secondary | ICD-10-CM | POA: Insufficient documentation

## 2022-07-17 DIAGNOSIS — I10 Essential (primary) hypertension: Secondary | ICD-10-CM

## 2022-07-17 DIAGNOSIS — R350 Frequency of micturition: Secondary | ICD-10-CM | POA: Diagnosis not present

## 2022-07-17 DIAGNOSIS — E119 Type 2 diabetes mellitus without complications: Secondary | ICD-10-CM | POA: Insufficient documentation

## 2022-07-17 DIAGNOSIS — E1149 Type 2 diabetes mellitus with other diabetic neurological complication: Secondary | ICD-10-CM

## 2022-07-17 DIAGNOSIS — Z01818 Encounter for other preprocedural examination: Secondary | ICD-10-CM

## 2022-07-17 DIAGNOSIS — N401 Enlarged prostate with lower urinary tract symptoms: Secondary | ICD-10-CM | POA: Insufficient documentation

## 2022-07-17 DIAGNOSIS — E0865 Diabetes mellitus due to underlying condition with hyperglycemia: Secondary | ICD-10-CM

## 2022-07-17 DIAGNOSIS — R351 Nocturia: Secondary | ICD-10-CM | POA: Diagnosis not present

## 2022-07-17 HISTORY — PX: CYSTOSCOPY: SHX5120

## 2022-07-17 LAB — POCT I-STAT, CHEM 8
BUN: 12 mg/dL (ref 6–20)
Calcium, Ion: 1.24 mmol/L (ref 1.15–1.40)
Chloride: 101 mmol/L (ref 98–111)
Creatinine, Ser: 1 mg/dL (ref 0.61–1.24)
Glucose, Bld: 155 mg/dL — ABNORMAL HIGH (ref 70–99)
HCT: 49 % (ref 39.0–52.0)
Hemoglobin: 16.7 g/dL (ref 13.0–17.0)
Potassium: 4.5 mmol/L (ref 3.5–5.1)
Sodium: 140 mmol/L (ref 135–145)
TCO2: 26 mmol/L (ref 22–32)

## 2022-07-17 LAB — GLUCOSE, CAPILLARY: Glucose-Capillary: 163 mg/dL — ABNORMAL HIGH (ref 70–99)

## 2022-07-17 SURGERY — CYSTOSCOPY
Anesthesia: General | Site: Ureter

## 2022-07-17 MED ORDER — ONDANSETRON HCL 4 MG/2ML IJ SOLN
INTRAMUSCULAR | Status: AC
  Filled 2022-07-17: qty 2

## 2022-07-17 MED ORDER — ONDANSETRON HCL 4 MG/2ML IJ SOLN
INTRAMUSCULAR | Status: DC | PRN
  Administered 2022-07-17: 4 mg via INTRAVENOUS

## 2022-07-17 MED ORDER — CEFAZOLIN SODIUM-DEXTROSE 2-4 GM/100ML-% IV SOLN
2.0000 g | INTRAVENOUS | Status: AC
  Administered 2022-07-17: 2 g via INTRAVENOUS

## 2022-07-17 MED ORDER — DEXAMETHASONE SODIUM PHOSPHATE 10 MG/ML IJ SOLN
INTRAMUSCULAR | Status: AC
  Filled 2022-07-17: qty 1

## 2022-07-17 MED ORDER — FENTANYL CITRATE (PF) 100 MCG/2ML IJ SOLN
INTRAMUSCULAR | Status: DC | PRN
  Administered 2022-07-17: 50 ug via INTRAVENOUS

## 2022-07-17 MED ORDER — PROPOFOL 10 MG/ML IV BOLUS
INTRAVENOUS | Status: AC
  Filled 2022-07-17: qty 20

## 2022-07-17 MED ORDER — DEXMEDETOMIDINE HCL IN NACL 200 MCG/50ML IV SOLN
INTRAVENOUS | Status: DC | PRN
  Administered 2022-07-17: 8 ug via INTRAVENOUS

## 2022-07-17 MED ORDER — LIDOCAINE 2% (20 MG/ML) 5 ML SYRINGE
INTRAMUSCULAR | Status: DC | PRN
  Administered 2022-07-17: 100 mg via INTRAVENOUS

## 2022-07-17 MED ORDER — LACTATED RINGERS IV SOLN: INTRAVENOUS | Status: DC

## 2022-07-17 MED ORDER — FENTANYL CITRATE (PF) 100 MCG/2ML IJ SOLN
INTRAMUSCULAR | Status: AC
  Filled 2022-07-17: qty 2

## 2022-07-17 MED ORDER — PROPOFOL 10 MG/ML IV BOLUS
INTRAVENOUS | Status: DC | PRN
  Administered 2022-07-17: 160 mg via INTRAVENOUS

## 2022-07-17 MED ORDER — MIDAZOLAM HCL 5 MG/5ML IJ SOLN
INTRAMUSCULAR | Status: DC | PRN
  Administered 2022-07-17: 2 mg via INTRAVENOUS

## 2022-07-17 MED ORDER — IOHEXOL 300 MG/ML  SOLN
INTRAMUSCULAR | Status: DC | PRN
  Administered 2022-07-17: 14 mL via URETHRAL

## 2022-07-17 MED ORDER — DEXAMETHASONE SODIUM PHOSPHATE 10 MG/ML IJ SOLN
INTRAMUSCULAR | Status: DC | PRN
  Administered 2022-07-17: 10 mg via INTRAVENOUS

## 2022-07-17 MED ORDER — LIDOCAINE HCL (PF) 2 % IJ SOLN
INTRAMUSCULAR | Status: AC
  Filled 2022-07-17: qty 15

## 2022-07-17 MED ORDER — CEFAZOLIN SODIUM-DEXTROSE 2-4 GM/100ML-% IV SOLN
INTRAVENOUS | Status: AC
  Filled 2022-07-17: qty 100

## 2022-07-17 MED ORDER — SODIUM CHLORIDE 0.9 % IR SOLN
Status: DC | PRN
  Administered 2022-07-17: 3000 mL

## 2022-07-17 MED ORDER — MIDAZOLAM HCL 2 MG/2ML IJ SOLN
INTRAMUSCULAR | Status: AC
  Filled 2022-07-17: qty 2

## 2022-07-17 MED ORDER — HYDROCODONE-ACETAMINOPHEN 5-325 MG PO TABS: 1.0000 | ORAL_TABLET | ORAL | 0 refills | Status: DC | PRN

## 2022-07-17 SURGICAL SUPPLY — 16 items
BAG DRAIN URO-CYSTO SKYTR STRL (DRAIN) ×1 IMPLANT
BAG DRN UROCATH (DRAIN) ×1
CLOTH BEACON ORANGE TIMEOUT ST (SAFETY) ×1 IMPLANT
GLOVE BIO SURGEON STRL SZ 6.5 (GLOVE) IMPLANT
GLOVE BIO SURGEON STRL SZ7 (GLOVE) IMPLANT
GLOVE BIO SURGEON STRL SZ7.5 (GLOVE) ×1 IMPLANT
GLOVE BIOGEL PI IND STRL 7.0 (GLOVE) IMPLANT
GOWN STRL REUS W/ TWL LRG LVL3 (GOWN DISPOSABLE) IMPLANT
GOWN STRL REUS W/TWL LRG LVL3 (GOWN DISPOSABLE) ×3 IMPLANT
IV NS IRRIG 3000ML ARTHROMATIC (IV SOLUTION) IMPLANT
KIT TURNOVER CYSTO (KITS) ×1 IMPLANT
MANIFOLD NEPTUNE II (INSTRUMENTS) ×1 IMPLANT
NS IRRIG 500ML POUR BTL (IV SOLUTION) IMPLANT
PACK CYSTO (CUSTOM PROCEDURE TRAY) ×1 IMPLANT
SLEEVE SCD COMPRESS KNEE MED (STOCKING) ×1 IMPLANT
TUBE CONNECTING 12X1/4 (SUCTIONS) IMPLANT

## 2022-07-17 NOTE — Op Note (Signed)
Operative Note  Preoperative diagnosis:  1.  Gross hematuria, dysuria  Postoperative diagnosis: 1.  Gross hematuria, dysuria 2.  Meatal stenosis with coronal hypospadias  Procedure(s): 1.  Cystoscopy with urethral dilation 2.  Bilateral retrograde pyelogram  Surgeon: Modena Slater, MD  Assistants: None  Anesthesia: General  Complications: None immediate  EBL: Minimal  Specimens: 1.  None  Drains/Catheters: 1.  None  Intraoperative findings: 1.  Patient had a coronal hypospadias.  He had significant meatal stenosis.  Dilated to 30 [place].  Remainder of the urethra without any strictures.  Patient's prostate was short and without any significant obstruction.  Bladder neck was open.  Cystoscopy revealed no bladder tumors or stones.  No trabeculation.  No diverticuli.  Bilateral retrograde pyelogram without any filling defect or hydronephrosis.  Indication: 56 year old male with chronic dysuria.  Unable to tolerate cystoscopy in the clinic and presents for the previously mentioned operation.  Also endorses gross hematuria.  Description of procedure:  The patient was identified and consent was obtained.  The patient was taken to the operating room and placed in the supine position.  The patient was placed under general anesthesia.  Perioperative antibiotics were administered.  The patient was placed in dorsal lithotomy.  Patient was prepped and draped in a standard sterile fashion and a timeout was performed.  A 21 French rigid cystoscope was too large for the urethral meatus.  Upon further inspection, he had a very tight urethral meatus and a coronal hypospadias.  I dilated with sounds sequentially from 14 [place] up to 30 [place].  I was then able to easily pass the cystoscope into the urethra and into the bladder.  Complete cystoscopy was performed with the findings noted above.  The left ureter was cannulated with an open-ended ureteral catheter and a retrograde pyelogram was  performed with no abnormal findings.  Same performed on the right with no abnormal findings.  I drained the bladder and withdrew the scope.  Patient tolerated the procedure well was stable postoperatively.  Plan: Follow-up in 4 to 6 weeks for reassessment.  No evidence of any abnormality other than the meatal stenosis.

## 2022-07-17 NOTE — Discharge Instructions (Addendum)
May notice some blood in the urine.  This is normal as long as you can urinate. Post Anesthesia Home Care Instructions  Activity: Get plenty of rest for the remainder of the day. A responsible adult should stay with you for 24 hours following the procedure.  For the next 24 hours, DO NOT: -Drive a car -Advertising copywriter -Drink alcoholic beverages -Take any medication unless instructed by your physician -Make any legal decisions or sign important papers.  Meals: Start with liquid foods such as gelatin or soup. Progress to regular foods as tolerated. Avoid greasy, spicy, heavy foods. If nausea and/or vomiting occur, drink only clear liquids until the nausea and/or vomiting subsides. Call your physician if vomiting continues.  Special Instructions/Symptoms: Your throat may feel dry or sore from the anesthesia or the breathing tube placed in your throat during surgery. If this causes discomfort, gargle with warm salt water. The discomfort should disappear within 24 hours.

## 2022-07-17 NOTE — H&P (Signed)
CC/HPI: 02/10/2021: I saw him last month where he underwent successful ICI teaching utilizing Trimix 12/28/28. Now back today for follow-up exam. A few weeks ago when attempting the injection he notes his response was less than optimal, not satisfactory for penetrating sexual activity. He had pain and discomfort with the injection as well as bruising at the injection site. He never had any dysuria or gross hematuria. Pain/discomfort as well as bruising has now since resolved. Now here today to discuss additional treatment options. Patient had previously tried sildenafil which was effective but he had significant side effects including headaches, nasal and sinus congestion. He was using tadalafil daily as well as taking an on-demand after that which was effective for a while but that eventually lost its responsiveness as well. He was then transition to ICI is due to cost and not being able to present on treatment days for weekly consecutive treatments that he would not be able to pursue low intensity shockwave treatment.   06/05/2021  Patient comes in today primarily to discuss the options of erectile dysfunction treatment. Wanted to discuss inflatable penile prosthesis. He has been taking tadalafil as needed and this works moderately for him. He feels like his girth has gotten less. Length may be a little bit worse as well. However, still able to obtain an erection with tadalafil. Did not really give Trimix much of a try. May want to try this again.   12/16/2021  Patient went to urgent care and was treated for UTI. He was having frequency, urgency, dysuria. He was treated with Macrobid first and then after completion his symptoms returned 3 days later. He was started on Bactrim and he has been on this for 2 days. Urinary symptoms are starting to improve. He had a PSA checked on 9/13 around the time of his initial symptoms and it was 7.27. Urinalysis today with 3-10 red blood cell, 6-10 WBC, trace leukocyte, few  bacteria. He continues on Bactrim. He was prescribed 7 days. Continues to do well with tadalafil for erectile dysfunction.   02/24/2022  Patient completed antibiotics. He feels like his dysuria has improved significantly. Maybe a little bit of tingling at the tip of the penis that persist. Has not had an updated PSA. Needs that today. Tadalafil working well for his erectile dysfunction. No significant voiding complaints.   04/28/2022:  Patient presents acutely with concerns of recurrence of UTI. He is on no medications by urology at this time. In the past 2 days, patient endorses recurrence of dysuria throughout his voids, and worsened towards the end of urination. This is associated with minimal intermittent nausea. He denies dysuria, suprapubic discomfort, gross hematuria, flank pain, fever/chills, vomiting. He is anxious due to prior bout of UTI necessitating several rounds of antibiotic and enduring for several months. He inquires about STI screening as well. Of note, patient was recently started on Jardiance.   05/06/2022:  Patient returns acutely with concerns of continued dysuria. He has completed his course of doxycycline for recent UTI concern. He was initially prescribed on empiric Keflex, but denied improvement. Urine culture at last visit with more than 3 organisms grown. G/C negative as well. Today, patient states that he may have seen blood in his urine a few days ago, without recurrence. He does have intermittent suprapubic discomfort, and endorses continued mild dysuria throughout his voids, and worse at the end. He denies fever/chills, nausea/vomiting.   06/17/2022:  Patient presents for 6-week follow-up on his dysuria. He has completed his extended  1 month course of Bactrim. Today, patient continues to endorse interim dysuria, though he is not consistent in explaining if his dysuria is upon initiating his stream, terminal, or throughout his void. His dysuria is worsened with sitting down to  urinate versus standing. He endorses increased frequency when he increases his carb intake, mainly with bread. He does not experience urgency. He further endorses variable force of stream and nocturia (0-4) based on increased carb intake. He continues on Jardiance.     ALLERGIES: None   MEDICATIONS: Metformin Hcl  Aspir 81  Fish Oil 1 PO Daily  Jardiance  Xanax 0.5 MG Oral Tablet Oral     GU PSH: Cystoscopy - 2020, 2020 Locm 300-399Mg /Ml Iodine,1Ml - 2020 Penile Injection - 01/01/2021       PSH Notes: External Ear Surgery  Distal urethroplasty as a child   NON-GU PSH: Lumbar Laminectomy Outer Ear Surgery Procedure - 2011     GU PMH: Dysuria - 05/06/2022, (Stable), - 04/28/2022, - 02/24/2022 (Stable), - 12/16/2021, - 2018 Suprapubic pain - 05/06/2022 ED due to arterial insufficiency - 02/24/2022, - 12/16/2021, - 06/05/2021, - 02/10/2021, - 01/01/2021, - 12/16/2020, Erectile dysfunction due to arterial insufficiency, - 2014 Elevated PSA - 02/24/2022, - 12/16/2021 Encounter for Prostate Cancer screening - 02/24/2022, (Stable), - 12/16/2021 (Stable), - 12/16/2020, Prostate cancer screening, - 2014 Acute prostatitis - 12/16/2021, - 2018 BPH w/LUTS - 12/16/2021, - 06/05/2021, - 12/16/2020 Urinary Frequency - 06/05/2021 Hypospadias, penile - 12/16/2020, - 2020 Nocturia - 12/16/2020 Renal calculus, 1 mm nonobstructing right renal stone - 2020 Gross hematuria - 2020 History of urolithiasis - 2020 Bladder-neck stenosis/contracture - 2018      PMH Notes:  2009-09-17 16:28:31 - Note: Normal Routine History And Physical Adult  gastric ulcer   NON-GU PMH: Unspecified urethral stricture, male, meatal - 2020 Anxiety, Anxiety (Symptom) - 2014 Personal history of other specified conditions, History of heartburn - 2014 Diabetes Type 2 Hypertension    Immunizations: None   FAMILY HISTORY: Father Deceased At Age24 ___ - Runs In Family mother deceased at age 14 - Mother nephrolithiasis - Runs In Family     Notes: COPD   SOCIAL HISTORY: Marital Status: Single Drinks 1 drink per week.  Drinks 2 caffeinated drinks per day.     Notes: Marital History - Single, Tobacco Use, Alcohol Use, Caffeine Use   REVIEW OF SYSTEMS:    GU Review Male:   Patient reports frequent urination, burning/ pain with urination, and get up at night to urinate. Patient denies leakage of urine, stream starts and stops, trouble starting your stream, have to strain to urinate , erection problems, and penile pain.  Gastrointestinal (Upper):   Patient denies nausea, vomiting, and indigestion/ heartburn.  Gastrointestinal (Lower):   Patient denies diarrhea and constipation.  Constitutional:   Patient denies weight loss, fever, fatigue, and night sweats.  Skin:   Patient denies skin rash/ lesion and itching.  Eyes:   Patient denies blurred vision and double vision.  Ears/ Nose/ Throat:   Patient denies sore throat and sinus problems.  Hematologic/Lymphatic:   Patient denies swollen glands and easy bruising.  Cardiovascular:   Patient denies leg swelling and chest pains.  Respiratory:   Patient denies cough and shortness of breath.  Endocrine:   Patient denies excessive thirst.  Musculoskeletal:   Chronic from previous surgery.  Patient reports back pain. Patient denies joint pain.  Neurological:   Patient denies headaches and dizziness.  Psychologic:   Patient denies depression and anxiety.  Notes: Variable force of stream    VITAL SIGNS:      06/17/2022 03:03 PM  BP 158/89 mmHg  Pulse 74 /min  Temperature 97.8 F / 36.5 C   MULTI-SYSTEM PHYSICAL EXAMINATION:    Constitutional: Well-nourished. No physical deformities. Normally developed. Good grooming.  Respiratory: No labored breathing, no use of accessory muscles. Lungs clear to auscultation bilaterally. No wheezing, Rales.  Cardiovascular: Normal temperature, normal extremity pulses, no swelling. Regular rate and rhythm. No murmurs, gallops.  Skin: No paleness,  no jaundice, no cyanosis.  Neurologic / Psychiatric: Oriented to time, oriented to place, oriented to person. No depression, no anxiety, no agitation.  Gastrointestinal: No mass, no suprapubic or bilateral CVA tenderness, no rigidity, non obese abdomen.      Complexity of Data:  Source Of History:  Patient, Medical Record Summary  Records Review:   Previous Doctor Records, Previous Patient Records  Urine Test Review:   Urinalysis   02/24/22 12/16/20  PSA  Total PSA 1.09 ng/mL 2.16 ng/mL    06/17/22  Urinalysis  Urine Appearance Clear   Urine Color Straw   Urine Glucose Invalid mg/dL  Urine Bilirubin Neg mg/dL  Urine Ketones Neg mg/dL  Urine Specific Gravity 1.010   Urine Blood Neg ery/uL  Urine pH 6.0   Urine Protein Neg mg/dL  Urine Urobilinogen 0.2 mg/dL  Urine Nitrites Neg   Urine Leukocyte Esterase Neg leu/uL   PROCEDURES:          Urinalysis - 81003 Dipstick Dipstick Cont'd  Color: Straw Bilirubin: Neg  Appearance: Clear Ketones: Neg  Specific Gravity: 1.010 Blood: Neg  pH: 6.0 Protein: Neg  Glucose: Invalid Urobilinogen: 0.2    Nitrites: Neg    Leukocyte Esterase: Neg    Notes:      ASSESSMENT:      ICD-10 Details  1 GU:   BPH w/LUTS - N40.1 Chronic, Stable  2   Dysuria - R30.0 Chronic, Stable  3   Nocturia - R35.1 Chronic, Stable  4   Urinary Frequency - R35.0 Chronic, Worsening   PLAN:           Orders Labs Urine Culture          Schedule Return Visit/Planned Activity: Next Available Appointment - Schedule Surgery          Document Letter(s):  Created for Patient: Clinical Summary         Notes:   Today, UA WNL. Patient has completed 1 month of Bactrim, but continues to endorse interim dysuria, increased frequency.   Reviewed with patient his urologist recommendation for cystoscopy due to his continued dysuria. Patient is anxious, as he had to be sedated for cystoscopy in 2020 due to pain. We reassured patient that he would be asleep for the  procedure, as it will be done in an operative setting. Patient voiced understanding. Urine culture sent for preoperative clearance.   We reviewed procedure in detail, and answered all of patient's questions to the best of our abilities. We will proceed with completing a surgical posting sheet. Patient understands he will be contacted by his urologist scheduler to coordinate the procedure. Interim return to clinic was advised for worsening presentation. Patient voiced understanding and is amenable to this plan.   Surgical posting sheet submitted to scheduler.        Next Appointment:      Next Appointment: 07/17/2022 12:15 PM    Appointment Type: Surgery     Location: [facility] -  [id number]    Provider: Modena Slater, Radene Knee, M.D.    Reason for Visit: OP NE CYSTO      Signed by Buzzy Han, PA-C on 06/23/22 at 12:05 PM (EDT

## 2022-07-17 NOTE — Anesthesia Postprocedure Evaluation (Signed)
Anesthesia Post Note  Patient: Jacob [name]  Procedure(s) Performed: CYSTOSCOPY, BILATERAL RETROGRADE PYELOGRAM (Ureter)     Patient location during evaluation: PACU Anesthesia Type: General Level of consciousness: awake and alert Pain management: pain level controlled Vital Signs Assessment: post-procedure vital signs reviewed and stable Respiratory status: spontaneous breathing, nonlabored ventilation, respiratory function stable and patient connected to nasal cannula oxygen Cardiovascular status: blood pressure returned to baseline and stable Postop Assessment: no apparent nausea or vomiting Anesthetic complications: no  No notable events documented.  Last Vitals:  Vitals:   07/17/22 1305 07/17/22 1333  BP: 111/84 110/75  Pulse: 76 66  Resp: 10 14  Temp:  36.9 C  SpO2: 95% 96%    Last Pain:  Vitals:   07/17/22 1333  TempSrc:   PainSc: 0-No pain                 Trevor Iha

## 2022-07-17 NOTE — Transfer of Care (Signed)
Immediate Anesthesia Transfer of Care Note  Patient: Jacob [name]  Procedure(s) Performed: CYSTOSCOPY, BILATERAL RETROGRADE PYELOGRAM (Ureter)  Patient Location: PACU  Anesthesia Type:General  Level of Consciousness: awake and patient cooperative  Airway & Oxygen Therapy: Patient Spontanous Breathing  Post-op Assessment: Report given to RN and Post -op Vital signs reviewed and stable  Post vital signs: Reviewed and stable  Last Vitals:  Vitals Value Taken Time  BP 137/88 07/17/22 1248  Temp    Pulse 64 07/17/22 1250  Resp 11 07/17/22 1250  SpO2 95 % 07/17/22 1250  Vitals shown include unvalidated device data.  Last Pain:  Vitals:   07/17/22 1039  TempSrc: Oral  PainSc: 0-No pain      Patients Stated Pain Goal: 7 (07/17/22 1039)  Complications: No notable events documented.

## 2022-07-17 NOTE — Anesthesia Procedure Notes (Signed)
Procedure Name: LMA Insertion Date/Time: 07/17/2022 12:17 PM  Performed by: Bishop Limbo, CRNAPre-anesthesia Checklist: Patient identified, Emergency Drugs available, Suction available and Patient being monitored Patient Re-evaluated:Patient Re-evaluated prior to induction Oxygen Delivery Method: Circle System Utilized Preoxygenation: Pre-oxygenation with 100% oxygen Induction Type: IV induction Ventilation: Mask ventilation without difficulty LMA: LMA inserted LMA Size: 4.0 Number of attempts: 1 Placement Confirmation: positive ETCO2 Tube secured with: Tape Dental Injury: Teeth and Oropharynx as per pre-operative assessment  Comments: LMA by Job Founds, SRNA-under CRNA supervision.

## 2022-07-21 ENCOUNTER — Encounter (HOSPITAL_BASED_OUTPATIENT_CLINIC_OR_DEPARTMENT_OTHER): Payer: Self-pay | Admitting: Urology

## 2022-07-23 NOTE — Patient Instructions (Addendum)
    Go to the lab and give a urine sample.   B12 injection today.  Your A1c is 7.5%   Think about seeing a nutritionist    Medications changes include :   none     Return for follow up PCP in three month, B12 injection in one month.

## 2022-07-23 NOTE — Progress Notes (Unsigned)
Subjective:    Patient ID: Jacob [name], male    DOB: [birth year], 57 y.o.   MRN: [id number]      HPI Jacob [name] is here for  Chief Complaint  Patient presents with   Hyperglycemia    Requesting to have A1C check and wants to rule out UTI      Elevated sugars - 200-300 - his sugars are all over the place.  Two weeks ago it was 64 - he had eaten a brownie that day.  He usually does not eat a lot sugary foods.  He eats a lot of bread.  He does drink a glass of wine at night.    Recently had urethra dilated.  Was having pain and blood after the procedure.  The blood resolved.  Was having uti's  - has some dysuria at times.        Currently taking: jardiance 25 mg daily, metformin xr 2000 mg daily    Lab Results  Component Value Date   HGBA1C 7.2 (A) 04/10/2022    A1c today is 7.5%  Has not been exercising regularly.   Medications and allergies reviewed with patient and updated if appropriate.  Current Outpatient Medications on File Prior to Visit  Medication Sig Dispense Refill   ALPRAZolam (XANAX) 0.5 MG tablet TAKE 1 TO 2 TABLETS BY MOUTH AT BEDTIME AS NEEDED 60 tablet 5   Ascorbic Acid (VITAMIN C PO) Take 500 mg by mouth daily.     aspirin EC 81 MG tablet Take 1 tablet (81 mg total) by mouth daily. 90 tablet 11   cyanocobalamin (VITAMIN B12) 1000 MCG tablet Take 1 tablet (1,000 mcg total) by mouth daily. 90 tablet 1   empagliflozin (JARDIANCE) 25 MG TABS tablet Take 1 tablet (25 mg total) by mouth daily before breakfast. 90 tablet 3   HYDROcodone-acetaminophen (NORCO/VICODIN) 5-325 MG tablet Take 1 tablet by mouth every 4 (four) hours as needed for moderate pain. 6 tablet 0   metFORMIN (GLUCOPHAGE-XR) 500 MG 24 hr tablet TAKE 4 TABLETS BY MOUTH ONCE DAILY WITH BREAKFAST 360 tablet 3   Multiple Vitamin (MULTIVITAMIN WITH MINERALS) TABS tablet Take 1 tablet by mouth daily.     Omega-3 Fatty Acids (FISH OIL PO) Take 1 capsule by mouth daily.     pantoprazole  (PROTONIX) 40 MG tablet Take 1 tablet (40 mg total) by mouth 2 (two) times daily. 180 tablet 3   ramipril (ALTACE) 10 MG capsule Take 1 capsule (10 mg total) by mouth daily. 90 capsule 3   rosuvastatin (CRESTOR) 10 MG tablet Take 1 tablet (10 mg total) by mouth daily. 90 tablet 3   No current facility-administered medications on file prior to visit.    Review of Systems  Constitutional:  Positive for fatigue. Negative for fever.  Gastrointestinal:  Negative for abdominal pain.  Genitourinary:  Positive for dysuria. Negative for frequency and hematuria (microscopic).  Neurological:  Negative for light-headedness and headaches.       Objective:   Vitals:   07/24/22 1500  BP: 130/88  Pulse: 65  Temp: 98.4 F (36.9 C)  SpO2: 99%   BP Readings from Last 3 Encounters:  07/24/22 130/88  07/17/22 110/75  07/03/22 (!) 140/78   Wt Readings from Last 3 Encounters:  07/24/22 178 lb (80.7 kg)  07/17/22 175 lb 1.6 oz (79.4 kg)  07/03/22 179 lb (81.2 kg)   Body mass index is 24.83 kg/m.    Physical Exam Constitutional:  General: He is not in acute distress.    Appearance: Normal appearance. He is not ill-appearing.  HENT:     Head: Normocephalic and atraumatic.  Eyes:     Conjunctiva/sclera: Conjunctivae normal.  Cardiovascular:     Rate and Rhythm: Normal rate and regular rhythm.     Heart sounds: Normal heart sounds.  Pulmonary:     Effort: Pulmonary effort is normal. No respiratory distress.     Breath sounds: Normal breath sounds. No wheezing or rales.  Musculoskeletal:     Right lower leg: No edema.     Left lower leg: No edema.  Skin:    General: Skin is warm and dry.     Findings: No rash.  Neurological:     Mental Status: He is alert. Mental status is at baseline.  Psychiatric:        Mood and Affect: Mood normal.            Assessment & Plan:    See Problem List for Assessment and Plan of chronic medical problems.     Hypertension: Chronic BP borderline high  No change in medications today Continue ramipril 10 mg daily

## 2022-07-24 ENCOUNTER — Ambulatory Visit: Payer: 59 | Admitting: Internal Medicine

## 2022-07-24 ENCOUNTER — Encounter: Payer: Self-pay | Admitting: Internal Medicine

## 2022-07-24 VITALS — BP 130/88 | HR 65 | Temp 98.4°F | Ht 71.0 in | Wt 178.0 lb

## 2022-07-24 DIAGNOSIS — E0865 Diabetes mellitus due to underlying condition with hyperglycemia: Secondary | ICD-10-CM

## 2022-07-24 DIAGNOSIS — E538 Deficiency of other specified B group vitamins: Secondary | ICD-10-CM

## 2022-07-24 DIAGNOSIS — R300 Dysuria: Secondary | ICD-10-CM | POA: Diagnosis not present

## 2022-07-24 LAB — URINALYSIS, ROUTINE W REFLEX MICROSCOPIC
Bilirubin Urine: NEGATIVE
Hgb urine dipstick: NEGATIVE
Ketones, ur: NEGATIVE
Leukocytes,Ua: NEGATIVE
Nitrite: NEGATIVE
Specific Gravity, Urine: 1.01 (ref 1.000–1.030)
Total Protein, Urine: NEGATIVE
Urine Glucose: >=1000 — AB
Urobilinogen, UA: 0.2 (ref 0.0–1.0)
pH: 6.5 (ref 5.0–8.0)

## 2022-07-24 LAB — POCT GLYCOSYLATED HEMOGLOBIN (HGB A1C)
HbA1c POC (<> result, manual entry): 7.5 % (ref 4.0–5.6)
HbA1c, POC (controlled diabetic range): 7.5 % — AB (ref 0.0–7.0)
HbA1c, POC (prediabetic range): 7.5 % — AB (ref 5.7–6.4)
Hemoglobin A1C: 7.5 % — AB (ref 4.0–5.6)

## 2022-07-24 LAB — MICROALBUMIN / CREATININE URINE RATIO
Creatinine,U: 50.9 mg/dL
Microalb Creat Ratio: 1.4 mg/g (ref 0.0–30.0)
Microalb, Ur: 0.7 mg/dL (ref 0.0–1.9)

## 2022-07-24 MED ORDER — CYANOCOBALAMIN 1000 MCG/ML IJ SOLN
1000.0000 ug | Freq: Once | INTRAMUSCULAR | Status: AC
Start: 2022-07-24 — End: 2022-07-24
  Administered 2022-07-24: 1000 ug via INTRAMUSCULAR

## 2022-07-24 NOTE — Assessment & Plan Note (Signed)
Chronic A1c today 7.5%  <-- 7.2% Deferred additional medication - wants to work on lifestyle Discussed seeing a nutritionist - he deferred for now Advised decreasing carbs Continue jardiance 25 mg daily, metformin xr 2000 mg daily Get back to regular exercise

## 2022-07-25 NOTE — Assessment & Plan Note (Signed)
Acute  Have some intermittent dysuria Recently had cystoscopy and urethral dilation Check ua, ucx

## 2022-07-26 ENCOUNTER — Other Ambulatory Visit: Payer: Self-pay | Admitting: Internal Medicine

## 2022-07-26 LAB — URINE CULTURE

## 2022-07-27 MED ORDER — NITROFURANTOIN MONOHYD MACRO 100 MG PO CAPS
100.0000 mg | ORAL_CAPSULE | Freq: Two times a day (BID) | ORAL | 0 refills | Status: DC
Start: 2022-07-27 — End: 2023-01-27

## 2022-07-27 NOTE — Addendum Note (Signed)
Addended by: Pincus Sanes on: 07/27/2022 07:05 AM   Modules accepted: Orders

## 2022-07-29 ENCOUNTER — Telehealth: Payer: Self-pay | Admitting: Internal Medicine

## 2022-07-29 NOTE — Telephone Encounter (Signed)
Patient would like a referral for a endocrinology for his diabetes. He would like the referral to be sent to [facility]. Their fax number is [phone].  Patient was seen 07/24/2022 by Dr. Lawerance Bach and 04/10/2022 by Dr. Jonny Ruiz for his A1C. He declined scheduling an appointment. Best callback is [phone].

## 2022-07-29 NOTE — Telephone Encounter (Signed)
Called Pt and got appt scheduled.

## 2022-08-03 ENCOUNTER — Ambulatory Visit: Payer: 59 | Admitting: Internal Medicine

## 2022-08-03 VITALS — BP 124/76 | HR 70 | Temp 98.1°F | Ht 71.0 in | Wt 181.0 lb

## 2022-08-03 DIAGNOSIS — R252 Cramp and spasm: Secondary | ICD-10-CM | POA: Diagnosis not present

## 2022-08-03 DIAGNOSIS — E0865 Diabetes mellitus due to underlying condition with hyperglycemia: Secondary | ICD-10-CM | POA: Diagnosis not present

## 2022-08-03 DIAGNOSIS — N390 Urinary tract infection, site not specified: Secondary | ICD-10-CM

## 2022-08-03 DIAGNOSIS — Z7984 Long term (current) use of oral hypoglycemic drugs: Secondary | ICD-10-CM

## 2022-08-03 DIAGNOSIS — E7800 Pure hypercholesterolemia, unspecified: Secondary | ICD-10-CM

## 2022-08-03 DIAGNOSIS — Z0001 Encounter for general adult medical examination with abnormal findings: Secondary | ICD-10-CM | POA: Diagnosis not present

## 2022-08-03 DIAGNOSIS — I10 Essential (primary) hypertension: Secondary | ICD-10-CM

## 2022-08-03 MED ORDER — CYCLOBENZAPRINE HCL 5 MG PO TABS
5.0000 mg | ORAL_TABLET | Freq: Every day | ORAL | 3 refills | Status: DC
Start: 2022-08-03 — End: 2023-03-08

## 2022-08-03 NOTE — Assessment & Plan Note (Signed)
Lab Results  Component Value Date   LDLCALC 60 12/10/2021   Stable, pt to continue current statin crestor 10 mg qd

## 2022-08-03 NOTE — Assessment & Plan Note (Signed)
With recent abnormal urine cx apr 26 - encouraged pt to start nitrofurantoin as per Dr Lawerance Bach

## 2022-08-03 NOTE — Assessment & Plan Note (Signed)
Chronic persistent, for flexeril 5 mg qhs prn

## 2022-08-03 NOTE — Patient Instructions (Addendum)
Please have your Shingrix (shingles) shots done at your local pharmacy.  Please remember to call for your yearly eye exam  Please take all new medication as prescribed - the muscle relaxer at bedtime  Ok to start the antibiotic as prescribed, as treating the infection will also help the sugars come down  Please continue all other medications as before, and refills have been done if requested.  Please have the pharmacy call with any other refills you may need.  Please continue your efforts at being more active, low cholesterol diet, and weight control.  You are otherwise up to date with prevention measures today.  Please keep your appointments with your specialists as you may have planned  You will be contacted regarding the referral for: Endocrinology  .Please make an Appointment to return in 6 months, or sooner if needed

## 2022-08-03 NOTE — Assessment & Plan Note (Signed)
Age and sex appropriate education and counseling updated with regular exercise and diet Referrals for preventative services - pt states will call for eye exam soon Immunizations addressed - for shingrix at pharmacy Smoking counseling  - none needed Evidence for depression or other mood disorder - stabe anxiety panic depression, declines need for change in tx Most recent labs reviewed. I have personally reviewed and have noted: 1) the patient's medical and social history 2) The patient's current medications and supplements 3) The patient's height, weight, and BMI have been recorded in the chart

## 2022-08-03 NOTE — Progress Notes (Signed)
Patient ID: Jacob [name], male   DOB: July 09, 1965, 57 y.o.   MRN: [id number]         Chief Complaint:: wellness exam and dm, uti, leg cramps at night, htn, hld,        HPI:  Jacob [name] is a 57 y.o. male here for wellness exam ; for shingrix at pharmacy, pt states will call for yearly eye exam, o/w up to date                        Also declines labs today. Pt denies chest pain, increased sob or doe, wheezing, orthopnea, PND, increased LE swelling, palpitations, dizziness or syncope.   Pt denies polydipsia, polyuria, or new focal neuro s/s.    Pt denies fever, wt loss, night sweats, loss of appetite, or other constitutional symptoms  Recent urine culture abnormal for UTI apr 26 but pt has not yet started nitrofurantoin.  Has hx of what sounds like urethral stricture tx recently per urology with dilation.  Denies urinary symptoms such as dysuria, frequency, urgency, flank pain, hematuria or n/v, fever, chills.  Does have significant leg cramping most nights, hard to sleep, still working a physical job.     Wt Readings from Last 3 Encounters:  08/03/22 181 lb (82.1 kg)  07/24/22 178 lb (80.7 kg)  07/17/22 175 lb 1.6 oz (79.4 kg)   BP Readings from Last 3 Encounters:  08/03/22 124/76  07/24/22 130/88  07/17/22 110/75   Immunization History  Administered Date(s) Administered   Influenza Inj Mdck Quad Pf 12/24/2018   Influenza,inj,Quad PF,6+ Mos 01/11/2013, 11/19/2016, 11/26/2017, 01/22/2020   Influenza-Unspecified 12/29/2015, 01/15/2021, 12/10/2021   Janssen (J&J) SARS-COV-2 Vaccination 10/04/2019   PFIZER(Purple Top)SARS-COV-2 Vaccination 03/09/2020   Pneumococcal Polysaccharide-23 01/11/2013   Tdap 06/21/2001, 09/22/2012   Health Maintenance Due  Topic Date Due   Zoster Vaccines- Shingrix (1 of 2) Never done   OPHTHALMOLOGY EXAM  07/29/2022      Past Medical History:  Diagnosis Date   Allergic rhinitis    Anxiety    Arthritis    Cholesteatoma of right ear    Diabetes  mellitus (HCC)    bordreline   Dysuria    Enlarged prostate    pt unaware   GERD (gastroesophageal reflux disease)    Headache    Hearing loss    Right ear   Hernia of abdominal wall    History of gross hematuria    History of head injury    History of kidney stones    History of palpitations    Hyperlipidemia 10/18/2014   Hypertension    Insomnia    Lack of concentration    Nasal fracture    Several   Otitis media 11/10/2017   Bilateral   Panic attacks    Syncope    Vertigo    Past Surgical History:  Procedure Laterality Date   CHOLECYSTECTOMY     CYSTOSCOPY N/A 05/10/2018   Procedure: CYSTOSCOPY WITH EXAM UNDER ANESTHESIA;  Surgeon: Rene Paci, MD;  Location: [facility];  Service: Urology;  Laterality: N/A;  ONLY NEEDS 30 MIN   CYSTOSCOPY N/A 07/17/2022   Procedure: CYSTOSCOPY, BILATERAL RETROGRADE PYELOGRAM;  Surgeon: Crista Elliot, MD;  Location: [facility];  Service: Urology;  Laterality: N/A;   HYPOSPADIAS CORRECTION N/A 1978   INNER EAR SURGERY     right   LUMBAR DISC SURGERY  MASTOIDECTOMY Right    NOSE SURGERY     Fracture   tubes in bil ears     UPPER GASTROINTESTINAL ENDOSCOPY     UPPER GI ENDOSCOPY  01/09/2012   Esophageal dilation    reports that he has never smoked. He has never used smokeless tobacco. He reports current alcohol use of about 5.0 standard drinks of alcohol per week. He reports that he does not use drugs. family history includes ADD / ADHD in his son; Anxiety disorder in his cousin, maternal aunt, maternal grandmother, maternal uncle, and mother; COPD in his mother; Cancer in his father; Diabetes in his maternal grandmother and paternal grandmother; Drug abuse in his son; Heart attack in his brother; Heart disease in his father; Hypertension in his mother; Lung cancer in his maternal uncle; OCD in his maternal aunt. No Known Allergies Current Outpatient Medications on File Prior to Visit   Medication Sig Dispense Refill   ALPRAZolam (XANAX) 0.5 MG tablet TAKE 1 TO 2 TABLETS BY MOUTH AT BEDTIME AS NEEDED 60 tablet 5   Ascorbic Acid (VITAMIN C PO) Take 500 mg by mouth daily.     aspirin EC 81 MG tablet Take 1 tablet (81 mg total) by mouth daily. 90 tablet 11   cyanocobalamin (VITAMIN B12) 1000 MCG tablet Take 1 tablet (1,000 mcg total) by mouth daily. 90 tablet 1   empagliflozin (JARDIANCE) 25 MG TABS tablet Take 1 tablet (25 mg total) by mouth daily before breakfast. 90 tablet 3   HYDROcodone-acetaminophen (NORCO/VICODIN) 5-325 MG tablet Take 1 tablet by mouth every 4 (four) hours as needed for moderate pain. 6 tablet 0   metFORMIN (GLUCOPHAGE-XR) 500 MG 24 hr tablet TAKE 4 TABLETS BY MOUTH ONCE DAILY WITH BREAKFAST 360 tablet 3   Multiple Vitamin (MULTIVITAMIN WITH MINERALS) TABS tablet Take 1 tablet by mouth daily.     nitrofurantoin, macrocrystal-monohydrate, (MACROBID) 100 MG capsule Take 1 capsule (100 mg total) by mouth 2 (two) times daily. 14 capsule 0   Omega-3 Fatty Acids (FISH OIL PO) Take 1 capsule by mouth daily.     pantoprazole (PROTONIX) 40 MG tablet Take 1 tablet (40 mg total) by mouth 2 (two) times daily. 180 tablet 3   ramipril (ALTACE) 10 MG capsule Take 1 capsule (10 mg total) by mouth daily. 90 capsule 3   rosuvastatin (CRESTOR) 10 MG tablet Take 1 tablet (10 mg total) by mouth daily. 90 tablet 3   No current facility-administered medications on file prior to visit.        ROS:  All others reviewed and negative.  Objective        PE:  BP 124/76 (BP Location: Left Arm, Patient Position: Sitting, Cuff Size: Normal)   Pulse 70   Temp 98.1 F (36.7 C) (Oral)   Ht 5\' 11"  (1.803 m)   Wt 181 lb (82.1 kg)   SpO2 99%   BMI 25.24 kg/m                 Constitutional: Pt appears in NAD               HENT: Head: NCAT.                Right Ear: External ear normal.                 Left Ear: External ear normal.                Eyes: . Pupils are equal,  round,  and reactive to light. Conjunctivae and EOM are normal               Nose: without d/c or deformity               Neck: Neck supple. Gross normal ROM               Cardiovascular: Normal rate and regular rhythm.                 Pulmonary/Chest: Effort normal and breath sounds without rales or wheezing.                Abd:  Soft, NT, ND, + BS, no organomegaly               Neurological: Pt is alert. At baseline orientation, motor grossly intact               Skin: Skin is warm. No rashes, no other new lesions, LE edema - none               Psychiatric: Pt behavior is normal without agitation   Micro: none  Cardiac tracings I have personally interpreted today:  none  Pertinent Radiological findings (summarize): none   Lab Results  Component Value Date   WBC 5.5 07/03/2022   HGB 16.7 07/17/2022   HCT 49.0 07/17/2022   PLT 259.0 07/03/2022   GLUCOSE 155 (H) 07/17/2022   CHOL 140 12/10/2021   TRIG 109.0 12/10/2021   HDL 57.30 12/10/2021   LDLDIRECT 102.0 11/23/2016   LDLCALC 60 12/10/2021   ALT 41 07/03/2022   AST 22 07/03/2022   NA 140 07/17/2022   K 4.5 07/17/2022   CL 101 07/17/2022   CREATININE 1.00 07/17/2022   BUN 12 07/17/2022   CO2 26 07/03/2022   TSH 1.69 07/03/2022   PSA 7.27 (H) 12/10/2021   INR 0.96 12/11/2017   HGBA1C 7.5 (A) 07/24/2022   HGBA1C 7.5 07/24/2022   HGBA1C 7.5 (A) 07/24/2022   HGBA1C 7.5 (A) 07/24/2022   MICROALBUR 0.7 07/24/2022   Assessment/Plan:  Jacob [name] is a 57 y.o. White or Caucasian [1] male with  has a past medical history of Allergic rhinitis, Anxiety, Arthritis, Cholesteatoma of right ear, Diabetes mellitus (HCC), Dysuria, Enlarged prostate, GERD (gastroesophageal reflux disease), Headache, Hearing loss, Hernia of abdominal wall, History of gross hematuria, History of head injury, History of kidney stones, History of palpitations, Hyperlipidemia (10/18/2014), Hypertension, Insomnia, Lack of concentration, Nasal fracture, Otitis media  (11/10/2017), Panic attacks, Syncope, and Vertigo.  Encounter for well adult exam with abnormal findings Age and sex appropriate education and counseling updated with regular exercise and diet Referrals for preventative services - pt states will call for eye exam soon Immunizations addressed - for shingrix at pharmacy Smoking counseling  - none needed Evidence for depression or other mood disorder - stabe anxiety panic depression, declines need for change in tx Most recent labs reviewed. I have personally reviewed and have noted: 1) the patient's medical and social history 2) The patient's current medications and supplements 3) The patient's height, weight, and BMI have been recorded in the chart   Diabetes mellitus due to underlying condition with hyperglycemia, without long-term current use of insulin (HCC) Lab Results  Component Value Date   HGBA1C 7.5 (A) 07/24/2022   HGBA1C 7.5 07/24/2022   HGBA1C 7.5 (A) 07/24/2022   HGBA1C 7.5 (A) 07/24/2022   Uncontrolled, goal A1c < 7, pt declines change in  tx,  pt to continue current medical treatment jardiance 25 mg qd, metfomrin ER 500 mg - 4 qd, and refer Endo per pt request   Essential hypertension, benign BP Readings from Last 3 Encounters:  08/03/22 124/76  07/24/22 130/88  07/17/22 110/75   Stable, pt to continue medical treatment altac 10 mg qd   Hyperlipidemia Lab Results  Component Value Date   LDLCALC 60 12/10/2021   Stable, pt to continue current statin crestor 10 mg qd   Leg cramping Chronic persistent, for flexeril 5 mg qhs prn  UTI (urinary tract infection) With recent abnormal urine cx apr 26 - encouraged pt to start nitrofurantoin as per Dr Lawerance Bach  Followup: Return in about 6 months (around 02/03/2023).  Oliver Barre, MD 08/03/2022 8:15 PM [facility]

## 2022-08-03 NOTE — Assessment & Plan Note (Signed)
BP Readings from Last 3 Encounters:  08/03/22 124/76  07/24/22 130/88  07/17/22 110/75   Stable, pt to continue medical treatment altac 10 mg qd

## 2022-08-03 NOTE — Assessment & Plan Note (Signed)
Lab Results  Component Value Date   HGBA1C 7.5 (A) 07/24/2022   HGBA1C 7.5 07/24/2022   HGBA1C 7.5 (A) 07/24/2022   HGBA1C 7.5 (A) 07/24/2022   Uncontrolled, goal A1c < 7, pt declines change in tx,  pt to continue current medical treatment jardiance 25 mg qd, metfomrin ER 500 mg - 4 qd, and refer Endo per pt request

## 2022-09-01 ENCOUNTER — Other Ambulatory Visit: Payer: Self-pay

## 2022-09-01 ENCOUNTER — Ambulatory Visit: Payer: 59 | Admitting: Family Medicine

## 2022-09-01 VITALS — BP 148/92 | HR 66 | Ht 71.0 in | Wt 182.0 lb

## 2022-09-01 DIAGNOSIS — M5441 Lumbago with sciatica, right side: Secondary | ICD-10-CM

## 2022-09-01 DIAGNOSIS — M25511 Pain in right shoulder: Secondary | ICD-10-CM | POA: Diagnosis not present

## 2022-09-01 DIAGNOSIS — M5442 Lumbago with sciatica, left side: Secondary | ICD-10-CM | POA: Diagnosis not present

## 2022-09-01 DIAGNOSIS — G8929 Other chronic pain: Secondary | ICD-10-CM | POA: Diagnosis not present

## 2022-09-01 NOTE — Patient Instructions (Addendum)
Thank you for coming in today.   You received an injection today. Seek immediate medical attention if the joint becomes red, extremely painful, or is oozing fluid.   I've referred you to Physical Therapy.  Let us know if you don't hear from them in one week.   Check back in 3 months or let me know if I can refer you to orthopedic surgery

## 2022-09-01 NOTE — Progress Notes (Signed)
Rubin Payor, PhD, LAT, ATC acting as a scribe for Clementeen Graham, MD.  Jacob [name] is a 57 y.o. male who presents to [facility] today for cont'd R shoulder pain. He had a surgical consultation w/ Dr. August Saucer at [facility] on 11/20/21, but the surgery scheduled for 11/24/21, was canceled. Pt was last seen by Dr. Denyse Amass on 06/08/22 and was given a R subacromial steroid injection. Today, pt reports prior steroid injection lasted until about 2 weeks ago. He notes he is going to wait until a year of service at his current job before able to consider surgery.    He also c/o LBP that's chronic in nature. He feels like there is a "knot" present in the R-side of his low back.  He has a history of chronic low back pain that is been worsening over the last several weeks.  Radiating pain: no LE numbness/tingling: no LE weakness: no Treatments tried: IBU  Dx imaging: 11/17/21 R shoulder MRI              11/10/18 R shoulder XR  Pertinent review of systems: No fevers or chills  Relevant historical information: Partial rotator cuff tear and tendinopathy right shoulder.   Exam:  BP (!) 148/92   Pulse 66   Ht 5\' 11"  (1.803 m)   Wt 182 lb (82.6 kg)   SpO2 98%   BMI 25.38 kg/m  General: Well Developed, well nourished, and in no acute distress.   MSK: Right shoulder: Normal appearing Nontender palpation.  Normal motion pain with abduction.  Lumbar spine nontender palpation midline normal lumbar motion.  Lower extremity strength is intact.  Lab and Radiology Results  Procedure: Real-time Ultrasound Guided Injection of right shoulder subacromial bursa Device: Philips Affiniti 50G Images permanently stored and available for review in PACS Verbal informed consent obtained.  Discussed risks and benefits of procedure. Warned about infection, bleeding, hyperglycemia damage to structures among others. Patient expresses understanding and agreement Time-out conducted.    Noted no overlying erythema, induration, or other signs of local infection.   Skin prepped in a sterile fashion.   Local anesthesia: Topical Ethyl chloride.   With sterile technique and under real time ultrasound guidance: 40 mg of Kenalog and 2 mL Marcaine injected into subacromial bursa. Fluid seen entering the bursa.   Completed without difficulty   Pain immediately resolved suggesting accurate placement of the medication.   Advised to call if fevers/chills, erythema, induration, drainage, or persistent bleeding.   Images permanently stored and available for review in the ultrasound unit.  Impression: Technically successful ultrasound guided injection.    CT which is lumbar spine visible on CT scan abdomen and pelvis April 2024 personally independently interpreted. Mild multilevel degenerative changes.  More significant right L5-S1 facet DJD present.    Assessment and Plan: 57 y.o. male with right shoulder pain thought to be due to subacromial bursitis and partial rotator cuff tear seen on prior MRI.  He was referred to Dr. August Saucer last year but was unable to proceed with surgery due to job situation.  He will be able to have surgery starting about September of this year.  Its about 3 months from now.  Repeat injection and on 30-month recheck consider referral to orthopedic surgery for surgical consultation.  Chronic low back pain with acute exacerbation.  I think the chronic pain is probably due to the degenerative changes seen on prior CT scan.  The acute episode of  back pain is thought to be due to muscle spasm and dysfunction.  He is a good candidate for physical therapy referral.   PDMP not reviewed this encounter. Orders Placed This Encounter  Procedures   [place] LIMITED JOINT SPACE STRUCTURES UP RIGHT(NO LINKED CHARGES)    Order Specific Question:   Reason for Exam (SYMPTOM  OR DIAGNOSIS REQUIRED)    Answer:   right shoulder pain    Order Specific Question:   Preferred imaging  location?    Answer:   [facility] referral to Physical Therapy    Referral Priority:   Routine    Referral Type:   Physical Medicine    Referral Reason:   Specialty Services Required    Requested Specialty:   Physical Therapy    Number of Visits Requested:   1   No orders of the defined types were placed in this encounter.    Discussed warning signs or symptoms. Please see discharge instructions. Patient expresses understanding.   The above documentation has been reviewed and is accurate and complete Clementeen Graham, M.D.

## 2022-09-16 ENCOUNTER — Other Ambulatory Visit: Payer: Self-pay | Admitting: Internal Medicine

## 2022-09-16 ENCOUNTER — Other Ambulatory Visit (INDEPENDENT_AMBULATORY_CARE_PROVIDER_SITE_OTHER): Payer: 59

## 2022-09-16 DIAGNOSIS — E0865 Diabetes mellitus due to underlying condition with hyperglycemia: Secondary | ICD-10-CM

## 2022-09-16 DIAGNOSIS — E559 Vitamin D deficiency, unspecified: Secondary | ICD-10-CM | POA: Diagnosis not present

## 2022-09-16 DIAGNOSIS — Z125 Encounter for screening for malignant neoplasm of prostate: Secondary | ICD-10-CM | POA: Diagnosis not present

## 2022-09-16 DIAGNOSIS — E538 Deficiency of other specified B group vitamins: Secondary | ICD-10-CM

## 2022-09-16 LAB — PSA: PSA: 2.06 ng/mL (ref 0.10–4.00)

## 2022-09-16 LAB — MICROALBUMIN / CREATININE URINE RATIO
Creatinine,U: 76.1 mg/dL
Microalb Creat Ratio: 0.9 mg/g (ref 0.0–30.0)
Microalb, Ur: <0.7 mg/dL (ref 0.0–1.9)

## 2022-09-16 LAB — LIPID PANEL
Cholesterol: 151 mg/dL (ref 0–200)
HDL: 54.6 mg/dL (ref >39.00)
LDL Cholesterol: 70 mg/dL (ref 0–99)
NonHDL: 96
Total CHOL/HDL Ratio: 3
Triglycerides: 128 mg/dL (ref 0.0–149.0)
VLDL: 25.6 mg/dL (ref 0.0–40.0)

## 2022-09-16 LAB — URINALYSIS, ROUTINE W REFLEX MICROSCOPIC
Bilirubin Urine: NEGATIVE
Hgb urine dipstick: NEGATIVE
Ketones, ur: NEGATIVE
Leukocytes,Ua: NEGATIVE
Nitrite: NEGATIVE
Specific Gravity, Urine: 1.015 (ref 1.000–1.030)
Total Protein, Urine: NEGATIVE
Urine Glucose: >=1000 — AB
Urobilinogen, UA: 0.2 (ref 0.0–1.0)
pH: 7 (ref 5.0–8.0)

## 2022-09-16 LAB — HEPATIC FUNCTION PANEL
ALT: 34 U/L (ref 0–53)
AST: 24 U/L (ref 0–37)
Albumin: 4.5 g/dL (ref 3.5–5.2)
Alkaline Phosphatase: 72 U/L (ref 39–117)
Bilirubin, Direct: 0.1 mg/dL (ref 0.0–0.3)
Total Bilirubin: 0.5 mg/dL (ref 0.2–1.2)
Total Protein: 7.4 g/dL (ref 6.0–8.3)

## 2022-09-16 LAB — BASIC METABOLIC PANEL
BUN: 13 mg/dL (ref 6–23)
CO2: 26 mEq/L (ref 19–32)
Calcium: 9.3 mg/dL (ref 8.4–10.5)
Chloride: 102 mEq/L (ref 96–112)
Creatinine, Ser: 0.99 mg/dL (ref 0.40–1.50)
GFR: 85.77 mL/min (ref >60.00)
Glucose, Bld: 140 mg/dL — ABNORMAL HIGH (ref 70–99)
Potassium: 4.8 mEq/L (ref 3.5–5.1)
Sodium: 140 mEq/L (ref 135–145)

## 2022-09-16 LAB — VITAMIN D 25 HYDROXY (VIT D DEFICIENCY, FRACTURES): VITD: 36.08 ng/mL (ref 30.00–100.00)

## 2022-09-16 LAB — HEMOGLOBIN A1C: Hgb A1c MFr Bld: 7.4 % — ABNORMAL HIGH (ref 4.6–6.5)

## 2022-09-16 LAB — VITAMIN B12: Vitamin B-12: 147 pg/mL — ABNORMAL LOW (ref 211–911)

## 2022-09-16 MED ORDER — PIOGLITAZONE HCL 15 MG PO TABS: 15.0000 mg | ORAL_TABLET | Freq: Every day | ORAL | 3 refills | Status: DC

## 2022-10-24 ENCOUNTER — Other Ambulatory Visit: Payer: Self-pay | Admitting: Internal Medicine

## 2022-10-26 ENCOUNTER — Other Ambulatory Visit: Payer: Self-pay

## 2022-11-04 ENCOUNTER — Ambulatory Visit: Payer: 59 | Admitting: Internal Medicine

## 2022-11-04 VITALS — BP 138/82 | HR 78 | Temp 98.1°F | Ht 71.0 in | Wt 183.0 lb

## 2022-11-04 DIAGNOSIS — L03114 Cellulitis of left upper limb: Secondary | ICD-10-CM | POA: Diagnosis not present

## 2022-11-04 DIAGNOSIS — E0865 Diabetes mellitus due to underlying condition with hyperglycemia: Secondary | ICD-10-CM | POA: Diagnosis not present

## 2022-11-04 DIAGNOSIS — R300 Dysuria: Secondary | ICD-10-CM | POA: Diagnosis not present

## 2022-11-04 DIAGNOSIS — E538 Deficiency of other specified B group vitamins: Secondary | ICD-10-CM | POA: Diagnosis not present

## 2022-11-04 MED ORDER — CYANOCOBALAMIN 1000 MCG/ML IJ SOLN
1000.0000 ug | Freq: Once | INTRAMUSCULAR | Status: AC
Start: 2022-11-04 — End: 2022-11-04
  Administered 2022-11-04: 1000 ug via INTRAMUSCULAR

## 2022-11-04 MED ORDER — DOXYCYCLINE HYCLATE 100 MG PO TABS
100.0000 mg | ORAL_TABLET | Freq: Two times a day (BID) | ORAL | 0 refills | Status: DC
Start: 2022-11-04 — End: 2023-01-27

## 2022-11-04 MED ORDER — REPAGLINIDE 0.5 MG PO TABS: 0.5000 mg | ORAL_TABLET | Freq: Three times a day (TID) | ORAL | 3 refills | Status: DC

## 2022-11-04 NOTE — Patient Instructions (Addendum)
You had the B12 shot today  Ok to take the b12 in gummies at home  - 1 per day  Please take all new medication as prescribed - the prandin 0.5 mg three times per day with meals for the sugar (so then not take the actos), and the doxycycline 100 mg twice per day for 10 days for the infection  Please have the Shingles and the Flu shots done at the pharmacy  Please continue all other medications as before, and refills have been done if requested.  Please have the pharmacy call with any other refills you may need.  Please continue your efforts at being more active, low cholesterol diet, and weight control.  Please keep your appointments with your specialists as you may have planned  Please go to the LAB at the blood drawing area for the tests to be done - just the urine testing today  Please make an Appointment to return in 3 months, or sooner if needed

## 2022-11-04 NOTE — Progress Notes (Unsigned)
Patient ID: Jacob [name], male   DOB: [birth year], 57 y.o.   MRN: [id number]        Chief Complaint: follow up with recent ear pain now resolved, but also here with recent low B12 asking for IM shot today, left arm cellulitis, dm, and mild dysuria       HPI:  LIBERATO [name] [name] is a 57 y.o. male here with c/o 3 days onset left red, tender swelling mid post distal arm after scraped at work.  Pt denies chest pain, increased sob or doe, wheezing, orthopnea, PND, increased LE swelling, palpitations, dizziness or syncope.   Pt denies polydipsia, polyuria, or new focal neuro s/s.   Denies urinary symptoms such as dysuria, frequency, urgency, flank pain, hematuria or n/v, fever, chills, except for mild dysuria x 2 days,  Pt did not like the side effect profile of actos, so did not take and wants to consider another direction.  Does c/o ongoing fatigue, but denies signficant daytime hypersomnolence.        Wt Readings from Last 3 Encounters:  11/05/22 173 lb (78.5 kg)  11/04/22 183 lb (83 kg)  09/01/22 182 lb (82.6 kg)   BP Readings from Last 3 Encounters:  11/05/22 130/82  11/04/22 138/82  09/01/22 (!) 148/92         Past Medical History:  Diagnosis Date   Allergic rhinitis    Anxiety    Arthritis    Cholesteatoma of right ear    Diabetes mellitus (HCC)    bordreline   Dysuria    Enlarged prostate    pt unaware   GERD (gastroesophageal reflux disease)    Headache    Hearing loss    Right ear   Hernia of abdominal wall    History of gross hematuria    History of head injury    History of kidney stones    History of palpitations    Hyperlipidemia 10/18/2014   Hypertension    Insomnia    Lack of concentration    Nasal fracture    Several   Otitis media 11/10/2017   Bilateral   Panic attacks    Syncope    Vertigo    Past Surgical History:  Procedure Laterality Date   CHOLECYSTECTOMY     CYSTOSCOPY N/A 05/10/2018   Procedure: CYSTOSCOPY WITH EXAM UNDER ANESTHESIA;  Surgeon:  Rene Paci, MD;  Location: [facility];  Service: Urology;  Laterality: N/A;  ONLY NEEDS 30 MIN   CYSTOSCOPY N/A 07/17/2022   Procedure: CYSTOSCOPY, BILATERAL RETROGRADE PYELOGRAM;  Surgeon: Crista Elliot, MD;  Location: [facility];  Service: Urology;  Laterality: N/A;   HYPOSPADIAS CORRECTION N/A 1978   INNER EAR SURGERY     right   LUMBAR DISC SURGERY     MASTOIDECTOMY Right    NOSE SURGERY     Fracture   tubes in bil ears     UPPER GASTROINTESTINAL ENDOSCOPY     UPPER GI ENDOSCOPY  01/09/2012   Esophageal dilation    reports that he has never smoked. He has never used smokeless tobacco. He reports current alcohol use of about 5.0 standard drinks of alcohol per week. He reports that he does not use drugs. family history includes ADD / ADHD in his son; Anxiety disorder in his cousin, maternal aunt, maternal grandmother, maternal uncle, and mother; COPD in his mother; Cancer in his father; Diabetes in his maternal grandmother and paternal grandmother; Drug  abuse in his son; Heart attack in his brother; Heart disease in his father; Hypertension in his mother; Lung cancer in his maternal uncle; OCD in his maternal aunt. No Known Allergies Current Outpatient Medications on File Prior to Visit  Medication Sig Dispense Refill   ALPRAZolam (XANAX) 0.5 MG tablet TAKE 1 TO 2 TABLETS BY MOUTH AT BEDTIME AS NEEDED 60 tablet 5   Ascorbic Acid (VITAMIN C PO) Take 500 mg by mouth daily.     aspirin EC 81 MG tablet Take 1 tablet (81 mg total) by mouth daily. 90 tablet 11   cyanocobalamin (VITAMIN B12) 1000 MCG tablet Take 1 tablet (1,000 mcg total) by mouth daily. 90 tablet 1   cyclobenzaprine (FLEXERIL) 5 MG tablet Take 1 tablet (5 mg total) by mouth at bedtime. 90 tablet 3   empagliflozin (JARDIANCE) 25 MG TABS tablet Take 1 tablet (25 mg total) by mouth daily before breakfast. 90 tablet 3   metFORMIN (GLUCOPHAGE-XR) 500 MG 24 hr tablet TAKE 4 TABLETS  BY MOUTH ONCE DAILY WITH BREAKFAST 360 tablet 3   Multiple Vitamin (MULTIVITAMIN WITH MINERALS) TABS tablet Take 1 tablet by mouth daily.     nitrofurantoin, macrocrystal-monohydrate, (MACROBID) 100 MG capsule Take 1 capsule (100 mg total) by mouth 2 (two) times daily. 14 capsule 0   Omega-3 Fatty Acids (FISH OIL PO) Take 1 capsule by mouth daily.     pantoprazole (PROTONIX) 40 MG tablet Take 1 tablet (40 mg total) by mouth 2 (two) times daily. 180 tablet 3   pioglitazone (ACTOS) 15 MG tablet Take 1 tablet (15 mg total) by mouth daily. 90 tablet 3   ramipril (ALTACE) 10 MG capsule Take 1 capsule by mouth once daily 90 capsule 3   rosuvastatin (CRESTOR) 10 MG tablet Take 1 tablet by mouth once daily 90 tablet 3   No current facility-administered medications on file prior to visit.        ROS:  All others reviewed and negative.  Objective        PE:  BP 138/82   Pulse 78   Temp 98.1 F (36.7 C) (Temporal)   Ht 5\' 11"  (1.803 m)   Wt 183 lb (83 kg)   SpO2 95%   BMI 25.52 kg/m                 Constitutional: Pt appears in NAD               HENT: Head: NCAT.                Right Ear: External ear normal.                 Left Ear: External ear normal.                Eyes: . Pupils are equal, round, and reactive to light. Conjunctivae and EOM are normal               Nose: without d/c or deformity               Neck: Neck supple. Gross normal ROM               Cardiovascular: Normal rate and regular rhythm.                 Pulmonary/Chest: Effort normal and breath sounds without rales or wheezing.                Abd:  Soft,  NT, ND, + BS, no organomegaly               Neurological: Pt is alert. At baseline orientation, motor grossly intact               Skin: Skin is warm. No rashes, no other new lesions, LE edema - none, left mid post arm with 3 cm area red, tender swelling               Psychiatric: Pt behavior is normal without agitation   Micro: none  Cardiac tracings I have  personally interpreted today:  none  Pertinent Radiological findings (summarize): none   Lab Results  Component Value Date   WBC 5.5 07/03/2022   HGB 16.7 07/17/2022   HCT 49.0 07/17/2022   PLT 259.0 07/03/2022   GLUCOSE 140 (H) 09/16/2022   CHOL 151 09/16/2022   TRIG 128.0 09/16/2022   HDL 54.60 09/16/2022   LDLDIRECT 102.0 11/23/2016   LDLCALC 70 09/16/2022   ALT 34 09/16/2022   AST 24 09/16/2022   NA 140 09/16/2022   K 4.8 09/16/2022   CL 102 09/16/2022   CREATININE 0.99 09/16/2022   BUN 13 09/16/2022   CO2 26 09/16/2022   TSH 1.69 07/03/2022   PSA 2.06 09/16/2022   INR 0.96 12/11/2017   HGBA1C 7.4 (H) 09/16/2022   MICROALBUR <0.7 09/16/2022   Assessment/Plan:  RAFE GOVONI [name] is a 57 y.o. White or Caucasian [1] male with  has a past medical history of Allergic rhinitis, Anxiety, Arthritis, Cholesteatoma of right ear, Diabetes mellitus (HCC), Dysuria, Enlarged prostate, GERD (gastroesophageal reflux disease), Headache, Hearing loss, Hernia of abdominal wall, History of gross hematuria, History of head injury, History of kidney stones, History of palpitations, Hyperlipidemia (10/18/2014), Hypertension, Insomnia, Lack of concentration, Nasal fracture, Otitis media (11/10/2017), Panic attacks, Syncope, and Vertigo.  B12 deficiency Lab Results  Component Value Date   VITAMINB12 147 (L) 09/16/2022   Low, to start oral replacement - b12 1000 mcg every day after b12 1000 mcg IM today   Left arm cellulitis Mild to mod, for antibx course - doxycycline 100 bid,  to f/u any worsening symptoms or concerns  Dysuria Exam benign, etiology unclear, for ua and cultrue  Diabetes mellitus due to underlying condition with hyperglycemia, without long-term current use of insulin (HCC) Lab Results  Component Value Date   HGBA1C 7.4 (H) 09/16/2022   uncontrolled, pt to continue current medical treatment jardiance 25 every day, metformin ER 500 mg- 4 every day, but change the actos he did  not want to start with prandin 0.5 mg tid   Followup: Return in about 3 months (around 02/04/2023).  Oliver Barre, MD 11/05/2022 12:57 PM [facility] - [facility] Medicine

## 2022-11-05 ENCOUNTER — Ambulatory Visit: Payer: 59 | Admitting: Family Medicine

## 2022-11-05 ENCOUNTER — Encounter: Payer: Self-pay | Admitting: Internal Medicine

## 2022-11-05 ENCOUNTER — Encounter: Payer: Self-pay | Admitting: Family Medicine

## 2022-11-05 ENCOUNTER — Telehealth: Payer: Self-pay | Admitting: Family Medicine

## 2022-11-05 VITALS — BP 130/82 | HR 68 | Ht 71.0 in | Wt 173.0 lb

## 2022-11-05 DIAGNOSIS — G8929 Other chronic pain: Secondary | ICD-10-CM

## 2022-11-05 DIAGNOSIS — M25511 Pain in right shoulder: Secondary | ICD-10-CM | POA: Diagnosis not present

## 2022-11-05 MED ORDER — TIZANIDINE HCL 4 MG PO TABS: 4.0000 mg | ORAL_TABLET | Freq: Three times a day (TID) | ORAL | 1 refills | Status: DC | PRN

## 2022-11-05 NOTE — Assessment & Plan Note (Signed)
Lab Results  Component Value Date   HGBA1C 7.4 (H) 09/16/2022   uncontrolled, pt to continue current medical treatment jardiance 25 every day, metformin ER 500 mg- 4 every day, but change the actos he did not want to start with prandin 0.5 mg tid

## 2022-11-05 NOTE — Assessment & Plan Note (Signed)
Exam benign, etiology unclear, for ua and cultrue

## 2022-11-05 NOTE — Telephone Encounter (Signed)
Once MRI done, pt would like to be referred for surgery to Dr. Lowry Ram @ [facility]. Pt lives in [place] and this provider was recommended to him.

## 2022-11-05 NOTE — Assessment & Plan Note (Signed)
Lab Results  Component Value Date   VITAMINB12 147 (L) 09/16/2022   Low, to start oral replacement - b12 1000 mcg every day after b12 1000 mcg IM today

## 2022-11-05 NOTE — Patient Instructions (Addendum)
Thank you for coming in today.   You should hear from MRI scheduling within 1 week. If you do not hear please let me know.    Once we get the report back I will get you right in with Dr August Saucer.   Let me know.   Use the tizinadine at bedtime in addition to tylenol and ibuprofen.

## 2022-11-05 NOTE — Assessment & Plan Note (Signed)
Mild to mod, for antibx course doxycycline 100 bid,  to f/u any worsening symptoms or concerns 

## 2022-11-05 NOTE — Progress Notes (Signed)
   I, Stevenson Clinch, CMA acting as a scribe for Clementeen Graham, MD.  Jacob [name] is a 57 y.o. male who presents to [facility] at [facility] today for cont'd R shoulder pain. Pt was last seen by Dr. Denyse Amass on 09/01/22 and was given a R subacromial steroid injection and was advised to re-consider orthopedic surgery.  Today, pt reports worsening right shoulder pain over the past 3 weeks. Having pain even at rest. Difficulty lifting things while at work, especially overhead. Occasional  n/t in the fingers. Occasional spasms in the shoulders. Considering surgery but would like to discuss symptoms management until then.   Dx imaging: 11/17/21 R shoulder MRI              11/10/18 R shoulder XR  Pertinent review of systems: No fevers or chills  Relevant historical information: Rotator cuff tear right shoulder MRI last year.   Exam:  BP 130/82   Pulse 68   Ht 5\' 11"  (1.803 m)   Wt 173 lb (78.5 kg)   SpO2 97%   BMI 24.13 kg/m  General: Well Developed, well nourished, and in no acute distress.   MSK: Right shoulder normal-appearing Range of motion intact pain with abduction.  Strength intact pain with abduction.  Positive Hawkins and Neer's test.    Assessment and Plan: 57 y.o. male with chronic right shoulder pain.  Pain has been off and on for over a year now.  He had an MRI about a year ago which did show a rotator cuff tear.  Unfortunately he was not able to have surgery at that time due to a job change.  He has been in his current job long enough now that he could have surgery if needed.  His shoulder pain is worsening.  Last steroid injection was about 2 months ago.  I discussed the case with his orthopedic surgeon.  Will get a new MRI and then refer back to orthopedics.  Would be helpful for determining extent of worsening tear and for muscle atrophy and for surgical planning.  Talked about some medications for pain management.  Will use tizanidine primarily at bedtime as  needed.   PDMP not reviewed this encounter. Orders Placed This Encounter  Procedures   MR SHOULDER RIGHT WO CONTRAST    Standing Status:   Future    Standing Expiration Date:   11/05/2023    Order Specific Question:   What is the patient's sedation requirement?    Answer:   No Sedation    Order Specific Question:   Does the patient have a pacemaker or implanted devices?    Answer:   No    Order Specific Question:   Preferred imaging location?    Answer:   [facility] (table limit-350lbs)   Meds ordered this encounter  Medications   tiZANidine (ZANAFLEX) 4 MG tablet    Sig: Take 1 tablet (4 mg total) by mouth every 8 (eight) hours as needed.    Dispense:  30 tablet    Refill:  1     Discussed warning signs or symptoms. Please see discharge instructions. Patient expresses understanding.   The above documentation has been reviewed and is accurate and complete Clementeen Graham, M.D.

## 2022-11-09 NOTE — Telephone Encounter (Signed)
MRI has not yet been scheduled.

## 2022-11-10 ENCOUNTER — Ambulatory Visit: Payer: 59

## 2022-11-10 DIAGNOSIS — G8929 Other chronic pain: Secondary | ICD-10-CM

## 2022-11-10 DIAGNOSIS — M25511 Pain in right shoulder: Secondary | ICD-10-CM | POA: Diagnosis not present

## 2022-11-10 NOTE — Telephone Encounter (Signed)
MRI scheduled for today.   Referral placed to Dr. Lowry Ram @ [facility] .

## 2022-11-10 NOTE — Addendum Note (Signed)
Addended by: Dierdre Searles on: 11/10/2022 09:38 AM   Modules accepted: Orders

## 2022-11-17 ENCOUNTER — Telehealth: Payer: Self-pay

## 2022-11-17 ENCOUNTER — Other Ambulatory Visit: Payer: Self-pay

## 2022-11-17 DIAGNOSIS — G8929 Other chronic pain: Secondary | ICD-10-CM

## 2022-11-17 MED ORDER — HYDROCODONE-ACETAMINOPHEN 5-325 MG PO TABS: 1.0000 | ORAL_TABLET | Freq: Four times a day (QID) | ORAL | 0 refills | Status: DC | PRN

## 2022-11-17 MED ORDER — MELOXICAM 15 MG PO TABS: ORAL_TABLET | ORAL | 3 refills | Status: DC

## 2022-11-17 NOTE — Addendum Note (Signed)
Addended by: Rodolph Bong on: 11/17/2022 08:37 AM   Modules accepted: Orders

## 2022-11-17 NOTE — Telephone Encounter (Signed)
Pt called and was provided his MRI results. He was not able to be seen by Dr. Rande Brunt until Sept 10th. New referral placed to Dr. August Saucer.   Pt noted his shoulder is really killing him. He was wondering if there was anything he could do or be prescribed. He noted that he had taken Vicodin in the past and was able to still work while taking this rx.   Please advise.

## 2022-11-17 NOTE — Telephone Encounter (Signed)
I called Jacob [name].  I did prescribe meloxicam and limited hydrocodone.  He is scheduled to see Dr. August Saucer on September 4 in the [facility] orthopedic surgeon the week after.  I encouraged him to see both surgeons to get a consensus opinion about what to do about his shoulder.  Medicines will be helpful for a little while but are not a long-term plan.

## 2022-11-17 NOTE — Progress Notes (Signed)
MRI multiple rotator cuff tears.  There are potentially several surgical options.  I know we put a referral in to a [facility] orthopedic surgeon he requested about 2 weeks ago.  Is that scheduled anytime soon?  You should definitely follow-up with the surgeon.  Let me know if you would like a different referral.

## 2022-12-02 ENCOUNTER — Ambulatory Visit: Payer: 59 | Admitting: Orthopedic Surgery

## 2022-12-22 ENCOUNTER — Other Ambulatory Visit: Payer: Self-pay | Admitting: Internal Medicine

## 2023-01-18 ENCOUNTER — Other Ambulatory Visit: Payer: Self-pay | Admitting: Internal Medicine

## 2023-01-19 ENCOUNTER — Other Ambulatory Visit: Payer: Self-pay

## 2023-01-27 ENCOUNTER — Ambulatory Visit (INDEPENDENT_AMBULATORY_CARE_PROVIDER_SITE_OTHER): Payer: 59 | Admitting: Nurse Practitioner

## 2023-01-27 VITALS — BP 136/84 | HR 77 | Temp 98.0°F | Ht 71.0 in | Wt 174.0 lb

## 2023-01-27 DIAGNOSIS — Z23 Encounter for immunization: Secondary | ICD-10-CM | POA: Diagnosis not present

## 2023-01-27 DIAGNOSIS — R079 Chest pain, unspecified: Secondary | ICD-10-CM | POA: Diagnosis not present

## 2023-01-27 DIAGNOSIS — E1165 Type 2 diabetes mellitus with hyperglycemia: Secondary | ICD-10-CM

## 2023-01-27 DIAGNOSIS — E0865 Diabetes mellitus due to underlying condition with hyperglycemia: Secondary | ICD-10-CM

## 2023-01-27 DIAGNOSIS — R300 Dysuria: Secondary | ICD-10-CM

## 2023-01-27 LAB — POCT GLYCOSYLATED HEMOGLOBIN (HGB A1C): Hemoglobin A1C: 6.4 % — AB (ref 4.0–5.6)

## 2023-01-27 LAB — URINALYSIS WITH CULTURE, IF INDICATED
Bilirubin Urine: NEGATIVE
Hgb urine dipstick: NEGATIVE
Ketones, ur: NEGATIVE
Leukocytes,Ua: NEGATIVE
Nitrite: NEGATIVE
Specific Gravity, Urine: 1.01 (ref 1.000–1.030)
Total Protein, Urine: NEGATIVE
Urine Glucose: >=1000 — AB
Urobilinogen, UA: 0.2 (ref 0.0–1.0)
pH: 6 (ref 5.0–8.0)

## 2023-01-27 LAB — POC URINALSYSI DIPSTICK (AUTOMATED)
Bilirubin, UA: NEGATIVE
Blood, UA: NEGATIVE
Glucose, UA: POSITIVE — AB
Ketones, UA: NEGATIVE
Leukocytes, UA: NEGATIVE
Nitrite, UA: NEGATIVE
Protein, UA: NEGATIVE
Spec Grav, UA: 1.015 (ref 1.010–1.025)
Urobilinogen, UA: 0.2 U/dL
pH, UA: 6 (ref 5.0–8.0)

## 2023-01-27 NOTE — Progress Notes (Addendum)
Established Patient Office Visit  Subjective   Patient ID: Jacob [name], male    DOB: 24-Dec-1965  Age: 56 y.o. MRN: [id number]  Chief Complaint  Patient presents with   Urinary Tract Infection    Burning sensation, want b12, flu shot and A1C    Dysuria: Symptom onset a few days ago.  Reports having had UTIs since starting Jardiance.  Denies hematuria, nausea, vomiting, fever, does report dysuria, no rectal pain, no testicular pain.  Denies any new sexual partners or known exposure to STDs.  Declines STD testing today.  Chest Pain: Acute, occurred upon waking up this morning.  Reports that it lasted for a few minutes and then resolved on its own.  No associated nausea, vomiting, fatigue, shortness of breath, radiation to jaw or arm.  Reports that he recently had rotator cuff surgery which has resulted in him having to sleep in different positions.  After getting up and walking around for a bit pain subsided.  Does have family history of MI (brother) as well as history of hypertension he underwent CT cardiac scoring in 06/2021 which identified coronary calcium score of 74 which at that time was in the 78th percentile. Underwent cardiac stress test in 12/2021 which was normal (no evidence of ischemia found).  He is on rosuvastatin with last LDL 70.  He reports he is a never smoker. He does have a history of T2DM. Would like A1C checked today. Last level was 7.4 in 08/2022.     Review of Systems  Respiratory:  Negative for shortness of breath.   Cardiovascular:  Negative for chest pain (had an episode of pain this morning).  Gastrointestinal:  Negative for nausea and vomiting.  Genitourinary:  Positive for dysuria and frequency. Negative for hematuria.      Objective:     BP 136/84   Pulse 77   Temp 98 F (36.7 C) (Temporal)   Ht 5\' 11"  (1.803 m)   Wt 174 lb (78.9 kg)   SpO2 97%   BMI 24.27 kg/m    Physical Exam Vitals reviewed.  Constitutional:      Appearance: Normal  appearance.  HENT:     Head: Normocephalic and atraumatic.  Cardiovascular:     Rate and Rhythm: Normal rate and regular rhythm.  Pulmonary:     Effort: Pulmonary effort is normal.     Breath sounds: Normal breath sounds.  Abdominal:     Tenderness: There is no right CVA tenderness or left CVA tenderness.  Musculoskeletal:     Cervical back: Neck supple.  Skin:    General: Skin is warm and dry.  Neurological:     Mental Status: He is alert and oriented to person, place, and time.  Psychiatric:        Mood and Affect: Mood normal.        Behavior: Behavior normal.        Thought Content: Thought content normal.        Judgment: Judgment normal.    EKG: NSR  Results for orders placed or performed in visit on 01/27/23  POCT HgB A1C  Result Value Ref Range   Hemoglobin A1C 6.4 (A) 4.0 - 5.6 %   HbA1c POC (<> result, manual entry)     HbA1c, POC (prediabetic range)     HbA1c, POC (controlled diabetic range)    POCT Urinalysis Dipstick (Automated)  Result Value Ref Range   Color, UA     Clarity, UA  Glucose, UA Positive (A) Negative   Bilirubin, UA negative    Ketones, UA negative    Spec Grav, UA 1.015 1.010 - 1.025   Blood, UA negative    pH, UA 6.0 5.0 - 8.0   Protein, UA Negative Negative   Urobilinogen, UA 0.2 0.2 or 1.0 E.U./dL   Nitrite, UA negative    Leukocytes, UA Negative Negative      The 10-year ASCVD risk score (Arnett DK, et al., 2019) is: 9.2%    Assessment & Plan:   Problem List Items Addressed This Visit       Endocrine   Diabetes mellitus due to underlying condition with hyperglycemia, without long-term current use of insulin (HCC)   Relevant Orders   POCT HgB A1C (Completed)     Other   Chest pain - Primary    Acute  EKG: NSR We discussed the difference between NSTEMI and STEMI and that this EKG is reassuring but does not rule out an MI so the safest option is evaluation in the emergency department and that if he has any reoccurrence  of chest pain I recommend immediate evaluation in the emergency department. He reports his understanding. Due to his risk factors we will refer him to cardiology as well for assistance with management. Patient to continue on his ramipril, aspirin, and statin.       Relevant Orders   EKG 12-Lead   Ambulatory referral to Cardiology   Dysuria    Acute POC U/A negative for signs of UTI Send urine for culture, further recommendations may be made based on these results.  Offered STD testing but patient declines.       Relevant Orders   Urinalysis with Culture, if indicated   Urine Culture   POCT Urinalysis Dipstick (Automated) (Completed)   Needs flu shot    Flu shot administered, VIS provided.       Other Visit Diagnoses     Flu vaccine need       Relevant Orders   Flu vaccine trivalent PF, 6mos and older(Flulaval,Afluria,Fluarix,Fluzone) (Completed)       Return in about 3 months (around 04/29/2023) for F/U with Dr. Jonny Ruiz.    Elenore Paddy, NP

## 2023-01-27 NOTE — Assessment & Plan Note (Signed)
Flu shot administered, VIS provided. 

## 2023-01-27 NOTE — Addendum Note (Signed)
Addended by: Cathleen Fears, Mark Benecke P on: 01/27/2023 01:15 PM   Modules accepted: Orders

## 2023-01-27 NOTE — Assessment & Plan Note (Addendum)
Acute  EKG: NSR We discussed the difference between NSTEMI and STEMI and that this EKG is reassuring but does not rule out an MI so the safest option is evaluation in the emergency department and that if he has any reoccurrence of chest pain I recommend immediate evaluation in the emergency department. He reports his understanding. Due to his risk factors we will refer him to cardiology as well for assistance with management. Patient to continue on his ramipril, aspirin, and statin.

## 2023-01-27 NOTE — Assessment & Plan Note (Signed)
Acute POC U/A negative for signs of UTI Send urine for culture, further recommendations may be made based on these results.  Offered STD testing but patient declines.

## 2023-01-28 LAB — URINE CULTURE: Result:: NO GROWTH

## 2023-02-08 ENCOUNTER — Ambulatory Visit: Payer: 59 | Attending: Physician Assistant | Admitting: Physician Assistant

## 2023-02-08 ENCOUNTER — Encounter: Payer: Self-pay | Admitting: Physician Assistant

## 2023-02-08 VITALS — BP 134/76 | HR 89 | Ht 71.0 in | Wt 183.4 lb

## 2023-02-08 DIAGNOSIS — R079 Chest pain, unspecified: Secondary | ICD-10-CM | POA: Diagnosis not present

## 2023-02-08 DIAGNOSIS — Z79899 Other long term (current) drug therapy: Secondary | ICD-10-CM | POA: Diagnosis not present

## 2023-02-08 MED ORDER — METOPROLOL TARTRATE 100 MG PO TABS
100.0000 mg | ORAL_TABLET | Freq: Once | ORAL | 0 refills | Status: DC
Start: 2023-02-08 — End: 2023-07-02

## 2023-02-08 NOTE — Progress Notes (Signed)
Cardiology Office Note:  .   Date:  02/08/2023  ID:  Jacob [name], DOB 1965/11/04, MRN [id number] PCP: Corwin Levins, MD  [facility] Providers Cardiologist:  Maisie Fus, MD     History of Present Illness: .   Jacob [name] is a 57 y.o. male with PMH of HTN, anxiety, GERD and elevated coronary calcium score.  Echocardiogram in July 2017 was normal.  Patient had a Myoview in 2018 that was normal.  His brother had 2 MIs.  Calcium scoring test obtained on 07/21/2021 demonstrated coronary calcium score of 74 which placed the patient at 78th percentile for age and sex matched control, chronic calcium fairly evenly distributed in all 3 major coronary vessels.  Treadmill Myoview obtained on 01/06/2022 was low risk, patient was able to exercise for 9 minutes and 0 seconds, he achieved 10.1 METS of activity.  He did not experience any angina during that treadmill test.  Perfusion imaging was normal with no evidence of infarction or ischemia.  He was recently seen by PCP on 01/27/2023 at which time he describes some chest discomfort.  He was referred to cardiology service for further evaluation.  Patient presents today for follow-up, at which time he still have occasional chest discomfort about 1-2 times per week.  This typically last 5 to 10 minutes each.  Symptom does not have direct association with the degree of physical exertion.  He has never smoked in the past, unlike his brother.  He is very anxious.  He underwent right shoulder repair in September and is still have a lot of pain in the right shoulder area.  We discussed various options, I think he will be a good candidate for coronary CT.  If coronary CT is negative, then no further workup will be needed.  He can follow-up in 6 weeks.  ROS:   Patient complains of intermittent chest pain occurring 1-2 times per week.  He denies any significant shortness of breath, lower extremity edema, orthopnea or PND  Studies Reviewed: .         Cardiac Studies & Procedures     STRESS TESTS  MYOCARDIAL PERFUSION IMAGING 01/06/2022  Narrative   The study is normal. The study is low risk.   Exercise capacity was normal. Patient exercised for 9 min and 0 sec. Maximum HR of 150 bpm. MPHR 90.0 %. Peak METS 10.1 . The patient experienced no angina during the test. Normal blood pressure and normal heart rate response noted during stress. Heart rate recovery was normal.   No ST deviation was noted.   LV perfusion is normal. There is no evidence of ischemia. There is no evidence of infarction.   Left ventricular function is normal. End diastolic cavity size is normal. End systolic cavity size is normal.   Prior study available for comparison from 07/02/2016.   ECHOCARDIOGRAM  ECHOCARDIOGRAM COMPLETE 09/28/2015  Narrative *[facility]* *[facility]* [address] [place], [place] [address] [phone]  ------------------------------------------------------------------- Transthoracic Echocardiography  [name], Jacob [name] MR #:       [id number] Study Date: 09/28/2015 Gender:     M Age:        49 Height:     180.3 cm Weight:     87.9 kg BSA:        2.11 m^2 Pt. Status: Room:       5C15C  SONOGRAPHER  Sherren Kerns, RVS ORDERING     Marcellus Scott  Weston Brass [id number] SONOGRAPHER  Delcie Roch, RDCS, CCT PERFORMING   [facility], Inpatient  cc:  ------------------------------------------------------------------- LV EF: 55% -   60%  ------------------------------------------------------------------- Indications:      Syncope 780.2.  ------------------------------------------------------------------- History:   Risk factors:  Hypertension. Diabetes mellitus. Dyslipidemia.  ------------------------------------------------------------------- Study Conclusions  - Left ventricle: The cavity size was normal. Systolic function was normal. The estimated ejection fraction was in the range  of 55% to 60%. Wall motion was normal; there were no regional wall motion abnormalities.  Transthoracic echocardiography.  M-mode, complete 2D, spectral Doppler, and color Doppler.  Birthdate:  Patient birthdate: [birth year].  Age:  Patient is 57 yr old.  Sex:  Gender: male. BMI: 27 kg/m^2.  Blood pressure:     98/64  Patient status: Inpatient.  Study date:  Study date: 09/28/2015. Study time: 07:57 AM.  Location:  Echo laboratory.  -------------------------------------------------------------------  ------------------------------------------------------------------- Left ventricle:  The cavity size was normal. Systolic function was normal. The estimated ejection fraction was in the range of 55% to 60%. Wall motion was normal; there were no regional wall motion abnormalities.  ------------------------------------------------------------------- Aortic valve:   Trileaflet; normal thickness leaflets. Mobility was not restricted.  Doppler:  Transvalvular velocity was within the normal range. There was no stenosis. There was no regurgitation.  ------------------------------------------------------------------- Aorta:  Aortic root: The aortic root was normal in size.  ------------------------------------------------------------------- Mitral valve:   Structurally normal valve.   Mobility was not restricted.  Doppler:  Transvalvular velocity was within the normal range. There was no evidence for stenosis. There was trivial regurgitation.    Peak gradient (D): 3 mm Hg.  ------------------------------------------------------------------- Left atrium:  The atrium was normal in size.  ------------------------------------------------------------------- Right ventricle:  The cavity size was normal. Wall thickness was normal. Systolic function was normal.  ------------------------------------------------------------------- Pulmonic valve:   Not well visualized.  The valve appears to  be grossly normal.    Doppler:  Transvalvular velocity was within the normal range. There was no evidence for stenosis.  ------------------------------------------------------------------- Tricuspid valve:   Structurally normal valve.    Doppler: Transvalvular velocity was within the normal range. There was no regurgitation.  ------------------------------------------------------------------- Pulmonary artery:   The main pulmonary artery was normal-sized.  ------------------------------------------------------------------- Right atrium:  The atrium was normal in size.  ------------------------------------------------------------------- Pericardium:  There was no pericardial effusion.  ------------------------------------------------------------------- Systemic veins: Inferior vena cava: The vessel was normal in size.  ------------------------------------------------------------------- Measurements  Left ventricle                         Value        Reference LV ID, ED, PLAX chordal                45.3  mm     43 - 52 LV ID, ES, PLAX chordal                30.4  mm     23 - 38 LV fx shortening, PLAX chordal         33    %      >=29 LV PW thickness, ED                    8.83  mm     --------- IVS/LV PW ratio, ED                    1            <=  1.3 LV e&', lateral                         11.1  cm/s   --------- LV E/e&', lateral                       8.34         --------- LV e&', medial                          9.03  cm/s   --------- LV E/e&', medial                        10.25        --------- LV e&', average                         10.07 cm/s   --------- LV E/e&', average                       9.2          ---------  Ventricular septum                     Value        Reference IVS thickness, ED                      8.83  mm     ---------  LVOT                                   Value        Reference LVOT ID, S                             22    mm     --------- LVOT  area                              3.8   cm^2   ---------  Aorta                                  Value        Reference Aortic root ID, ED                     29    mm     ---------  Left atrium                            Value        Reference LA ID, A-P, ES                         26    mm     --------- LA ID/bsa, A-P                         1.23  cm/m^2 <=2.2 LA volume, S  48.8  ml     --------- LA volume/bsa, S                       23.1  ml/m^2 --------- LA volume, ES, 1-p A4C                 44    ml     --------- LA volume/bsa, ES, 1-p A4C             20.8  ml/m^2 --------- LA volume, ES, 1-p A2C                 47.4  ml     --------- LA volume/bsa, ES, 1-p A2C             22.4  ml/m^2 ---------  Mitral valve                           Value        Reference Mitral E-wave peak velocity            92.6  cm/s   --------- Mitral A-wave peak velocity            52.4  cm/s   --------- Mitral deceleration time       (H)     301   ms     150 - 230 Mitral peak gradient, D                3     mm Hg  --------- Mitral E/A ratio, peak                 1.8          ---------  Systemic veins                         Value        Reference Estimated CVP                          3     mm Hg  ---------  Right ventricle                        Value        Reference TAPSE                                  19.5  mm     --------- RV s&', lateral, S                      11.6  cm/s   ---------  Legend: (L)  and  (H)  mark values outside specified reference range.  ------------------------------------------------------------------- Prepared and Electronically Authenticated by  Georga Hacking, MD [facility] 2017-07-01T12:39:18     CT SCANS  CT CARDIAC SCORING (SELF PAY ONLY) 07/21/2021  Addendum 07/21/2021 11:39 PM ADDENDUM REPORT: 07/21/2021 23:37  CLINICAL DATA:  Cardiovascular Disease Risk stratification  EXAM: Coronary Calcium Score  TECHNIQUE: A gated,  non-contrast computed tomography scan of the heart was performed using 3mm slice thickness. Axial images were analyzed on a dedicated workstation. Calcium scoring of the coronary arteries was performed using the Agatston method.  FINDINGS: Coronary arteries: Normal origins.  Coronary Calcium Score:  Left main:  Left  anterior descending artery: 29  Left circumflex artery: 18  Right coronary artery: 27  Total: 74  Percentile: 78  Pericardium: Normal.  Ascending Aorta: Normal caliber.  Non-cardiac: See separate report from [facility].  IMPRESSION: Coronary calcium score of 74. This was 78th percentile for age-, race-, and sex-matched controls.  RECOMMENDATIONS: Coronary artery calcium (CAC) score is a strong predictor of incident coronary heart disease (CHD) and provides predictive information beyond traditional risk factors. CAC scoring is reasonable to use in the decision to withhold, postpone, or initiate statin therapy in intermediate-risk or selected borderline-risk asymptomatic adults (age 47-75 years and LDL-C >=70 to <190 mg/dL) who do not have diabetes or established atherosclerotic cardiovascular disease (ASCVD).* In intermediate-risk (10-year ASCVD risk >=7.5% to <20%) adults or selected borderline-risk (10-year ASCVD risk >=5% to <7.5%) adults in whom a CAC score is measured for the purpose of making a treatment decision the following recommendations have been made:  If CAC=0, it is reasonable to withhold statin therapy and reassess in 5 to 10 years, as long as higher risk conditions are absent (diabetes mellitus, family history of premature CHD in first degree relatives (males <55 years; females <65 years), cigarette smoking, or LDL >=190 mg/dL).  If CAC is 1 to 99, it is reasonable to initiate statin therapy for patients >=16 years of age.  If CAC is >=100 or >=75th percentile, it is reasonable to initiate statin therapy at any  age.  Cardiology referral should be considered for patients with CAC scores >=400 or >=75th percentile.  *2018 AHA/ACC/AACVPR/AAPA/ABC/ACPM/ADA/AGS/APhA/ASPC/NLA/PCNA Guideline on the Management of Blood Cholesterol: A Report of the American College of Cardiology/American Heart Association Task Force on Clinical Practice Guidelines. J Am Coll Cardiol. 2019;73(24):3168-3209.  Epifanio Lesches, MD   Electronically Signed By: Epifanio Lesches M.D. On: 07/21/2021 23:37  Narrative EXAM: OVER-READ INTERPRETATION  CT CHEST  The following report is an over-read performed by radiologist Dr. Genevive Bi of [facility] on 07/21/2021. This over-read does not include interpretation of cardiac or coronary anatomy or pathology. The calcium score interpretation by the cardiologist is attached.  COMPARISON:  None.  FINDINGS: Limited view of the lung parenchyma demonstrates no suspicious nodularity. Airways are normal.  Limited view of the mediastinum demonstrates no adenopathy. Esophagus normal.  Limited view of the upper abdomen unremarkable.  Limited view of the skeleton and chest wall is unremarkable.  IMPRESSION: No significant extracardiac findings.  Electronically Signed: By: Genevive Bi M.D. On: 07/21/2021 11:27          Risk Assessment/Calculations:            Physical Exam:   VS:  BP 134/76 (BP Location: Left Arm, Patient Position: Sitting, Cuff Size: Normal)   Pulse 89   Ht 5\' 11"  (1.803 m)   Wt 183 lb 6.4 oz (83.2 kg)   SpO2 98%   BMI 25.58 kg/m    Wt Readings from Last 3 Encounters:  02/08/23 183 lb 6.4 oz (83.2 kg)  01/27/23 174 lb (78.9 kg)  11/05/22 173 lb (78.5 kg)    GEN: Well nourished, well developed in no acute distress NECK: No JVD; No carotid bruits CARDIAC: RRR, no murmurs, rubs, gallops RESPIRATORY:  Clear to auscultation without rales, wheezing or rhonchi  ABDOMEN: Soft, non-tender,  non-distended EXTREMITIES:  No edema; No deformity   ASSESSMENT AND PLAN: .    Chest Pain Chest pain occurring about once or twice a week, not directly related to physical activity. Episodes last about 5-10 minutes  and are often relieved by passing gas. No associated symptoms of palpitations. Family history of heart disease. Previous stress test in October 2023 was normal. Coronary calcium score in April 2023 was 74, placing the patient at the 78th percentile for age and sex-matched controls. -Order coronary CT to further evaluate the cause of chest pain. -Check kidney function prior to CT to ensure safe use of contrast. -Hold metformin 24 hours prior to and 48 hours after the coronary CT due to potential interaction with contrast dye. -Hold ramipril the night before the coronary CT. -Administer a single 100mg  dose of metoprolol two hours prior to the coronary CT to slow heart rate and improve image quality.         Dispo: Follow-up in 6 weeks  Signed, Azalee Course, PA

## 2023-02-08 NOTE — Patient Instructions (Addendum)
Medication Instructions:  TAKE ONE TIME DOSE OF METOPROLOL TARTRATE 100 MG 2 HOURS PRIOR TO TEST.  HOLD METFORMIN 24 HOURS PRIOR AN 48 HOURS AFTER TEST.  HOLD RAMIPRIL NIGHT BEFORE TEST.  *If you need a refill on your cardiac medications before your next appointment, please call your pharmacy*   Lab Work: BMP TODAY If you have labs (blood work) drawn today and your tests are completely normal, you will receive your results only by: MyChart Message (if you have MyChart) OR A paper copy in the mail If you have any lab test that is abnormal or we need to change your treatment, we will call you to review the results.   Testing/Procedures: Your physician has requested that you have cardiac CT. Cardiac computed tomography (CT) is a painless test that uses an x-ray machine to take clear, detailed pictures of your heart. For further information please visit [email or web address] Please follow instruction given below.     Follow-Up: At [facility] you and your health needs are our priority.  As part of our continuing mission to provide you with exceptional heart care, we have created designated Provider Care Teams.  These Care Teams include your primary Cardiologist (physician) and Advanced Practice Providers (APPs -  Physician Assistants and Nurse Practitioners) who all work together to provide you with the care you need, when you need it.  Your next appointment:   6 week(s)  Provider:   Azalee Course, PA    Other Instructions   Your cardiac CT will be scheduled at one of the below locations:   [facility] [address] [place], [place] [address] [phone]  If scheduled at [facility], please arrive at the [facility] and Children's Entrance (Entrance C2) of [facility] 30 minutes prior to test start time. You can use the FREE valet parking offered at entrance C (encouraged to control the heart rate for the test)  Proceed to the [facility]  Radiology Department (first floor) to check-in and test prep.  All radiology patients and guests should use entrance C2 at [facility], accessed from [facility], even though the hospital's physical address listed is [address].     Please follow these instructions carefully (unless otherwise directed):  An IV will be required for this test and Nitroglycerin will be given.  Hold all erectile dysfunction medications at least 3 days (72 hrs) prior to test. (Ie viagra, cialis, sildenafil, tadalafil, etc)   On the Night Before the Test: Be sure to Drink plenty of water. Do not consume any caffeinated/decaffeinated beverages or chocolate 12 hours prior to your test. Do not take any antihistamines 12 hours prior to your test.  On the Day of the Test: Drink plenty of water until 1 hour prior to the test. Do not eat any food 1 hour prior to test. You may take your regular medications prior to the test.  Take metoprolol (Lopressor) 100 mg one time dose two hours prior to test.        After the Test: Drink plenty of water. After receiving IV contrast, you may experience a mild flushed feeling. This is normal. On occasion, you may experience a mild rash up to 24 hours after the test. This is not dangerous. If this occurs, you can take Benadryl 25 mg and increase your fluid intake. If you experience trouble breathing, this can be serious. If it is severe call 911 IMMEDIATELY. If it is mild, please call  our office. If you take any of these medications: Glipizide/Metformin, Avandament, Glucavance, please do not take 48 hours after completing test unless otherwise instructed.  We will call to schedule your test 2-4 weeks out understanding that some insurance companies will need an authorization prior to the service being performed.   For more information and frequently asked questions, please visit our website : [email or web address]  For non-scheduling  related questions, please contact the cardiac imaging nurse navigator should you have any questions/concerns: Cardiac Imaging Nurse Navigators Direct Office Dial: [phone]   For scheduling needs, including cancellations and rescheduling, please call [place], [phone].

## 2023-02-09 LAB — BASIC METABOLIC PANEL
BUN/Creatinine Ratio: 18 (ref 9–20)
BUN: 18 mg/dL (ref 6–24)
CO2: 21 mmol/L (ref 20–29)
Calcium: 9.9 mg/dL (ref 8.7–10.2)
Chloride: 100 mmol/L (ref 96–106)
Creatinine, Ser: 1 mg/dL (ref 0.76–1.27)
Glucose: 218 mg/dL — ABNORMAL HIGH (ref 70–99)
Potassium: 4.8 mmol/L (ref 3.5–5.2)
Sodium: 137 mmol/L (ref 134–144)
eGFR: 89 mL/min/{1.73_m2} (ref >59)

## 2023-02-10 ENCOUNTER — Encounter: Payer: Self-pay | Admitting: Psychiatry

## 2023-02-12 ENCOUNTER — Telehealth (HOSPITAL_COMMUNITY): Payer: Self-pay | Admitting: *Deleted

## 2023-02-12 NOTE — Telephone Encounter (Signed)
Reaching out to patient to offer assistance regarding upcoming cardiac imaging study; pt verbalizes understanding of appt date/time, parking situation and where to check in, pre-test NPO status and medications ordered, and verified current allergies; name and call back number provided for further questions should they arise  Klee Kolek RN Navigator Cardiac Imaging [facility] [phone] office [phone] cell  Patient to take 100mg metoprolol tartrate two hours prior to his cardiac CT scan. 

## 2023-02-12 NOTE — Telephone Encounter (Signed)
Attempted to call patient regarding upcoming cardiac CT appointment. Left message on voicemail with name and callback number Hayley Sharpe RN Navigator Cardiac Imaging [facility] [phone] Office   

## 2023-02-15 ENCOUNTER — Ambulatory Visit
Admission: RE | Admit: 2023-02-15 | Discharge: 2023-02-15 | Disposition: A | Discharge status: Home / Self Care | Payer: 59 | Source: Ambulatory Visit | Attending: Physician Assistant | Admitting: Physician Assistant

## 2023-02-15 DIAGNOSIS — R079 Chest pain, unspecified: Secondary | ICD-10-CM | POA: Diagnosis present

## 2023-02-15 MED ORDER — NITROGLYCERIN 0.4 MG SL SUBL
0.8000 mg | SUBLINGUAL_TABLET | Freq: Once | SUBLINGUAL | Status: AC
Start: 2023-02-15 — End: 2023-02-15
  Administered 2023-02-15: 0.8 mg via SUBLINGUAL
  Filled 2023-02-15: qty 25

## 2023-02-15 MED ORDER — DILTIAZEM HCL 25 MG/5ML IV SOLN
10.0000 mg | INTRAVENOUS | Status: DC | PRN
  Filled 2023-02-15: qty 5

## 2023-02-15 MED ORDER — IOHEXOL 350 MG/ML SOLN
80.0000 mL | Freq: Once | INTRAVENOUS | Status: AC | PRN
Start: 2023-02-15 — End: 2023-02-15
  Administered 2023-02-15: 80 mL via INTRAVENOUS

## 2023-02-15 MED ORDER — METOPROLOL TARTRATE 5 MG/5ML IV SOLN
10.0000 mg | Freq: Once | INTRAVENOUS | Status: DC | PRN
  Filled 2023-02-15: qty 10

## 2023-02-15 NOTE — Progress Notes (Signed)
Patient tolerated procedure well. Ambulate w/o difficulty. Denies any lightheadedness or being dizzy. Pt denies any pain at this time. Sitting in chair. Pt is encouraged to drink additional water throughout the day and reason explained to patient. Patient verbalized understanding and all questions answered. ABC intact. No further needs at this time. Discharge from procedure area w/o issues. 

## 2023-02-16 ENCOUNTER — Ambulatory Visit: Payer: 59 | Admitting: Family Medicine

## 2023-02-16 NOTE — Progress Notes (Deleted)
   Rubin Payor, PhD, LAT, ATC acting as a scribe for Clementeen Graham, MD.  Jacob [name] is a 57 y.o. male who presents to [facility] at [facility] today for R hand pain. Pt was previously seen by Dr. Denyse Amass on 11/05/22 for R shoulder pain.   Today, pt c/o R hand pain x ***. Pt locates pain to ***  Radiates: Paresthesia: Grip strength: Aggravates: Treatments tried:  Pertinent review of systems: ***  Relevant historical information: ***   Exam:  There were no vitals taken for this visit. General: Well Developed, well nourished, and in no acute distress.   MSK: ***    Lab and Radiology Results No results found for this or any previous visit (from the past 72 hour(s)). CT CORONARY MORPH W/CTA COR W/SCORE W/CA W/CM &/OR WO/CM  Result Date: 02/15/2023 CLINICAL DATA:  Chest pain, coronary calcification. EXAM: Cardiac/Coronary  CTA TECHNIQUE: The patient was scanned on a Siemens Somatom go.Top scanner. : A retrospective scan was triggered in the ascending thoracic aorta. Axial non-contrast 3 mm slices were carried out through the heart. The data set was analyzed on a dedicated work station and scored using the Agatson method. Gantry rotation speed was 66 msecs and collimation was .6 mm. 100mg  of metoprolol and 0.8 mg of sl NTG was given. The 3D data set was reconstructed in 5% intervals of the 60-95 % of the R-R cycle. Diastolic phases were analyzed on a dedicated work station using MPR, MIP and VRT modes. The patient received 80 cc of contrast. FINDINGS: Aorta:  Normal size.  No calcifications.  No dissection. Aortic Valve:  Trileaflet.  No calcifications. Coronary Arteries:  Normal coronary origin.  Right dominance. RCA is a dominant artery. There is calcified plaque proximally causing minimal stenosis (<25%). Left main gives rise to LAD and LCX arteries. LM has no disease. LAD has calcified plaque proximally causing minimal stenosis (<25%). LCX is a non-dominant artery.  There is calcified plaque proximally causing minimal stenosis (<25%). Other findings: Normal pulmonary vein drainage into the left atrium. Normal left atrial appendage without a thrombus. Normal size of the pulmonary artery. IMPRESSION: 1. Coronary calcium score of 128. This was 83rd percentile for age and sex matched control. 2. Normal coronary origin with right dominance. 3. Minimal stenosis in LAD, LCx, RCA (<25%). 4. CAD-RADS 1. Minimal non-obstructive CAD (0-24%). Consider non-atherosclerotic causes of chest pain. Consider preventive therapy and risk factor modification. Electronically Signed   By: Debbe Odea M.D.   On: 02/15/2023 13:31       Assessment and Plan: 57 y.o. male with ***   PDMP not reviewed this encounter. No orders of the defined types were placed in this encounter.  No orders of the defined types were placed in this encounter.    Discussed warning signs or symptoms. Please see discharge instructions. Patient expresses understanding.   ***

## 2023-02-17 ENCOUNTER — Telehealth: Payer: Self-pay | Admitting: Internal Medicine

## 2023-02-17 NOTE — Telephone Encounter (Signed)
Patient is calling to get result from Coronary CT done 111/18. Please advice.

## 2023-02-17 NOTE — Telephone Encounter (Signed)
Patient called to follow up on CT Coronary test results.

## 2023-02-24 ENCOUNTER — Other Ambulatory Visit: Payer: Self-pay | Admitting: Internal Medicine

## 2023-02-24 ENCOUNTER — Other Ambulatory Visit: Payer: Self-pay

## 2023-02-24 DIAGNOSIS — E119 Type 2 diabetes mellitus without complications: Secondary | ICD-10-CM

## 2023-03-03 NOTE — Progress Notes (Signed)
Radiologist overread the extra-cardiac portion of the study, no acute finding

## 2023-03-04 ENCOUNTER — Ambulatory Visit: Payer: 59 | Attending: Physician Assistant | Admitting: Physician Assistant

## 2023-03-04 ENCOUNTER — Ambulatory Visit: Payer: 59 | Admitting: Internal Medicine

## 2023-03-04 ENCOUNTER — Encounter: Payer: Self-pay | Admitting: Physician Assistant

## 2023-03-04 VITALS — BP 146/100 | HR 103 | Ht 72.0 in | Wt 184.2 lb

## 2023-03-04 DIAGNOSIS — E785 Hyperlipidemia, unspecified: Secondary | ICD-10-CM | POA: Diagnosis not present

## 2023-03-04 DIAGNOSIS — I10 Essential (primary) hypertension: Secondary | ICD-10-CM

## 2023-03-04 DIAGNOSIS — I2510 Atherosclerotic heart disease of native coronary artery without angina pectoris: Secondary | ICD-10-CM | POA: Diagnosis not present

## 2023-03-04 MED ORDER — METOPROLOL TARTRATE 25 MG PO TABS: 12.5000 mg | ORAL_TABLET | Freq: Two times a day (BID) | ORAL | 11 refills | Status: DC

## 2023-03-04 NOTE — Patient Instructions (Signed)
Medication Instructions:  START METOPROLOL TARTRATE 12.5 MG TWICE DAILY *If you need a refill on your cardiac medications before your next appointment, please call your pharmacy*   Lab Work: NO LABS If you have labs (blood work) drawn today and your tests are completely normal, you will receive your results only by: MyChart Message (if you have MyChart) OR A paper copy in the mail If you have any lab test that is abnormal or we need to change your treatment, we will call you to review the results.   Testing/Procedures: NO TESTING   Follow-Up: At [facility], you and your health needs are our priority.  As part of our continuing mission to provide you with exceptional heart care, we have created designated Provider Care Teams.  These Care Teams include your primary Cardiologist (physician) and Advanced Practice Providers (APPs -  Physician Assistants and Nurse Practitioners) who all work together to provide you with the care you need, when you need it.   Your next appointment:   6 month(s)  Provider:   Maisie Fus, MD

## 2023-03-04 NOTE — Progress Notes (Signed)
Cardiology Office Note:  .   Date:  03/04/2023  ID:  Jacob [name], DOB [birth year], MRN [id number] PCP: Corwin Levins, MD  [facility] Providers Cardiologist:  Maisie Fus, MD     History of Present Illness: .   Jacob [name] is a 57 y.o. male with PMH of HTN, anxiety, GERD and elevated coronary calcium score.  Echocardiogram in July 2017 was normal.  Patient had a Myoview in 2018 that was normal.  His brother had 2 MIs.  Calcium scoring test obtained on 07/21/2021 demonstrated coronary calcium score of 74 which placed the patient at 78th percentile for age and sex matched control, chronic calcium fairly evenly distributed in all 3 major coronary vessels.  Treadmill Myoview obtained on 01/06/2022 was low risk, patient was able to exercise for 9 minutes and 0 seconds, he achieved 10.1 METS of activity.  He did not experience any angina during that treadmill test.  Perfusion imaging was normal with no evidence of infarction or ischemia.  He was recently seen by PCP on 01/27/2023 at which time he describes some chest discomfort.  He was referred to cardiology service for further evaluation.   I last saw the patient on 02/08/2023 at which time he described chest pain roughly about 1-2 times per week and usually last about 5 to 10 minutes each.  Symptoms did not have direct association with degree of physical exertion.  He never smoked in the past unlike his brother who had MI.  He also underwent right shoulder repair in September and still had a lot of pain in the right shoulder area.  I recommended a coronary CT to further investigate.  Coronary CT showed minimal disease less than 25% in all 3 major coronary vessels.  His last LDL was borderline at 70.  LDL goal should be less than 70 in this case.  He will work on his diet and exercise.  His blood pressure remained borderline elevated, I recommended addition of metoprolol tartrate to 12.5 mg twice a day.  He can follow-up with Dr. Carolan Clines  in 6 months.  Overall his recent study was reassuring.   ROS:   He denies chest pain, palpitations, dyspnea, pnd, orthopnea, n, v, dizziness, syncope, edema, weight gain, or early satiety. All other systems reviewed and are otherwise negative except as noted above.    Studies Reviewed: .        Cardiac Studies & Procedures     STRESS TESTS  MYOCARDIAL PERFUSION IMAGING 01/06/2022  Narrative   The study is normal. The study is low risk.   Exercise capacity was normal. Patient exercised for 9 min and 0 sec. Maximum HR of 150 bpm. MPHR 90.0 %. Peak METS 10.1 . The patient experienced no angina during the test. Normal blood pressure and normal heart rate response noted during stress. Heart rate recovery was normal.   No ST deviation was noted.   LV perfusion is normal. There is no evidence of ischemia. There is no evidence of infarction.   Left ventricular function is normal. End diastolic cavity size is normal. End systolic cavity size is normal.   Prior study available for comparison from 07/02/2016.   ECHOCARDIOGRAM  ECHOCARDIOGRAM COMPLETE 09/28/2015  Narrative *[place]* *[facility]* [address] [place], [place] [address] [phone]  ------------------------------------------------------------------- Transthoracic Echocardiography  [name], Jacob [name] MR #:       [id number] Study Date: 09/28/2015 Gender:  M Age:        49 Height:     180.3 cm Weight:     87.9 kg BSA:        2.11 m^2 Pt. Status: Room:       5C15C  SONOGRAPHER  Sherren Kerns, RVS ORDERING     Adrian Saran [id number] SONOGRAPHER  Delcie Roch, RDCS, CCT PERFORMING   [facility], Inpatient  cc:  ------------------------------------------------------------------- LV EF: 55% -   60%  ------------------------------------------------------------------- Indications:      Syncope  780.2.  ------------------------------------------------------------------- History:   Risk factors:  Hypertension. Diabetes mellitus. Dyslipidemia.  ------------------------------------------------------------------- Study Conclusions  - Left ventricle: The cavity size was normal. Systolic function was normal. The estimated ejection fraction was in the range of 55% to 60%. Wall motion was normal; there were no regional wall motion abnormalities.  Transthoracic echocardiography.  M-mode, complete 2D, spectral Doppler, and color Doppler.  Birthdate:  Patient birthdate: 08/30/1965.  Age:  Patient is 57 yr old.  Sex:  Gender: male. BMI: 27 kg/m^2.  Blood pressure:     98/64  Patient status: Inpatient.  Study date:  Study date: 09/28/2015. Study time: 07:57 AM.  Location:  Echo laboratory.  -------------------------------------------------------------------  ------------------------------------------------------------------- Left ventricle:  The cavity size was normal. Systolic function was normal. The estimated ejection fraction was in the range of 55% to 60%. Wall motion was normal; there were no regional wall motion abnormalities.  ------------------------------------------------------------------- Aortic valve:   Trileaflet; normal thickness leaflets. Mobility was not restricted.  Doppler:  Transvalvular velocity was within the normal range. There was no stenosis. There was no regurgitation.  ------------------------------------------------------------------- Aorta:  Aortic root: The aortic root was normal in size.  ------------------------------------------------------------------- Mitral valve:   Structurally normal valve.   Mobility was not restricted.  Doppler:  Transvalvular velocity was within the normal range. There was no evidence for stenosis. There was trivial regurgitation.    Peak gradient (D): 3 mm  Hg.  ------------------------------------------------------------------- Left atrium:  The atrium was normal in size.  ------------------------------------------------------------------- Right ventricle:  The cavity size was normal. Wall thickness was normal. Systolic function was normal.  ------------------------------------------------------------------- Pulmonic valve:   Not well visualized.  The valve appears to be grossly normal.    Doppler:  Transvalvular velocity was within the normal range. There was no evidence for stenosis.  ------------------------------------------------------------------- Tricuspid valve:   Structurally normal valve.    Doppler: Transvalvular velocity was within the normal range. There was no regurgitation.  ------------------------------------------------------------------- Pulmonary artery:   The main pulmonary artery was normal-sized.  ------------------------------------------------------------------- Right atrium:  The atrium was normal in size.  ------------------------------------------------------------------- Pericardium:  There was no pericardial effusion.  ------------------------------------------------------------------- Systemic veins: Inferior vena cava: The vessel was normal in size.  ------------------------------------------------------------------- Measurements  Left ventricle                         Value        Reference LV ID, ED, PLAX chordal                45.3  mm     43 - 52 LV ID, ES, PLAX chordal                30.4  mm     23 - 38 LV fx shortening, PLAX chordal         33    %      >=29 LV PW thickness,  ED                    8.83  mm     --------- IVS/LV PW ratio, ED                    1            <=1.3 LV e&', lateral                         11.1  cm/s   --------- LV E/e&', lateral                       8.34         --------- LV e&', medial                          9.03  cm/s   --------- LV E/e&', medial                         10.25        --------- LV e&', average                         10.07 cm/s   --------- LV E/e&', average                       9.2          ---------  Ventricular septum                     Value        Reference IVS thickness, ED                      8.83  mm     ---------  LVOT                                   Value        Reference LVOT ID, S                             22    mm     --------- LVOT area                              3.8   cm^2   ---------  Aorta                                  Value        Reference Aortic root ID, ED                     29    mm     ---------  Left atrium                            Value        Reference LA ID, A-P, ES  26    mm     --------- LA ID/bsa, A-P                         1.23  cm/m^2 <=2.2 LA volume, S                           48.8  ml     --------- LA volume/bsa, S                       23.1  ml/m^2 --------- LA volume, ES, 1-p A4C                 44    ml     --------- LA volume/bsa, ES, 1-p A4C             20.8  ml/m^2 --------- LA volume, ES, 1-p A2C                 47.4  ml     --------- LA volume/bsa, ES, 1-p A2C             22.4  ml/m^2 ---------  Mitral valve                           Value        Reference Mitral E-wave peak velocity            92.6  cm/s   --------- Mitral A-wave peak velocity            52.4  cm/s   --------- Mitral deceleration time       (H)     301   ms     150 - 230 Mitral peak gradient, D                3     mm Hg  --------- Mitral E/A ratio, peak                 1.8          ---------  Systemic veins                         Value        Reference Estimated CVP                          3     mm Hg  ---------  Right ventricle                        Value        Reference TAPSE                                  19.5  mm     --------- RV s&', lateral, S                      11.6  cm/s   ---------  Legend: (L)  and  (H)  mark values outside specified reference  range.  ------------------------------------------------------------------- Prepared and Electronically Authenticated by  Georga Hacking, MD [facility] 2017-07-01T12:39:18     CT SCANS  CT CORONARY MORPH W/CTA COR W/SCORE 02/15/2023  Addendum 03/02/2023  6:59 PM ADDENDUM REPORT: 03/02/2023 18:56  EXAM: OVER-READ INTERPRETATION  CT CHEST  The following report is an over-read performed by radiologist Dr. Narda Rutherford of [facility] on 03/02/2023. This over-read does not include interpretation of cardiac or coronary anatomy or pathology. The coronary CTA interpretation by the cardiologist is attached.  COMPARISON:  Cardiac CT 07/21/2021  FINDINGS: Vascular: No aortic atherosclerosis. The included aorta is normal in caliber.  Mediastinum/nodes: No adenopathy or mass.  Patulous esophagus.  Lungs: No focal airspace disease. Scarring in the medial right lower lobe has a slightly nodular appearance, but appears linear on reformats, adjacent to osteophytes. No pleural fluid. The included airways are patent.  Upper abdomen: No acute or unexpected findings.  Musculoskeletal: There are no acute or suspicious osseous abnormalities.  IMPRESSION: No acute or unexpected extracardiac findings.   Electronically Signed By: Narda Rutherford M.D. On: 03/02/2023 18:56  Narrative CLINICAL DATA:  Chest pain, coronary calcification.  EXAM: Cardiac/Coronary  CTA  TECHNIQUE: The patient was scanned on a Siemens Somatom go.Top scanner.  : A retrospective scan was triggered in the ascending thoracic aorta. Axial non-contrast 3 mm slices were carried out through the heart. The data set was analyzed on a dedicated work station and scored using the Agatson method. Gantry rotation speed was 66 msecs and collimation was .6 mm. 100mg  of metoprolol and 0.8 mg of sl NTG was given. The 3D data set was reconstructed in 5% intervals of the 60-95 % of the R-R cycle. Diastolic  phases were analyzed on a dedicated work station using MPR, MIP and VRT modes. The patient received 80 cc of contrast.  FINDINGS: Aorta:  Normal size.  No calcifications.  No dissection.  Aortic Valve:  Trileaflet.  No calcifications.  Coronary Arteries:  Normal coronary origin.  Right dominance.  RCA is a dominant artery. There is calcified plaque proximally causing minimal stenosis (<25%).  Left main gives rise to LAD and LCX arteries. LM has no disease.  LAD has calcified plaque proximally causing minimal stenosis (<25%).  LCX is a non-dominant artery. There is calcified plaque proximally causing minimal stenosis (<25%).  Other findings:  Normal pulmonary vein drainage into the left atrium.  Normal left atrial appendage without a thrombus.  Normal size of the pulmonary artery.  IMPRESSION: 1. Coronary calcium score of 128. This was 83rd percentile for age and sex matched control.  2. Normal coronary origin with right dominance.  3. Minimal stenosis in LAD, LCx, RCA (<25%).  4. CAD-RADS 1. Minimal non-obstructive CAD (0-24%). Consider non-atherosclerotic causes of chest pain. Consider preventive therapy and risk factor modification.  Electronically Signed: By: Debbe Odea M.D. On: 02/15/2023 13:31   CT SCANS  CT CARDIAC SCORING (SELF PAY ONLY) 07/21/2021  Addendum 07/21/2021 11:39 PM ADDENDUM REPORT: 07/21/2021 23:37  CLINICAL DATA:  Cardiovascular Disease Risk stratification  EXAM: Coronary Calcium Score  TECHNIQUE: A gated, non-contrast computed tomography scan of the heart was performed using 3mm slice thickness. Axial images were analyzed on a dedicated workstation. Calcium scoring of the coronary arteries was performed using the Agatston method.  FINDINGS: Coronary arteries: Normal origins.  Coronary Calcium Score:  Left main:  Left anterior descending artery: 29  Left circumflex artery: 18  Right coronary artery: 27  Total:  74  Percentile: 78  Pericardium: Normal.  Ascending Aorta: Normal caliber.  Non-cardiac: See separate report from [facility].  IMPRESSION: Coronary calcium score of 74. This was 78th percentile for age-, race-, and sex-matched controls.  RECOMMENDATIONS: Coronary artery calcium (CAC) score is a strong predictor of incident coronary heart disease (CHD) and provides predictive information beyond traditional risk factors. CAC scoring is reasonable to use in the decision to withhold, postpone, or initiate statin therapy in intermediate-risk or selected borderline-risk asymptomatic adults (age 60-75 years and LDL-C >=70 to <190 mg/dL) who do not have diabetes or established atherosclerotic cardiovascular disease (ASCVD).* In intermediate-risk (10-year ASCVD risk >=7.5% to <20%) adults or selected borderline-risk (10-year ASCVD risk >=5% to <7.5%) adults in whom a CAC score is measured for the purpose of making a treatment decision the following recommendations have been made:  If CAC=0, it is reasonable to withhold statin therapy and reassess in 5 to 10 years, as long as higher risk conditions are absent (diabetes mellitus, family history of premature CHD in first degree relatives (males <55 years; females <65 years), cigarette smoking, or LDL >=190 mg/dL).  If CAC is 1 to 99, it is reasonable to initiate statin therapy for patients >=77 years of age.  If CAC is >=100 or >=75th percentile, it is reasonable to initiate statin therapy at any age.  Cardiology referral should be considered for patients with CAC scores >=400 or >=75th percentile.  *2018 AHA/ACC/AACVPR/AAPA/ABC/ACPM/ADA/AGS/APhA/ASPC/NLA/PCNA Guideline on the Management of Blood Cholesterol: A Report of the American College of Cardiology/American Heart Association Task Force on Clinical Practice Guidelines. J Am Coll Cardiol. 2019;73(24):3168-3209.  Epifanio Lesches, MD   Electronically  Signed By: Epifanio Lesches M.D. On: 07/21/2021 23:37  Narrative EXAM: OVER-READ INTERPRETATION  CT CHEST  The following report is an over-read performed by radiologist Dr. Genevive Bi of [facility] on 07/21/2021. This over-read does not include interpretation of cardiac or coronary anatomy or pathology. The calcium score interpretation by the cardiologist is attached.  COMPARISON:  None.  FINDINGS: Limited view of the lung parenchyma demonstrates no suspicious nodularity. Airways are normal.  Limited view of the mediastinum demonstrates no adenopathy. Esophagus normal.  Limited view of the upper abdomen unremarkable.  Limited view of the skeleton and chest wall is unremarkable.  IMPRESSION: No significant extracardiac findings.  Electronically Signed: By: Genevive Bi M.D. On: 07/21/2021 11:27          Risk Assessment/Calculations:     HYPERTENSION CONTROL Vitals:   03/04/23 0944 03/04/23 1040  BP: (!) 142/94 (!) 146/100    The patient's blood pressure is elevated above target today.  In order to address the patient's elevated BP: A new medication was prescribed today.          Physical Exam:   VS:  BP (!) 146/100   Pulse (!) 103   Ht 6' (1.829 m)   Wt 184 lb 3.2 oz (83.6 kg)   SpO2 96%   BMI 24.98 kg/m    Wt Readings from Last 3 Encounters:  03/04/23 184 lb 3.2 oz (83.6 kg)  02/08/23 183 lb 6.4 oz (83.2 kg)  01/27/23 174 lb (78.9 kg)    GEN: Well nourished, well developed in no acute distress NECK: No JVD; No carotid bruits CARDIAC: RRR, no murmurs, rubs, gallops RESPIRATORY:  Clear to auscultation without rales, wheezing or rhonchi  ABDOMEN: Soft, non-tender, non-distended EXTREMITIES:  No edema; No deformity   ASSESSMENT AND PLAN: .    Coronary Artery Disease Elevated coronary calcium score with minimal disease on coronary CT. Discussed the importance of risk factor control, including cholesterol and blood pressure  management. LDL borderline at 70, goal is less than 70. -Continue current cholesterol management  plan with diet and exercise. -Add Metoprolol Tartrate 12.5mg  twice daily for blood pressure control.  Hypertension Blood pressure remains borderline elevated despite current management. -Add Metoprolol Tartrate 12.5mg  twice daily.  Hyperlipidemia: Continue on Crestor.  LDL at 70, encourage diet and activity.       Dispo: Follow-up with Dr. Carolan Clines in 6 months.  Signed, Azalee Course, PA

## 2023-03-08 ENCOUNTER — Ambulatory Visit: Payer: 59 | Admitting: Internal Medicine

## 2023-03-08 ENCOUNTER — Encounter: Payer: Self-pay | Admitting: Internal Medicine

## 2023-03-08 VITALS — BP 134/76 | HR 67 | Temp 98.0°F | Ht 72.0 in | Wt 187.0 lb

## 2023-03-08 DIAGNOSIS — E7800 Pure hypercholesterolemia, unspecified: Secondary | ICD-10-CM

## 2023-03-08 DIAGNOSIS — E538 Deficiency of other specified B group vitamins: Secondary | ICD-10-CM | POA: Diagnosis not present

## 2023-03-08 DIAGNOSIS — H6122 Impacted cerumen, left ear: Secondary | ICD-10-CM

## 2023-03-08 DIAGNOSIS — E0865 Diabetes mellitus due to underlying condition with hyperglycemia: Secondary | ICD-10-CM | POA: Diagnosis not present

## 2023-03-08 DIAGNOSIS — I10 Essential (primary) hypertension: Secondary | ICD-10-CM

## 2023-03-08 MED ORDER — TADALAFIL 20 MG PO TABS: 20.0000 mg | ORAL_TABLET | Freq: Every day | ORAL | 11 refills | Status: AC | PRN

## 2023-03-08 MED ORDER — REPAGLINIDE 1 MG PO TABS: 1.0000 mg | ORAL_TABLET | Freq: Three times a day (TID) | ORAL | 3 refills | Status: DC

## 2023-03-08 NOTE — Patient Instructions (Addendum)
Ok to increase the Prandin 1 mg three times per day  Your left ear was cleared of wax today  Please restart the OTC B12 1000 mcg per day  Please take all new medication as prescribed - the Cialis 20 mg per day as needed  Please continue all other medications as before, and refills have been done if requested.  Please have the pharmacy call with any other refills you may need.  Please continue your efforts at being more active, low cholesterol diet, and weight control.  Please keep your appointments with your specialists as you may have planned  Please make an Appointment to return in 3 months, or sooner if needed, also with Lab Appointment for testing done 3-5 days before at the FIRST FLOOR Lab (so this is for TWO appointments - please see the scheduling desk as you leave)

## 2023-03-08 NOTE — Progress Notes (Unsigned)
Patient ID: Jacob [name], male   DOB: 03-09-1966, 57 y.o.   MRN: [id number]        Chief Complaint: follow up HTN, HLD and DM, low b12       HPI:  Jacob [name] is a 57 y.o. male here after right shoulder rot cuff surgury with PT twice per wk,ST disablity runs out dec 20 and hoping to get back to work, but not back to work yet (no light duty) and gained 12 lbs overall with less active; now starting to go back to gym , still sugars have been upt 300 post prandial and asking for increased prandin.  Not taking B12.  Has worsening ED symptoms in past 6 months, asks for PDE5.  Also has left hearing loss worse in the past few wks, ? Wax impaction again.  Pt denies chest pain, increased sob or doe, wheezing, orthopnea, PND, increased LE swelling, palpitations, dizziness or syncope.   Pt denies polydipsia, polyuria, or new focal neuro s/s.    Pt denies fever, wt loss, night sweats, loss of appetite, or other constitutional symptoms  Wt Readings from Last 3 Encounters:  03/08/23 187 lb (84.8 kg)  03/04/23 184 lb 3.2 oz (83.6 kg)  02/08/23 183 lb 6.4 oz (83.2 kg)   BP Readings from Last 3 Encounters:  03/08/23 134/76  03/04/23 (!) 146/100  02/15/23 123/70         Past Medical History:  Diagnosis Date   Allergic rhinitis    Anxiety    Arthritis    Cholesteatoma of right ear    Diabetes mellitus (HCC)    bordreline   Dysuria    Enlarged prostate    pt unaware   GERD (gastroesophageal reflux disease)    Headache    Hearing loss    Right ear   Hernia of abdominal wall    History of gross hematuria    History of head injury    History of kidney stones    History of palpitations    Hyperlipidemia 10/18/2014   Hypertension    Insomnia    Lack of concentration    Nasal fracture    Several   Otitis media 11/10/2017   Bilateral   Panic attacks    Syncope    Vertigo    Past Surgical History:  Procedure Laterality Date   CHOLECYSTECTOMY     CYSTOSCOPY N/A 05/10/2018   Procedure:  CYSTOSCOPY WITH EXAM UNDER ANESTHESIA;  Surgeon: Rene Paci, MD;  Location: [facility];  Service: Urology;  Laterality: N/A;  ONLY NEEDS 30 MIN   CYSTOSCOPY N/A 07/17/2022   Procedure: CYSTOSCOPY, BILATERAL RETROGRADE PYELOGRAM;  Surgeon: Crista Elliot, MD;  Location: [facility];  Service: Urology;  Laterality: N/A;   HYPOSPADIAS CORRECTION N/A 1978   INNER EAR SURGERY     right   LUMBAR DISC SURGERY     MASTOIDECTOMY Right    NOSE SURGERY     Fracture   tubes in bil ears     UPPER GASTROINTESTINAL ENDOSCOPY     UPPER GI ENDOSCOPY  01/09/2012   Esophageal dilation    reports that he has never smoked. He has never used smokeless tobacco. He reports current alcohol use of about 5.0 standard drinks of alcohol per week. He reports that he does not use drugs. family history includes ADD / ADHD in his son; Anxiety disorder in his cousin, maternal aunt, maternal grandmother, maternal uncle, and mother;  COPD in his mother; Cancer in his father; Diabetes in his maternal grandmother and paternal grandmother; Drug abuse in his son; Heart attack in his brother; Heart disease in his father; Hypertension in his mother; Lung cancer in his maternal uncle; OCD in his maternal aunt. No Known Allergies Current Outpatient Medications on File Prior to Visit  Medication Sig Dispense Refill   ALPRAZolam (XANAX) 0.5 MG tablet TAKE 1 TO 2 TABLETS BY MOUTH AT BEDTIME AS NEEDED 60 tablet 5   aspirin EC 81 MG tablet Take 1 tablet (81 mg total) by mouth daily. 90 tablet 11   cyanocobalamin (VITAMIN B12) 1000 MCG tablet Take 1 tablet (1,000 mcg total) by mouth daily. 90 tablet 1   empagliflozin (JARDIANCE) 25 MG TABS tablet Take 1 tablet (25 mg total) by mouth daily before breakfast. 90 tablet 3   metFORMIN (GLUCOPHAGE-XR) 500 MG 24 hr tablet TAKE 4 TABLETS BY MOUTH ONCE DAILY WITH BREAKFAST 360 tablet 3   metoprolol tartrate (LOPRESSOR) 25 MG tablet Take 0.5 tablets  (12.5 mg total) by mouth 2 (two) times daily. 60 tablet 11   Multiple Vitamin (MULTIVITAMIN WITH MINERALS) TABS tablet Take 1 tablet by mouth daily.     Omega-3 Fatty Acids (FISH OIL PO) Take 1 capsule by mouth daily.     pantoprazole (PROTONIX) 40 MG tablet Take 1 tablet by mouth twice daily 180 tablet 3   ramipril (ALTACE) 10 MG capsule Take 1 capsule by mouth once daily 90 capsule 3   rosuvastatin (CRESTOR) 10 MG tablet Take 1 tablet by mouth once daily 90 tablet 3   metoprolol tartrate (LOPRESSOR) 100 MG tablet Take 1 tablet (100 mg total) by mouth once for 1 dose. TAKE 2 HOURS PRIOR TO PROCEDURE 1 tablet 0   No current facility-administered medications on file prior to visit.        ROS:  All others reviewed and negative.  Objective        PE:  BP 134/76 (BP Location: Left Arm, Patient Position: Sitting, Cuff Size: Normal)   Pulse 67   Temp 98 F (36.7 C) (Oral)   Ht 6' (1.829 m)   Wt 187 lb (84.8 kg)   SpO2 100%   BMI 25.36 kg/m                 Constitutional: Pt appears in NAD               HENT: Head: NCAT.                Right Ear: External ear normal.                 Left Ear: External ear normal.                Eyes: . Pupils are equal, round, and reactive to light. Conjunctivae and EOM are normal               Nose: without d/c or deformity               Neck: Neck supple. Gross normal ROM               Cardiovascular: Normal rate and regular rhythm.                 Pulmonary/Chest: Effort normal and breath sounds without rales or wheezing.                Abd:  Soft, NT, ND, + BS,  no organomegaly               Neurological: Pt is alert. At baseline orientation, motor grossly intact               Skin: Skin is warm. No rashes, no other new lesions, LE edema - none               Psychiatric: Pt behavior is normal without agitation   Micro: none  Cardiac tracings I have personally interpreted today:  none  Pertinent Radiological findings (summarize): none   Lab  Results  Component Value Date   WBC 5.5 07/03/2022   HGB 16.7 07/17/2022   HCT 49.0 07/17/2022   PLT 259.0 07/03/2022   GLUCOSE 218 (H) 02/08/2023   CHOL 151 09/16/2022   TRIG 128.0 09/16/2022   HDL 54.60 09/16/2022   LDLDIRECT 102.0 11/23/2016   LDLCALC 70 09/16/2022   ALT 34 09/16/2022   AST 24 09/16/2022   NA 137 02/08/2023   K 4.8 02/08/2023   CL 100 02/08/2023   CREATININE 1.00 02/08/2023   BUN 18 02/08/2023   CO2 21 02/08/2023   TSH 1.69 07/03/2022   PSA 2.06 09/16/2022   INR 0.96 12/11/2017   HGBA1C 6.4 (A) 01/27/2023   MICROALBUR <0.7 09/16/2022   Assessment/Plan:  Jacob [name] is a 57 y.o. White or Caucasian [1] male with  has a past medical history of Allergic rhinitis, Anxiety, Arthritis, Cholesteatoma of right ear, Diabetes mellitus (HCC), Dysuria, Enlarged prostate, GERD (gastroesophageal reflux disease), Headache, Hearing loss, Hernia of abdominal wall, History of gross hematuria, History of head injury, History of kidney stones, History of palpitations, Hyperlipidemia (10/18/2014), Hypertension, Insomnia, Lack of concentration, Nasal fracture, Otitis media (11/10/2017), Panic attacks, Syncope, and Vertigo.  Hyperlipidemia Lab Results  Component Value Date   LDLCALC 70 09/16/2022   Uncontrolled, , pt to continue current statin crestor 10 mg every day, declines other change for now   Essential hypertension, benign BP Readings from Last 3 Encounters:  03/08/23 134/76  03/04/23 (!) 146/100  02/15/23 123/70   Stable, pt to continue medical treatment lopressor 25 bid, altace 10 qd   Diabetes mellitus due to underlying condition with hyperglycemia, without long-term current use of insulin (HCC) Lab Results  Component Value Date   HGBA1C 6.4 (A) 01/27/2023   uncontrolled, pt to continue current medical treatment jardiance 25 every day, metfomrmin ER 500 mg- 4 every day, but increased prandin 1 mg tid   B12 deficiency Lab Results  Component Value Date    VITAMINB12 147 (L) 09/16/2022   Low, to start oral replacement - b12 1000 mcg qd   Impacted cerumen, left ear Resolved today,  to f/u any worsening symptoms or concerns  Followup: Return in about 3 months (around 06/06/2023).  Oliver Barre, MD 03/11/2023 8:39 PM [facility]

## 2023-03-11 ENCOUNTER — Encounter: Payer: Self-pay | Admitting: Internal Medicine

## 2023-03-11 NOTE — Assessment & Plan Note (Signed)
Lab Results  Component Value Date   LDLCALC 70 09/16/2022   Uncontrolled, , pt to continue current statin crestor 10 mg every day, declines other change for now

## 2023-03-11 NOTE — Assessment & Plan Note (Signed)
Resolved today,  to f/u any worsening symptoms or concerns  

## 2023-03-11 NOTE — Assessment & Plan Note (Signed)
BP Readings from Last 3 Encounters:  03/08/23 134/76  03/04/23 (!) 146/100  02/15/23 123/70   Stable, pt to continue medical treatment lopressor 25 bid, altace 10 qd

## 2023-03-11 NOTE — Assessment & Plan Note (Signed)
Lab Results  Component Value Date   HGBA1C 6.4 (A) 01/27/2023   uncontrolled, pt to continue current medical treatment jardiance 25 every day, metfomrmin ER 500 mg- 4 every day, but increased prandin 1 mg tid

## 2023-03-11 NOTE — Assessment & Plan Note (Signed)
Lab Results  Component Value Date   VITAMINB12 147 (L) 09/16/2022   Low, to start oral replacement - b12 1000 mcg qd

## 2023-03-20 ENCOUNTER — Other Ambulatory Visit: Payer: Self-pay | Admitting: Internal Medicine

## 2023-03-22 ENCOUNTER — Other Ambulatory Visit: Payer: Self-pay

## 2023-03-31 IMAGING — US US ABDOMEN COMPLETE
1 series · 15 of 25 positions shown · non-contrast
Comparison: None Available.

CLINICAL DATA: Abdominal pain

EXAM:
ABDOMEN ULTRASOUND COMPLETE

[Series 1: us abdomen complete mc & wl · 15 of 69 slices shown]
[im 1/69]
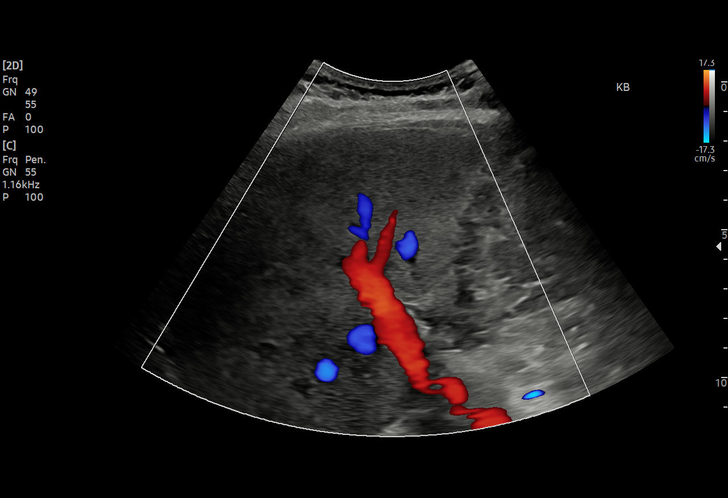
[im 6/69]
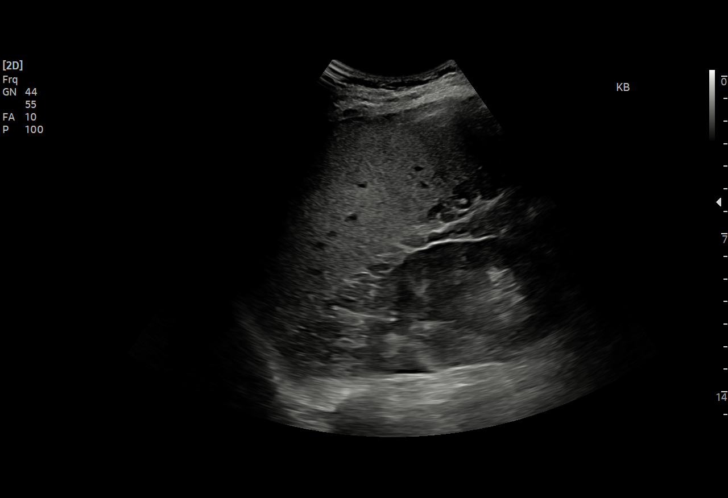
[im 12/69]
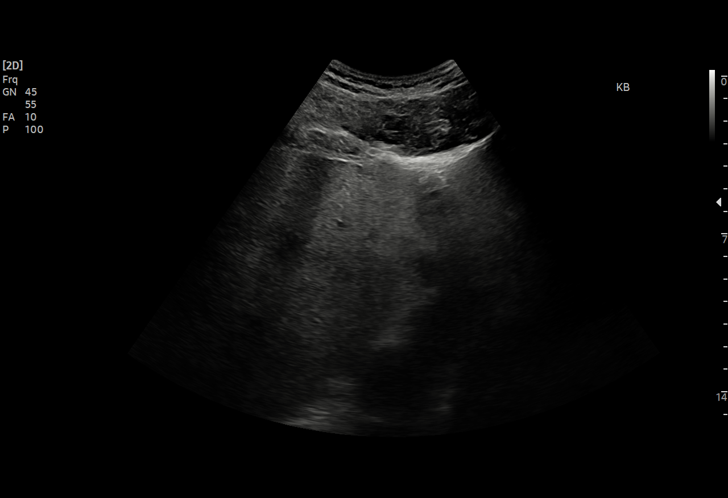
[im 15/69]
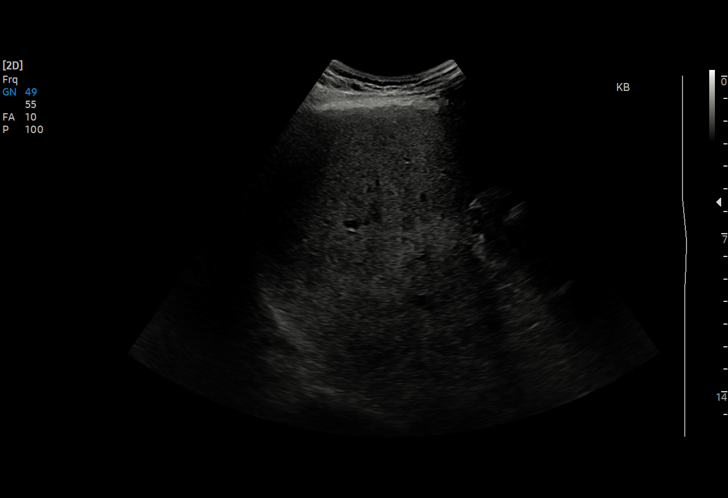
[im 20/69]
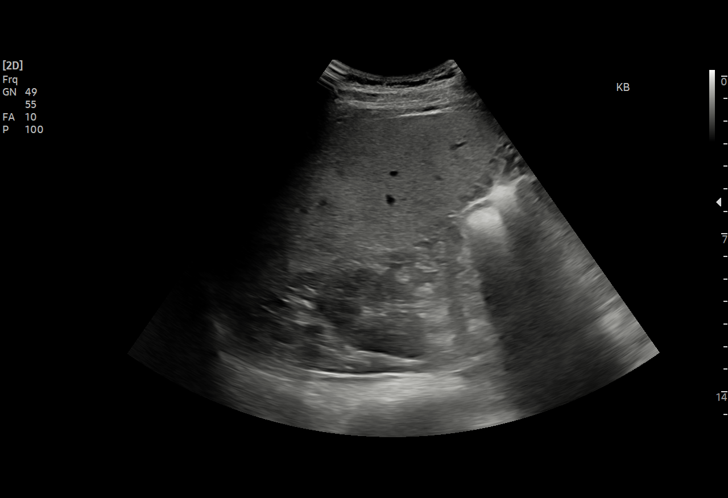
[im 26/69]
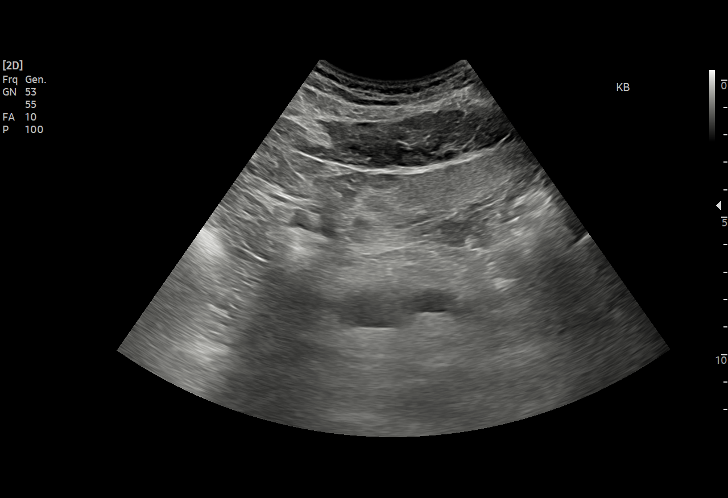
[im 29/69]
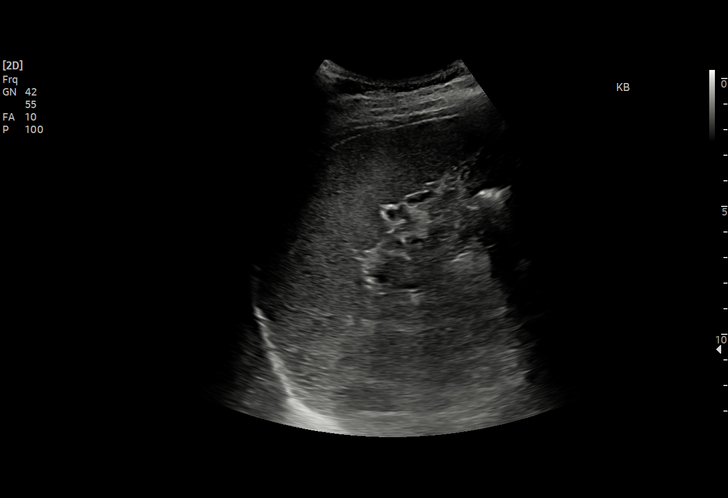
[im 35/69]
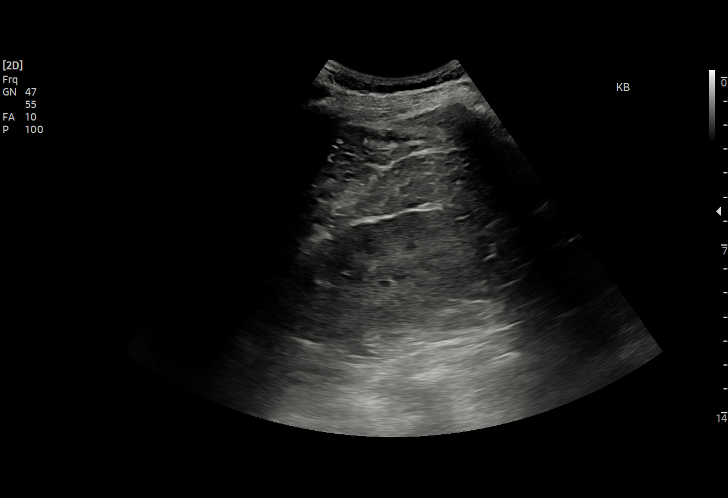
[im 40/69]
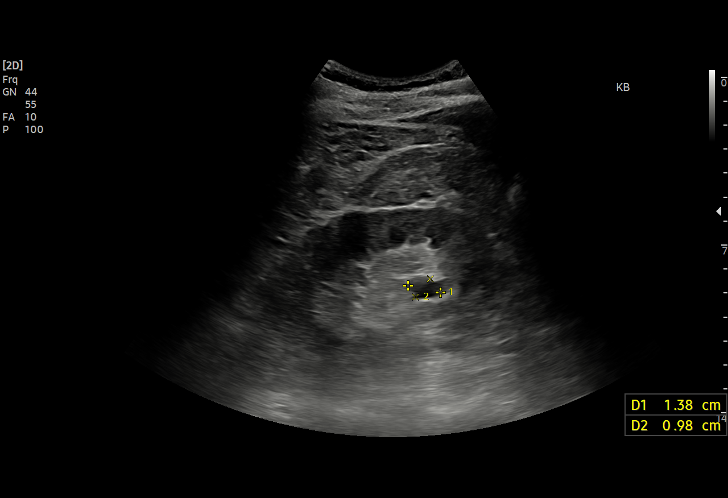
[im 43/69]
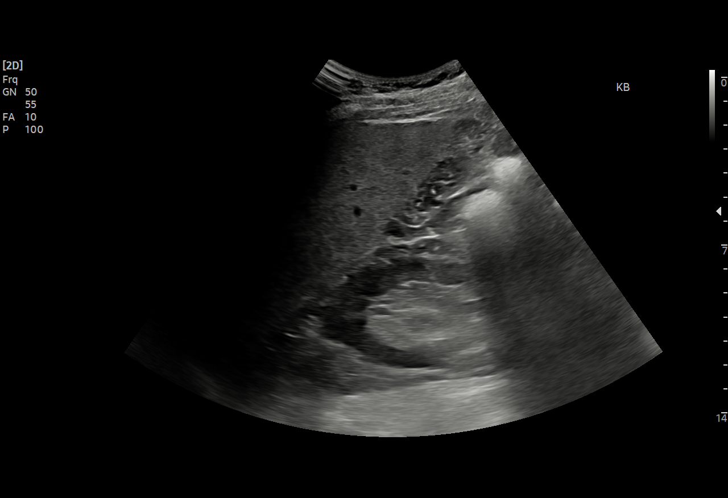
[im 49/69]
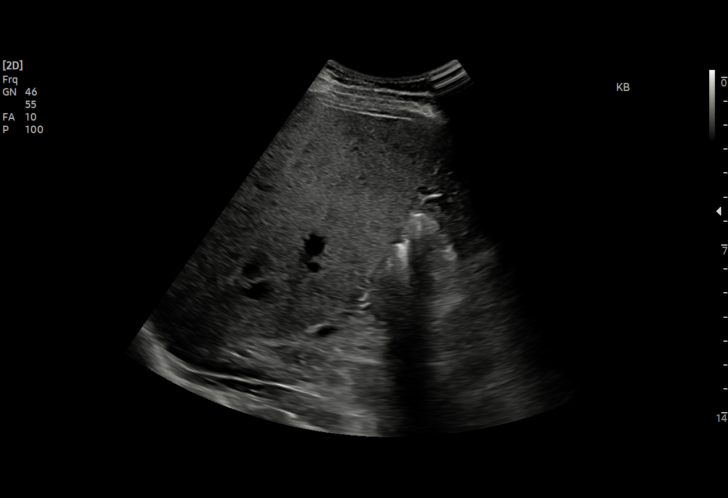
[im 54/69]
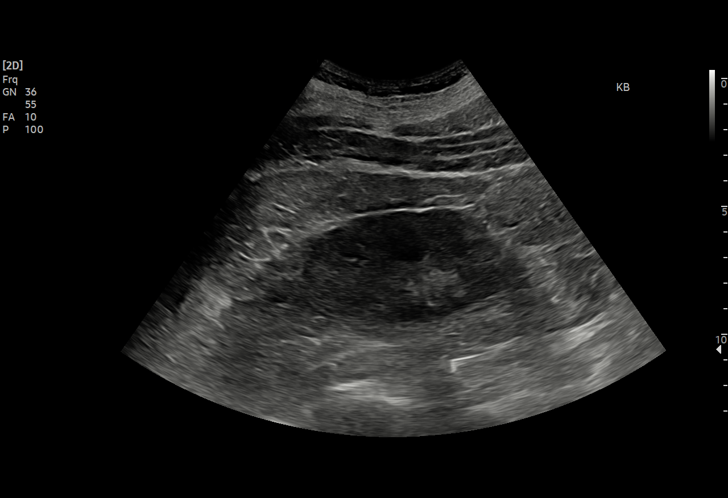
[im 57/69]
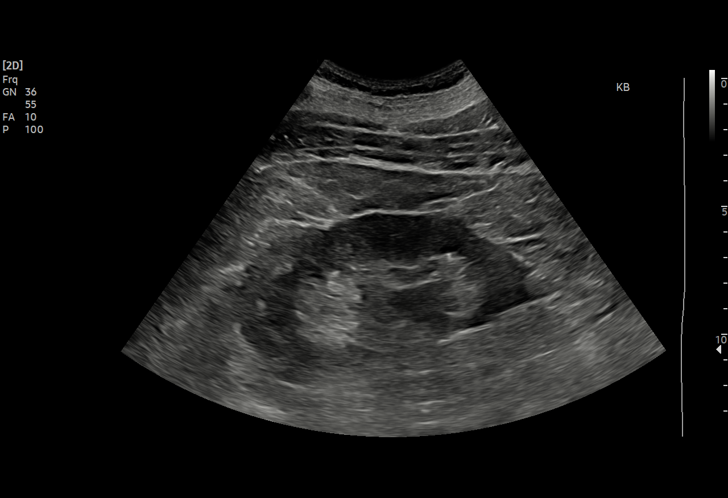
[im 63/69]
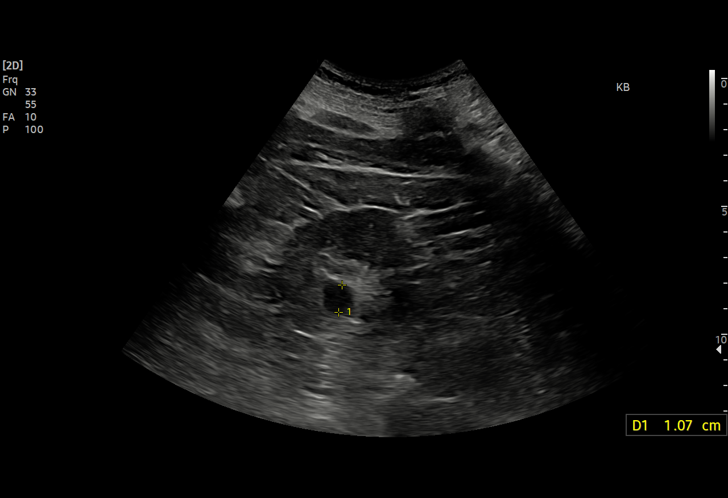
[im 69/69]
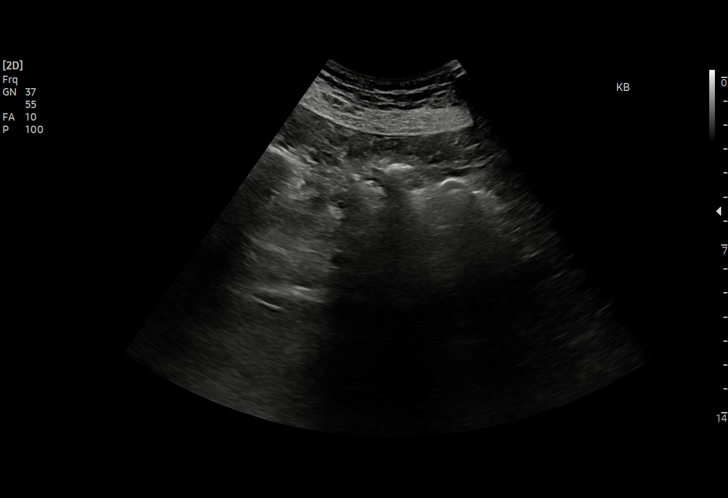

[15 of 25 positions shown; findings below may reference images not displayed]

FINDINGS: Gallbladder: Surgically absent

Common bile duct: Diameter: 3 mm

Liver: Increased echogenicity of the parenchyma with no focal mass
identified. Portal vein is patent on color Doppler imaging with
normal direction of blood flow towards the liver.

IVC: No abnormality visualized.

Pancreas: Visualized portion unremarkable.

Spleen: Size and appearance within normal limits.

Right Kidney: Length: 10 cm. Echogenicity within normal limits.
cm anechoic parapelvic cyst. No suspicious mass or hydronephrosis
visualized.

Left Kidney: Length: 11.9 cm. Echogenicity within normal limits.
cm anechoic parapelvic cyst. No suspicious mass or hydronephrosis
visualized.

Abdominal aorta: No aneurysm visualized.

Other findings: None.
IMPRESSION: 1. Increased echogenicity of the liver parenchyma which may
represent hepatic steatosis and/or other hepatocellular disease.
2. Small renal cysts.

## 2023-04-15 NOTE — Telephone Encounter (Unsigned)
Copied from CRM [id number]. Topic: Clinical - Prescription Issue >> Apr 15, 2023 11:36 AM Elizebeth Brooking wrote: Reason for CRM: Annice Pih from [facility] called in regarding patient prescriptions, stated she knows that patient has been getting prescribe ALPRAZolam Prudy Feeler) 0.5 MG tablet from Dr.John office but he is also prescribed Tramadol from [facility] . She is requesting a callback at [phone]

## 2023-04-16 ENCOUNTER — Ambulatory Visit: Payer: 59 | Admitting: Internal Medicine

## 2023-06-03 ENCOUNTER — Other Ambulatory Visit: Payer: Self-pay | Admitting: Internal Medicine

## 2023-06-04 ENCOUNTER — Other Ambulatory Visit: Payer: Self-pay

## 2023-06-17 ENCOUNTER — Encounter: Payer: Self-pay | Admitting: Physician Assistant

## 2023-06-23 ENCOUNTER — Other Ambulatory Visit: Payer: Self-pay | Admitting: Internal Medicine

## 2023-06-29 ENCOUNTER — Telehealth: Payer: Self-pay | Admitting: Internal Medicine

## 2023-06-29 ENCOUNTER — Telehealth: Payer: Self-pay | Admitting: Physician Assistant

## 2023-06-29 NOTE — Telephone Encounter (Signed)
 Patient identification verified by 2 forms. Shade Flood, RN     Called and spoke to patient  Patient states:  - BP 129/87  HR 47 - chest pain  last night and this morning  - amlodipine 10mg  daily not listed on medication list but his PCP started  - have not taken any medications this morning  - experiencing nausea for last 2-3 wks.   Patient denies:  - cp at time of call, SOB, dizziness, blurred vision, one-sided weakness              Interventions/Plan: - encounter reviewed with DOD. Per DOD patient to hold metoprolol tartrate until he is seen in office.  - Patient scheduled with DOD (dr branch) on 4/4    Reviewed ED warning signs/precautions  Patient agrees with plan, no questions at this time

## 2023-06-29 NOTE — Telephone Encounter (Signed)
 STAT if HR is under 50 or over 120 (normal HR is 60-100 beats per minute)  What is your heart rate? 40's  Do you have a log of your heart rate readings (document readings)?   Do you have any other symptoms? Had chest pain earlier

## 2023-06-30 NOTE — Telephone Encounter (Signed)
 Copied from CRM [id number]. Topic: Clinical - Prescription Issue >> Jun 30, 2023 12:46 PM Sim Boast F wrote: Reason for CRM: Orin from [facility] request call back at [phone] as to why patient is taking the [facility]. Medication was sent to the pharmacy today.

## 2023-07-01 NOTE — Progress Notes (Unsigned)
 Cardiology Office Note:    Date:  07/01/2023   ID:  Jacob [name], DOB 03-May-1965, MRN [id number]  PCP:  Corwin Levins, MD   [facility] Providers Cardiologist:  Maisie Fus, MD     Referring MD: Corwin Levins, MD   No chief complaint on file. Elevated CAC  History of Present Illness:    Jacob [name] is a 58 y.o. male with a hx of HTN, anxiety, GERD referral for CAC score  He notes chest pain in the past. He had a nuclear study in 2018 that was normal. Notes he has anxiety. Currently no significant dyspnea or chest pressure.  He is a non smoker. Family hx notable for a brother who had 2 MIs. He is a smoker. Parents have no CAD Hx.  CAC Score 74. 78th percentile  4/5/208-Nuclear MPI - EF 55%. No wall motion  09/28/2015- Normal Echo  Past Medical History:  Diagnosis Date   Allergic rhinitis    Anxiety    Arthritis    Cholesteatoma of right ear    Diabetes mellitus (HCC)    bordreline   Dysuria    Enlarged prostate    pt unaware   GERD (gastroesophageal reflux disease)    Headache    Hearing loss    Right ear   Hernia of abdominal wall    History of gross hematuria    History of head injury    History of kidney stones    History of palpitations    Hyperlipidemia 10/18/2014   Hypertension    Insomnia    Lack of concentration    Nasal fracture    Several   Otitis media 11/10/2017   Bilateral   Panic attacks    Syncope    Vertigo     Past Surgical History:  Procedure Laterality Date   CHOLECYSTECTOMY     CYSTOSCOPY N/A 05/10/2018   Procedure: CYSTOSCOPY WITH EXAM UNDER ANESTHESIA;  Surgeon: Rene Paci, MD;  Location: [facility];  Service: Urology;  Laterality: N/A;  ONLY NEEDS 30 MIN   CYSTOSCOPY N/A 07/17/2022   Procedure: CYSTOSCOPY, BILATERAL RETROGRADE PYELOGRAM;  Surgeon: Crista Elliot, MD;  Location: [facility];  Service: Urology;  Laterality: N/A;   HYPOSPADIAS CORRECTION N/A 1978   INNER  EAR SURGERY     right   LUMBAR DISC SURGERY     MASTOIDECTOMY Right    NOSE SURGERY     Fracture   tubes in bil ears     UPPER GASTROINTESTINAL ENDOSCOPY     UPPER GI ENDOSCOPY  01/09/2012   Esophageal dilation    Current Medications: No outpatient medications have been marked as taking for the 07/02/23 encounter (Appointment) with Maisie Fus, MD.     Allergies:   Patient has no known allergies.   Social History   Socioeconomic History   Marital status: Single    Spouse name: Not on file   Number of children: 3   Years of education: Not on file   Highest education level: 12th grade  Occupational History   Occupation: Sports administrator: [employer]  Tobacco Use   Smoking status: Never   Smokeless tobacco: Never  Vaping Use   Vaping status: Never Used  Substance and Sexual Activity   Alcohol use: Yes    Alcohol/week: 5.0 standard drinks of alcohol    Types: 5 Glasses of wine per week    Comment:  weekends   Drug use: No   Sexual activity: Yes    Partners: Female    Birth control/protection: None  Other Topics Concern   Not on file  Social History Narrative   Not on file   Social Drivers of Health   Financial Resource Strain: Low Risk  (11/30/2022)   Received from [facility]   Overall Financial Resource Strain (CARDIA)    Difficulty of Paying Living Expenses: Not very hard  Food Insecurity: Food Insecurity Present (11/30/2022)   Received from [facility]   Hunger Vital Sign    Worried About Running Out of Food in the Last Year: Never true    Ran Out of Food in the Last Year: Sometimes true  Transportation Needs: No Transportation Needs (11/30/2022)   Received from [facility] - Transportation    Lack of Transportation (Medical): No    Lack of Transportation (Non-Medical): No  Physical Activity: Insufficiently Active (11/30/2022)   Received from [facility]   Exercise Vital Sign    Days of Exercise per Week: 2 days    Minutes of  Exercise per Session: 20 min  Stress: No Stress Concern Present (11/30/2022)   Received from [facility] - Occupational Stress Questionnaire    Feeling of Stress : Only a little  Social Connections: Moderately Integrated (11/30/2022)   Received from [facility]   Social Network    How would you rate your social network (family, work, friends)?: Adequate participation with social networks     Family History: The patient's family history includes ADD / ADHD in his son; Anxiety disorder in his cousin, maternal aunt, maternal grandmother, maternal uncle, and mother; COPD in his mother; Cancer in his father; Diabetes in his maternal grandmother and paternal grandmother; Drug abuse in his son; Heart attack in his brother; Heart disease in his father; Hypertension in his mother; Lung cancer in his maternal uncle; OCD in his maternal aunt. There is no history of Colon cancer, Esophageal cancer, Rectal cancer, or Stomach cancer.  ROS:   Please see the history of present illness.     All other systems reviewed and are negative.  EKGs/Labs/Other Studies Reviewed:    The following studies were reviewed today:   EKG:  EKG is  ordered today.  The ekg ordered today demonstrates   08/04/2021-NSR  Recent Labs: 07/03/2022: Platelets 259.0; TSH 1.69 07/17/2022: Hemoglobin 16.7 09/16/2022: ALT 34 02/08/2023: BUN 18; Creatinine, Ser 1.00; Potassium 4.8; Sodium 137  Recent Lipid Panel    Component Value Date/Time   CHOL 151 09/16/2022 0935   TRIG 128.0 09/16/2022 0935   HDL 54.60 09/16/2022 0935   CHOLHDL 3 09/16/2022 0935   VLDL 25.6 09/16/2022 0935   LDLCALC 70 09/16/2022 0935   LDLDIRECT 102.0 11/23/2016 1553     Risk Assessment/Calculations:           Physical Exam:    VS:  There were no vitals filed for this visit.    Wt Readings from Last 3 Encounters:  03/08/23 187 lb (84.8 kg)  03/04/23 184 lb 3.2 oz (83.6 kg)  02/08/23 183 lb 6.4 oz  (83.2 kg)     GEN:  Well nourished, well developed in no acute distress HEENT: Normal NECK: No JVD; No carotid bruits LYMPHATICS: No lymphadenopathy CARDIAC: RRR, no murmurs, rubs, gallops RESPIRATORY:  Clear to auscultation without rales, wheezing or rhonchi  ABDOMEN: Soft, non-tender, non-distended MUSCULOSKELETAL:  No edema; No deformity  SKIN: Warm and dry NEUROLOGIC:  Alert and oriented x 3 PSYCHIATRIC:  Normal affect   ASSESSMENT:    CAC: His CAC score is mildly elevated , 78th percentile. He can continue on crestor.  LDL 55 mg/dL at goal. Otherwise, has no symptoms of coronary disease.  Recommend to continue with lifestyle modification and CVD risk mitigation : yearly A1c, lipid monitoring and blood pressure control.  PLAN:    In order of problems listed above:  Follow up as needed            Medication Adjustments/Labs and Tests Ordered: Current medicines are reviewed at length with the patient today.  Concerns regarding medicines are outlined above.  No orders of the defined types were placed in this encounter.  No orders of the defined types were placed in this encounter.   There are no Patient Instructions on file for this visit.   Signed, Maisie Fus, MD  07/01/2023 3:17 PM    [facility]

## 2023-07-01 NOTE — Telephone Encounter (Signed)
 I decline to answer as this is a inappropriate question without understanding why the pharmacy is asking  The script was a refill on request yesterday

## 2023-07-02 ENCOUNTER — Ambulatory Visit: Attending: Internal Medicine | Admitting: Internal Medicine

## 2023-07-02 ENCOUNTER — Encounter: Payer: Self-pay | Admitting: Internal Medicine

## 2023-07-02 VITALS — BP 126/70 | HR 67 | Ht 72.0 in | Wt 191.0 lb

## 2023-07-02 DIAGNOSIS — I10 Essential (primary) hypertension: Secondary | ICD-10-CM | POA: Diagnosis not present

## 2023-07-02 DIAGNOSIS — I2510 Atherosclerotic heart disease of native coronary artery without angina pectoris: Secondary | ICD-10-CM

## 2023-07-02 MED ORDER — METOPROLOL TARTRATE 25 MG PO TABS: 25.0000 mg | ORAL_TABLET | Freq: Two times a day (BID) | ORAL | 3 refills | Status: DC

## 2023-07-02 NOTE — Patient Instructions (Addendum)
 Medication Instructions:  INCREASE Metoprolol Tartrate (LOPRESSOR) to 25 mg twice daily (50 mg total daily) *If you need a refill on your cardiac medications before your next appointment, please call your pharmacy*  Follow-Up: At [facility], you and your health needs are our priority.  As part of our continuing mission to provide you with exceptional heart care, our providers are all part of one team.  This team includes your primary Cardiologist (physician) and Advanced Practice Providers or APPs (Physician Assistants and Nurse Practitioners) who all work together to provide you with the care you need, when you need it.  Your next appointment:   6 month(s)  Provider:   Azalee Course PA   Other Instructions    1st Floor: - Lobby - Registration  - Pharmacy  - Lab - Cafe  2nd Floor: - PV Lab - Diagnostic Testing (echo, CT, nuclear med)  3rd Floor: - Vacant  4th Floor: - TCTS (cardiothoracic surgery) - AFib Clinic - Structural Heart Clinic - Vascular Surgery  - Vascular Ultrasound  5th Floor: - HeartCare Cardiology (general and EP) - Clinical Pharmacy for coumadin, hypertension, lipid, weight-loss medications, and med management appointments    Valet parking services will be available as well.

## 2023-07-22 ENCOUNTER — Ambulatory Visit: Admitting: Physician Assistant

## 2023-07-27 ENCOUNTER — Encounter: Payer: Self-pay | Admitting: Internal Medicine

## 2023-07-27 ENCOUNTER — Ambulatory Visit: Admitting: Internal Medicine

## 2023-07-27 VITALS — BP 110/64 | HR 60 | Temp 98.1°F | Ht 72.0 in | Wt 192.0 lb

## 2023-07-27 DIAGNOSIS — R079 Chest pain, unspecified: Secondary | ICD-10-CM

## 2023-07-27 DIAGNOSIS — I10 Essential (primary) hypertension: Secondary | ICD-10-CM

## 2023-07-27 DIAGNOSIS — M2041 Other hammer toe(s) (acquired), right foot: Secondary | ICD-10-CM | POA: Insufficient documentation

## 2023-07-27 DIAGNOSIS — F418 Other specified anxiety disorders: Secondary | ICD-10-CM | POA: Diagnosis not present

## 2023-07-27 DIAGNOSIS — E0865 Diabetes mellitus due to underlying condition with hyperglycemia: Secondary | ICD-10-CM | POA: Diagnosis not present

## 2023-07-27 DIAGNOSIS — M19079 Primary osteoarthritis, unspecified ankle and foot: Secondary | ICD-10-CM | POA: Insufficient documentation

## 2023-07-27 DIAGNOSIS — M2042 Other hammer toe(s) (acquired), left foot: Secondary | ICD-10-CM | POA: Insufficient documentation

## 2023-07-27 DIAGNOSIS — E1151 Type 2 diabetes mellitus with diabetic peripheral angiopathy without gangrene: Secondary | ICD-10-CM | POA: Insufficient documentation

## 2023-07-27 DIAGNOSIS — J309 Allergic rhinitis, unspecified: Secondary | ICD-10-CM

## 2023-07-27 DIAGNOSIS — I739 Peripheral vascular disease, unspecified: Secondary | ICD-10-CM | POA: Insufficient documentation

## 2023-07-27 MED ORDER — AMLODIPINE BESYLATE 5 MG PO TABS: 5.0000 mg | ORAL_TABLET | Freq: Every day | ORAL | 3 refills | Status: DC

## 2023-07-27 MED ORDER — FLUOXETINE HCL 40 MG PO CAPS: 40.0000 mg | ORAL_CAPSULE | Freq: Every day | ORAL | 3 refills | Status: DC

## 2023-07-27 NOTE — Assessment & Plan Note (Signed)
 Lab Results  Component Value Date   HGBA1C 6.4 (A) 01/27/2023   Stable, pt to continue current medical treatment jardiance  25 every day, metformin  ER 500 mg - 4 every day, prandin  1 mg tid

## 2023-07-27 NOTE — Assessment & Plan Note (Signed)
 Low suspicion for cardiac, nov 2024 Cor CTA reviewed with pt - has mild non obstructive CAD only, cont medical tx

## 2023-07-27 NOTE — Assessment & Plan Note (Signed)
 With recent mild worsening, for increased prozac  40 mg qd

## 2023-07-27 NOTE — Assessment & Plan Note (Signed)
Mild to mod, for otc allegra and flonase asd,  to f/u any worsening symptoms or concerns

## 2023-07-27 NOTE — Assessment & Plan Note (Signed)
 Ok for change to amlodipine 5 mg every day

## 2023-07-27 NOTE — Progress Notes (Signed)
 Patient ID: Jacob [name], male   DOB: 01/20/1966, 58 y.o.   MRN: [id number]        Chief Complaint: follow up anxiety, chest pain, pedal edema, htn       HPI:  Jacob [name] is a 58 y.o. male here with c/o worsening anxiety not well controlled with prozac  20 mg asks for increase as he has taken 40 mg in the past.  Pt denies, increased sob or doe, wheezing, orthopnea, PND, increased LE swelling, palpitations, dizziness or syncope but has had some fleeting chest pains, wondering if needs stress ttest.  Also has worsening pedal edema with amlodipine 10 mg to he cuts the pills and taking 5 mg.    Pt denies polydipsia, polyuria, or new focal neuro s/s.   Does have several wks ongoing nasal allergy symptoms with clearish congestion, itch and sneezing, without fever, pain, ST, cough, swelling or wheezing.  Is likely changing to new job soon  [employer] Readings from Last 3 Encounters:  07/27/23 192 lb (87.1 kg)  07/02/23 191 lb (86.6 kg)  03/08/23 187 lb (84.8 kg)   BP Readings from Last 3 Encounters:  07/27/23 110/64  07/02/23 126/70  03/08/23 134/76         Past Medical History:  Diagnosis Date   Allergic rhinitis    Anxiety    Arthritis    Cholesteatoma of right ear    Diabetes mellitus (HCC)    bordreline   Dysuria    Enlarged prostate    pt unaware   GERD (gastroesophageal reflux disease)    Headache    Hearing loss    Right ear   Hernia of abdominal wall    History of gross hematuria    History of head injury    History of kidney stones    History of palpitations    Hyperlipidemia 10/18/2014   Hypertension    Insomnia    Lack of concentration    Nasal fracture    Several   Otitis media 11/10/2017   Bilateral   Panic attacks    Syncope    Vertigo    Past Surgical History:  Procedure Laterality Date   CHOLECYSTECTOMY     CYSTOSCOPY N/A 05/10/2018   Procedure: CYSTOSCOPY WITH EXAM UNDER ANESTHESIA;  Surgeon: Adelbert Homans, MD;  Location: [facility];  Service: Urology;  Laterality: N/A;  ONLY NEEDS 30 MIN   CYSTOSCOPY N/A 07/17/2022   Procedure: CYSTOSCOPY, BILATERAL RETROGRADE PYELOGRAM;  Surgeon: Samson Croak, MD;  Location: [facility];  Service: Urology;  Laterality: N/A;   HYPOSPADIAS CORRECTION N/A 1978   INNER EAR SURGERY     right   LUMBAR DISC SURGERY     MASTOIDECTOMY Right    NOSE SURGERY     Fracture   tubes in bil ears     UPPER GASTROINTESTINAL ENDOSCOPY     UPPER GI ENDOSCOPY  01/09/2012   Esophageal dilation    reports that he has never smoked. He has never used smokeless tobacco. He reports current alcohol use of about 5.0 standard drinks of alcohol per week. He reports that he does not use drugs. family history includes ADD / ADHD in his son; Anxiety disorder in his cousin, maternal aunt, maternal grandmother, maternal uncle, and mother; COPD in his mother; Cancer in his father; Diabetes in his maternal grandmother and paternal grandmother; Drug abuse in his son; Heart attack in his brother; Heart disease in his  father; Hypertension in his mother; Lung cancer in his maternal uncle; OCD in his maternal aunt. No Known Allergies Current Outpatient Medications on File Prior to Visit  Medication Sig Dispense Refill   ALPRAZolam  (XANAX ) 0.5 MG tablet TAKE 1 TO 2 TABLETS BY MOUTH AT BEDTIME AS NEEDED 60 tablet 5   aspirin  EC 81 MG tablet Take 1 tablet (81 mg total) by mouth daily. 90 tablet 11   clobetasol cream (TEMOVATE) 0.05 % Apply topically 2 (two) times daily.     cyanocobalamin  (VITAMIN B12) 1000 MCG tablet Take 1 tablet (1,000 mcg total) by mouth daily. 90 tablet 1   cyclobenzaprine  (FLEXERIL ) 5 MG tablet Take 5 mg by mouth at bedtime.     EQ ARTHRITIS PAIN RELIEVER 1 % GEL Apply topically 4 (four) times daily as needed.     JARDIANCE  25 MG TABS tablet TAKE 1 TABLET BY MOUTH ONCE DAILY BEFORE BREAKFAST 90 tablet 0   meclizine  (ANTIVERT ) 25 MG tablet Take 25 mg by mouth.     metFORMIN   (GLUCOPHAGE -XR) 500 MG 24 hr tablet TAKE 4 TABLETS BY MOUTH ONCE DAILY WITH BREAKFAST 360 tablet 3   metoprolol  tartrate (LOPRESSOR ) 25 MG tablet Take 1 tablet (25 mg total) by mouth 2 (two) times daily. 180 tablet 3   Multiple Vitamin (MULTIVITAMIN WITH MINERALS) TABS tablet Take 1 tablet by mouth daily.     Omega-3 Fatty Acids (FISH OIL PO) Take 1 capsule by mouth daily.     pantoprazole  (PROTONIX ) 40 MG tablet Take 1 tablet by mouth twice daily 180 tablet 3   ramipril  (ALTACE ) 10 MG capsule Take 1 capsule by mouth once daily 90 capsule 3   repaglinide  (PRANDIN ) 1 MG tablet Take 1 tablet (1 mg total) by mouth 3 (three) times daily before meals. 270 tablet 3   rosuvastatin  (CRESTOR ) 10 MG tablet Take 1 tablet by mouth once daily 90 tablet 3   tadalafil  (CIALIS ) 20 MG tablet Take 1 tablet (20 mg total) by mouth daily as needed for erectile dysfunction. 15 tablet 11   traMADol  (ULTRAM ) 50 MG tablet Take 50 mg by mouth.     ZORYVE 0.3 % CREA Apply topically.     No current facility-administered medications on file prior to visit.        ROS:  All others reviewed and negative.  Objective        PE:  BP 110/64 (BP Location: Left Arm, Patient Position: Sitting, Cuff Size: Normal)   Pulse 60   Temp 98.1 F (36.7 C) (Oral)   Ht 6' (1.829 m)   Wt 192 lb (87.1 kg)   SpO2 98%   BMI 26.04 kg/m                 Constitutional: Pt appears in NAD               HENT: Head: NCAT.                Right Ear: External ear normal.                 Left Ear: External ear normal.                Eyes: . Pupils are equal, round, and reactive to light. Conjunctivae and EOM are normal               Nose: without d/c or deformity  Neck: Neck supple. Gross normal ROM               Cardiovascular: Normal rate and regular rhythm.                 Pulmonary/Chest: Effort normal and breath sounds without rales or wheezing.                Abd:  Soft, NT, ND, + BS, no organomegaly                Neurological: Pt is alert. At baseline orientation, motor grossly intact               Skin: Skin is warm. No rashes, no other new lesions, LE edema - none               Psychiatric: Pt behavior is normal without agitation , nervous  Micro: none  Cardiac tracings I have personally interpreted today:  none  Pertinent Radiological findings (summarize): none   Lab Results  Component Value Date   WBC 5.5 07/03/2022   HGB 16.7 07/17/2022   HCT 49.0 07/17/2022   PLT 259.0 07/03/2022   GLUCOSE 218 (H) 02/08/2023   CHOL 151 09/16/2022   TRIG 128.0 09/16/2022   HDL 54.60 09/16/2022   LDLDIRECT 102.0 11/23/2016   LDLCALC 70 09/16/2022   ALT 34 09/16/2022   AST 24 09/16/2022   NA 137 02/08/2023   K 4.8 02/08/2023   CL 100 02/08/2023   CREATININE 1.00 02/08/2023   BUN 18 02/08/2023   CO2 21 02/08/2023   TSH 1.69 07/03/2022   PSA 2.06 09/16/2022   INR 0.96 12/11/2017   HGBA1C 6.4 (A) 01/27/2023   MICROALBUR <0.7 09/16/2022   Assessment/Plan:  Jacob [name] is a 58 y.o. White or Caucasian [1] male with  has a past medical history of Allergic rhinitis, Anxiety, Arthritis, Cholesteatoma of right ear, Diabetes mellitus (HCC), Dysuria, Enlarged prostate, GERD (gastroesophageal reflux disease), Headache, Hearing loss, Hernia of abdominal wall, History of gross hematuria, History of head injury, History of kidney stones, History of palpitations, Hyperlipidemia (10/18/2014), Hypertension, Insomnia, Lack of concentration, Nasal fracture, Otitis media (11/10/2017), Panic attacks, Syncope, and Vertigo.  Depression with anxiety With recent mild worsening, for increased prozac  40 mg qd  Essential hypertension, benign Ok for change to amlodipine 5 mg every day  Diabetes mellitus due to underlying condition with hyperglycemia, without long-term current use of insulin  (HCC) Lab Results  Component Value Date   HGBA1C 6.4 (A) 01/27/2023   Stable, pt to continue current medical treatment jardiance   25 every day, metformin  ER 500 mg - 4 every day, prandin  1 mg tid    Chest pain Low suspicion for cardiac, nov 2024 Cor CTA reviewed with pt - has mild non obstructive CAD only, cont medical tx  Allergic rhinitis Mild to mod, for otc allegra  and flonase  asd,  to f/u any worsening symptoms or concerns  Followup: Return in about 6 months (around 01/26/2024).  Rosalia Colonel, MD 07/27/2023 8:19 PM [facility] - [facility] Internal Medicine

## 2023-07-27 NOTE — Patient Instructions (Signed)
 Ok to decrease the amlodipine to 5 mg per day  Ok to increase the prozac  to 40 mg per day  Ok to use the OTC flonase  and allegra  for allergies  Please continue all other medications as before, and refills have been done if requested.  Please have the pharmacy call with any other refills you may need.  Please continue your efforts at being more active, low cholesterol diet, and weight control.  Please keep your appointments with your specialists as you may have planned  Good Luck with your new job!

## 2023-08-29 ENCOUNTER — Other Ambulatory Visit: Payer: Self-pay | Admitting: Internal Medicine

## 2023-10-02 ENCOUNTER — Other Ambulatory Visit: Payer: Self-pay | Admitting: Internal Medicine

## 2023-10-04 ENCOUNTER — Ambulatory Visit: Payer: Self-pay

## 2023-10-04 NOTE — Telephone Encounter (Signed)
 FYI for provider Last office visit: 07/27/2023 Symptoms began: one week ago Interventions: none No change in symptoms Appointment scheduled    Copied from CRM [id number]. Topic: Clinical - Red Word Triage >> Oct 04, 2023  3:57 PM [place] E wrote: [facility] that prompted transfer to Nurse Triage: Abdomen pain. Patient stated he has been throwing up for 1.5 weeks, with stomach pain in the morning. Also having bad acid reflux. Rated pain 6-7 out of 10. Reason for Disposition  [1] MODERATE pain (e.g., interferes with normal activities) AND [2] pain comes and goes (cramps) AND [3] present > 24 hours  (Exception: Pain with Vomiting or Diarrhea - see that Guideline.)  Answer Assessment - Initial Assessment Questions 1. LOCATION: Where does it hurt?      Middle of abdomen 2. RADIATION: Does the pain shoot anywhere else? (e.g., chest, back)     denies 3. ONSET: When did the pain begin? (Minutes, hours or days ago)      Pain began about a week ago     Patient states that he was taking a lot of ibuprofen  for his tooth, had dental work done last Wednesday and is not using ibuprofen  any more States that he is nauseated all day long, and vomits almost every morning, reports that reflux is flared up 5. PATTERN Does the pain come and go, or is it constant?    - If it comes and goes: How long does it last? Do you have pain now?     (Note: Comes and goes means the pain is intermittent. It goes away completely between bouts.)    - If constant: Is it getting better, staying the same, or getting worse?      (Note: Constant means the pain never goes away completely; most serious pain is constant and gets worse.)      Pain comes and goes, but patient stays nauseated 6. SEVERITY: How bad is the pain?  (e.g., Scale 1-10; mild, moderate, or severe)    - MILD (1-3): Doesn't interfere with normal activities, abdomen soft and not tender to touch.     - MODERATE (4-7): Interferes with normal  activities or awakens from sleep, abdomen tender to touch.     - SEVERE (8-10): Excruciating pain, doubled over, unable to do any normal activities.       Does not feel well, but he is able to work  8. CAUSE: What do you think is causing the stomach pain?     Unsure, but possibly ibuprofen   10. OTHER SYMPTOMS: Do you have any other symptoms? (e.g., back pain, diarrhea, fever, urination pain, vomiting)     Also has issues with diarrhea/constipation  Protocols used: Abdominal Pain - Male-A-AH

## 2023-10-05 ENCOUNTER — Ambulatory Visit: Admitting: Internal Medicine

## 2023-10-06 ENCOUNTER — Telehealth: Payer: Self-pay | Admitting: Internal Medicine

## 2023-10-06 ENCOUNTER — Ambulatory Visit: Admitting: Internal Medicine

## 2023-10-06 ENCOUNTER — Encounter: Payer: Self-pay | Admitting: Internal Medicine

## 2023-10-06 VITALS — BP 118/76 | HR 64 | Temp 98.2°F | Ht 72.0 in | Wt 167.8 lb

## 2023-10-06 DIAGNOSIS — E119 Type 2 diabetes mellitus without complications: Secondary | ICD-10-CM | POA: Diagnosis not present

## 2023-10-06 DIAGNOSIS — R300 Dysuria: Secondary | ICD-10-CM

## 2023-10-06 DIAGNOSIS — E559 Vitamin D deficiency, unspecified: Secondary | ICD-10-CM

## 2023-10-06 DIAGNOSIS — K2900 Acute gastritis without bleeding: Secondary | ICD-10-CM

## 2023-10-06 DIAGNOSIS — Z125 Encounter for screening for malignant neoplasm of prostate: Secondary | ICD-10-CM | POA: Diagnosis not present

## 2023-10-06 DIAGNOSIS — K2970 Gastritis, unspecified, without bleeding: Secondary | ICD-10-CM | POA: Insufficient documentation

## 2023-10-06 DIAGNOSIS — I10 Essential (primary) hypertension: Secondary | ICD-10-CM

## 2023-10-06 DIAGNOSIS — E538 Deficiency of other specified B group vitamins: Secondary | ICD-10-CM

## 2023-10-06 DIAGNOSIS — E0865 Diabetes mellitus due to underlying condition with hyperglycemia: Secondary | ICD-10-CM

## 2023-10-06 MED ORDER — AMLODIPINE BESYLATE 5 MG PO TABS
5.0000 mg | ORAL_TABLET | Freq: Every day | ORAL | 3 refills | Status: AC
Start: 2023-10-06 — End: 2024-10-05

## 2023-10-06 MED ORDER — RAMIPRIL 10 MG PO CAPS: 10.0000 mg | ORAL_CAPSULE | Freq: Every day | ORAL | 0 refills | Status: DC

## 2023-10-06 MED ORDER — REPAGLINIDE 1 MG PO TABS: 1.0000 mg | ORAL_TABLET | Freq: Three times a day (TID) | ORAL | 3 refills | Status: DC

## 2023-10-06 MED ORDER — EMPAGLIFLOZIN 25 MG PO TABS: 25.0000 mg | ORAL_TABLET | Freq: Every day | ORAL | 0 refills | Status: DC

## 2023-10-06 MED ORDER — METOPROLOL TARTRATE 25 MG PO TABS: 25.0000 mg | ORAL_TABLET | Freq: Two times a day (BID) | ORAL | 3 refills | Status: AC

## 2023-10-06 MED ORDER — ROSUVASTATIN CALCIUM 10 MG PO TABS: 10.0000 mg | ORAL_TABLET | Freq: Every day | ORAL | 3 refills | Status: AC

## 2023-10-06 MED ORDER — PANTOPRAZOLE SODIUM 40 MG PO TBEC: 40.0000 mg | DELAYED_RELEASE_TABLET | Freq: Two times a day (BID) | ORAL | 3 refills | Status: AC

## 2023-10-06 MED ORDER — FLUOXETINE HCL 40 MG PO CAPS: 40.0000 mg | ORAL_CAPSULE | Freq: Every day | ORAL | 3 refills | Status: AC

## 2023-10-06 MED ORDER — SUCRALFATE 1 G PO TABS: 1.0000 g | ORAL_TABLET | Freq: Three times a day (TID) | ORAL | 0 refills | Status: AC

## 2023-10-06 MED ORDER — METFORMIN HCL ER 500 MG PO TB24: ORAL_TABLET | ORAL | 3 refills | Status: AC

## 2023-10-06 NOTE — Patient Instructions (Addendum)
 Please have your Shingrix (shingles) shots done at your local pharmacy, and the Tdap tetanus as well  Please take all new medication as prescribed  - the carafate   Please continue all other medications as before, and refills have been done if requested.  Please have the pharmacy call with any other refills you may need.  Please continue your efforts at being more active, low cholesterol diet, and weight control.  Please keep your appointments with your specialists as you may have planned  Please go to the LAB at the blood drawing area for the tests to be done  - just the urine testing today  You will be contacted by phone if any changes need to be made immediately.  Otherwise, you will receive a letter about your results with an explanation, but please check with MyChart first.  Please make an Appointment to return in next week, or sooner if needed, also with Lab Appointment for testing done 3-5 days before at the FIRST FLOOR Lab (so this is for TWO appointments - please see the scheduling desk as you leave)

## 2023-10-06 NOTE — Assessment & Plan Note (Signed)
Also for ua and culture, tx pending results

## 2023-10-06 NOTE — Telephone Encounter (Signed)
 Patient has physical scheduled for 10/12/23. He would like to know if he can get blood work done ahead of time. Best callback is [phone].

## 2023-10-06 NOTE — Progress Notes (Signed)
 Patient ID: Jacob [name], male   DOB: Dec 20, 1965, 58 y.o.   MRN: [id number]           Chief Complaint:: Nausea (Nausea and vomiting for the past week, abdominal pain (mostly in the morning) and GERD (currently taking 2 pantoprazole ). Patient believes this happened post removal of a tooth (prescribed azithromycin  after). Also having burning when urinating)  , dm, low b12, htn       HPI:  Jacob [name] is a 58 y.o. male here  now working third shift but has been losing wt with the heat.  Pt denies chest pain, increased sob or doe, wheezing, orthopnea, PND, increased LE swelling, palpitations, dizziness or syncope.   Pt denies polydipsia, polyuria, or new focal neuro s/s.    Pt denies fever, wt loss, night sweats, loss of appetite, or other constitutional symptoms  Has also had mild urinary symptoms such as dysuria, frequency, urgency, but no flank pain, hematuria or n/v, fever, chills     Wt Readings from Last 3 Encounters:  10/06/23 167 lb 12.8 oz (76.1 kg)  07/27/23 192 lb (87.1 kg)  07/02/23 191 lb (86.6 kg)   BP Readings from Last 3 Encounters:  10/06/23 118/76  07/27/23 110/64  07/02/23 126/70   Immunization History  Administered Date(s) Administered   Influenza Inj Mdck Quad Pf 12/24/2018   Influenza, Seasonal, Injecte, Preservative Fre 01/27/2023   Influenza,inj,Quad PF,6+ Mos 01/11/2013, 11/19/2016, 11/26/2017, 01/22/2020   Influenza-Unspecified 12/29/2015, 01/15/2021, 12/10/2021   Janssen (J&J) SARS-COV-2 Vaccination 10/04/2019   PFIZER(Purple Top)SARS-COV-2 Vaccination 03/09/2020   Pneumococcal Polysaccharide-23 01/11/2013   Tdap 06/21/2001, 09/22/2012   Health Maintenance Due  Topic Date Due   Diabetic kidney evaluation - Urine ACR  Never done   Hepatitis B Vaccines (1 of 3 - 19+ 3-dose series) Never done   Zoster Vaccines- Shingrix (1 of 2) Never done   Pneumococcal Vaccine 80-63 Years old (2 of 2 - PCV) 01/11/2014   OPHTHALMOLOGY EXAM  07/29/2022   DTaP/Tdap/Td (3  - Td or Tdap) 09/23/2022   HEMOGLOBIN A1C  07/28/2023   Colonoscopy  01/21/2024      Past Medical History:  Diagnosis Date   Allergic rhinitis    Anxiety    Arthritis    Cholesteatoma of right ear    Diabetes mellitus (HCC)    bordreline   Dysuria    Enlarged prostate    pt unaware   GERD (gastroesophageal reflux disease)    Headache    Hearing loss    Right ear   Hernia of abdominal wall    History of gross hematuria    History of head injury    History of kidney stones    History of palpitations    Hyperlipidemia 10/18/2014   Hypertension    Insomnia    Lack of concentration    Nasal fracture    Several   Otitis media 11/10/2017   Bilateral   Panic attacks    Syncope    Vertigo    Past Surgical History:  Procedure Laterality Date   CHOLECYSTECTOMY     CYSTOSCOPY N/A 05/10/2018   Procedure: CYSTOSCOPY WITH EXAM UNDER ANESTHESIA;  Surgeon: Devere Lonni Righter, MD;  Location: [facility];  Service: Urology;  Laterality: N/A;  ONLY NEEDS 30 MIN   CYSTOSCOPY N/A 07/17/2022   Procedure: CYSTOSCOPY, BILATERAL RETROGRADE PYELOGRAM;  Surgeon: Carolee Sherwood JONETTA DOUGLAS, MD;  Location: [facility];  Service: Urology;  Laterality: N/A;  HYPOSPADIAS CORRECTION N/A 1978   INNER EAR SURGERY     right   LUMBAR DISC SURGERY     MASTOIDECTOMY Right    NOSE SURGERY     Fracture   tubes in bil ears     UPPER GASTROINTESTINAL ENDOSCOPY     UPPER GI ENDOSCOPY  01/09/2012   Esophageal dilation    reports that he has never smoked. He has never used smokeless tobacco. He reports current alcohol use of about 5.0 standard drinks of alcohol per week. He reports that he does not use drugs. family history includes ADD / ADHD in his son; Anxiety disorder in his cousin, maternal aunt, maternal grandmother, maternal uncle, and mother; COPD in his mother; Cancer in his father; Diabetes in his maternal grandmother and paternal grandmother; Drug abuse in his son;  Heart attack in his brother; Heart disease in his father; Hypertension in his mother; Lung cancer in his maternal uncle; OCD in his maternal aunt. No Known Allergies Current Outpatient Medications on File Prior to Visit  Medication Sig Dispense Refill   ALPRAZolam  (XANAX ) 0.5 MG tablet TAKE 1 TO 2 TABLETS BY MOUTH AT BEDTIME AS NEEDED 60 tablet 5   aspirin  EC 81 MG tablet Take 1 tablet (81 mg total) by mouth daily. 90 tablet 11   clobetasol cream (TEMOVATE) 0.05 % Apply topically 2 (two) times daily.     cyanocobalamin  (VITAMIN B12) 1000 MCG tablet Take 1 tablet (1,000 mcg total) by mouth daily. 90 tablet 1   cyclobenzaprine  (FLEXERIL ) 5 MG tablet Take 5 mg by mouth at bedtime.     EQ ARTHRITIS PAIN RELIEVER 1 % GEL Apply topically 4 (four) times daily as needed.     meclizine  (ANTIVERT ) 25 MG tablet Take 25 mg by mouth.     Multiple Vitamin (MULTIVITAMIN WITH MINERALS) TABS tablet Take 1 tablet by mouth daily.     Omega-3 Fatty Acids (FISH OIL PO) Take 1 capsule by mouth daily.     tadalafil  (CIALIS ) 20 MG tablet Take 1 tablet (20 mg total) by mouth daily as needed for erectile dysfunction. 15 tablet 11   traMADol  (ULTRAM ) 50 MG tablet Take 50 mg by mouth.     ZORYVE 0.3 % CREA Apply topically.     No current facility-administered medications on file prior to visit.        ROS:  All others reviewed and negative.  Objective        PE:  BP 118/76   Pulse 64   Temp 98.2 F (36.8 C)   Ht 6' (1.829 m)   Wt 167 lb 12.8 oz (76.1 kg)   SpO2 99%   BMI 22.76 kg/m                 Constitutional: Pt appears in NAD               HENT: Head: NCAT.                Right Ear: External ear normal.                 Left Ear: External ear normal.                Eyes: . Pupils are equal, round, and reactive to light. Conjunctivae and EOM are normal               Nose: without d/c or deformity  Neck: Neck supple. Gross normal ROM               Cardiovascular: Normal rate and regular  rhythm.                 Pulmonary/Chest: Effort normal and breath sounds without rales or wheezing.                Abd:  Soft, NT, ND, + BS, no organomegaly               Neurological: Pt is alert. At baseline orientation, motor grossly intact               Skin: Skin is warm. No rashes, no other new lesions, LE edema - none               Psychiatric: Pt behavior is normal without agitation   Micro: none  Cardiac tracings I have personally interpreted today:  none  Pertinent Radiological findings (summarize): none   Lab Results  Component Value Date   WBC 5.5 07/03/2022   HGB 16.7 07/17/2022   HCT 49.0 07/17/2022   PLT 259.0 07/03/2022   GLUCOSE 218 (H) 02/08/2023   CHOL 151 09/16/2022   TRIG 128.0 09/16/2022   HDL 54.60 09/16/2022   LDLDIRECT 102.0 11/23/2016   LDLCALC 70 09/16/2022   ALT 34 09/16/2022   AST 24 09/16/2022   NA 137 02/08/2023   K 4.8 02/08/2023   CL 100 02/08/2023   CREATININE 1.00 02/08/2023   BUN 18 02/08/2023   CO2 21 02/08/2023   TSH 1.69 07/03/2022   PSA 2.06 09/16/2022   INR 0.96 12/11/2017   HGBA1C 6.4 (A) 01/27/2023   Assessment/Plan:  Jacob [name] is a 58 y.o. White or Caucasian [1] male with  has a past medical history of Allergic rhinitis, Anxiety, Arthritis, Cholesteatoma of right ear, Diabetes mellitus (HCC), Dysuria, Enlarged prostate, GERD (gastroesophageal reflux disease), Headache, Hearing loss, Hernia of abdominal wall, History of gross hematuria, History of head injury, History of kidney stones, History of palpitations, Hyperlipidemia (10/18/2014), Hypertension, Insomnia, Lack of concentration, Nasal fracture, Otitis media (11/10/2017), Panic attacks, Syncope, and Vertigo.  Essential hypertension, benign BP Readings from Last 3 Encounters:  10/06/23 118/76  07/27/23 110/64  07/02/23 126/70   Stable, pt to continue medical treatment norvasc  5 every day, altace  10 every day, lopressor  25 bid   Diabetes mellitus due to underlying  condition with hyperglycemia, without long-term current use of insulin  (HCC) Lab Results  Component Value Date   HGBA1C 6.4 (A) 01/27/2023   Stable, pt to continue current medical treatment  - diet, wt control  B12 deficiency Lab Results  Component Value Date   VITAMINB12 147 (L) 09/16/2022   Low, to start oral replacement - b12 1000 mcg qd   Dysuria Also for ua and culture, tx pending results  Gastritis Mild to mod, etiology unclear, for carafate  1 qid x 1 mo,  to f/u any worsening symptoms or concerns  Followup: Return if symptoms worsen or fail to improve.  Lynwood Rush, MD 10/06/2023 7:43 PM [facility] [place] [facility] - [facility] Internal Medicine

## 2023-10-06 NOTE — Assessment & Plan Note (Signed)
 BP Readings from Last 3 Encounters:  10/06/23 118/76  07/27/23 110/64  07/02/23 126/70   Stable, pt to continue medical treatment norvasc  5 every day, altace  10 every day, lopressor  25 bid

## 2023-10-06 NOTE — Assessment & Plan Note (Signed)
 Mild to mod, etiology unclear, for carafate  1 qid x 1 mo,  to f/u any worsening symptoms or concerns

## 2023-10-06 NOTE — Assessment & Plan Note (Signed)
 Lab Results  Component Value Date   HGBA1C 6.4 (A) 01/27/2023   Stable, pt to continue current medical treatment  - diet, wt control

## 2023-10-06 NOTE — Assessment & Plan Note (Signed)
 Lab Results  Component Value Date   VITAMINB12 147 (L) 09/16/2022   Low, to start oral replacement - b12 1000 mcg qd

## 2023-10-07 ENCOUNTER — Ambulatory Visit: Payer: Self-pay | Admitting: Internal Medicine

## 2023-10-07 LAB — URINALYSIS, ROUTINE W REFLEX MICROSCOPIC
Bilirubin Urine: NEGATIVE
Hgb urine dipstick: NEGATIVE
Ketones, ur: NEGATIVE
Leukocytes,Ua: NEGATIVE
Nitrite: NEGATIVE
Specific Gravity, Urine: <=1.005 — AB (ref 1.000–1.030)
Total Protein, Urine: NEGATIVE
Urine Glucose: >=1000 — AB
Urobilinogen, UA: 0.2 (ref 0.0–1.0)
pH: 6 (ref 5.0–8.0)

## 2023-10-07 LAB — URINE CULTURE

## 2023-10-07 NOTE — Telephone Encounter (Signed)
 Addressed with patient during acute visit with Dr.John yesterday

## 2023-10-12 ENCOUNTER — Encounter: Admitting: Internal Medicine

## 2023-10-13 ENCOUNTER — Encounter: Admitting: Internal Medicine

## 2023-10-18 ENCOUNTER — Other Ambulatory Visit (INDEPENDENT_AMBULATORY_CARE_PROVIDER_SITE_OTHER)

## 2023-10-18 DIAGNOSIS — Z125 Encounter for screening for malignant neoplasm of prostate: Secondary | ICD-10-CM | POA: Diagnosis not present

## 2023-10-18 DIAGNOSIS — E559 Vitamin D deficiency, unspecified: Secondary | ICD-10-CM

## 2023-10-18 DIAGNOSIS — E538 Deficiency of other specified B group vitamins: Secondary | ICD-10-CM | POA: Diagnosis not present

## 2023-10-18 DIAGNOSIS — E0865 Diabetes mellitus due to underlying condition with hyperglycemia: Secondary | ICD-10-CM

## 2023-10-18 LAB — HEPATIC FUNCTION PANEL
ALT: 28 U/L (ref 0–53)
AST: 21 U/L (ref 0–37)
Albumin: 4.6 g/dL (ref 3.5–5.2)
Alkaline Phosphatase: 74 U/L (ref 39–117)
Bilirubin, Direct: 0.1 mg/dL (ref 0.0–0.3)
Total Bilirubin: 0.6 mg/dL (ref 0.2–1.2)
Total Protein: 7.2 g/dL (ref 6.0–8.3)

## 2023-10-18 LAB — CBC WITH DIFFERENTIAL/PLATELET
Basophils Absolute: 0 K/uL (ref 0.0–0.1)
Basophils Relative: 0.4 % (ref 0.0–3.0)
Eosinophils Absolute: 0.1 K/uL (ref 0.0–0.7)
Eosinophils Relative: 1.1 % (ref 0.0–5.0)
HCT: 45.4 % (ref 39.0–52.0)
Hemoglobin: 15.3 g/dL (ref 13.0–17.0)
Lymphocytes Relative: 33.1 % (ref 12.0–46.0)
Lymphs Abs: 1.6 K/uL (ref 0.7–4.0)
MCHC: 33.6 g/dL (ref 30.0–36.0)
MCV: 90.1 fl (ref 78.0–100.0)
Monocytes Absolute: 0.3 K/uL (ref 0.1–1.0)
Monocytes Relative: 6.9 % (ref 3.0–12.0)
Neutro Abs: 2.8 K/uL (ref 1.4–7.7)
Neutrophils Relative %: 58.5 % (ref 43.0–77.0)
Platelets: 290 K/uL (ref 150.0–400.0)
RBC: 5.04 Mil/uL (ref 4.22–5.81)
RDW: 13.5 % (ref 11.5–15.5)
WBC: 4.7 K/uL (ref 4.0–10.5)

## 2023-10-18 LAB — BASIC METABOLIC PANEL WITH GFR
BUN: 12 mg/dL (ref 6–23)
CO2: 25 meq/L (ref 19–32)
Calcium: 9.6 mg/dL (ref 8.4–10.5)
Chloride: 103 meq/L (ref 96–112)
Creatinine, Ser: 0.94 mg/dL (ref 0.40–1.50)
GFR: 90.58 mL/min (ref >60.00)
Glucose, Bld: 81 mg/dL (ref 70–99)
Potassium: 5 meq/L (ref 3.5–5.1)
Sodium: 141 meq/L (ref 135–145)

## 2023-10-18 LAB — MICROALBUMIN / CREATININE URINE RATIO
Creatinine,U: 84 mg/dL
Microalb Creat Ratio: 17.8 mg/g (ref 0.0–30.0)
Microalb, Ur: 1.5 mg/dL (ref 0.0–1.9)

## 2023-10-18 LAB — PSA: PSA: 2.55 ng/mL (ref 0.10–4.00)

## 2023-10-18 LAB — LIPID PANEL
Cholesterol: 133 mg/dL (ref 0–200)
HDL: 60.2 mg/dL (ref >39.00)
LDL Cholesterol: 55 mg/dL (ref 0–99)
NonHDL: 73.12
Total CHOL/HDL Ratio: 2
Triglycerides: 92 mg/dL (ref 0.0–149.0)
VLDL: 18.4 mg/dL (ref 0.0–40.0)

## 2023-10-18 LAB — HEMOGLOBIN A1C: Hgb A1c MFr Bld: 7.1 % — ABNORMAL HIGH (ref 4.6–6.5)

## 2023-10-18 LAB — VITAMIN D 25 HYDROXY (VIT D DEFICIENCY, FRACTURES): VITD: 30.38 ng/mL (ref 30.00–100.00)

## 2023-10-18 LAB — TSH: TSH: 1.08 u[IU]/mL (ref 0.35–5.50)

## 2023-10-18 LAB — VITAMIN B12: Vitamin B-12: 293 pg/mL (ref 211–911)

## 2023-10-18 NOTE — Progress Notes (Signed)
 The test results show that your current treatment is OK, as the tests are stable.  Please continue the same plan.  There is no other need for change of treatment or further evaluation based on these results, at this time.  thanks

## 2023-10-19 ENCOUNTER — Encounter: Admitting: Internal Medicine

## 2023-10-26 ENCOUNTER — Encounter: Payer: Self-pay | Admitting: Internal Medicine

## 2023-10-26 ENCOUNTER — Other Ambulatory Visit: Payer: Self-pay

## 2023-10-26 ENCOUNTER — Ambulatory Visit (INDEPENDENT_AMBULATORY_CARE_PROVIDER_SITE_OTHER): Admitting: Internal Medicine

## 2023-10-26 VITALS — BP 132/74 | HR 58 | Temp 98.6°F | Ht 72.0 in | Wt 172.6 lb

## 2023-10-26 DIAGNOSIS — Z125 Encounter for screening for malignant neoplasm of prostate: Secondary | ICD-10-CM | POA: Diagnosis not present

## 2023-10-26 DIAGNOSIS — Z0001 Encounter for general adult medical examination with abnormal findings: Secondary | ICD-10-CM

## 2023-10-26 DIAGNOSIS — E559 Vitamin D deficiency, unspecified: Secondary | ICD-10-CM

## 2023-10-26 DIAGNOSIS — Z0000 Encounter for general adult medical examination without abnormal findings: Secondary | ICD-10-CM

## 2023-10-26 DIAGNOSIS — M7062 Trochanteric bursitis, left hip: Secondary | ICD-10-CM

## 2023-10-26 DIAGNOSIS — E538 Deficiency of other specified B group vitamins: Secondary | ICD-10-CM

## 2023-10-26 DIAGNOSIS — E7800 Pure hypercholesterolemia, unspecified: Secondary | ICD-10-CM

## 2023-10-26 DIAGNOSIS — I10 Essential (primary) hypertension: Secondary | ICD-10-CM

## 2023-10-26 DIAGNOSIS — M7072 Other bursitis of hip, left hip: Secondary | ICD-10-CM | POA: Insufficient documentation

## 2023-10-26 DIAGNOSIS — E0865 Diabetes mellitus due to underlying condition with hyperglycemia: Secondary | ICD-10-CM | POA: Diagnosis not present

## 2023-10-26 MED ORDER — MELOXICAM 15 MG PO TABS: 15.0000 mg | ORAL_TABLET | Freq: Every day | ORAL | 1 refills | Status: AC | PRN

## 2023-10-26 MED ORDER — REPAGLINIDE 0.5 MG PO TABS: 0.5000 mg | ORAL_TABLET | Freq: Two times a day (BID) | ORAL | 3 refills | Status: AC

## 2023-10-26 MED ORDER — MELOXICAM 15 MG PO TABS: 15.0000 mg | ORAL_TABLET | Freq: Every day | ORAL | 1 refills | Status: DC | PRN

## 2023-10-26 MED ORDER — REPAGLINIDE 0.5 MG PO TABS: 0.5000 mg | ORAL_TABLET | Freq: Two times a day (BID) | ORAL | 3 refills | Status: DC

## 2023-10-26 NOTE — Assessment & Plan Note (Signed)
 Mild to mod, for mobic  15 mg every day prn, consider referral to sport med,  to f/u any worsening symptoms or concerns

## 2023-10-26 NOTE — Addendum Note (Signed)
 Addended by: NORLEEN LYNWOOD ORN on: 10/26/2023 08:58 PM   Modules accepted: Level of Service

## 2023-10-26 NOTE — Patient Instructions (Addendum)
 Ok to decrease the prandin  to 0.5 mg twice per day  Please take all new medication as prescribed- the meloxicam  for pain  Please continue all other medications as before, and refills have been done if requested.  Please have the pharmacy call with any other refills you may need.  Please continue your efforts at being more active, low cholesterol diet, and weight control.  You are otherwise up to date with prevention measures today.; remember to call for your yearly eye exam  Please keep your appointments with your specialists as you may have planned  Please see Sports Medicine on the first floor if the left hip pain does not improve  Your lab testing was good!  Please make an Appointment to return in 6 months, or sooner if needed, also with Lab Appointment for testing done 3-5 days before at the FIRST FLOOR Lab (so this is for TWO appointments - please see the scheduling desk as you leave)

## 2023-10-26 NOTE — Telephone Encounter (Signed)
 Good afternoon!  Patient is requiring that the meloxicam  be sent to the [employer] [facility] on file

## 2023-10-26 NOTE — Addendum Note (Signed)
 Addended by: NORLEEN LYNWOOD ORN on: 10/26/2023 08:58 PM   Modules accepted: Orders

## 2023-10-26 NOTE — Assessment & Plan Note (Signed)
 BP Readings from Last 3 Encounters:  10/26/23 132/74  10/06/23 118/76  07/27/23 110/64   Stable, pt to continue medical treatment norvasc  5 every day, altace  10 every day, lopressor  25 bid

## 2023-10-26 NOTE — Assessment & Plan Note (Signed)
 Age and sex appropriate education and counseling updated with regular exercise and diet Referrals for preventative services - for colonoscopy oct 2025, pt to call for eye exam soon Immunizations addressed - none needed Smoking counseling  - none needed Evidence for depression or other mood disorder - none significant Most recent labs reviewed. I have personally reviewed and have noted: 1) the patient's medical and social history 2) The patient's current medications and supplements 3) The patient's height, weight, and BMI have been recorded in the chart

## 2023-10-26 NOTE — Assessment & Plan Note (Signed)
 Lab Results  Component Value Date   VITAMINB12 293 10/18/2023   Low, to start oral replacement - b12 1000 mcg qd

## 2023-10-26 NOTE — Telephone Encounter (Signed)
 Ok this is done

## 2023-10-26 NOTE — Assessment & Plan Note (Signed)
 Lab Results  Component Value Date   LDLCALC 55 10/18/2023   Stable, pt to continue current statin crestor  10 mg qd

## 2023-10-26 NOTE — Progress Notes (Signed)
 Patient ID: Jacob [name], male   DOB: 1965/06/18, 58 y.o.   MRN: [id number]         Chief Complaint:: wellness exam and Annual Exam (Annual Exam. Labs 10/18/23. Discuss recent lab results, concerns with A1C. Repaglinide  is causing hypoglycemia)  , left hip bursitis, dm, htn, hld, low b12       HPI:  [name] is a 58 y.o. male here for wellness exam; due for colonoscopy oct 2025; plans to call for eye exam soon,                         Also working third shift.  Ex wife with met colon cancer.  Has sore tenderness over left great trochanter for several wks.  Pt denies chest pain, increased sob or doe, wheezing, orthopnea, PND, increased LE swelling, palpitations, dizziness or syncope.   Pt denies polydipsia, polyuria, or new focal neuro s/s.    Pt denies fever, wt loss, night sweats, loss of appetite, or other constitutional symptoms     Wt Readings from Last 3 Encounters:  10/26/23 172 lb 9.6 oz (78.3 kg)  10/06/23 167 lb 12.8 oz (76.1 kg)  07/27/23 192 lb (87.1 kg)   BP Readings from Last 3 Encounters:  10/26/23 132/74  10/06/23 118/76  07/27/23 110/64   Immunization History  Administered Date(s) Administered   Influenza Inj Mdck Quad Pf 12/24/2018   Influenza, Seasonal, Injecte, Preservative Fre 01/27/2023   Influenza,inj,Quad PF,6+ Mos 01/11/2013, 11/19/2016, 11/26/2017, 01/22/2020   Influenza-Unspecified 12/29/2015, 01/15/2021, 12/10/2021   Janssen (J&J) SARS-COV-2 Vaccination 10/04/2019   PFIZER(Purple Top)SARS-COV-2 Vaccination 03/09/2020   PNEUMOCOCCAL CONJUGATE-20 10/04/2023   Pneumococcal Polysaccharide-23 01/11/2013   Tdap 06/21/2001, 09/22/2012   Health Maintenance Due  Topic Date Due   Hepatitis B Vaccines (1 of 3 - 19+ 3-dose series) Never done   Zoster Vaccines- Shingrix (1 of 2) Never done   OPHTHALMOLOGY EXAM  07/29/2022   DTaP/Tdap/Td (3 - Td or Tdap) 09/23/2022   Colonoscopy  01/21/2024      Past Medical History:  Diagnosis Date   Allergic rhinitis     Anxiety    Arthritis    Cholesteatoma of right ear    Diabetes mellitus (HCC)    bordreline   Dysuria    Enlarged prostate    pt unaware   GERD (gastroesophageal reflux disease)    Headache    Hearing loss    Right ear   Hernia of abdominal wall    History of gross hematuria    History of head injury    History of kidney stones    History of palpitations    Hyperlipidemia 10/18/2014   Hypertension    Insomnia    Lack of concentration    Nasal fracture    Several   Otitis media 11/10/2017   Bilateral   Panic attacks    Syncope    Vertigo    Past Surgical History:  Procedure Laterality Date   CHOLECYSTECTOMY     CYSTOSCOPY N/A 05/10/2018   Procedure: CYSTOSCOPY WITH EXAM UNDER ANESTHESIA;  Surgeon: Devere Lonni Righter, MD;  [facility];  Service: Urology;  Laterality: N/A;  ONLY NEEDS 30 MIN   CYSTOSCOPY N/A 07/17/2022   Procedure: CYSTOSCOPY, BILATERAL RETROGRADE PYELOGRAM;  Surgeon: Carolee Sherwood JONETTA DOUGLAS, MD;  Location: [facility];  Service: Urology;  Laterality: N/A;   HYPOSPADIAS CORRECTION N/A 1978   INNER EAR SURGERY  right   LUMBAR DISC SURGERY     MASTOIDECTOMY Right    NOSE SURGERY     Fracture   tubes in bil ears     UPPER GASTROINTESTINAL ENDOSCOPY     UPPER GI ENDOSCOPY  01/09/2012   Esophageal dilation    reports that he has never smoked. He has never used smokeless tobacco. He reports current alcohol use of about 5.0 standard drinks of alcohol per week. He reports that he does not use drugs. family history includes ADD / ADHD in his son; Anxiety disorder in his cousin, maternal aunt, maternal grandmother, maternal uncle, and mother; COPD in his mother; Cancer in his father; Diabetes in his maternal grandmother and paternal grandmother; Drug abuse in his son; Heart attack in his brother; Heart disease in his father; Hypertension in his mother; Lung cancer in his maternal uncle; OCD in his maternal aunt. No  Known Allergies Current Outpatient Medications on File Prior to Visit  Medication Sig Dispense Refill   ALPRAZolam  (XANAX ) 0.5 MG tablet TAKE 1 TO 2 TABLETS BY MOUTH AT BEDTIME AS NEEDED 60 tablet 5   amLODipine  (NORVASC ) 5 MG tablet Take 1 tablet (5 mg total) by mouth daily. 90 tablet 3   aspirin  EC 81 MG tablet Take 1 tablet (81 mg total) by mouth daily. 90 tablet 11   clobetasol cream (TEMOVATE) 0.05 % Apply topically 2 (two) times daily.     cyanocobalamin  (VITAMIN B12) 1000 MCG tablet Take 1 tablet (1,000 mcg total) by mouth daily. 90 tablet 1   cyclobenzaprine  (FLEXERIL ) 5 MG tablet Take 5 mg by mouth at bedtime.     empagliflozin  (JARDIANCE ) 25 MG TABS tablet Take 1 tablet (25 mg total) by mouth daily before breakfast. 90 tablet 0   EQ ARTHRITIS PAIN RELIEVER 1 % GEL Apply topically 4 (four) times daily as needed.     FLUoxetine  (PROZAC ) 40 MG capsule Take 1 capsule (40 mg total) by mouth daily. 90 capsule 3   meclizine  (ANTIVERT ) 25 MG tablet Take 25 mg by mouth.     metFORMIN  (GLUCOPHAGE -XR) 500 MG 24 hr tablet TAKE 4 TABLETS BY MOUTH ONCE DAILY WITH BREAKFAST 360 tablet 3   metoprolol  tartrate (LOPRESSOR ) 25 MG tablet Take 1 tablet (25 mg total) by mouth 2 (two) times daily. 180 tablet 3   Multiple Vitamin (MULTIVITAMIN WITH MINERALS) TABS tablet Take 1 tablet by mouth daily.     Omega-3 Fatty Acids (FISH OIL PO) Take 1 capsule by mouth daily.     pantoprazole  (PROTONIX ) 40 MG tablet Take 1 tablet (40 mg total) by mouth 2 (two) times daily. 180 tablet 3   ramipril  (ALTACE ) 10 MG capsule Take 1 capsule (10 mg total) by mouth daily. 90 capsule 0   rosuvastatin  (CRESTOR ) 10 MG tablet Take 1 tablet (10 mg total) by mouth daily. 90 tablet 3   sucralfate  (CARAFATE ) 1 g tablet Take 1 tablet (1 g total) by mouth 4 (four) times daily -  with meals and at bedtime. 120 tablet 0   tadalafil  (CIALIS ) 20 MG tablet Take 1 tablet (20 mg total) by mouth daily as needed for erectile dysfunction. 15  tablet 11   traMADol  (ULTRAM ) 50 MG tablet Take 50 mg by mouth.     ZORYVE 0.3 % CREA Apply topically.     No current facility-administered medications on file prior to visit.        ROS:  All others reviewed and negative.  Objective  PE:  BP 132/74   Pulse (!) 58   Temp 98.6 F (37 C)   Ht 6' (1.829 m)   Wt 172 lb 9.6 oz (78.3 kg)   SpO2 98%   BMI 23.41 kg/m                 Constitutional: Pt appears in NAD               HENT: Head: NCAT.                Right Ear: External ear normal.                 Left Ear: External ear normal.                Eyes: . Pupils are equal, round, and reactive to light. Conjunctivae and EOM are normal               Nose: without d/c or deformity               Neck: Neck supple. Gross normal ROM               Cardiovascular: Normal rate and regular rhythm.                 Pulmonary/Chest: Effort normal and breath sounds without rales or wheezing.                Abd:  Soft, NT, ND, + BS, no organomegaly               Neurological: Pt is alert. At baseline orientation, motor grossly intact               Skin: Skin is warm. No rashes, no other new lesions, LE edema - none, tender over left greater trochanter               Psychiatric: Pt behavior is normal without agitation   Micro: none  Cardiac tracings I have personally interpreted today:  none  Pertinent Radiological findings (summarize): none   Lab Results  Component Value Date   WBC 4.7 10/18/2023   HGB 15.3 10/18/2023   HCT 45.4 10/18/2023   PLT 290.0 10/18/2023   GLUCOSE 81 10/18/2023   CHOL 133 10/18/2023   TRIG 92.0 10/18/2023   HDL 60.20 10/18/2023   LDLDIRECT 102.0 11/23/2016   LDLCALC 55 10/18/2023   ALT 28 10/18/2023   AST 21 10/18/2023   NA 141 10/18/2023   K 5.0 10/18/2023   CL 103 10/18/2023   CREATININE 0.94 10/18/2023   BUN 12 10/18/2023   CO2 25 10/18/2023   TSH 1.08 10/18/2023   PSA 2.55 10/18/2023   INR 0.96 12/11/2017   HGBA1C 7.1 (H) 10/18/2023    MICROALBUR 1.5 10/18/2023   Assessment/Plan:  MASSIMILIANO ROHLEDER [name] is a 58 y.o. White or Caucasian [1] male with  has a past medical history of Allergic rhinitis, Anxiety, Arthritis, Cholesteatoma of right ear, Diabetes mellitus (HCC), Dysuria, Enlarged prostate, GERD (gastroesophageal reflux disease), Headache, Hearing loss, Hernia of abdominal wall, History of gross hematuria, History of head injury, History of kidney stones, History of palpitations, Hyperlipidemia (10/18/2014), Hypertension, Insomnia, Lack of concentration, Nasal fracture, Otitis media (11/10/2017), Panic attacks, Syncope, and Vertigo.  Encounter for well adult exam with abnormal findings Age and sex appropriate education and counseling updated with regular exercise and diet Referrals for preventative services - for colonoscopy oct 2025, pt to call for eye exam  soon Immunizations addressed - none needed Smoking counseling  - none needed Evidence for depression or other mood disorder - none significant Most recent labs reviewed. I have personally reviewed and have noted: 1) the patient's medical and social history 2) The patient's current medications and supplements 3) The patient's height, weight, and BMI have been recorded in the chart   Hyperlipidemia Lab Results  Component Value Date   LDLCALC 55 10/18/2023   Stable, pt to continue current statin crestor  10 mg qd   Essential hypertension, benign BP Readings from Last 3 Encounters:  10/26/23 132/74  10/06/23 118/76  07/27/23 110/64   Stable, pt to continue medical treatment norvasc  5 every day, altace  10 every day, lopressor  25 bid   Diabetes mellitus due to underlying condition with hyperglycemia, without long-term current use of insulin  (HCC) With several low sugars recently, ok for reduced prandin  to 0.5 mg bid  B12 deficiency Lab Results  Component Value Date   VITAMINB12 293 10/18/2023   Low, to start oral replacement - b12 1000 mcg qd   Bursitis of  left hip Mild to mod, for mobic  15 mg every day prn, consider referral to sport med,  to f/u any worsening symptoms or concerns  Followup: Return in about 6 months (around 04/27/2024).  Lynwood Rush, MD 10/26/2023 8:55 PM [facility]

## 2023-10-26 NOTE — Assessment & Plan Note (Signed)
 With several low sugars recently, ok for reduced prandin  to 0.5 mg bid

## 2023-10-28 NOTE — Telephone Encounter (Signed)
 Attempted to call patient to see if medication had been picked up but call went to voice mail. Voice mail box was full so unable to leave a message

## 2023-11-02 ENCOUNTER — Encounter: Admitting: Internal Medicine

## 2023-11-04 ENCOUNTER — Telehealth: Payer: Self-pay | Admitting: Internal Medicine

## 2023-11-04 NOTE — Telephone Encounter (Signed)
 Copied from CRM [id number]. Topic: General - Billing Inquiry >> Nov 04, 2023  8:03 AM Robinson H wrote: Reason for CRM: Patient states he was in on 7/29 for his physical and was coded wrong to insurance received a bill for $40.00 and states it should have been fully covered and his wife didn't pay and on his insurance please reach out with some clarity. 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  [place] [phone]

## 2023-11-12 NOTE — Progress Notes (Deleted)
 Ben Jackson D.[facility]MENTEEN [facility] [address] [place] [address] Phone: [phone]   Assessment and Plan:     There are no diagnoses linked to this encounter.  ***   Pertinent previous records reviewed include ***    Follow Up: ***     Subjective:   I, Viyaan Champine, am serving as a Neurosurgeon for Doctor Morene Mace  Chief Complaint: ***  HPI:   11/15/2023  Relevant Historical Information: ***  Additional pertinent review of systems negative.   Current Outpatient Medications:    ALPRAZolam (XANAX) 0.5 MG tablet, TAKE 1 TO 2 TABLETS BY MOUTH AT BEDTIME AS NEEDED, Disp: 60 tablet, Rfl: 5   amLODipine (NORVASC) 5 MG tablet, Take 1 tablet (5 mg total) by mouth daily., Disp: 90 tablet, Rfl: 3   aspirin EC 81 MG tablet, Take 1 tablet (81 mg total) by mouth daily., Disp: 90 tablet, Rfl: 11   clobetasol cream (TEMOVATE) 0.05 %, Apply topically 2 (two) times daily., Disp: , Rfl:    cyanocobalamin (VITAMIN B12) 1000 MCG tablet, Take 1 tablet (1,000 mcg total) by mouth daily., Disp: 90 tablet, Rfl: 1   cyclobenzaprine (FLEXERIL) 5 MG tablet, Take 5 mg by mouth at bedtime., Disp: , Rfl:    empagliflozin (JARDIANCE) 25 MG TABS tablet, Take 1 tablet (25 mg total) by mouth daily before breakfast., Disp: 90 tablet, Rfl: 0   EQ ARTHRITIS PAIN RELIEVER 1 % GEL, Apply topically 4 (four) times daily as needed., Disp: , Rfl:    FLUoxetine (PROZAC) 40 MG capsule, Take 1 capsule (40 mg total) by mouth daily., Disp: 90 capsule, Rfl: 3   meclizine (ANTIVERT) 25 MG tablet, Take 25 mg by mouth., Disp: , Rfl:    meloxicam (MOBIC) 15 MG tablet, Take 1 tablet (15 mg total) by mouth daily as needed., Disp: 90 tablet, Rfl: 1   metFORMIN (GLUCOPHAGE-XR) 500 MG 24 hr tablet, TAKE 4 TABLETS BY MOUTH ONCE DAILY WITH BREAKFAST, Disp: 360 tablet, Rfl: 3   metoprolol tartrate (LOPRESSOR) 25 MG tablet, Take 1 tablet (25 mg total) by mouth 2 (two) times daily., Disp: 180  tablet, Rfl: 3   Multiple Vitamin (MULTIVITAMIN WITH MINERALS) TABS tablet, Take 1 tablet by mouth daily., Disp: , Rfl:    Omega-3 Fatty Acids (FISH OIL PO), Take 1 capsule by mouth daily., Disp: , Rfl:    pantoprazole (PROTONIX) 40 MG tablet, Take 1 tablet (40 mg total) by mouth 2 (two) times daily., Disp: 180 tablet, Rfl: 3   ramipril (ALTACE) 10 MG capsule, Take 1 capsule (10 mg total) by mouth daily., Disp: 90 capsule, Rfl: 0   repaglinide (PRANDIN) 0.5 MG tablet, Take 1 tablet (0.5 mg total) by mouth 2 (two) times daily before a meal., Disp: 180 tablet, Rfl: 3   rosuvastatin (CRESTOR) 10 MG tablet, Take 1 tablet (10 mg total) by mouth daily., Disp: 90 tablet, Rfl: 3   sucralfate (CARAFATE) 1 g tablet, Take 1 tablet (1 g total) by mouth 4 (four) times daily -  with meals and at bedtime., Disp: 120 tablet, Rfl: 0   tadalafil (CIALIS) 20 MG tablet, Take 1 tablet (20 mg total) by mouth daily as needed for erectile dysfunction., Disp: 15 tablet, Rfl: 11   traMADol (ULTRAM) 50 MG tablet, Take 50 mg by mouth., Disp: , Rfl:    ZORYVE 0.3 % CREA, Apply topically., Disp: , Rfl:    Objective:     There were no vitals filed  for this visit.    There is no height or weight on file to calculate BMI.    Physical Exam:    ***   Electronically signed by:  Odis Mace D.[facility] 7:49 AM 11/12/23

## 2023-11-15 ENCOUNTER — Ambulatory Visit: Admitting: Sports Medicine

## 2023-11-30 ENCOUNTER — Encounter: Payer: Self-pay | Admitting: Sports Medicine

## 2023-12-09 ENCOUNTER — Ambulatory Visit: Admitting: Physician Assistant

## 2023-12-12 ENCOUNTER — Emergency Department (HOSPITAL_COMMUNITY): Payer: Worker's Compensation

## 2023-12-12 ENCOUNTER — Emergency Department (HOSPITAL_COMMUNITY)
Admission: EM | Admit: 2023-12-12 | Discharge: 2023-12-12 | Disposition: A | Discharge status: Home / Self Care | Payer: Worker's Compensation | Attending: Emergency Medicine | Admitting: Emergency Medicine

## 2023-12-12 ENCOUNTER — Encounter (HOSPITAL_COMMUNITY): Payer: Self-pay | Admitting: *Deleted

## 2023-12-12 ENCOUNTER — Other Ambulatory Visit: Payer: Self-pay

## 2023-12-12 DIAGNOSIS — S51811A Laceration without foreign body of right forearm, initial encounter: Secondary | ICD-10-CM | POA: Diagnosis present

## 2023-12-12 DIAGNOSIS — W311XXA Contact with metalworking machines, initial encounter: Secondary | ICD-10-CM | POA: Diagnosis not present

## 2023-12-12 DIAGNOSIS — Y990 Civilian activity done for income or pay: Secondary | ICD-10-CM | POA: Insufficient documentation

## 2023-12-12 DIAGNOSIS — Z7982 Long term (current) use of aspirin: Secondary | ICD-10-CM | POA: Insufficient documentation

## 2023-12-12 MED ORDER — CEPHALEXIN 500 MG PO CAPS: 500.0000 mg | ORAL_CAPSULE | Freq: Three times a day (TID) | ORAL | 0 refills | Status: AC

## 2023-12-12 MED ORDER — LIDOCAINE-EPINEPHRINE (PF) 2 %-1:200000 IJ SOLN
10.0000 mL | Freq: Once | INTRAMUSCULAR | Status: AC
  Administered 2023-12-12: 10 mL
  Filled 2023-12-12: qty 20

## 2023-12-12 NOTE — ED Provider Notes (Signed)
 [place] EMERGENCY DEPARTMENT AT [facility] Provider Note   CSN: [id number] Arrival date & time: 12/12/23  9950     Patient presents with: Laceration   Jacob [name] is a 58 y.o. male.   58 year old male presents for laceration to the right forearm.  Patient states that he was using a metal grinder when he excellently cut his arm.  Bleeding controlled with pressure.  Not anticoagulated. Last tetanus 1 month ago.       Prior to Admission medications   Medication Sig Start Date End Date Taking? Authorizing Provider  cephALEXin  (KEFLEX ) 500 MG capsule Take 1 capsule (500 mg total) by mouth 3 (three) times daily for 7 days. 12/12/23 12/19/23 Yes Beverley Leita LABOR, PA-C  ALPRAZolam  (XANAX ) 0.5 MG tablet TAKE 1 TO 2 TABLETS BY MOUTH AT BEDTIME AS NEEDED 06/30/23   Norleen Lynwood ORN, MD  amLODipine  (NORVASC ) 5 MG tablet Take 1 tablet (5 mg total) by mouth daily. 10/06/23 10/05/24  Norleen Lynwood ORN, MD  aspirin  EC 81 MG tablet Take 1 tablet (81 mg total) by mouth daily. 07/23/21   Norleen Lynwood ORN, MD  clobetasol cream (TEMOVATE) 0.05 % Apply topically 2 (two) times daily. 02/27/23   [provider]  cyanocobalamin  (VITAMIN B12) 1000 MCG tablet Take 1 tablet (1,000 mcg total) by mouth daily. 12/11/21   Norleen Lynwood ORN, MD  cyclobenzaprine  (FLEXERIL ) 5 MG tablet Take 5 mg by mouth at bedtime.    [provider]  empagliflozin  (JARDIANCE ) 25 MG TABS tablet Take 1 tablet (25 mg total) by mouth daily before breakfast. 10/06/23   Norleen Lynwood ORN, MD  EQ ARTHRITIS PAIN RELIEVER 1 % GEL Apply topically 4 (four) times daily as needed. 03/12/23   [provider]  FLUoxetine  (PROZAC ) 40 MG capsule Take 1 capsule (40 mg total) by mouth daily. 10/06/23   Norleen Lynwood ORN, MD  meclizine  (ANTIVERT ) 25 MG tablet Take 25 mg by mouth. 05/12/23   [provider]  meloxicam  (MOBIC ) 15 MG tablet Take 1 tablet (15 mg total) by mouth daily as needed. 10/26/23   Norleen Lynwood ORN, MD  metFORMIN   (GLUCOPHAGE -XR) 500 MG 24 hr tablet TAKE 4 TABLETS BY MOUTH ONCE DAILY WITH BREAKFAST 10/06/23   Norleen Lynwood ORN, MD  metoprolol  tartrate (LOPRESSOR ) 25 MG tablet Take 1 tablet (25 mg total) by mouth 2 (two) times daily. 10/06/23 01/04/24  Norleen Lynwood ORN, MD  Multiple Vitamin (MULTIVITAMIN WITH MINERALS) TABS tablet Take 1 tablet by mouth daily.    [provider]  Omega-3 Fatty Acids (FISH OIL PO) Take 1 capsule by mouth daily.    [provider]  pantoprazole  (PROTONIX ) 40 MG tablet Take 1 tablet (40 mg total) by mouth 2 (two) times daily. 10/06/23   Norleen Lynwood ORN, MD  ramipril  (ALTACE ) 10 MG capsule Take 1 capsule (10 mg total) by mouth daily. 10/06/23   Norleen Lynwood ORN, MD  repaglinide  (PRANDIN ) 0.5 MG tablet Take 1 tablet (0.5 mg total) by mouth 2 (two) times daily before a meal. 10/26/23   Norleen Lynwood ORN, MD  rosuvastatin  (CRESTOR ) 10 MG tablet Take 1 tablet (10 mg total) by mouth daily. 10/06/23   Norleen Lynwood ORN, MD  sucralfate  (CARAFATE ) 1 g tablet Take 1 tablet (1 g total) by mouth 4 (four) times daily -  with meals and at bedtime. 10/06/23   Norleen Lynwood ORN, MD  tadalafil  (CIALIS ) 20 MG tablet Take 1 tablet (20 mg total) by mouth  daily as needed for erectile dysfunction. 03/08/23   Norleen Lynwood ORN, MD  traMADol  (ULTRAM ) 50 MG tablet Take 50 mg by mouth. 04/29/23   [provider]  ZORYVE 0.3 % CREA Apply topically. 06/01/23   [provider]    Allergies: Patient has no known allergies.    Review of Systems Negative except as per HPI Updated Vital Signs BP 116/77   Pulse (!) 55   Temp 97.7 F (36.5 C)   Resp 16   Ht 5' 11 (1.803 m)   Wt 77.1 kg   SpO2 99%   BMI 23.71 kg/m   Physical Exam Vitals and nursing note reviewed.  Constitutional:      General: He is not in acute distress.    Appearance: He is well-developed. He is not diaphoretic.  HENT:     Head: Normocephalic and atraumatic.  Cardiovascular:     Pulses: Normal pulses.  Pulmonary:     Effort:  Pulmonary effort is normal.  Musculoskeletal:        General: Signs of injury present.  Skin:    General: Skin is warm and dry.     Comments: 5.5 cm laceration to the right forearm   Neurological:     Mental Status: He is alert and oriented to person, place, and time.     Sensory: No sensory deficit.     Motor: No weakness.  Psychiatric:        Behavior: Behavior normal.     (all labs ordered are listed, but only abnormal results are displayed) Labs Reviewed - No data to display  EKG: None  Radiology: DG Forearm Right Result Date: 12/12/2023 EXAM: 2 VIEWS XRAY OF THE RIGHT FOREARM 12/12/2023 02:21:04 AM COMPARISON: None available. CLINICAL HISTORY: Laceration. FINDINGS: BONES AND JOINTS: No acute fracture. No focal osseous lesion. No joint dislocation. SOFT TISSUES: Surgical dressing overlies the right radial soft tissues at the level of the antecubital fossa. No retained radiopaque foreign body. IMPRESSION: 1. No acute fracture or dislocation. 2. No retained radiopaque foreign body. Electronically signed by: Dorethia Molt MD 12/12/2023 02:24 AM EDT RP Workstation: [id number]     .Laceration Repair  Date/Time: 12/12/2023 3:00 AM  Performed by: Beverley Leita LABOR, PA-C Authorized by: Beverley Leita LABOR, PA-C   Consent:    Consent obtained:  Verbal   Consent given by:  Patient   Risks, benefits, and alternatives were discussed: yes     Risks discussed:  Infection, pain, poor cosmetic result and poor wound healing   Alternatives discussed:  No treatment Universal protocol:    Patient identity confirmed:  Verbally with patient Anesthesia:    Anesthesia method:  Local infiltration   Local anesthetic:  Lidocaine  2% WITH epi Laceration details:    Location:  Shoulder/arm   Shoulder/arm location:  R lower arm   Length (cm):  5.5   Depth (mm):  5 Pre-procedure details:    Preparation:  Patient was prepped and draped in usual sterile fashion and imaging obtained to evaluate for  foreign bodies Exploration:    Hemostasis achieved with:  Epinephrine    Imaging obtained: x-ray     Imaging outcome: foreign body not noted     Wound exploration: wound explored through full range of motion and entire depth of wound visualized     Wound extent: muscle damage     Wound extent: no foreign body     Contaminated: yes   Treatment:    Area cleansed with:  Saline  Amount of cleaning:  Extensive   Irrigation solution:  Sterile saline   Irrigation method:  Pressure wash   Debridement:  None   Undermining:  None Skin repair:    Repair method:  Sutures   Suture size:  4-0   Suture material:  Nylon   Suture technique:  Simple interrupted   Number of sutures:  7 Approximation:    Approximation:  Close Repair type:    Repair type:  Simple Post-procedure details:    Dressing:  Bulky dressing   Procedure completion:  Tolerated well, no immediate complications    Medications Ordered in the ED  lidocaine -EPINEPHrine  (XYLOCAINE  W/EPI) 2 %-1:200000 (PF) injection 10 mL (10 mLs Infiltration Given 12/12/23 0304)                                    Medical Decision Making Amount and/or Complexity of Data Reviewed Radiology: ordered.  Risk Prescription drug management.   58 year old male with history of diabetes presents with laceration to the right forearm as result of a work accident while grinding metal today.  Bleeding is controlled with pressure.  X-ray of the right forearm as ordered her myself is negative for bony abnormality or retained foreign body.  Agree with radiologist interpretation.  The wound was anesthetized with lidocaine  with epi which controlled the bleeding.  It was thoroughly irrigated with 1 L of normal saline .  Due to the nature of patient's job, the wound and surrounding skin was notably dirty.  This was cleaned with saline.  Wound was closed with simple interrupted sutures.  There did appear to be some involvement of the muscle in the forearm without  any associated hand weakness.  Patient is referred to orthopedics for wound recheck, discussed this with patient.  Also started on Keflex  due to history of diabetes with contaminated wound.  Tetanus is up-to-date.     Final diagnoses:  Laceration of right forearm, initial encounter    ED Discharge Orders          Ordered    cephALEXin  (KEFLEX ) 500 MG capsule  3 times daily        12/12/23 0304               Beverley Leita LABOR, PA-C 12/12/23 0305    Franklyn Sid SAILOR, MD 12/12/23 [phone]

## 2023-12-12 NOTE — ED Notes (Signed)
 Pt came to lobby nurse desk and said his wound was actively bleeding again under dressing applied at work with blood soaked gauze. This RN took pt to triage and unwrapped the wound and bleeding was heavy, dressing and gauze reapplied with coban wrap. Arm resting on bedside table above heart level, awaiting room placement.

## 2023-12-12 NOTE — Discharge Instructions (Addendum)
 Your laceration involves the muscle of your forearm.  There is no obvious complete tear or weakness in the arm at this time however if you feel like you have weakness in your recovery, recommend that you see orthopedics.  Wound recheck in 2 days can be done through orthopedics or the Worker's Comp. office for your employer.  Take Keflex  as prescribed and complete the full course.  This is to try and reduce risk of infection.

## 2023-12-12 NOTE — ED Triage Notes (Signed)
 Pt reports he cut his right upper forearm tonight around 0030 with the blade of a metal grinder. Wound is bandaged, bleeding controlled. Sensation intact. Last tetanus last month.

## 2023-12-31 ENCOUNTER — Other Ambulatory Visit: Payer: Self-pay

## 2023-12-31 ENCOUNTER — Other Ambulatory Visit: Payer: Self-pay | Admitting: Internal Medicine

## 2024-01-10 ENCOUNTER — Other Ambulatory Visit: Payer: Self-pay | Admitting: Internal Medicine

## 2024-02-04 ENCOUNTER — Other Ambulatory Visit: Payer: Self-pay | Admitting: Internal Medicine

## 2024-02-23 ENCOUNTER — Ambulatory Visit: Admitting: Gastroenterology

## 2024-02-23 ENCOUNTER — Encounter: Payer: Self-pay | Admitting: Gastroenterology

## 2024-02-23 VITALS — BP 126/72 | HR 60 | Ht 71.0 in | Wt 178.0 lb

## 2024-02-23 DIAGNOSIS — Z860100 Personal history of colon polyps, unspecified: Secondary | ICD-10-CM | POA: Diagnosis not present

## 2024-02-23 DIAGNOSIS — K219 Gastro-esophageal reflux disease without esophagitis: Secondary | ICD-10-CM

## 2024-02-23 DIAGNOSIS — F1090 Alcohol use, unspecified, uncomplicated: Secondary | ICD-10-CM

## 2024-02-23 DIAGNOSIS — R112 Nausea with vomiting, unspecified: Secondary | ICD-10-CM | POA: Diagnosis not present

## 2024-02-23 MED ORDER — NA SULFATE-K SULFATE-MG SULF 17.5-3.13-1.6 GM/177ML PO SOLN: 1.0000 | Freq: Once | ORAL | 0 refills | Status: AC

## 2024-02-23 NOTE — Patient Instructions (Addendum)
 _______________________________________________________  If your blood pressure at your visit was 140/90 or greater, please contact your primary care physician to follow up on this.  _______________________________________________________  If you are age 57 or older, your body mass index should be between 23-30. Your Body mass index is 24.83 kg/m. If this is out of the aforementioned range listed, please consider follow up with your Primary Care Provider.  If you are age 52 or younger, your body mass index should be between 19-25. Your Body mass index is 24.83 kg/m. If this is out of the aformentioned range listed, please consider follow up with your Primary Care Provider.   ________________________________________________________  The [facility] providers would like to encourage you to use MYCHART to communicate with providers for non-urgent requests or questions.  Due to long hold times on the telephone, sending your provider a message by [facility] may be a faster and more efficient way to get a response.  Please allow 48 business hours for a response.  Please remember that this is for non-urgent requests.  _______________________________________________________  [facility] is using a team-based approach to care.  Your team is made up of your doctor and two to three APPS. Our APPS (Nurse Practitioners and Physician Assistants) work with your physician to ensure care continuity for you. They are fully qualified to address your health concerns and develop a treatment plan. They communicate directly with your gastroenterologist to care for you. Seeing the Advanced Practice Practitioners on your physician's team can help you by facilitating care more promptly, often allowing for earlier appointments, access to diagnostic testing, procedures, and other specialty referrals.   Your provider has requested that you go to the basement level for lab work. Press B on the elevator. The lab is  located at the first door on the left as you exit the elevator. They are closed November 27 and 28 and will open back up December 1st. Open Monday through Friday 7:15am to 530pm  We have sent the following medications to your pharmacy for you to pick up at your convenience: Suprep  You have been scheduled for an endoscopy and colonoscopy. Please follow the written instructions given to you at your visit today.  If you use inhalers (even only as needed), please bring them with you on the day of your procedure.  DO NOT TAKE 7 DAYS PRIOR TO TEST- Trulicity (dulaglutide) Ozempic, Wegovy (semaglutide) Mounjaro, Zepbound (tirzepatide) Bydureon Bcise (exanatide extended release)  DO NOT TAKE 1 DAY PRIOR TO YOUR TEST Rybelsus (semaglutide) Adlyxin (lixisenatide) Victoza (liraglutide) Byetta (exanatide) ___________________________________________________________________________  It was a pleasure to see you today!  Thank you for trusting me with your gastrointestinal care!

## 2024-02-23 NOTE — Progress Notes (Signed)
 Chief Complaint: Follow-up of nausea, vomiting, weight Primary GI MD: Dr. Albertus  HPI: Discussed the use of AI scribe software for clinical note transcription with the patient, who gave verbal consent to proceed.  History of Present Illness  Jacob [name] is a 58 year old male who presents with gastrointestinal symptoms and weight loss.  He has a history of nausea, abdominal pain, and weight loss, previously evaluated with a colonoscopy and endoscopy revealing multiple polyps.  Over the summer, he experienced worsening nausea and stomach issues, leading to occasional vomiting. He also noticed light brown-colored stools. He initially lost weight, dropping from 190 lbs to 160 lbs, but has since regained some weight, now approximately 172 lbs. LFTs are normal.   He is currently taking pantoprazole  once daily, although it is prescribed twice daily. It helps with acid reflux but not with nausea. He was also prescribed Carafate  but has difficulty taking large pills. He discontinued meloxicam  due to concerns about its effects on his heart and stomach.  A CT scan in 2024 showed no abnormalities in the pancreas or liver, and recent liver enzyme tests in September were normal. No blood in stool or black stools. Nausea occurs unpredictably and is not associated with meals or specific foods.    PREVIOUS GI WORKUP   01/20/2021 colonoscopy and endoscopy with Dr. Albertus for screening purposes with family history and GERD/nausea Colonoscopy showed 3 polyps ascending colon 3 to 5 mm, 2 polyps transverse colon 3 to 4 mm, small internal hemorrhoids, 5 TA polyps, recall 3 years or 12/2023 Endoscopy showed normal esophagus, multiple gastric polyps benign, gastritis, normal duodenum, mild chronic gastritis negative H. pylori, benign polyps.   09/11/2021 abdominal ultrasound showed increased echogenicity of the liver parenchyma and small renal cyst 12/10/2021 B12 deficiency 201  Past Medical History:   Diagnosis Date   Allergic rhinitis    Anxiety    Arthritis    Cholesteatoma of right ear    Diabetes mellitus (HCC)    bordreline   Dysuria    Enlarged prostate    pt unaware   GERD (gastroesophageal reflux disease)    Headache    Hearing loss    Right ear   Hernia of abdominal wall    History of gross hematuria    History of head injury    History of kidney stones    History of palpitations    Hyperlipidemia 10/18/2014   Hypertension    Insomnia    Lack of concentration    Nasal fracture    Several   Otitis media 11/10/2017   Bilateral   Panic attacks    Syncope    Vertigo     Past Surgical History:  Procedure Laterality Date   CHOLECYSTECTOMY     CYSTOSCOPY N/A 05/10/2018   Procedure: CYSTOSCOPY WITH EXAM UNDER ANESTHESIA;  Surgeon: Devere Lonni Righter, MD;  Location: [facility];  Service: Urology;  Laterality: N/A;  ONLY NEEDS 30 MIN   CYSTOSCOPY N/A 07/17/2022   Procedure: CYSTOSCOPY, BILATERAL RETROGRADE PYELOGRAM;  Surgeon: Carolee Sherwood JONETTA DOUGLAS, MD;  Location: [facility];  Service: Urology;  Laterality: N/A;   HYPOSPADIAS CORRECTION N/A 1978   INNER EAR SURGERY     right   LUMBAR DISC SURGERY     MASTOIDECTOMY Right    NOSE SURGERY     Fracture   tubes in bil ears     UPPER GASTROINTESTINAL ENDOSCOPY     UPPER GI ENDOSCOPY  01/09/2012  Esophageal dilation    Current Outpatient Medications  Medication Sig Dispense Refill   ALPRAZolam  (XANAX ) 0.5 MG tablet TAKE ONE TO TWO TABLETS BY MOUTH AT BEDTIME AS NEEDED 60 tablet 2   amLODipine  (NORVASC ) 5 MG tablet Take 1 tablet (5 mg total) by mouth daily. 90 tablet 3   aspirin  EC 81 MG tablet Take 1 tablet (81 mg total) by mouth daily. 90 tablet 11   clobetasol cream (TEMOVATE) 0.05 % Apply topically 2 (two) times daily.     cyanocobalamin  (VITAMIN B12) 1000 MCG tablet Take 1 tablet (1,000 mcg total) by mouth daily. 90 tablet 1   cyclobenzaprine  (FLEXERIL ) 5 MG tablet Take 5  mg by mouth at bedtime.     EQ ARTHRITIS PAIN RELIEVER 1 % GEL Apply topically 4 (four) times daily as needed.     FLUoxetine  (PROZAC ) 40 MG capsule Take 1 capsule (40 mg total) by mouth daily. 90 capsule 3   JARDIANCE  25 MG TABS tablet TAKE 1 TABLET BY MOUTH DAILY BEFORE BREAKFAST 90 tablet 0   metFORMIN  (GLUCOPHAGE -XR) 500 MG 24 hr tablet TAKE 4 TABLETS BY MOUTH ONCE DAILY WITH BREAKFAST 360 tablet 3   Multiple Vitamin (MULTIVITAMIN WITH MINERALS) TABS tablet Take 1 tablet by mouth daily.     Na Sulfate-K Sulfate-Mg Sulfate concentrate (SUPREP) 17.5-3.13-1.6 GM/177ML SOLN Take 1 kit (354 mLs total) by mouth once for 1 dose. 354 mL 0   Omega-3 Fatty Acids (FISH OIL PO) Take 1 capsule by mouth daily.     pantoprazole  (PROTONIX ) 40 MG tablet Take 1 tablet (40 mg total) by mouth 2 (two) times daily. 180 tablet 3   ramipril  (ALTACE ) 10 MG capsule TAKE 1 CAPSULE BY MOUTH DAILY 90 capsule 0   repaglinide  (PRANDIN ) 0.5 MG tablet Take 1 tablet (0.5 mg total) by mouth 2 (two) times daily before a meal. 180 tablet 3   rosuvastatin  (CRESTOR ) 10 MG tablet Take 1 tablet (10 mg total) by mouth daily. 90 tablet 3   sucralfate  (CARAFATE ) 1 g tablet Take 1 tablet (1 g total) by mouth 4 (four) times daily -  with meals and at bedtime. 120 tablet 0   tadalafil  (CIALIS ) 20 MG tablet Take 1 tablet (20 mg total) by mouth daily as needed for erectile dysfunction. 15 tablet 11   ZORYVE 0.3 % CREA Apply topically.     meclizine  (ANTIVERT ) 25 MG tablet Take 25 mg by mouth. (Patient not taking: Reported on 02/23/2024)     meloxicam  (MOBIC ) 15 MG tablet Take 1 tablet (15 mg total) by mouth daily as needed. (Patient not taking: Reported on 02/23/2024) 90 tablet 1   metoprolol  tartrate (LOPRESSOR ) 25 MG tablet Take 1 tablet (25 mg total) by mouth 2 (two) times daily. (Patient not taking: Reported on 02/23/2024) 180 tablet 3   traMADol  (ULTRAM ) 50 MG tablet Take 50 mg by mouth. (Patient not taking: Reported on 02/23/2024)      No current facility-administered medications for this visit.    Allergies as of 02/23/2024   (No Known Allergies)    Family History  Problem Relation Age of Onset   COPD Mother    Hypertension Mother    Anxiety disorder Mother    Heart disease Father    Cancer Father    Diabetes Paternal Grandmother    Diabetes Maternal Grandmother    Anxiety disorder Maternal Grandmother    Heart attack Brother    Lung cancer Maternal Uncle    Anxiety disorder Maternal Uncle  Anxiety disorder Maternal Aunt    OCD Maternal Aunt    Anxiety disorder Cousin    ADD / ADHD Son    Drug abuse Son    Colon cancer Neg Hx    Esophageal cancer Neg Hx    Rectal cancer Neg Hx    Stomach cancer Neg Hx     Social History   Socioeconomic History   Marital status: Single    Spouse name: Not on file   Number of children: 3   Years of education: Not on file   Highest education level: 12th grade  Occupational History   Occupation: Sports Administrator: [employer]  Tobacco Use   Smoking status: Never   Smokeless tobacco: Never  Vaping Use   Vaping status: Never Used  Substance and Sexual Activity   Alcohol use: Yes    Alcohol/week: 5.0 standard drinks of alcohol    Types: 5 Glasses of wine per week    Comment: weekends   Drug use: No   Sexual activity: Yes    Partners: Female    Birth control/protection: None  Other Topics Concern   Not on file  Social History Narrative   Not on file   Social Drivers of Health   Financial Resource Strain: Low Risk  (11/30/2022)   Received from [facility]   Overall Financial Resource Strain (CARDIA)    Difficulty of Paying Living Expenses: Not very hard  Food Insecurity: Food Insecurity Present (11/30/2022)   Received from [facility]   Hunger Vital Sign    Within the past 12 months, you worried that your food would run out before you got the money to buy more.: Never true    Within the past 12 months, the food you bought just didn't last  and you didn't have money to get more.: Sometimes true  Transportation Needs: No Transportation Needs (11/30/2022)   Received from [facility] - Transportation    Lack of Transportation (Medical): No    Lack of Transportation (Non-Medical): No  Physical Activity: Insufficiently Active (11/30/2022)   Received from [facility]   Exercise Vital Sign    On average, how many days per week do you engage in moderate to strenuous exercise (like a brisk walk)?: 2 days    On average, how many minutes do you engage in exercise at this level?: 20 min  Stress: No Stress Concern Present (11/30/2022)   Received from [facility] - Occupational Stress Questionnaire    Feeling of Stress : Only a little  Social Connections: Moderately Integrated (11/30/2022)   Received from [facility]   Social Network    How would you rate your social network (family, work, friends)?: Adequate participation with social networks  Intimate Partner Violence: Not At Risk (12/21/2023)   Received from [facility]   HITS    Over the last 12 months how often did your partner physically hurt you?: Never    Over the last 12 months how often did your partner insult you or talk down to you?: Never    Over the last 12 months how often did your partner threaten you with physical harm?: Never    Over the last 12 months how often did your partner scream or curse at you?: Never    Review of Systems:    Constitutional: No weight loss, fever, chills, weakness or fatigue HEENT: Eyes: No change in vision  Ears, Nose, Throat:  No change in hearing or congestion Skin: No rash or itching Cardiovascular: No chest pain, chest pressure or palpitations   Respiratory: No SOB or cough Gastrointestinal: See HPI and otherwise negative Genitourinary: No dysuria or change in urinary frequency Neurological: No headache, dizziness or syncope Musculoskeletal: No new muscle or joint  pain Hematologic: No bleeding or bruising Psychiatric: No history of depression or anxiety    Physical Exam:  Vital signs: BP 126/72   Pulse 60   Ht 5' 11 (1.803 m)   Wt 178 lb (80.7 kg)   BMI 24.83 kg/m   Constitutional: NAD, alert and cooperative Head:  Normocephalic and atraumatic. Eyes:   PEERL, EOMI. No icterus. Conjunctiva pink. Respiratory: Respirations even and unlabored. Lungs clear to auscultation bilaterally.   No wheezes, crackles, or rhonchi.  Cardiovascular:  Regular rate and rhythm. No peripheral edema, cyanosis or pallor.  Gastrointestinal:  Soft, nondistended, nontender. No rebound or guarding. Normal bowel sounds. No appreciable masses or hepatomegaly. Rectal:  Declines Msk:  Symmetrical without gross deformities. Without edema, no deformity or joint abnormality.  Neurologic:  Alert and  oriented x4;  grossly normal neurologically.  Skin:   Dry and intact without significant lesions or rashes. Psychiatric: Oriented to person, place and time. Demonstrates good judgement and reason without abnormal affect or behaviors.   RELEVANT LABS AND IMAGING: CBC    Component Value Date/Time   WBC 4.7 10/18/2023 0802   RBC 5.04 10/18/2023 0802   HGB 15.3 10/18/2023 0802   HCT 45.4 10/18/2023 0802   PLT 290.0 10/18/2023 0802   MCV 90.1 10/18/2023 0802   MCH 29.9 05/15/2021 1812   MCHC 33.6 10/18/2023 0802   RDW 13.5 10/18/2023 0802   LYMPHSABS 1.6 10/18/2023 0802   MONOABS 0.3 10/18/2023 0802   EOSABS 0.1 10/18/2023 0802   BASOSABS 0.0 10/18/2023 0802    CMP     Component Value Date/Time   NA 141 10/18/2023 0802   NA 137 02/08/2023 1611   K 5.0 10/18/2023 0802   CL 103 10/18/2023 0802   CO2 25 10/18/2023 0802   GLUCOSE 81 10/18/2023 0802   BUN 12 10/18/2023 0802   BUN 18 02/08/2023 1611   CREATININE 0.94 10/18/2023 0802   CREATININE 1.37 (H) 10/07/2014 1202   CALCIUM  9.6 10/18/2023 0802   PROT 7.2 10/18/2023 0802   ALBUMIN 4.6 10/18/2023 0802   AST 21  10/18/2023 0802   ALT 28 10/18/2023 0802   ALKPHOS 74 10/18/2023 0802   BILITOT 0.6 10/18/2023 0802   GFRNONAA >60 05/15/2021 1812   GFRNONAA 61 10/07/2014 1202   GFRAA >60 03/09/2019 1335   GFRAA 70 10/07/2014 1202     Assessment/Plan:   Nausea/vomiting Weight loss GERD Nausea/vomiting without identifiable trigger.  S/p cholecystectomy  EGD 12/2020 gastritis but negative biopsies Still on pantoprazole  once daily CTAP W contrast 06/2022 unrevealing Reported loss this year of 190lbs to 170lbs now with regaining weight (though noted to be 179 at last OV in 2024). Patient would like EGD with his colonoscopy -- EGD for further evaluation - Discussed risks versus benefits - Recommend increasing pantoprazole  to twice daily and avoiding alcohol - Educated patient on lifestyle modifications and provided patient education handout. - CBC/CMP (patient request this to be done at a later date)   Benign neoplasm of ascending colon Colonoscopy showed 3 polyps ascending colon 3 to 5 mm, 2 polyps transverse colon 3 to 4 mm, small internal hemorrhoids, 5 TA polyps, recall 3 years  or 12/2023 - Schedule colonoscopy - I thoroughly discussed the procedure with the patient (at bedside) to include nature of the procedure, alternatives, benefits, and risks (including but not limited to bleeding, infection, perforation, anesthesia/cardiac pulmonary complications).  Patient verbalized understanding and gave verbal consent to proceed with procedure.    Alcohol use Red wine likely contributing to symptoms. -Alcohol Abstinence counseling discussed with patient as continued use is strongly associated with worsening liver disease progression - get on MVIT - resources given, discuss with PCP   B12 def Get on B12 sublingual   Melisa Donofrio [facility] 02/23/2024, 8:53 AM  Cc: Norleen Lynwood ORN, MD

## 2024-03-31 ENCOUNTER — Other Ambulatory Visit: Payer: Self-pay | Admitting: Internal Medicine

## 2024-03-31 ENCOUNTER — Other Ambulatory Visit: Payer: Self-pay

## 2024-04-14 ENCOUNTER — Other Ambulatory Visit

## 2024-04-14 ENCOUNTER — Other Ambulatory Visit: Payer: Self-pay | Admitting: Internal Medicine

## 2024-04-14 ENCOUNTER — Other Ambulatory Visit: Payer: Self-pay

## 2024-04-14 DIAGNOSIS — R112 Nausea with vomiting, unspecified: Secondary | ICD-10-CM | POA: Diagnosis not present

## 2024-04-14 DIAGNOSIS — K219 Gastro-esophageal reflux disease without esophagitis: Secondary | ICD-10-CM | POA: Diagnosis not present

## 2024-04-14 LAB — COMPREHENSIVE METABOLIC PANEL WITH GFR
ALT: 26 U/L (ref 3–53)
AST: 13 U/L (ref 5–37)
Albumin: 4.5 g/dL (ref 3.5–5.2)
Alkaline Phosphatase: 69 U/L (ref 39–117)
BUN: 17 mg/dL (ref 6–23)
CO2: 28 meq/L (ref 19–32)
Calcium: 9.5 mg/dL (ref 8.4–10.5)
Chloride: 103 meq/L (ref 96–112)
Creatinine, Ser: 1.1 mg/dL (ref 0.40–1.50)
GFR: 74.75 mL/min
Glucose, Bld: 208 mg/dL — ABNORMAL HIGH (ref 70–99)
Potassium: 4.4 meq/L (ref 3.5–5.1)
Sodium: 140 meq/L (ref 135–145)
Total Bilirubin: 0.4 mg/dL (ref 0.2–1.2)
Total Protein: 7.3 g/dL (ref 6.0–8.3)

## 2024-04-14 LAB — CBC WITH DIFFERENTIAL/PLATELET
Basophils Absolute: 0 K/uL (ref 0.0–0.1)
Basophils Relative: 0.5 % (ref 0.0–3.0)
Eosinophils Absolute: 0.1 K/uL (ref 0.0–0.7)
Eosinophils Relative: 1.4 % (ref 0.0–5.0)
HCT: 48.7 % (ref 39.0–52.0)
Hemoglobin: 16.2 g/dL (ref 13.0–17.0)
Lymphocytes Relative: 36 % (ref 12.0–46.0)
Lymphs Abs: 1.6 K/uL (ref 0.7–4.0)
MCHC: 33.3 g/dL (ref 30.0–36.0)
MCV: 87.4 fl (ref 78.0–100.0)
Monocytes Absolute: 0.5 K/uL (ref 0.1–1.0)
Monocytes Relative: 10.2 % (ref 3.0–12.0)
Neutro Abs: 2.4 K/uL (ref 1.4–7.7)
Neutrophils Relative %: 51.9 % (ref 43.0–77.0)
Platelets: 257 K/uL (ref 150.0–400.0)
RBC: 5.57 Mil/uL (ref 4.22–5.81)
RDW: 14 % (ref 11.5–15.5)
WBC: 4.5 K/uL (ref 4.0–10.5)

## 2024-04-17 ENCOUNTER — Encounter: Payer: Self-pay | Admitting: Internal Medicine

## 2024-04-18 ENCOUNTER — Telehealth: Payer: Self-pay | Admitting: Internal Medicine

## 2024-04-18 ENCOUNTER — Ambulatory Visit: Payer: Self-pay | Admitting: Gastroenterology

## 2024-04-18 ENCOUNTER — Telehealth: Payer: Self-pay

## 2024-04-18 NOTE — Telephone Encounter (Signed)
 Spoke with pt wife Aldona who is frustrated and concerned about the coding for the upcoming procedures. Pt wife stated that the pt procedure should have been coded as preventive instead of diagnostic. Chart was reviewed and noted that pt has a history of polyps and symptoms of nausea and GERD. Pt is scheduled for an EGD/Colonoscopy on 04/21/2024 at 10:00 am  with Dr. Albertus.  Aldona is requesting that Dr. Albertus change the procedure to preventive. Please review and advise.   Aldona is also  requesting to know how much the procedures cost.  Routed to pre certification team for procedure cost.

## 2024-04-18 NOTE — Telephone Encounter (Signed)
-----   Message from April M sent at 04/18/2024  8:48 AM EST ----- Regarding: Colonoscopy Patient's wife would like a call back as to why Dr. Albertus is not coding his colonoscopy as preventive. I have already explained to her, once a polyp is removed, it changes how the procedure is coded. She was very upset and would like to speak to a nurse.  [phone]  Thank you.

## 2024-04-18 NOTE — Telephone Encounter (Signed)
 Patient called requesting a call to discuss coding of colonoscopy and out of pocket cost for 1/23 colonoscopy. Requesting a call back to wife at [phone]. Please advise, thank you

## 2024-04-18 NOTE — Telephone Encounter (Signed)
 I have copied Joen Molt who may be able to help your from a billing or coding perspective Upper endoscopy is never screening and is always diagnostic based on symptoms Colonoscopy is preventative but he has a history of adenomatous (precancerous type) polyps.  This is routine surveillance and screening.  It will be coded as history of polyps because that is the reason for the earlier test.  Some insurance companies played a game where this is no longer preventative.  This is really an insurance decision not a medical one. I want to be as helpful as possible but have to code this appropriately based on his medical history JMP

## 2024-04-19 NOTE — Telephone Encounter (Signed)
 I spoke with the patient's wife today.  I explained that an EGD was not a screening or preventative test and would always be considered diagnostic.  Patient's wife verbalized understanding and requested if EGD was not preventative they would like to cancel the EGD portion of the procedure.  We discussed the colonoscopy would be a high risk screening due to a hx of polyps. I advised her to contact her insurance company and ask how they will pay for a surveillance screening.  I stressed that this is not preventative, as that happens only once every 10 years, if there were no polyps found.  I advised that there is a hx of polyps and have to disclose this in our billing.  Patient's wife verbalized understanding and stated she will call the insurance company. I explained that it would be up to the insurance company to determine how they will accept the claim and assign payment.  I advised her that they could treat this the same as a diagnostic and apply the amount to his deductible.  She went on to say she too has a hx of polyps,  and her insurance covered it at 100%.  They would like to keep the colonoscopy as scheduled.  I changed the appointment to a colonoscopy.  She thanked me for the call.

## 2024-04-21 ENCOUNTER — Ambulatory Visit: Admitting: Internal Medicine

## 2024-04-21 ENCOUNTER — Encounter: Payer: Self-pay | Admitting: Internal Medicine

## 2024-04-21 VITALS — BP 110/70 | HR 55 | Temp 97.9°F | Resp 13 | Ht 71.0 in | Wt 178.0 lb

## 2024-04-21 DIAGNOSIS — D122 Benign neoplasm of ascending colon: Secondary | ICD-10-CM

## 2024-04-21 DIAGNOSIS — K635 Polyp of colon: Secondary | ICD-10-CM

## 2024-04-21 DIAGNOSIS — D120 Benign neoplasm of cecum: Secondary | ICD-10-CM | POA: Diagnosis not present

## 2024-04-21 DIAGNOSIS — Z860100 Personal history of colon polyps, unspecified: Secondary | ICD-10-CM

## 2024-04-21 DIAGNOSIS — K648 Other hemorrhoids: Secondary | ICD-10-CM | POA: Diagnosis not present

## 2024-04-21 DIAGNOSIS — Z1211 Encounter for screening for malignant neoplasm of colon: Secondary | ICD-10-CM | POA: Diagnosis present

## 2024-04-21 DIAGNOSIS — Z860101 Personal history of adenomatous and serrated colon polyps: Secondary | ICD-10-CM

## 2024-04-21 MED ORDER — SODIUM CHLORIDE 0.9 % IV SOLN: 500.0000 mL | Freq: Once | INTRAVENOUS | Status: DC

## 2024-04-21 NOTE — Progress Notes (Signed)
 Report to PACU, RN, vss, BBS= Clear.

## 2024-04-21 NOTE — Op Note (Signed)
 Jacob [name] Patient Name: Jacob [name] Procedure Date: 04/21/2024 11:04 AM MRN: [id number] Endoscopist: Gordy CHRISTELLA Starch , MD, [id number] Age: 59 Referring MD:  Date of Birth: [birth year] Gender: Male Account #: [id number] Procedure:                Colonoscopy Indications:              High risk colon cancer surveillance: Personal                            history of multiple adenomas, Last colonoscopy:                            October 2022 (index exam, TA x 5) Medicines:                Monitored Anesthesia Care Procedure:                Pre-Anesthesia Assessment:                           - Prior to the procedure, a History and Physical                            was performed, and patient medications and                            allergies were reviewed. The patient's tolerance of                            previous anesthesia was also reviewed. The risks                            and benefits of the procedure and the sedation                            options and risks were discussed with the patient.                            All questions were answered, and informed consent                            was obtained. Prior Anticoagulants: The patient has                            taken no anticoagulant or antiplatelet agents. ASA                            Grade Assessment: II - A patient with mild systemic                            disease. After reviewing the risks and benefits,                            the patient was deemed in satisfactory condition to  undergo the procedure.                           After obtaining informed consent, the colonoscope                            was passed under direct vision. Throughout the                            procedure, the patient's blood pressure, pulse, and                            oxygen saturations were monitored continuously. The                            CF [id number] #[id number] was introduced  through the anus                            and advanced to the cecum, identified by                            appendiceal orifice and ileocecal valve. The                            colonoscopy was performed without difficulty. The                            patient tolerated the procedure well. The quality                            of the bowel preparation was good. The ileocecal                            valve, appendiceal orifice, and rectum were                            photographed. Scope In: 11:10:17 AM Scope Out: 11:23:29 AM Scope Withdrawal Time: 0 hours 10 minutes 22 seconds  Total Procedure Duration: 0 hours 13 minutes 12 seconds  Findings:                 The digital rectal exam was normal.                           A 2 mm polyp was found in the cecum. The polyp was                            sessile. The polyp was removed with a cold snare.                            Resection and retrieval were complete.                           A 4 mm polyp was found in the ascending colon. The  polyp was sessile. The polyp was removed with a                            cold snare. Resection and retrieval were complete.                           Internal hemorrhoids were found during                            retroflexion. The hemorrhoids were small.                           The exam was otherwise without abnormality. Complications:            No immediate complications. Estimated Blood Loss:     Estimated blood loss: none. Impression:               - One 2 mm polyp in the cecum, removed with a cold                            snare. Resected and retrieved.                           - One 4 mm polyp in the ascending colon, removed                            with a cold snare. Resected and retrieved.                           - Small internal hemorrhoids.                           - The examination was otherwise normal. Recommendation:           - Patient  has a contact number available for                            emergencies. The signs and symptoms of potential                            delayed complications were discussed with the                            patient. Return to normal activities tomorrow.                            Written discharge instructions were provided to the                            patient.                           - Resume previous diet.                           - Continue present medications.                           -  Await pathology results.                           - Repeat colonoscopy in 5 years for surveillance. Gordy CHRISTELLA Starch, MD 04/21/2024 11:26:36 AM This report has been signed electronically.

## 2024-04-21 NOTE — Patient Instructions (Addendum)
Resume previous diet. Continue present medications. Await pathology results. Repeat colonoscopy in 5 years for surveillance.  YOU HAD AN ENDOSCOPIC PROCEDURE TODAY AT THE [facility]:   Refer to the procedure report that was given to you for any specific questions about what was found during the examination.  If the procedure report does not answer your questions, please call your gastroenterologist to clarify.  If you requested that your care partner not be given the details of your procedure findings, then the procedure report has been included in a sealed envelope for you to review at your convenience later.  YOU SHOULD EXPECT: Some feelings of bloating in the abdomen. Passage of more gas than usual.  Walking can help get rid of the air that was put into your GI tract during the procedure and reduce the bloating. If you had a lower endoscopy (such as a colonoscopy or flexible sigmoidoscopy) you may notice spotting of blood in your stool or on the toilet paper. If you underwent a bowel prep for your procedure, you may not have a normal bowel movement for a few days.  Please Note:  You might notice some irritation and congestion in your nose or some drainage.  This is from the oxygen used during your procedure.  There is no need for concern and it should clear up in a day or so.  SYMPTOMS TO REPORT IMMEDIATELY:  Following lower endoscopy (colonoscopy or flexible sigmoidoscopy):  Excessive amounts of blood in the stool  Significant tenderness or worsening of abdominal pains  Swelling of the abdomen that is new, acute  Fever of 100F or higher   For urgent or emergent issues, a gastroenterologist can be reached at any hour by calling [phone]. Do not use MyChart messaging for urgent concerns.    DIET:  We do recommend a small meal at first, but then you may proceed to your regular diet.  Drink plenty of fluids but you should avoid alcoholic beverages for 24  hours.  ACTIVITY:  You should plan to take it easy for the rest of today and you should NOT DRIVE or use heavy machinery until tomorrow (because of the sedation medicines used during the test).    FOLLOW UP: Our staff will call the number listed on your records the next business day following your procedure.  We will call around 7:15- 8:00 am to check on you and address any questions or concerns that you may have regarding the information given to you following your procedure. If we do not reach you, we will leave a message.     If any biopsies were taken you will be contacted by phone or by letter within the next 1-3 weeks.  Please call us at [phone] if you have not heard about the biopsies in 3 weeks.    SIGNATURES/CONFIDENTIALITY: You and/or your care partner have signed paperwork which will be entered into your electronic medical record.  These signatures attest to the fact that that the information above on your After Visit Summary has been reviewed and is understood.  Full responsibility of the confidentiality of this discharge information lies with you and/or your care-partner.

## 2024-04-21 NOTE — Progress Notes (Signed)
 Called to room to assist during endoscopic procedure.  Patient ID and intended procedure confirmed with present staff. Received instructions for my participation in the procedure from the performing physician.

## 2024-04-21 NOTE — Progress Notes (Signed)
 "   GASTROENTEROLOGY PROCEDURE H&P NOTE   Primary Care Physician: Norleen Lynwood ORN, MD    Reason for Procedure:  History of adenomatous colonic polyps  Plan:    Colonoscopy  Patient is appropriate for endoscopic procedure(s) in the ambulatory ([facility]) setting.  The nature of the procedure, as well as the risks, benefits, and alternatives were carefully and thoroughly reviewed with the patient. Ample time for discussion and questions allowed.  All questions were answered. The patient understood, was satisfied, and agreed with the plan to proceed.    HPI: Jacob [name] is a 59 y.o. male who presents for surveillance colonoscopy.  Medical history as below.  Tolerated the prep.  No recent chest pain or shortness of breath.  No abdominal pain today.  Past Medical History:  Diagnosis Date   Allergic rhinitis    Anxiety    Arthritis    Cholesteatoma of right ear    Diabetes mellitus (HCC)    bordreline   Dysuria    Enlarged prostate    pt unaware   GERD (gastroesophageal reflux disease)    Headache    Hearing loss    Right ear   Hernia of abdominal wall    History of gross hematuria    History of head injury    History of kidney stones    History of palpitations    Hyperlipidemia 10/18/2014   Hypertension    Insomnia    Lack of concentration    Nasal fracture    Several   Otitis media 11/10/2017   Bilateral   Panic attacks    Syncope    Vertigo     Past Surgical History:  Procedure Laterality Date   CHOLECYSTECTOMY     CYSTOSCOPY N/A 05/10/2018   Procedure: CYSTOSCOPY WITH EXAM UNDER ANESTHESIA;  Surgeon: Devere Lonni Righter, MD;  Location: [facility];  Service: Urology;  Laterality: N/A;  ONLY NEEDS 30 MIN   CYSTOSCOPY N/A 07/17/2022   Procedure: CYSTOSCOPY, BILATERAL RETROGRADE PYELOGRAM;  Surgeon: Carolee [facility] JONETTA DOUGLAS, MD;  Location: [facility];  Service: Urology;  Laterality: N/A;   HYPOSPADIAS CORRECTION N/A 1978   INNER EAR  SURGERY     right   LUMBAR DISC SURGERY     MASTOIDECTOMY Right    NOSE SURGERY     Fracture   tubes in bil ears     UPPER GASTROINTESTINAL ENDOSCOPY     UPPER GI ENDOSCOPY  01/09/2012   Esophageal dilation    Prior to Admission medications  Medication Sig Start Date End Date Taking? Authorizing Provider  ALPRAZolam  (XANAX ) 0.5 MG tablet TAKE ONE TO TWO TABLETS BY MOUTH AT BEDTIME AS NEEDED 02/07/24  Yes Norleen Lynwood ORN, MD  amLODipine  (NORVASC ) 5 MG tablet Take 1 tablet (5 mg total) by mouth daily. 10/06/23 10/05/24 Yes Norleen Lynwood ORN, MD  aspirin  EC 81 MG tablet Take 1 tablet (81 mg total) by mouth daily. 07/23/21  Yes Norleen Lynwood ORN, MD  FLUoxetine  (PROZAC ) 40 MG capsule Take 1 capsule (40 mg total) by mouth daily. 10/06/23  Yes Norleen Lynwood ORN, MD  JARDIANCE  25 MG TABS tablet TAKE 1 TABLET BY MOUTH DAILY BEFORE BREAKFAST 04/14/24  Yes Norleen Lynwood ORN, MD  metFORMIN  (GLUCOPHAGE -XR) 500 MG 24 hr tablet TAKE 4 TABLETS BY MOUTH ONCE DAILY WITH BREAKFAST 10/06/23  Yes Norleen Lynwood ORN, MD  Omega-3 Fatty Acids (FISH OIL PO) Take 1 capsule by mouth daily.   Yes [provider]  pantoprazole  (PROTONIX ) 40 MG tablet Take 1 tablet (40 mg total) by mouth 2 (two) times daily. 10/06/23  Yes Norleen Lynwood ORN, MD  ramipril  (ALTACE ) 10 MG capsule TAKE 1 CAPSULE BY MOUTH DAILY 03/31/24  Yes Norleen Lynwood ORN, MD  repaglinide  (PRANDIN ) 0.5 MG tablet Take 1 tablet (0.5 mg total) by mouth 2 (two) times daily before a meal. 10/26/23  Yes Norleen Lynwood ORN, MD  rosuvastatin  (CRESTOR ) 10 MG tablet Take 1 tablet (10 mg total) by mouth daily. 10/06/23  Yes Norleen Lynwood ORN, MD  clobetasol cream (TEMOVATE) 0.05 % Apply topically 2 (two) times daily. 02/27/23   [provider]  cyanocobalamin  (VITAMIN B12) 1000 MCG tablet Take 1 tablet (1,000 mcg total) by mouth daily. 12/11/21   Norleen Lynwood ORN, MD  cyclobenzaprine  (FLEXERIL ) 5 MG tablet Take 5 mg by mouth at bedtime. Patient not taking: Reported on 04/21/2024    [provider]   EQ ARTHRITIS PAIN RELIEVER 1 % GEL Apply topically 4 (four) times daily as needed. 03/12/23   [provider]  meclizine  (ANTIVERT ) 25 MG tablet Take 25 mg by mouth. Patient not taking: No sig reported 05/12/23   [provider]  meloxicam  (MOBIC ) 15 MG tablet Take 1 tablet (15 mg total) by mouth daily as needed. Patient not taking: No sig reported 10/26/23   Norleen Lynwood ORN, MD  metoprolol  tartrate (LOPRESSOR ) 25 MG tablet Take 1 tablet (25 mg total) by mouth 2 (two) times daily. Patient not taking: Reported on 02/23/2024 10/06/23 02/23/24  Norleen Lynwood ORN, MD  Multiple Vitamin (MULTIVITAMIN WITH MINERALS) TABS tablet Take 1 tablet by mouth daily.    [provider]  sucralfate  (CARAFATE ) 1 g tablet Take 1 tablet (1 g total) by mouth 4 (four) times daily -  with meals and at bedtime. 10/06/23   Norleen Lynwood ORN, MD  tadalafil  (CIALIS ) 20 MG tablet Take 1 tablet (20 mg total) by mouth daily as needed for erectile dysfunction. 03/08/23   Norleen Lynwood ORN, MD  traMADol  (ULTRAM ) 50 MG tablet Take 50 mg by mouth. Patient not taking: No sig reported 04/29/23   [provider]  ZORYVE 0.3 % CREA Apply topically. 06/01/23   [provider]    Current Outpatient Medications  Medication Sig Dispense Refill   ALPRAZolam  (XANAX ) 0.5 MG tablet TAKE ONE TO TWO TABLETS BY MOUTH AT BEDTIME AS NEEDED 60 tablet 2   amLODipine  (NORVASC ) 5 MG tablet Take 1 tablet (5 mg total) by mouth daily. 90 tablet 3   aspirin  EC 81 MG tablet Take 1 tablet (81 mg total) by mouth daily. 90 tablet 11   FLUoxetine  (PROZAC ) 40 MG capsule Take 1 capsule (40 mg total) by mouth daily. 90 capsule 3   JARDIANCE  25 MG TABS tablet TAKE 1 TABLET BY MOUTH DAILY BEFORE BREAKFAST 90 tablet 0   metFORMIN  (GLUCOPHAGE -XR) 500 MG 24 hr tablet TAKE 4 TABLETS BY MOUTH ONCE DAILY WITH BREAKFAST 360 tablet 3   Omega-3 Fatty Acids (FISH OIL PO) Take 1 capsule by mouth daily.     pantoprazole  (PROTONIX ) 40 MG tablet Take 1  tablet (40 mg total) by mouth 2 (two) times daily. 180 tablet 3   ramipril  (ALTACE ) 10 MG capsule TAKE 1 CAPSULE BY MOUTH DAILY 90 capsule 0   repaglinide  (PRANDIN ) 0.5 MG tablet Take 1 tablet (0.5 mg total) by mouth 2 (two) times daily before a meal. 180 tablet 3   rosuvastatin  (CRESTOR ) 10 MG tablet Take 1 tablet (  10 mg total) by mouth daily. 90 tablet 3   clobetasol cream (TEMOVATE) 0.05 % Apply topically 2 (two) times daily.     cyanocobalamin  (VITAMIN B12) 1000 MCG tablet Take 1 tablet (1,000 mcg total) by mouth daily. 90 tablet 1   cyclobenzaprine  (FLEXERIL ) 5 MG tablet Take 5 mg by mouth at bedtime. (Patient not taking: Reported on 04/21/2024)     EQ ARTHRITIS PAIN RELIEVER 1 % GEL Apply topically 4 (four) times daily as needed.     meclizine  (ANTIVERT ) 25 MG tablet Take 25 mg by mouth. (Patient not taking: No sig reported)     meloxicam  (MOBIC ) 15 MG tablet Take 1 tablet (15 mg total) by mouth daily as needed. (Patient not taking: No sig reported) 90 tablet 1   metoprolol  tartrate (LOPRESSOR ) 25 MG tablet Take 1 tablet (25 mg total) by mouth 2 (two) times daily. (Patient not taking: Reported on 02/23/2024) 180 tablet 3   Multiple Vitamin (MULTIVITAMIN WITH MINERALS) TABS tablet Take 1 tablet by mouth daily.     sucralfate  (CARAFATE ) 1 g tablet Take 1 tablet (1 g total) by mouth 4 (four) times daily -  with meals and at bedtime. 120 tablet 0   tadalafil  (CIALIS ) 20 MG tablet Take 1 tablet (20 mg total) by mouth daily as needed for erectile dysfunction. 15 tablet 11   traMADol  (ULTRAM ) 50 MG tablet Take 50 mg by mouth. (Patient not taking: No sig reported)     ZORYVE 0.3 % CREA Apply topically.     Current Facility-Administered Medications  Medication Dose Route Frequency Provider Last Rate Last Admin   0.9 %  sodium chloride  infusion  500 mL Intravenous Once Kyasia Steuck, Gordy HERO, MD        Allergies as of 04/21/2024   (No Known Allergies)    Family History  Problem Relation Age of Onset    COPD Mother    Hypertension Mother    Anxiety disorder Mother    Heart disease Father    Cancer Father    Diabetes Paternal Grandmother    Diabetes Maternal Grandmother    Anxiety disorder Maternal Grandmother    Heart attack Brother    Lung cancer Maternal Uncle    Anxiety disorder Maternal Uncle    Anxiety disorder Maternal Aunt    OCD Maternal Aunt    Anxiety disorder Cousin    ADD / ADHD Son    Drug abuse Son    Colon cancer Neg Hx    Esophageal cancer Neg Hx    Rectal cancer Neg Hx    Stomach cancer Neg Hx     Social History   Socioeconomic History   Marital status: Single    Spouse name: Not on file   Number of children: 3   Years of education: Not on file   Highest education level: 12th grade  Occupational History   Occupation: Sports Administrator: [employer]  Tobacco Use   Smoking status: Never   Smokeless tobacco: Never  Vaping Use   Vaping status: Never Used  Substance and Sexual Activity   Alcohol use: Yes    Alcohol/week: 5.0 standard drinks of alcohol    Types: 5 Glasses of wine per week    Comment: weekends   Drug use: No   Sexual activity: Yes    Partners: Female    Birth control/protection: None  Other Topics Concern   Not on file  Social History Narrative   Not on file  Social Drivers of Health   Tobacco Use: Low Risk (04/21/2024)   Patient History    Smoking Tobacco Use: Never    Smokeless Tobacco Use: Never    Passive Exposure: Not on file  Financial Resource Strain: Low Risk (11/30/2022)   Received from [facility]   Overall Financial Resource Strain (CARDIA)    Difficulty of Paying Living Expenses: Not very hard  Food Insecurity: Food Insecurity Present (11/30/2022)   Received from [facility]   Epic    Within the past 12 months, you worried that your food would run out before you got the money to buy more.: Never true    Within the past 12 months, the food you bought just didn't last and you didn't have money to get more.:  Sometimes true  Transportation Needs: No Transportation Needs (11/30/2022)   Received from [facility] - Transportation    Lack of Transportation (Medical): No    Lack of Transportation (Non-Medical): No  Physical Activity: Insufficiently Active (11/30/2022)   Received from [facility]   Exercise Vital Sign    On average, how many days per week do you engage in moderate to strenuous exercise (like a brisk walk)?: 2 days    On average, how many minutes do you engage in exercise at this level?: 20 min  Stress: No Stress Concern Present (11/30/2022)   Received from [facility] - Occupational Stress Questionnaire    Feeling of Stress : Only a little  Social Connections: Moderately Integrated (11/30/2022)   Received from [facility]   Social Network    How would you rate your social network (family, work, friends)?: Adequate participation with social networks  Intimate Partner Violence: Not At Risk (12/21/2023)   Received from [facility]   HITS    Over the last 12 months how often did your partner physically hurt you?: Never    Over the last 12 months how often did your partner insult you or talk down to you?: Never    Over the last 12 months how often did your partner threaten you with physical harm?: Never    Over the last 12 months how often did your partner scream or curse at you?: Never  Depression (PHQ2-9): Low Risk (10/06/2023)   Depression (PHQ2-9)    PHQ-2 Score: 0  Alcohol Screen: Low Risk (08/03/2022)   Alcohol Screen    Last Alcohol Screening Score (AUDIT): 5  Housing: Low Risk (08/03/2022)   Housing    Last Housing Risk Score: 0  Utilities: Not At Risk (11/30/2022)   Received from [facility] Utilities    Threatened with loss of utilities: No  Health Literacy: Not on file    Physical Exam: Vital signs in last 24 hours: @BP  125/77   Pulse (!) 56   Temp 97.9 F (36.6 C)   Ht 5' 11 (1.803 m)   Wt 178 lb  (80.7 kg)   SpO2 98%   BMI 24.83 kg/m  GEN: NAD EYE: Sclerae anicteric ENT: MMM CV: Non-tachycardic Pulm: CTA b/l GI: Soft, NT/ND NEURO:  Alert & Oriented x 3   Gordy Starch, MD [facility]  04/21/2024 11:04 AM  "

## 2024-04-21 NOTE — Progress Notes (Signed)
 Pt's states no medical or surgical changes since previsit or office visit.

## 2024-04-25 ENCOUNTER — Telehealth: Payer: Self-pay | Admitting: *Deleted

## 2024-04-25 NOTE — Telephone Encounter (Signed)
 No answer on follow up call. Left message.

## 2024-04-26 ENCOUNTER — Ambulatory Visit: Payer: Self-pay | Admitting: Internal Medicine

## 2024-04-26 LAB — SURGICAL PATHOLOGY
